# Patient Record
Sex: Female | Born: 1965
Health system: Southern US, Community
[De-identification: ages and names within clinical notes are randomized; demographics above are authoritative.]

## PROBLEM LIST (undated history)

## (undated) DIAGNOSIS — IMO0001 Reserved for inherently not codable concepts without codable children: Secondary | ICD-10-CM

## (undated) DIAGNOSIS — R011 Cardiac murmur, unspecified: Secondary | ICD-10-CM

## (undated) DIAGNOSIS — E785 Hyperlipidemia, unspecified: Secondary | ICD-10-CM

## (undated) DIAGNOSIS — R51 Headache: Secondary | ICD-10-CM

## (undated) DIAGNOSIS — Z972 Presence of dental prosthetic device (complete) (partial): Secondary | ICD-10-CM

## (undated) DIAGNOSIS — F329 Major depressive disorder, single episode, unspecified: Secondary | ICD-10-CM

## (undated) DIAGNOSIS — D649 Anemia, unspecified: Secondary | ICD-10-CM

## (undated) DIAGNOSIS — F419 Anxiety disorder, unspecified: Secondary | ICD-10-CM

## (undated) DIAGNOSIS — M1990 Unspecified osteoarthritis, unspecified site: Secondary | ICD-10-CM

## (undated) DIAGNOSIS — M25561 Pain in right knee: Secondary | ICD-10-CM

## (undated) DIAGNOSIS — T7840XA Allergy, unspecified, initial encounter: Secondary | ICD-10-CM

## (undated) DIAGNOSIS — K279 Peptic ulcer, site unspecified, unspecified as acute or chronic, without hemorrhage or perforation: Secondary | ICD-10-CM

## (undated) DIAGNOSIS — I4891 Unspecified atrial fibrillation: Secondary | ICD-10-CM

## (undated) DIAGNOSIS — R7303 Prediabetes: Secondary | ICD-10-CM

## (undated) DIAGNOSIS — I739 Peripheral vascular disease, unspecified: Secondary | ICD-10-CM

## (undated) DIAGNOSIS — O009 Unspecified ectopic pregnancy without intrauterine pregnancy: Secondary | ICD-10-CM

## (undated) DIAGNOSIS — R519 Headache, unspecified: Secondary | ICD-10-CM

## (undated) DIAGNOSIS — Z973 Presence of spectacles and contact lenses: Secondary | ICD-10-CM

## (undated) DIAGNOSIS — H269 Unspecified cataract: Secondary | ICD-10-CM

## (undated) DIAGNOSIS — F319 Bipolar disorder, unspecified: Secondary | ICD-10-CM

## (undated) DIAGNOSIS — G4700 Insomnia, unspecified: Secondary | ICD-10-CM

## (undated) DIAGNOSIS — F32A Depression, unspecified: Secondary | ICD-10-CM

## (undated) DIAGNOSIS — J189 Pneumonia, unspecified organism: Secondary | ICD-10-CM

## (undated) DIAGNOSIS — K760 Fatty (change of) liver, not elsewhere classified: Secondary | ICD-10-CM

## (undated) HISTORY — DX: Hyperlipidemia, unspecified: E78.5

## (undated) HISTORY — PX: BACK SURGERY: SHX140

## (undated) HISTORY — PX: MULTIPLE TOOTH EXTRACTIONS: SHX2053

## (undated) HISTORY — DX: Anemia, unspecified: D64.9

## (undated) HISTORY — PX: TONSILLECTOMY: SUR1361

## (undated) HISTORY — DX: Peripheral vascular disease, unspecified: I73.9

## (undated) HISTORY — DX: Unspecified atrial fibrillation: I48.91

## (undated) HISTORY — DX: Unspecified ectopic pregnancy without intrauterine pregnancy: O00.90

## (undated) HISTORY — DX: Bipolar disorder, unspecified: F31.9

## (undated) HISTORY — DX: Pain in right knee: M25.561

## (undated) HISTORY — DX: Anxiety disorder, unspecified: F41.9

## (undated) HISTORY — PX: NOSE SURGERY: SHX723

## (undated) HISTORY — DX: Allergy, unspecified, initial encounter: T78.40XA

## (undated) HISTORY — DX: Peptic ulcer, site unspecified, unspecified as acute or chronic, without hemorrhage or perforation: K27.9

## (undated) HISTORY — PX: SHOULDER SURGERY: SHX246

## (undated) HISTORY — PX: TUBAL LIGATION: SHX77

## (undated) HISTORY — DX: Insomnia, unspecified: G47.00

---

## 1998-12-23 ENCOUNTER — Other Ambulatory Visit: Admission: RE | Admit: 1998-12-23 | Discharge: 1998-12-23 | Payer: Self-pay | Admitting: Obstetrics & Gynecology

## 2000-09-06 ENCOUNTER — Ambulatory Visit (HOSPITAL_COMMUNITY): Admission: RE | Admit: 2000-09-06 | Discharge: 2000-09-06 | Payer: Self-pay | Admitting: Gastroenterology

## 2001-04-18 ENCOUNTER — Encounter: Payer: Self-pay | Admitting: Family Medicine

## 2001-04-18 ENCOUNTER — Encounter: Admission: RE | Admit: 2001-04-18 | Discharge: 2001-04-18 | Payer: Self-pay | Admitting: Family Medicine

## 2001-05-30 ENCOUNTER — Other Ambulatory Visit: Admission: RE | Admit: 2001-05-30 | Discharge: 2001-05-30 | Payer: Self-pay | Admitting: Obstetrics & Gynecology

## 2001-07-11 ENCOUNTER — Encounter: Payer: Self-pay | Admitting: Allergy and Immunology

## 2001-07-11 ENCOUNTER — Encounter: Admission: RE | Admit: 2001-07-11 | Discharge: 2001-07-11 | Payer: Self-pay | Admitting: Allergy and Immunology

## 2002-03-15 ENCOUNTER — Ambulatory Visit (HOSPITAL_BASED_OUTPATIENT_CLINIC_OR_DEPARTMENT_OTHER): Admission: RE | Admit: 2002-03-15 | Discharge: 2002-03-15 | Payer: Self-pay | Admitting: Orthopedic Surgery

## 2003-06-29 ENCOUNTER — Encounter: Payer: Self-pay | Admitting: Orthopedic Surgery

## 2003-06-29 ENCOUNTER — Encounter: Admission: RE | Admit: 2003-06-29 | Discharge: 2003-06-29 | Payer: Self-pay | Admitting: Orthopedic Surgery

## 2003-07-27 ENCOUNTER — Encounter: Admission: RE | Admit: 2003-07-27 | Discharge: 2003-07-27 | Payer: Self-pay | Admitting: Orthopedic Surgery

## 2003-07-27 ENCOUNTER — Encounter: Payer: Self-pay | Admitting: Orthopedic Surgery

## 2003-08-01 ENCOUNTER — Ambulatory Visit (HOSPITAL_BASED_OUTPATIENT_CLINIC_OR_DEPARTMENT_OTHER): Admission: RE | Admit: 2003-08-01 | Discharge: 2003-08-01 | Payer: Self-pay | Admitting: Orthopedic Surgery

## 2004-01-14 ENCOUNTER — Ambulatory Visit (HOSPITAL_COMMUNITY): Admission: RE | Admit: 2004-01-14 | Discharge: 2004-01-14 | Payer: Self-pay | Admitting: Gastroenterology

## 2004-01-14 HISTORY — PX: COLONOSCOPY: SHX174

## 2004-01-14 HISTORY — PX: UPPER GASTROINTESTINAL ENDOSCOPY: SHX188

## 2004-01-14 HISTORY — PX: ESOPHAGOGASTRODUODENOSCOPY: SHX1529

## 2004-01-25 ENCOUNTER — Encounter: Admission: RE | Admit: 2004-01-25 | Discharge: 2004-01-25 | Payer: Self-pay | Admitting: Allergy and Immunology

## 2004-09-24 ENCOUNTER — Ambulatory Visit (HOSPITAL_COMMUNITY): Admission: RE | Admit: 2004-09-24 | Discharge: 2004-09-24 | Payer: Self-pay | Admitting: Oral Surgery

## 2005-03-05 ENCOUNTER — Ambulatory Visit (HOSPITAL_BASED_OUTPATIENT_CLINIC_OR_DEPARTMENT_OTHER): Admission: RE | Admit: 2005-03-05 | Discharge: 2005-03-05 | Payer: Self-pay | Admitting: Orthopedic Surgery

## 2005-10-07 ENCOUNTER — Other Ambulatory Visit: Admission: RE | Admit: 2005-10-07 | Discharge: 2005-10-07 | Payer: Self-pay | Admitting: Obstetrics & Gynecology

## 2006-03-11 ENCOUNTER — Encounter: Admission: RE | Admit: 2006-03-11 | Discharge: 2006-03-11 | Payer: Self-pay | Admitting: Orthopedic Surgery

## 2006-04-16 ENCOUNTER — Ambulatory Visit (HOSPITAL_COMMUNITY): Admission: RE | Admit: 2006-04-16 | Discharge: 2006-04-16 | Payer: Self-pay | Admitting: Internal Medicine

## 2006-04-16 ENCOUNTER — Encounter: Payer: Self-pay | Admitting: Vascular Surgery

## 2007-02-08 ENCOUNTER — Emergency Department (HOSPITAL_COMMUNITY): Admission: EM | Admit: 2007-02-08 | Discharge: 2007-02-08 | Payer: Self-pay | Admitting: Family Medicine

## 2007-06-29 ENCOUNTER — Inpatient Hospital Stay (HOSPITAL_COMMUNITY): Admission: RE | Admit: 2007-06-29 | Discharge: 2007-06-30 | Payer: Self-pay | Admitting: Orthopedic Surgery

## 2009-03-05 ENCOUNTER — Encounter
Admission: RE | Admit: 2009-03-05 | Discharge: 2009-06-03 | Payer: Self-pay | Admitting: Physical Medicine & Rehabilitation

## 2009-03-08 ENCOUNTER — Ambulatory Visit: Payer: Self-pay | Admitting: Physical Medicine & Rehabilitation

## 2009-04-09 ENCOUNTER — Ambulatory Visit: Payer: Self-pay | Admitting: Physical Medicine & Rehabilitation

## 2009-08-22 ENCOUNTER — Encounter
Admission: RE | Admit: 2009-08-22 | Discharge: 2009-11-20 | Payer: Self-pay | Admitting: Physical Medicine & Rehabilitation

## 2009-08-26 ENCOUNTER — Ambulatory Visit: Payer: Self-pay | Admitting: Physical Medicine & Rehabilitation

## 2009-09-23 ENCOUNTER — Ambulatory Visit: Payer: Self-pay | Admitting: Physical Medicine & Rehabilitation

## 2009-11-25 ENCOUNTER — Encounter
Admission: RE | Admit: 2009-11-25 | Discharge: 2010-02-23 | Payer: Self-pay | Admitting: Physical Medicine & Rehabilitation

## 2009-11-25 ENCOUNTER — Ambulatory Visit: Payer: Self-pay | Admitting: Physical Medicine & Rehabilitation

## 2010-01-06 ENCOUNTER — Ambulatory Visit: Payer: Self-pay | Admitting: Physical Medicine & Rehabilitation

## 2010-01-20 ENCOUNTER — Ambulatory Visit: Payer: Self-pay | Admitting: Physical Medicine & Rehabilitation

## 2010-02-25 ENCOUNTER — Encounter
Admission: RE | Admit: 2010-02-25 | Discharge: 2010-05-26 | Payer: Self-pay | Admitting: Physical Medicine & Rehabilitation

## 2010-02-27 ENCOUNTER — Ambulatory Visit: Payer: Self-pay | Admitting: Physical Medicine & Rehabilitation

## 2010-03-31 ENCOUNTER — Ambulatory Visit: Payer: Self-pay | Admitting: Physical Medicine & Rehabilitation

## 2010-04-24 ENCOUNTER — Ambulatory Visit: Payer: Self-pay | Admitting: Physical Medicine & Rehabilitation

## 2010-06-09 ENCOUNTER — Encounter
Admission: RE | Admit: 2010-06-09 | Discharge: 2010-06-16 | Payer: Self-pay | Admitting: Physical Medicine & Rehabilitation

## 2010-06-16 ENCOUNTER — Ambulatory Visit: Payer: Self-pay | Admitting: Physical Medicine & Rehabilitation

## 2010-07-04 ENCOUNTER — Encounter
Admission: RE | Admit: 2010-07-04 | Discharge: 2010-10-02 | Payer: Self-pay | Admitting: Physical Medicine & Rehabilitation

## 2010-07-14 ENCOUNTER — Ambulatory Visit: Payer: Self-pay | Admitting: Physical Medicine & Rehabilitation

## 2010-10-09 ENCOUNTER — Encounter
Admission: RE | Admit: 2010-10-09 | Discharge: 2010-10-09 | Payer: Self-pay | Source: Home / Self Care | Attending: Physical Medicine & Rehabilitation | Admitting: Physical Medicine & Rehabilitation

## 2010-10-31 ENCOUNTER — Ambulatory Visit: Payer: Self-pay | Admitting: Physical Medicine & Rehabilitation

## 2011-04-07 NOTE — Op Note (Signed)
NAMEGAILE, ALLMON NO.:  [id number]   MEDICAL RECORD NO.:  [id number]          PATIENT TYPE:  OIB   LOCATION:  1610                         FACILITY:  [facility]   PHYSICIAN:  Marlowe Kays, M.D.  DATE OF BIRTH:  Nov 23, 1966   DATE OF PROCEDURE:  06/28/2007  DATE OF DISCHARGE:                               OPERATIVE REPORT   PREOPERATIVE DIAGNOSIS:  Disk bulges, L3-4 and L4-5, with spinal  stenosis at both levels.   POSTOPERATIVE DIAGNOSIS:  Disk bulges, L3-4 and L4-5, with spinal  stenosis at both levels.   OPERATION:  Central foraminal decompression, L3-4 and L4-5.   SURGEON:  Marlowe Kays, M.D.   ASSISTANT:  Georges Lynch. Darrelyn Hillock, M.D.   ANESTHESIA:  General.   PATHOLOGY AND JUSTIFICATION FOR PROCEDURE:  She had a work-related  injury last fall when she slipped and fell.  She has had an injury to  her right shoulder, also has had continued pain into both buttocks and  right thigh initially, subsequently has had some weakness and  dorsiflexion of her right foot.  An MRI has demonstrated disk bulges at  L3-4 and L4-5 with a suggestive of spinal stenosis. A myelogram  performed on May 02, 2007 has demonstrated spinal stenosis at both L3-4  and at L4-5 with poor filling of the axillary root sleeves at L3-4 and  at L4-5.  It was felt that the 2-level decompression of these 2 levels  was indicated based on the myelographic findings and her clinical  picture.   PROCEDURE:  Prophylactic antibiotics, satisfactory general anesthesia,  knee-chest position on the Thomas frame.  With 3 spinal needles,  following a Betadine prep and lateral x-ray, I tentatively located the  L3-4 and L4-5 interspaces.  We then continued draping the back in a  sterile field, time-out performed, Ioban employed, midline incision down  to the 2 spinous processes in the depth of the wound, which were tagged,  and second lateral x-ray confirming that we were at L3 and at L4.  Consequently, I extended the incision somewhat caudad to expose the L5  spinous process as well.  Soft tissue was dissected off the neural  arches from L3 to L5 and 2 self-retaining McCullough retractors placed.  With a double-action rongeur, I removed the spinous process of L4 and a  portion of L3 and L5; I also removed a good bit of the neural arches in  this interval as well.  I then used a combination of 2- and 3-mm  Kerrison rongeurs to begin removing bone and ligamentum flavum and when  the dissection became more difficult, we brought in the microscope to  complete the decompression.  At the conclusion of the decompression, a  hockey-stick was easily placed, both cephalad beneath L3 and caudad  beneath the L5, and the foramina on both sides were widely patent to the  hockey-stick.  Wound was irrigated.  There was no dural tear or other  known complication.  Wound was irrigated with sterile saline and Gelfoam  soaked in thrombin was placed over the dura.  Self-retaining retractors  were removed.  She  was given of 30 mg of Toradol and the wound closed  with interrupted #1 Vicryl in the fascia and paralumbar muscle and deep  subcutaneous tissue, 2-0 Vicryl in the superficial  subcutaneous tissue and staples in the skin.  A dry sterile dressing was  applied.  She was gently placed on her PACU bed and taken to Recovery in  satisfactory condition with no known complications.  Estimated blood  loss was perhaps 150-200 mL, no blood replacement.           ______________________________  Marlowe Kays, M.D.     JA/MEDQ  D:  06/28/2007  T:  06/29/2007  Job:  [id number]
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
## 2011-04-07 NOTE — Assessment & Plan Note (Signed)
Felicia [name] was originally scheduled for an EMG/NCV.  I had her set up  for the test, and she informed me that she actually had an  electrodiagnostic test done recently at another facility.   Felicia [name] also informs me that she is getting rotator cuff surgery or  some type of shoulder surgery by Dr. Ranell Patrick.   In view of the above, I really do not think I have much to offer her.  I  will see her back on a p.r.n. basis.  Overall, I do not think she is a  good candidate for narcotic analgesics on a chronic basis given the  risk/benefit ratio with a past history of bipolar disorder and overall  poor functional outcome in the Workers Comp situation.  Once again, I do  agree with her previously determined MMI status, permanent  restrictions.      Erick Colace, M.D.  Electronically Signed     AEK/MedQ  D:  04/09/2009 15:37:03  T:  04/10/2009 08:11:33  Job #:  [id number]   cc:   Francee Piccolo  [id number].  [place], [place] [address]   Almedia Balls. Ranell Patrick, M.D.  Fax: [phone]   Roe Coombs  [phone]

## 2011-04-10 NOTE — Op Note (Signed)
Felicia [name], Felicia [name]              ACCOUNT NO.:  [id number]   MEDICAL RECORD NO.:  [id number]          PATIENT TYPE:  AMB   LOCATION:  DSC                          FACILITY:  [facility]   PHYSICIAN:  Katy Fitch. Sypher Montez Hageman., M.D.DATE OF BIRTH:  [birth year]   DATE OF PROCEDURE:  03/05/2005  DATE OF DISCHARGE:                                 OPERATIVE REPORT   PREOP DIAGNOSIS:  Chronic left rotator cuff tear involving supraspinatus and  infraspinatus tendons, MRI proven.   POSTOP DIAGNOSIS:  Chronic left rotator cuff tear involving supraspinatus  and infraspinatus tendons, MRI proven, with identification of extensive  synovitis within left glenohumeral joint, rule out an underlying  inflammatory disorder leading to chronic shoulder pain in addition to a  rotator cuff tear predicament.   OPERATION:  1.  Diagnostic arthroscopy, left shoulder followed by arthroscopic      synovectomy and debridement of labral degenerative fragments and deep      surface rotator cuff debridement.  2.  Open reconstruction of supraspinatus and infraspinatus, chronically      retracted tear with decortication of greater tuberosity and placement of      two Arthrex 5.5 mm BioCorkscrew anchors and use of three McLaughlin      through bone mattress sutures to reinsert the supraspinatus and      infraspinatus to the decorticated and lower profile greater tuberosity.      Also, incidental anterolateral acromioplasty. The [facility] joint was      identified and found to be not part of the pathology in my judgment.   OPERATING SURGEON:  Lovey Newcomer, M.D.   ASSISTANT:  Despina Arias, PA-C.   ANESTHESIA:  General endotracheal supplemented by interscalene block,  supervising anesthesiologist Dr. Jacklynn Bue.   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   She has had an extensive history  of rotator cuff disease on the right  shoulder having undergone three repairs of the right rotator cuff in  [place], followed by reconstruction at [facility] for  a total of four procedures.   This would suggest that she has a significant underlying physiologic problem  in her rotator cuff and indeed after lifting during her rehabilitation she  had pain on the left side that was evaluated by her primary care physician  with an MRI documenting a left rotator cuff tear.   She was referred for reconstruction of her left shoulder with our office.   Preoperatively, medical inventory revealed that she had chronic asthma and  was on routine steroid use as an inhaler and had used prednisone  episodically in the past. Despite her chronic obstructive airways disease,  she continues to be a smoker.   These are three significant risk factors for healing.   In general, Felicia [name] appears to be physiologically old for her stated age  and has demonstrated healing problems with her past right shoulder  experience.   Preoperatively, I had a very detailed and frank informed consent in  which I  advised her that in my judgment she should stop smoking and would probably  be at some risk for a breakdown of this repair if she has some type of  underlying problem with her collagen healing due to her asthma and chronic  steroid use.   After carefully highlighting these issues and requesting that she stop  smoking as well as plan on immobilizing herself immediately following  surgery in an effort to prophylax deep vein thrombosis, she is brought to  the operating at this time.   Preoperatively, questions were invited and answered.   PROCEDURE IN DETAIL:  Felicia [name] was brought to operating room and  placed in supine position on the operating table.   Following anesthesia consultation by Dr. Jacklynn Bue, an interscalene block was  placed without complication.   She was  transferred to room eight, placed in supine position on the  operating table, and underwent general orotracheal anesthesia under the  supervision of Dr. Jacklynn Bue and Dr. Gelene Mink.   She was carefully positioned in the beach-chair position with the aid of a  torso and head holder designed for short arthroscopy, followed by  administration of 1 gram of Ancef as IV prophylactic antibiotic.   The left upper extremity and shoulder were prepped with DuraPrep and draped  with impervious arthroscopy drapes.   The shoulder was distended with 20 cc of sterile saline placed through a 20  gauge spinal needle anteriorly. The scope was placed through a standard  posterior viewing portal. Diagnostic arthroscopy revealed abundant synovitis  consistent with a rheumatoid type arthritis in addition to the rotator cuff  tear. This did not appear to be garden variety adhesive capsulitis. There  was extensive degenerative change of the labrum. There was a small partial  biceps tear adjacent to its origin. An anterior portal was created under  direct vision followed by placement of 4.5-mm suction shaver to perform  synovectomy and debridement of the biceps. The deep surface of the rotator  cuff was inspected and found to have a large retracted tear.   The synovium was electrocauterized with an ArthroCare wand brought in  anteriorly followed by use of a suction shaver to debride the cauterized  synovium. Photographic documentation of the synovitis was accomplished with  a digital camera.   The supraspinatus was inspected and found to have a rather large retracted  tear. There was a large bare area noted with granulation tissues present.   The scope was removed and we proceeded directly to an open repair.   A 3 cm incision was fashioned across the anterior acromion and splitting the  deltoid 1.5 cm laterally. The muscle was split at the midline between the anterior middle thirds of the deltoid down to the  bursa which was incised  with scissors. The coracoacromial ligament was left intact. The deep surface  of the [facility] joint was palpated and inspected visually and found not to be  prominent. The coracoacromial ligament did not appear to be a factor in the  cuff tear process. This appeared to be a primary tear due to the  degenerative disease of the cuff and or synovitis in the glenohumeral joint.   The greater tuberosity was decorticated from the biceps interval posteriorly  over 3.5 cm to the deep surface of the teres minor. The margins of the  infraspinatus and supraspinatus were sharply debrided and three McLaughlin  mattress sutures were placed with grasping technique. The McLaughlin sutures  were placed through bone drill  holes tunneling to allow a strong cortical  repair laterally. To BioCorkscrew anchors were placed, one at the middle  section of the supraspinatus and one at the junction of the infraspinatus  and supraspinatus.   A total of four mattress sutures were placed with the BioCorkscrew anchors  after advancing the cuff laterally insetting into the decorticated greater  tuberosity. The mattress sutures from the corkscrew anchors were used to  create a broad footprint of repair against the tuberosity.   A very satisfactory low profile repair was achieved.   The anterior third of the deltoid was then meticulously repaired to the  acromion with through bone through periosteum sutures of #2 FiberWire  followed by repair of the deltoid split with mattress suture of 0-Vicryl.  Satisfactory range of motion was achieved.   The wound was then repaired with subdermal sutures of 2-0 Vicryl and  intradermal 3-0 Prolene suture.   The repair was completed under moderate tension, however, the McLaughlin  sutures were tied over firm cortical bone and should be able to maintain the  advancement of the tendon.   IMPRESSION:  My impression after completing this procedure was is that Ms.   [name] is physiologically older that her stated chronological age with  tissue quality issues probably related to steroids and her chronic smoking.   We may be challenged to obtain a satisfactory healing although our construct  at this time appears to be quite sound.   For aftercare, she will be admitted to the recovery care center for  observation of vital signs and IV antibiotics in the form of intravenous  Ancef 1 gram q.8h x3 doses and appropriate analgesics in the form of p.o.  and intravenous Dilaudid.      RVS/MEDQ  D:  03/05/2005  T:  03/05/2005  Job:  [id number]

## 2011-04-10 NOTE — Op Note (Signed)
[place]  Patient:    DEVITA, NIES Visit Number: [id number] MRN: [id number]          Service Type: DSU Location: [facility] Attending Physician:  Georgena Spurling Dictated by:   Georgena Spurling, M.D. Proc. Date: 03/15/02 Admit Date:  03/15/2002 Discharge Date: 03/15/2002                             Operative Report  PREOPERATIVE DIAGNOSIS:  Right shoulder rotator cuff tear with shoulder impingement and possible intra-articular pathology.  POSTOPERATIVE DIAGNOSIS:  Right shoulder rotator cuff tear with shoulder impingement and possible intra-articular pathology.  OPERATION PERFORMED:  Right shoulder arthroscopy with glenohumeral debridement of frayed labral tearing and undersurface rotator cuff tearing.  Subacromial decompression and miniopen rotator cuff repair.  SURGEON:  Georgena Spurling, M.D.  ASSISTANT:  Jamelle Rushing, P.A.  ANESTHESIA:  General plus interscalene block.  INDICATIONS FOR PROCEDURE:  The patient is a 45 year old white female who has MRI evidence of rotator cuff tearing and has failed conservative treatment. Informed consent was obtained.  DESCRIPTION OF PROCEDURE:  The patient was given an interscalene block in th4e preanesthesia holding area and taken to the operating room and placed in the supine position and administered general LMA anesthesia.  She was then placed in a beach chair position.  The right shoulder was prepped and draped in the usual sterile fashion.  Anterior and posterior portals were created with a #11 blade, blunt trocar and cannula.  Glenohumeral debridement was performed. There was some undersurface tearing of the rotator cuff that was obviously full thickness but better accessible from inside the joint.  We then debrided some frayed labrum and biceps anchor as well.  I then closed the anterior portal and went from the posterior portal up to the subacromial space and created a direct lateral portal  with a #11 blade, blunt trocar and cannula. Performed a bursectomy with a shaver and then an anterolateral acromioplasty with the 5.0 cylindrical bur.  I then debrided the rotator cuff and converted to a miniopen technique.  We used a 15 blade to extend it up to the anterolateral corner of the acromion, split the deltoid in line with its raphe, held in place with an Arthrex shoulder retractor and completed our far lateral bursectomy.  At this point I grabbed the rotator cuff with stay sutures with 0 Vicryls, then used two 5.0 mm Biocorkscrew anchors from Arthrex and placed them in the bare area of the humerus where I had debrided a trough with the arthroscopic bur.  I then placed two vertical mattress and two simple sutures and with some tension, pulled with the stay sutures and tied them down.  I then placed a third anchor in the far posterior portion of the tear and placed a simple and a vertical mattress in that as well.  I then irrigated, closed the deltoid interval with 0 Vicryl, the subcuticular layer with 2-0 Vicryls and Steri-Strips there and nylons in the portals.  Dressed with Adaptic, 4 x 4s, ABDs, 2-inch silk tape, sling and swath.  The patient tolerated the procedure well.  COMPLICATIONS:  None.  DRAINS:  ESTIMATED BLOOD LOSS:  Minimal. Dictated by:   Georgena Spurling, M.D. Attending Physician:  Georgena Spurling DD:  03/15/02 TD:  03/15/02 Job: [id number]

## 2011-04-10 NOTE — Op Note (Signed)
NAME:  Felicia [name], Felicia [name]                        ACCOUNT NO.:  [id number]   MEDICAL RECORD NO.:  [id number]                   PATIENT TYPE:  AMB   LOCATION:  ENDO                                 FACILITY:  [facility]   PHYSICIAN:  Anselmo Rod, M.D.               DATE OF BIRTH:  [birth year]   DATE OF PROCEDURE:  01/14/2004  DATE OF DISCHARGE:                                 OPERATIVE REPORT   PROCEDURE PERFORMED:  Esophagogastroduodenoscopy.   ENDOSCOPIST:  Charna Elizabeth, M.D.   INSTRUMENT USED:  Olympus video panendoscope.   INDICATIONS FOR PROCEDURE:  [age]-year-old white female with a history  of diffuse abdominal pain and guaiac positive stool.  Rule out peptic ulcer  disease, esophagitis, gastritis, etc.   PROCEDURE PREPARATION:  Informed consent was procured from the patient.  The  patient was fasted for eight hours prior to the procedure.   PREPROCEDURE PHYSICAL EXAMINATION:  VITAL SIGNS:  The patient has stable  vital signs.  NECK:  Supple.  CHEST:  Clear to auscultation.  HEART:  S1 and S2 regular.  ABDOMEN:  Soft with normal bowel sounds.   DESCRIPTION OF PROCEDURE:  The patient was placed in the left lateral  decubitus position and sedated with 100 mg of Demerol and 7 mg of Versed in  slow incremental doses.  Once the patient was adequately sedated and  maintained on low flow oxygen and continuous cardiac monitoring, the Olympus  video panendoscope was advanced through the mouthpiece, over the tongue,  into the esophagus under direct vision.  The entire esophagus appeared  normal with no evidence of ring, stricture, masses, esophagitis or Barrett's  mucosa.  The scope was then advanced to the stomach.  Mild atrophic  gastritis was noted.  No erosions, masses, ulcers or polyps were seen.  The  proximal small bowel appeared normal.   IMPRESSION:  Essentially normal esophagogastroduodenoscopy except for mild  atrophic gastritis.   RECOMMENDATIONS:  Proceed with  colonoscopy at this time.  Further  recommendations will be made thereafter.                                               Anselmo Rod, M.D.   JNM/MEDQ  D:  01/14/2004  T:  01/15/2004  Job:  [id number]   cc:   Darl Pikes _______, F.N.P.   [facility]

## 2011-04-10 NOTE — Op Note (Signed)
NAME:  Felicia [name], Felicia [name]                        ACCOUNT NO.:  [id number]   MEDICAL RECORD NO.:  [id number]                   PATIENT TYPE:  AMB   LOCATION:  ENDO                                 FACILITY:  [facility]   PHYSICIAN:  Anselmo Rod, M.D.               DATE OF BIRTH:  03/17/1966   DATE OF PROCEDURE:  01/14/2004  DATE OF DISCHARGE:                                 OPERATIVE REPORT   PROCEDURE:  Colonoscopy.   ENDOSCOPIST:  Charna Elizabeth, M.D.   INSTRUMENT USED:  Olympus video colonoscope.   INDICATIONS FOR PROCEDURE:  Diffuse abdominal pain and guaiac-positive stool  in a 45 year old white female to rule out colonic polyps, masses, etc.   PROCEDURE PERFORMED:  Informed consent was procured from the patient.  The  patient fasted for eight hours prior to the procedure and prepped with a  bottle of magnesium citrate and a gallon of GOLYTELY the night prior to the  procedure.   PREPROCEDURE PHYSICAL EXAMINATION:  VITAL SIGNS:  The patient had stable  vital signs.  NECK:  Supple.  CHEST:  Clear to auscultation.  HEART:  S1 and S2 regular.  ABDOMEN:  Soft with normal bowel sounds.   DESCRIPTION OF PROCEDURE:  The patient was placed in the left lateral  decubitus position, sedated with and additional 20 mg of Demerol and 2 mg of  Versed intravenously.  Once the patient was adequately sedated and  maintained on low flow oxygen and continuous cardiac monitoring, the Olympus  video colonoscope was advanced from the rectum to the cecum and terminal  ileum.  The appendiceal orifice and ileocecal valve were clearly visualized  and photographed.  No masses, polyps, erosions, ulcerations or diverticula  were seen.  The patient had some abdominal discomfort with insufflation of  air into the colon indicating hypersensitivity.  Small internal hemorrhoids  were seen on retroflexion.  The terminal ileum appeared healthy and without  lesions.   IMPRESSION:  1. Normal colonoscopy to the  terminal ileum except for small internal     hemorrhoids.  2. Hypersensitivity noted with insufflation of air into the colon indicating     a component of irritable bowel syndrome.   RECOMMENDATIONS:  1. Continue a high diet with liberal fluid intake.  2. Use antispasmodics as needed.  3. Repeat guaiac testing on an outpatient basis.  4. Repeat CRC screening at the age of 86 and if the patient develops any     abnormal symptoms in the interim.  5. Outpatient followup in the next two weeks for further recommendations.                                               Anselmo Rod, M.D.    JNM/MEDQ  D:  01/14/2004  T:  01/15/2004  Job:  [id number]   cc:   Tomi Bamberger, F.N.P.   [employer]

## 2011-04-10 NOTE — Op Note (Signed)
Felicia [name], Felicia [name] NO.:  [id number]   MEDICAL RECORD NO.:  [id number]          PATIENT TYPE:  OIB   LOCATION:  2899                         FACILITY:  [facility]   PHYSICIAN:  Hewitt Blade, D.D.S.DATE OF BIRTH:  [birth year]   DATE OF PROCEDURE:  09/24/2004  DATE OF DISCHARGE:  09/24/2004                                 OPERATIVE REPORT   PREOPERATIVE DIAGNOSIS:  Nonrestorable and decayed teeth numbers 3, 12, 14,  20, 31.  History of severe asthma and breathing difficulties.   POSTOPERATIVE DIAGNOSIS:  Nonrestorable and decayed teeth numbers 3, 12, 14,  20, 31.  History of severe asthma and breathing difficulties.   OPERATION PERFORMED:  Removal of the above dentition.   SURGEON:  Hewitt Blade, D.D.S.   ASSISTANT:  Konkel.   ANESTHESIA:  General via oral endotracheal intubation.   ESTIMATED BLOOD LOSS:  Less than 25 mL.   FLUIDS REPLACED:  Approximately 1000 mL crystalloid solutions.   COMPLICATIONS:  None apparent.   INDICATIONS FOR PROCEDURE:  [name] is a 45 year old female who was  referred to my office for removal of the deeply decayed teeth.  The teeth  require surgical extraction and due to the patient's history of severe  breathing problems.  It was recommended that the procedure be performed  under general anesthesia in an operating room setting.   DESCRIPTION OF PROCEDURE:  On September 24, 2004, [name] was taken to  [facility] major operating suite where she was placed on the operating room table  in supine position.  Following successful oral endotracheal intubation and  general anesthesia, the patient's face, neck and oral cavity were prepped  and draped in the usual sterile operating room fashion.  The hypopharynx was  suctioned free of fluids and secretions and a moistened 2 inch vaginal pack  was placed as a throat pack.   Attention was then directed intraorally, where approximately 8 mL of 0.5%  Xylocaine containing 1:200,000  epinephrine were infiltrated in the buccal  and palatal gingival tissues around the maxillary teeth and the right and  left inferior alveolar neurovascular regions.  A full thickness  mucoperiosteal flap was then created around the teeth using a #15 Bard  Parker blade.  A flap was elevated approximately 0.5 cm.  The teeth were  then removed from the oral cavity with subluxation from an 11A elevator and  removed using 150 dental forceps.  The bony margin then smoothed using a  Stryker rotary osteotome and a #8 round bur.  The surgical sites were  thoroughly irrigated with  sterile saline irrigating solution and suctioned.  Mucoperiosteal margins  were approximated and sutured in an interrupted fashion using 4-0 chromic  suture material.  The patient was allowed to awaken following removal of the  throat pack and taken to the recovery room without apparent complication.       DC/MEDQ  D:  10/28/2004  T:  10/29/2004  Job:  [id number]

## 2011-06-16 ENCOUNTER — Other Ambulatory Visit: Payer: Self-pay | Admitting: Family Medicine

## 2011-06-16 DIAGNOSIS — R0989 Other specified symptoms and signs involving the circulatory and respiratory systems: Secondary | ICD-10-CM

## 2011-06-17 ENCOUNTER — Encounter: Payer: Self-pay | Admitting: Family Medicine

## 2011-06-17 DIAGNOSIS — K279 Peptic ulcer, site unspecified, unspecified as acute or chronic, without hemorrhage or perforation: Secondary | ICD-10-CM | POA: Insufficient documentation

## 2011-06-17 DIAGNOSIS — E785 Hyperlipidemia, unspecified: Secondary | ICD-10-CM | POA: Insufficient documentation

## 2011-06-19 ENCOUNTER — Ambulatory Visit
Admission: RE | Admit: 2011-06-19 | Discharge: 2011-06-19 | Disposition: A | Discharge status: Home / Self Care | Payer: Medicare Other | Source: Ambulatory Visit | Attending: Family Medicine | Admitting: Family Medicine

## 2011-06-19 DIAGNOSIS — R0989 Other specified symptoms and signs involving the circulatory and respiratory systems: Secondary | ICD-10-CM

## 2011-09-07 LAB — ABO/RH: ABO/RH(D): A POS

## 2011-09-07 LAB — PROTIME-INR: Prothrombin Time: 12.3

## 2011-09-07 LAB — PREGNANCY, URINE: Preg Test, Ur: NEGATIVE

## 2011-09-07 LAB — TYPE AND SCREEN: Antibody Screen: NEGATIVE

## 2011-11-10 ENCOUNTER — Ambulatory Visit: Payer: Medicare Other | Admitting: Physical Therapy

## 2011-11-25 ENCOUNTER — Ambulatory Visit: Payer: Medicare Other | Admitting: Physical Therapy

## 2012-05-03 ENCOUNTER — Other Ambulatory Visit: Payer: Self-pay | Admitting: Family Medicine

## 2012-05-03 DIAGNOSIS — R51 Headache: Secondary | ICD-10-CM

## 2012-05-05 ENCOUNTER — Ambulatory Visit
Admission: RE | Admit: 2012-05-05 | Discharge: 2012-05-05 | Disposition: A | Discharge status: Home / Self Care | Payer: Medicare Other | Source: Ambulatory Visit | Attending: Family Medicine | Admitting: Family Medicine

## 2012-05-05 DIAGNOSIS — R51 Headache: Secondary | ICD-10-CM

## 2012-05-05 MED ORDER — GADOBENATE DIMEGLUMINE 529 MG/ML IV SOLN
13.0000 mL | Freq: Once | INTRAVENOUS | Status: AC | PRN
  Administered 2012-05-05: 13 mL via INTRAVENOUS

## 2012-06-10 ENCOUNTER — Other Ambulatory Visit: Payer: Self-pay

## 2012-06-10 DIAGNOSIS — I70219 Atherosclerosis of native arteries of extremities with intermittent claudication, unspecified extremity: Secondary | ICD-10-CM

## 2012-07-12 ENCOUNTER — Encounter: Payer: Self-pay | Admitting: Vascular Surgery

## 2012-07-13 ENCOUNTER — Encounter (INDEPENDENT_AMBULATORY_CARE_PROVIDER_SITE_OTHER): Payer: BC Managed Care – PPO | Admitting: *Deleted

## 2012-07-13 ENCOUNTER — Ambulatory Visit (INDEPENDENT_AMBULATORY_CARE_PROVIDER_SITE_OTHER): Payer: BC Managed Care – PPO | Admitting: Vascular Surgery

## 2012-07-13 ENCOUNTER — Encounter: Payer: Self-pay | Admitting: Vascular Surgery

## 2012-07-13 VITALS — BP 145/61 | HR 78 | Resp 20 | Ht <= 58 in | Wt 137.0 lb

## 2012-07-13 DIAGNOSIS — I739 Peripheral vascular disease, unspecified: Secondary | ICD-10-CM

## 2012-07-13 DIAGNOSIS — I70219 Atherosclerosis of native arteries of extremities with intermittent claudication, unspecified extremity: Secondary | ICD-10-CM

## 2012-07-13 DIAGNOSIS — Z01810 Encounter for preprocedural cardiovascular examination: Secondary | ICD-10-CM

## 2012-07-13 NOTE — Addendum Note (Signed)
Addended by: Sharee Pimple on: 07/13/2012 01:10 PM   Modules accepted: Orders

## 2012-07-13 NOTE — Assessment & Plan Note (Signed)
Based on the markedly diminished ABIs bilaterally and her physical examination, she has evidence of multilevel arterial occlusive disease. I am unable to palpate femoral pulses and she clearly has aortoiliac occlusive disease. I have discussed with the patient the nature of atherosclerosis, and emphasized the importance of maximal medical management including control of blood pressure, cholesterol levels, antiplatelet agents, obtaining regular exercise, and cessation of smoking. The patient is aware that without maximal medical management the underlying atherosclerotic disease process will progress and also limit the benefit of any interventions. [facility] discussed the importance of a structured walking program however she can only walk a very short distance at this point. I have recommended proceed with CT arteriography to further evaluate the extent of her disease. If it appears that she has disease amenable to angioplasty and stenting then we could potentially consider this. If she is not a candidate for this and would require surgery I would favor an attempt at any conservative approach given that she would be at increased risk for aortofemoral bypass grafting given her obesity. I'll see her back after her CT angiogram.

## 2012-07-13 NOTE — Progress Notes (Signed)
Vascular and Vein Specialist of   Patient name: Felicia [name] MRN: [id number] DOB: [birth year] Sex: female  REASON FOR CONSULT: peripheral vascular disease. Referred by Dr. Georgena Spurling.  HPI: Felicia [name] is a 46 y.o. female was been having significant bilateral lower extremity pain. She experiences pain in her hips thighs and calves bilaterally which is brought on by ambulation and relieved with rest. This has been occurring for over a year. Her symptoms have gradually gotten worse. She also experiences paresthesias in both lower extremities. She denies any history of rest pain and she has had no history of nonhealing wounds. She states that she can barely walk halfway up her driveway before having to stop. He is unable to go grocery shopping with her husband because of her claudication symptoms. She has been worked up for back pain and does have some mild lumbar disc disease.  I reviewed her lumbar spine x-rays which do show some calcium in her aorta.  Past Medical History  Diagnosis Date  . Asthma   . PUD (peptic ulcer disease)   . Anxiety   . Bipolar 1 disorder   . Insomnia   . Hyperlipidemia     Family History  Problem Relation Age of Onset  . Heart disease Mother   . Diabetes Mother   . Hyperlipidemia Mother   . Heart disease Father   . Diabetes Father   . Diabetes Brother     SOCIAL HISTORY: History  Substance Use Topics  . Smoking status: Current Everyday Smoker -- 0.5 packs/day for 30 years    Types: Cigarettes  . Smokeless tobacco: Never Used  . Alcohol Use: No    Allergies  Allergen Reactions  . Cephalosporins   . Codeine Nausea Only  . Doxycycline Hives  . Tetracyclines & Related   . Reflex Pain Relief (Menthol) Rash    Current Outpatient Prescriptions  Medication Sig Dispense Refill  . clonazePAM (KLONOPIN) 1 MG tablet Take 1 mg by mouth 3 (three) times daily as needed.        . cyclobenzaprine (FLEXERIL) 10 MG tablet Take 10 mg by mouth  3 (three) times daily as needed.      . divalproex (DEPAKOTE) 500 MG 24 hr tablet Take 500 mg by mouth daily.        . pravastatin (PRAVACHOL) 80 MG tablet Take 80 mg by mouth daily.      . traMADol (ULTRAM) 50 MG tablet Take 50 mg by mouth every 6 (six) hours as needed.          REVIEW OF SYSTEMS: Felicia [name] ] denotes positive finding; [  ] denotes negative finding  CARDIOVASCULAR:  [ ]  chest pain   [ ]  chest pressure   [ ]  palpitations   [ ]  orthopnea   [ ]  dyspnea on exertion   Felicia [name] ] claudication   Felicia [name] ] rest pain   [ ]  DVT   [ ]  phlebitis PULMONARY:   [ ]  productive cough   Felicia [name] ] asthma   Felicia [name] ] wheezing NEUROLOGIC:   [ ]  weakness  [ ]  paresthesias  [ ]  aphasia  [ ]  amaurosis  [ ]  dizziness HEMATOLOGIC:   [ ]  bleeding problems   [ ]  clotting disorders MUSCULOSKELETAL:  [ ]  joint pain   [ ]  joint swelling [ ]  leg swelling GASTROINTESTINAL: [ ]   blood in stool  [ ]   hematemesis GENITOURINARY:  [ ]   dysuria  [ ]   hematuria PSYCHIATRIC:  [ ]   history of major depression INTEGUMENTARY:  [ ]  rashes  [ ]  ulcers CONSTITUTIONAL:  [ ]  fever   [ ]  chills  PHYSICAL EXAM: Filed Vitals:   07/13/12 1202  BP: 145/61  Pulse: 78  Resp: 20  Height: 4\' 9"  (1.448 m)  Weight: 137 lb (62.143 kg)   Body mass index is 29.65 kg/(m^2). GENERAL: The patient is a well-nourished female, in no acute distress. The vital signs are documented above. CARDIOVASCULAR: There is a regular rate and rhythm without significant murmur appreciated. I do not detect carotid bruits. I cannot palpate femoral pulses. I cannot palpate popliteal or pedal pulses. She has no significant lower extremity swelling. PULMONARY: There is good air exchange bilaterally without wheezing or rales. ABDOMEN: Soft and non-tender with normal pitched bowel sounds. I do not palpate an abdominal aortic aneurysm although it is difficult to evaluate because of her obesity. MUSCULOSKELETAL: There are no major deformities or cyanosis. NEUROLOGIC: No focal weakness  or paresthesias are detected. SKIN: There are no ulcers or rashes noted. PSYCHIATRIC: The patient has a normal affect.  DATA:  I have independently interpreted her arterial Doppler study which shows an ABI of 33% on the right and 29% on the left. She has a monophasic posterior tibial and dorsalis pedis signal bilaterally. I've also interpreted her duplex scan which shows monophasic waveforms in both common femoral arteries. She has turbulent waveforms in the external iliac arteries bilaterally suggestive of proximal disease. This would fit with the finding of calcific disease in her aorta seen on plain films of her spine.  I have reviewed the records from Dr. Tobin Chad office. She does not feel that she has significant disease of her back which would explain her symptoms.  MEDICAL ISSUES:  Peripheral vascular disease Based on the markedly diminished ABIs bilaterally and her physical examination, she has evidence of multilevel arterial occlusive disease. I am unable to palpate femoral pulses and she clearly has aortoiliac occlusive disease. I have discussed with the patient the nature of atherosclerosis, and emphasized the importance of maximal medical management including control of blood pressure, cholesterol levels, antiplatelet agents, obtaining regular exercise, and cessation of smoking. The patient is aware that without maximal medical management the underlying atherosclerotic disease process will progress and also limit the benefit of any interventions. [facility] discussed the importance of a structured walking program however she can only walk a very short distance at this point. I have recommended proceed with CT arteriography to further evaluate the extent of her disease. If it appears that she has disease amenable to angioplasty and stenting then we could potentially consider this. If she is not a candidate for this and would require surgery I would favor an attempt at any conservative approach  given that she would be at increased risk for aortofemoral bypass grafting given her obesity. I'll see her back after her CT angiogram.     DICKSON,CHRISTOPHER S [facility] [place] [facility]per: [phone]

## 2012-07-26 ENCOUNTER — Encounter: Payer: Self-pay | Admitting: Vascular Surgery

## 2012-07-27 ENCOUNTER — Ambulatory Visit (INDEPENDENT_AMBULATORY_CARE_PROVIDER_SITE_OTHER): Payer: Medicare Other | Admitting: Vascular Surgery

## 2012-07-27 ENCOUNTER — Encounter: Payer: Self-pay | Admitting: Vascular Surgery

## 2012-07-27 ENCOUNTER — Ambulatory Visit
Admission: RE | Admit: 2012-07-27 | Discharge: 2012-07-27 | Disposition: A | Discharge status: Home / Self Care | Payer: Medicare Other | Source: Ambulatory Visit | Attending: Vascular Surgery | Admitting: Vascular Surgery

## 2012-07-27 VITALS — BP 134/58 | HR 89 | Resp 16 | Ht <= 58 in | Wt 137.0 lb

## 2012-07-27 DIAGNOSIS — Z01810 Encounter for preprocedural cardiovascular examination: Secondary | ICD-10-CM

## 2012-07-27 DIAGNOSIS — I739 Peripheral vascular disease, unspecified: Secondary | ICD-10-CM

## 2012-07-27 MED ORDER — IOHEXOL 350 MG/ML SOLN
150.0000 mL | Freq: Once | INTRAVENOUS | Status: AC | PRN
  Administered 2012-07-27: 150 mL via INTRAVENOUS

## 2012-07-27 MED ORDER — CLOPIDOGREL BISULFATE 75 MG PO TABS: 75.0000 mg | ORAL_TABLET | Freq: Every day | ORAL | Status: DC

## 2012-07-27 NOTE — Progress Notes (Signed)
Vascular and Vein Specialist of [facility]  Patient name: Felicia [name] MRN: [id number] DOB: 18-Apr-1966 Sex: female  REASON FOR VISIT: follow up of multilevel arterial occlusive disease.  HPI: Felicia [name] is a 46 y.o. female who I saw in consultation on 07/13/2012. She had absent femoral pulses and evidence of multilevel arterial occlusive disease. She was set up for CT angiogram and comes in to discuss these results. She continued to have significant claudication of both lower charities. She gets claudication a very short distance and cannot even grocery shop because of this. She does state that since her last visit she has put away her cigarettes.   REVIEW OF SYSTEMS: [name] ] denotes positive finding; [  ] denotes negative finding  CARDIOVASCULAR:  [ ]  chest pain   [ ]  dyspnea on exertion    CONSTITUTIONAL:  [ ]  fever   [ ]  chills  PHYSICAL EXAM: Filed Vitals:   07/27/12 1439  BP: 134/58  Pulse: 89  Resp: 16  Height: 4\' 9"  (1.448 m)  Weight: 137 lb (62.143 kg)  SpO2: 100%   Body mass index is 29.65 kg/(m^2). GENERAL: The patient is a well-nourished female, in no acute distress. The vital signs are documented above. CARDIOVASCULAR: There is a regular rate and rhythm  PULMONARY: There is good air exchange bilaterally without wheezing or rales. I cannot palpate femoral pulses.  I have reviewed her CT angiogram which shows significant atherosclerotic disease involving the distal aorta and aortic bifurcation including both common iliac arteries. The left common iliac artery appears to have a healed dissection with severe stenosis.  MEDICAL ISSUES:  Peripheral vascular disease CT angio suggest severe disease of the aortic bifurcation and proximal common iliac arteries, especially on the left. The patient has disabling claudication which is significantly limiting her ability to perform her activities of daily living including grocery shopping. Fortunately she has put down her  cigarettes and plans on quitting smoking. We have discussed the option of getting I structured walking program and following her symptoms in trying to avoid any further intervention if at all possible. We've also discussed the option of proceeding with arteriography and proceeding with bilateral iliac angioplasty if she has a disease amenable to this. She has disease in the distal aorta so she would require a kissing balloon technique. She has decided to pursue intervention. I have reviewed with the patient the indications for arteriography. In addition, I have reviewed the potential complications of arteriography including but not limited to: Bleeding, arterial injury, arterial thrombosis, dye action, renal insufficiency, or other unpredictable medical problems. I have explained to the patient that if we find disease amenable to angioplasty we could potentially address this at the same time. I have discussed the potential complications of angioplasty and stenting, including but not limited to: Bleeding, arterial thrombosis, arterial injury, dissection, or the need for surgical intervention. Her procedure is scheduled for 08/22/2012. We will have her start Plavix one-week preoperatively and I have given her a prescription for this.    Amarrion Pastorino S [facility] [place] Beeper: [phone]

## 2012-07-27 NOTE — Assessment & Plan Note (Signed)
CT angio suggest severe disease of the aortic bifurcation and proximal common iliac arteries, especially on the left. The patient has disabling claudication which is significantly limiting her ability to perform her activities of daily living including grocery shopping. Fortunately she has put down her cigarettes and plans on quitting smoking. We have discussed the option of getting I structured walking program and following her symptoms in trying to avoid any further intervention if at all possible. We've also discussed the option of proceeding with arteriography and proceeding with bilateral iliac angioplasty if she has a disease amenable to this. She has disease in the distal aorta so she would require a kissing balloon technique. She has decided to pursue intervention. I have reviewed with the patient the indications for arteriography. In addition, I have reviewed the potential complications of arteriography including but not limited to: Bleeding, arterial injury, arterial thrombosis, dye action, renal insufficiency, or other unpredictable medical problems. I have explained to the patient that if we find disease amenable to angioplasty we could potentially address this at the same time. I have discussed the potential complications of angioplasty and stenting, including but not limited to: Bleeding, arterial thrombosis, arterial injury, dissection, or the need for surgical intervention. Her procedure is scheduled for 08/22/2012. We will have her start Plavix one-week preoperatively and I have given her a prescription for this.

## 2012-08-04 ENCOUNTER — Other Ambulatory Visit: Payer: Self-pay

## 2012-08-05 ENCOUNTER — Encounter (HOSPITAL_COMMUNITY): Payer: Self-pay | Admitting: Pharmacy Technician

## 2012-08-21 MED ORDER — SODIUM CHLORIDE 0.9 % IV SOLN: INTRAVENOUS | Status: DC

## 2012-08-22 ENCOUNTER — Encounter (HOSPITAL_COMMUNITY)
Admission: RE | Disposition: A | Discharge status: Home / Self Care | Payer: Self-pay | Source: Ambulatory Visit | Attending: Vascular Surgery

## 2012-08-22 ENCOUNTER — Ambulatory Visit (HOSPITAL_COMMUNITY)
Admission: RE | Admit: 2012-08-22 | Discharge: 2012-08-22 | Disposition: A | Discharge status: Home / Self Care | Payer: Medicare Other | Source: Ambulatory Visit | Attending: Vascular Surgery | Admitting: Vascular Surgery

## 2012-08-22 SURGERY — ABDOMINAL AORTAGRAM
Anesthesia: LOCAL

## 2012-08-25 ENCOUNTER — Other Ambulatory Visit: Payer: Self-pay

## 2012-08-25 DIAGNOSIS — I745 Embolism and thrombosis of iliac artery: Secondary | ICD-10-CM

## 2012-08-25 MED ORDER — CLOPIDOGREL BISULFATE 75 MG PO TABS: ORAL_TABLET | ORAL | Status: DC

## 2012-08-25 NOTE — Progress Notes (Signed)
Plavix reordered to be started on 09/05/12, one week prior to Aortogram and possible bilateral CIA PTA/Stents.  Pt. made aware of need to start on 09/05/12

## 2012-09-05 ENCOUNTER — Encounter (HOSPITAL_COMMUNITY): Payer: Self-pay | Admitting: Respiratory Therapy

## 2012-09-09 ENCOUNTER — Other Ambulatory Visit: Payer: Self-pay

## 2012-09-12 ENCOUNTER — Encounter (HOSPITAL_COMMUNITY)
Admission: RE | Disposition: A | Discharge status: Home / Self Care | Payer: Self-pay | Source: Ambulatory Visit | Attending: Vascular Surgery

## 2012-09-12 ENCOUNTER — Telehealth: Payer: Self-pay | Admitting: Vascular Surgery

## 2012-09-12 ENCOUNTER — Ambulatory Visit (HOSPITAL_COMMUNITY)
Admission: RE | Admit: 2012-09-12 | Discharge: 2012-09-12 | Disposition: A | Discharge status: Home / Self Care | Payer: Medicare Other | Source: Ambulatory Visit | Attending: Vascular Surgery | Admitting: Vascular Surgery

## 2012-09-12 DIAGNOSIS — F319 Bipolar disorder, unspecified: Secondary | ICD-10-CM | POA: Insufficient documentation

## 2012-09-12 DIAGNOSIS — G4700 Insomnia, unspecified: Secondary | ICD-10-CM | POA: Insufficient documentation

## 2012-09-12 DIAGNOSIS — E785 Hyperlipidemia, unspecified: Secondary | ICD-10-CM | POA: Insufficient documentation

## 2012-09-12 DIAGNOSIS — J45909 Unspecified asthma, uncomplicated: Secondary | ICD-10-CM | POA: Insufficient documentation

## 2012-09-12 DIAGNOSIS — I70219 Atherosclerosis of native arteries of extremities with intermittent claudication, unspecified extremity: Secondary | ICD-10-CM | POA: Insufficient documentation

## 2012-09-12 DIAGNOSIS — F411 Generalized anxiety disorder: Secondary | ICD-10-CM | POA: Insufficient documentation

## 2012-09-12 HISTORY — PX: ABDOMINAL AORTAGRAM: SHX5454

## 2012-09-12 LAB — POCT I-STAT, CHEM 8
BUN: 14 mg/dL (ref 6–23)
Creatinine, Ser: 0.9 mg/dL (ref 0.50–1.10)
Glucose, Bld: 84 mg/dL (ref 70–99)
Hemoglobin: 11.6 g/dL — ABNORMAL LOW (ref 12.0–15.0)
TCO2: 25 mmol/L (ref 0–100)

## 2012-09-12 SURGERY — ABDOMINAL AORTAGRAM
Anesthesia: LOCAL

## 2012-09-12 MED ORDER — OXYCODONE-ACETAMINOPHEN 5-325 MG PO TABS: 1.0000 | ORAL_TABLET | ORAL | Status: DC | PRN

## 2012-09-12 MED ORDER — LIDOCAINE HCL (PF) 1 % IJ SOLN
INTRAMUSCULAR | Status: AC
  Filled 2012-09-12: qty 30

## 2012-09-12 MED ORDER — FENTANYL CITRATE 0.05 MG/ML IJ SOLN
INTRAMUSCULAR | Status: AC
  Filled 2012-09-12: qty 2

## 2012-09-12 MED ORDER — MIDAZOLAM HCL 2 MG/2ML IJ SOLN
INTRAMUSCULAR | Status: AC
  Filled 2012-09-12: qty 2

## 2012-09-12 MED ORDER — MORPHINE SULFATE 4 MG/ML IJ SOLN: 2.0000 mg | INTRAMUSCULAR | Status: DC | PRN

## 2012-09-12 MED ORDER — SODIUM CHLORIDE 0.9 % IV SOLN: INTRAVENOUS | Status: DC

## 2012-09-12 NOTE — H&P (Signed)
[facility] [place]  Brief History and Physical  History of Present Illness  Felicia [name] is a 46 y.o. female who presents with chief complaint: B claudication.  The patient presents today for: aortogram , bilateral leg runoff, possible iliac intervention.  Past Medical History  Diagnosis Date  . Asthma   . PUD (peptic ulcer disease)   . Anxiety   . Bipolar 1 disorder   . Insomnia   . Hyperlipidemia     Past Surgical History  Procedure Date  . Shoulder surgery   . Nose surgery   . Cesarean section   . Nose surgery     History   Social History  . Marital Status: Married    Spouse Name: N/A    Number of Children: N/A  . Years of Education: N/A   Occupational History  . Not on file.   Social History Main Topics  . Smoking status: Former Smoker -- 0.5 packs/day for 30 years    Types: Cigarettes    Quit date: 07/13/2012  . Smokeless tobacco: Never Used  . Alcohol Use: No  . Drug Use: No  . Sexually Active: Not on file   Other Topics Concern  . Not on file   Social History Narrative  . No narrative on file    Family History  Problem Relation Age of Onset  . Heart disease Mother   . Diabetes Mother   . Hyperlipidemia Mother   . Heart disease Father   . Diabetes Father   . Diabetes Brother     No current facility-administered medications on file prior to encounter.   Current Outpatient Prescriptions on File Prior to Encounter  Medication Sig Dispense Refill  . albuterol (PROVENTIL HFA;VENTOLIN HFA) 108 (90 BASE) MCG/ACT inhaler Inhale 2 puffs into the lungs every 6 (six) hours as needed. For shortness of breath      . atorvastatin (LIPITOR) 40 MG tablet Take 40 mg by mouth daily.      . clonazePAM (KLONOPIN) 1 MG tablet Take 1 mg by mouth 3 (three) times daily as needed. anxiety      . clopidogrel (PLAVIX) 75 MG tablet Start taking 75 mg, 1 tablet, by mouth, 1 week prior to procedure; start on 09/05/12  10 tablet  0  .  cyclobenzaprine (FLEXERIL) 10 MG tablet Take 10 mg by mouth 3 (three) times daily as needed. Muscle spasm      . divalproex (DEPAKOTE) 500 MG 24 hr tablet Take 1,000 mg by mouth at bedtime.         Allergies  Allergen Reactions  . Cephalosporins Hives  . Codeine Nausea Only  . Doxycycline Hives  . Tetracyclines & Related Hives  . Reflex Pain Relief (Menthol) Rash    Review of Systems: As listed above, otherwise negative.  Physical Examination  Filed Vitals:   09/12/12 1113 09/12/12 1115  BP: 122/69   Pulse: 92   Temp: 97.1 F (36.2 C)   TempSrc: Oral   Height:  4\' 9"  (1.448 m)  Weight:  135 lb (61.236 kg)  SpO2: 97%     General: A&O x 3, WDWN  Pulmonary: Sym exp, good air movt, CTAB, no rales, rhonchi, & wheezing  Cardiac: RRR, Nl S1, S2, no Murmurs, rubs or gallops  Gastrointestinal: soft, NTND, -G/R, - HSM, - masses, - CVAT B  Musculoskeletal: M/S 5/5 throughout , Extremities without ischemic changes   Laboratory See [facility]  Medical Decision Making  Felicia [name]  is a 46 y.o. female who presents with: bilateral claudication.   The patient is scheduled for: aortogram , bilateral leg runoff, possible iliac intervention. I discussed with the patient the nature of angiographic procedures, especially the limited patencies of any endovascular intervention.  The patient is aware of that the risks of an angiographic procedure include but are not limited to: bleeding, infection, access site complications, renal failure, embolization, rupture of vessel, dissection, possible need for emergent surgical intervention, possible need for surgical procedures to treat the patient's pathology, and stroke and death.    The patient is aware of the risks and agrees to proceed.  Leonides Sake, MD [facility] [place] Office: [phone] Pager: [phone]  09/12/2012, 11:29 AM

## 2012-09-12 NOTE — Op Note (Signed)
OPERATIVE NOTE   PROCEDURE: 1.  Right common femoral artery cannulation under ultrasound guidance 2.  Aortogram 3.  Bilateral leg runoff  PRE-OPERATIVE DIAGNOSIS: Intermittent claudication  POST-OPERATIVE DIAGNOSIS: same as above   SURGEON: Leonides Sake, MD  ANESTHESIA: conscious sedation  ESTIMATED BLOOD LOSS: 30 cc  CONTRAST: 135 cc  FINDING(S):  Aorta: distal stenosis >90%, calcified (too small for kissing stents)  Superior mesenteric artery: not well visualized Celiac artery: patent  Right Left  RA Patent with nephrogram Patent with nephrogram  CIA Patent but underfilled Patent but 50% stenosis, underfilled  EIA Patent, unfilled Patent, unfilled  IIA Patent, unfilled Patent, unfilled  CFA Patent, unfilled Patent, unfilled  SFA Patent, unfilled Patent, unfilled  PFA Patent, unfilled Patent, unfilled  Pop Patent, unfilled Patent, unfilled  Trif Patent, unfilled Patent, unfilled  AT Patent, unfilled Not visualized due flow > table movement  Pero Patent, unfilled Not visualized due flow > table movement  PT Patent, unfilled Not visualized due flow > table movement   SPECIMEN(S):  none  INDICATIONS:   Felicia [name] is a 46 y.o. female who presents with intermittent claudication.  The patient presents for: aortogram, bilateral runoff and possible intervention.  I discussed with the patient the nature of angiographic procedures, especially the limited patencies of any endovascular intervention.  The patient is aware of that the risks of an angiographic procedure include but are not limited to: bleeding, infection, access site complications, renal failure, embolization, rupture of vessel, dissection, possible need for emergent surgical intervention, possible need for surgical procedures to treat the patient's pathology, and stroke and death.  The patient is aware of the risks and agrees to proceed.  DESCRIPTION: After full informed consent was obtained from the patient, the  patient was brought back to the angiography suite.  The patient was placed supine upon the angiography table and connected to monitoring equipment.  The patient was then given conscious sedation, the amounts of which are documented in the patient's chart.  The patient was prepped and drape in the standard fashion for an angiographic procedure.  At this point, attention was turned to the right groin.  Under ultrasound guidance, the right common femoral artery was cannulated with a micropuncture needle.  The microwire was advanced into the iliac arterial system.  The needle was exchanged for a microsheath, which was loaded into the common femoral artery over the wire.  The microwire was exchanged for a [facility] wire which was advanced into the common iliac artery.  The microsheath was then exchanged for a 5-Fr sheath which was loaded into the common femoral artery.  A KMP catheter was loaded over the wire.  The wire was removed and I did a hand injection via the catheter to image the proximal common iliac artery.  The glidewire was placed through the catheter and used to navigate into the aorta.  I advanced the catheter into the suprarenal segment and then exchanged the wire for the [facility] wire.  The catheter was was exchanged for the pigtail catheter.  The catheter was then loaded over the wire up to the level of L1.  The catheter was connected to the power injector circuit.  After de-airring and de-clotting the circuit, a power injector aortogram was completed.  Based on the images, this aorta is not large enough to accommodate two common iliac artery and she will need an aortobifemoral bypass graft.  I pulled the catheter to proximal to the aortic stenosis.  An automated bilateral leg runoff  was completed.  I replaced the wire into the catheter, straightening out the crook in the catheter.  Both were removed from the sheath together. The sheath was aspirated.  No clots were present and the sheath was reloaded with  heparinized saline.    COMPLICATIONS: none  CONDITION: stable   Leonides Sake, MD [facility] [place] Office: [phone] Pager: [phone]  09/12/2012, 1:28 PM

## 2012-09-12 NOTE — Telephone Encounter (Addendum)
Message copied by Shari Prows on Mon Sep 12, 2012  3:23 PM ------      Message from: Melene Plan      Created: Mon Sep 12, 2012  3:08 PM       Good Th[facility] Lolling!!      ----- Message -----         From: Fransisco Hertz, MD         Sent: 09/12/2012   1:36 PM           To: Reuel Derby, Melene Plan, RN            JANSYN MESECHER      [id number]      24-Apr-1966            PROCEDURE:      1.  Right common femoral artery cannulation under ultrasound guidance      2.  Aortogram      3.  Bilateral leg runoff            Follow-up: Dr. Edilia Bo this week            I scheduled an appt on 09/14/12 at 10:30am per above note. Patient is aware of the appt/awt

## 2012-09-13 ENCOUNTER — Encounter: Payer: Self-pay | Admitting: Vascular Surgery

## 2012-09-14 ENCOUNTER — Ambulatory Visit (INDEPENDENT_AMBULATORY_CARE_PROVIDER_SITE_OTHER): Payer: Medicare Other | Admitting: Vascular Surgery

## 2012-09-14 ENCOUNTER — Encounter: Payer: Self-pay | Admitting: Vascular Surgery

## 2012-09-14 VITALS — BP 121/64 | HR 86 | Ht <= 58 in | Wt 151.0 lb

## 2012-09-14 DIAGNOSIS — I70219 Atherosclerosis of native arteries of extremities with intermittent claudication, unspecified extremity: Secondary | ICD-10-CM

## 2012-09-14 NOTE — Assessment & Plan Note (Signed)
Aortogram shows a tight stenosis of the distal aorta. This patient has claudication of both lower charities but no rest pain or nonhealing ulcers. I had a long discussion with the patient and her husband about their options. First option would be to get on a structured walking program and also to work on careful control of her blood pressure and cholesterol. We have had a discussion about the importance of nutrition. She smoking 2 months ago and seems to be doing well with this. Given that she does not have rest pain or nonhealing ulcer I think this option is a reasonable. I have explained that the only real risk with this option would be if the aorta occluded suddenly she could potentially have significant sudden worsening of her symptoms. We have also discussed the surgical options as she was not a candidate for an endovascular approach based on her arteriogram. I discussed aorto right femoral bypass grafting and the potential risks associated with this. I think she would be at slightly increased risk for graft thrombosis given the small size of her arteries. However I do think she is a reasonable candidate for aortobifemoral bypass grafting. If she were to elect to proceed with surgery she would need preoperative cardiac evaluation. Given that she is non-and healthy I do not think she would be a good candidate for axillobifemoral bypass grafting. Currently, the patient wishes to attempt to insert if treatment with a structured walking program and attention to her nutrition and diet. I think this is perfectly reasonable. I'll see her back in 3 months with follow up ABIs. She knows to call sooner if she has problems. Certainly if her symptoms progressed I think she is a good candidate for aortobifemoral bypass grafting.

## 2012-09-14 NOTE — Progress Notes (Signed)
[facility] [place]  Patient name: Felicia [name] MRN: [id number] DOB: September 17, 1966 Sex: female  REASON FOR VISIT: follow up after aortogram  HPI: Felicia [name] is a 46 y.o. female who I been following with claudication. Ultimately she wished to proceed with arteriography and this was performed yesterday by Dr. Osvaldo Angst showed a tight distal aortic stenosis which was not amenable to angioplasty. She comes in today to discuss her results.  He continues to have claudication in both lower extremities involves her hips thighs and calves bilaterally. She can walk approximately 40 feet before experiencing symptoms. She denies rest pain. She denies nonhealing ulcers.   REVIEW OF SYSTEMS: [name] ] denotes positive finding; [  ] denotes negative finding  CARDIOVASCULAR:  [ ]  chest pain   [ ]  dyspnea on exertion    CONSTITUTIONAL:  [ ]  fever   [ ]  chills  PHYSICAL EXAM: Filed Vitals:   09/14/12 1021  BP: 121/64  Pulse: 86  Height: 4\' 9"  (1.448 m)  Weight: 151 lb (68.493 kg)  SpO2: 99%   Body mass index is 32.68 kg/(m^2). GENERAL: The patient is a well-nourished female, in no acute distress. The vital signs are documented above. CARDIOVASCULAR: There is a regular rate and rhythm  PULMONARY: There is good air exchange bilaterally without wheezing or rales. I cannot palpate femoral or pedal pulses. Both feet appear adequately perfused. Her right groin cannulation site looks fine without evidence of hematoma.  MEDICAL ISSUES:  Atherosclerosis of native arteries of the extremities with intermittent claudication Aortogram shows a tight stenosis of the distal aorta. This patient has claudication of both lower charities but no rest pain or nonhealing ulcers. I had a long discussion with the patient and her husband about their options. First option would be to get on a structured walking program and also to work on careful control of her blood pressure and cholesterol. We have had  a discussion about the importance of nutrition. She smoking 2 months ago and seems to be doing well with this. Given that she does not have rest pain or nonhealing ulcer I think this option is a reasonable. I have explained that the only real risk with this option would be if the aorta occluded suddenly she could potentially have significant sudden worsening of her symptoms. We have also discussed the surgical options as she was not a candidate for an endovascular approach based on her arteriogram. I discussed aorto right femoral bypass grafting and the potential risks associated with this. I think she would be at slightly increased risk for graft thrombosis given the small size of her arteries. However I do think she is a reasonable candidate for aortobifemoral bypass grafting. If she were to elect to proceed with surgery she would need preoperative cardiac evaluation. Given that she is non-and healthy I do not think she would be a good candidate for axillobifemoral bypass grafting. Currently, the patient wishes to attempt to insert if treatment with a structured walking program and attention to her nutrition and diet. I think this is perfectly reasonable. I'll see her back in 3 months with follow up ABIs. She knows to call sooner if she has problems. Certainly if her symptoms progressed I think she is a good candidate for aortobifemoral bypass grafting.   DICKSON,CHRISTOPHER S [facility] of  [place]: [phone]

## 2012-09-15 ENCOUNTER — Other Ambulatory Visit: Payer: Self-pay | Admitting: *Deleted

## 2012-09-15 DIAGNOSIS — I739 Peripheral vascular disease, unspecified: Secondary | ICD-10-CM

## 2012-12-13 ENCOUNTER — Encounter: Payer: Self-pay | Admitting: Vascular Surgery

## 2012-12-14 ENCOUNTER — Encounter (INDEPENDENT_AMBULATORY_CARE_PROVIDER_SITE_OTHER): Payer: Medicare Other | Admitting: *Deleted

## 2012-12-14 ENCOUNTER — Ambulatory Visit (INDEPENDENT_AMBULATORY_CARE_PROVIDER_SITE_OTHER): Payer: Medicare Other | Admitting: Vascular Surgery

## 2012-12-14 ENCOUNTER — Encounter: Payer: Self-pay | Admitting: Vascular Surgery

## 2012-12-14 ENCOUNTER — Other Ambulatory Visit: Payer: Self-pay | Admitting: *Deleted

## 2012-12-14 VITALS — BP 115/48 | HR 105 | Ht <= 58 in | Wt 145.2 lb

## 2012-12-14 DIAGNOSIS — I70219 Atherosclerosis of native arteries of extremities with intermittent claudication, unspecified extremity: Secondary | ICD-10-CM

## 2012-12-14 DIAGNOSIS — I739 Peripheral vascular disease, unspecified: Secondary | ICD-10-CM

## 2012-12-14 NOTE — Progress Notes (Signed)
[facility] of [place]  Patient name: Felicia [name] MRN: [id number] DOB: February 24, 1966 Sex: female  REASON FOR VISIT: follow up of aortoiliac disease  HPI: Felicia [name] is a 47 y.o. female who I last saw on 09/14/2012. I have been following her with claudication. She underwent an arteriogram via Dr. Imogene Burn on 09/13/12 which showed a tight distal aortic stenosis which was not amenable to angioplasty. She has very small arteries. Given that her arteries are quite small and she would not be a great surgical candidate so we have been trying to treat this nonoperatively. When I saw her last she was trying to quit smoking and also we discussed the importance of a structured walking program. She did quit smoking temporarily but because of some stress started smoking again although she quit again in November. She does try to walk every day although the cold weather has worsened her asthma which makes it more difficult to walk outside. She had been walking up to 2 miles a day. She experiences pain in both lower extremities which is brought on by ambulation and relieved with rest. Her symptoms involve her hips thighs and calves bilaterally. She denies any rest pain. She denies any nonhealing ulcers on her feet.  Past Medical History  Diagnosis Date  . Asthma   . PUD (peptic ulcer disease)   . Anxiety   . Bipolar 1 disorder   . Insomnia   . Hyperlipidemia     Family History  Problem Relation Age of Onset  . Heart disease Mother   . Diabetes Mother   . Hyperlipidemia Mother   . Heart disease Father   . Diabetes Father   . Diabetes Brother     SOCIAL HISTORY: History  Substance Use Topics  . Smoking status: Former Smoker -- 0.5 packs/day for 30 years    Types: Cigarettes    Quit date: 07/13/2012  . Smokeless tobacco: Never Used     Comment: pt states that she has smoked some but has not smoked since the middle of Nov  . Alcohol Use: No    Allergies  Allergen Reactions    . Cephalosporins Hives  . Codeine Nausea Only  . Doxycycline Hives  . Tetracyclines & Related Hives  . Reflex Pain Relief (Menthol) Rash    Current Outpatient Prescriptions  Medication Sig Dispense Refill  . albuterol (PROVENTIL HFA;VENTOLIN HFA) 108 (90 BASE) MCG/ACT inhaler Inhale 2 puffs into the lungs every 6 (six) hours as needed. For shortness of breath      . albuterol (PROVENTIL) (2.5 MG/3ML) 0.083% nebulizer solution Take 2.5 mg by nebulization every 6 (six) hours as needed. For shortness of breath      . aspirin 81 MG tablet Take 81 mg by mouth daily.      Marland Kitchen atorvastatin (LIPITOR) 40 MG tablet Take 40 mg by mouth daily.      . clonazePAM (KLONOPIN) 1 MG tablet Take 1 mg by mouth 3 (three) times daily as needed. anxiety      . cyclobenzaprine (FLEXERIL) 10 MG tablet Take 10 mg by mouth 3 (three) times daily as needed. Muscle spasm      . divalproex (DEPAKOTE) 500 MG 24 hr tablet Take 1,000 mg by mouth at bedtime.       Marland Kitchen ibuprofen (ADVIL,MOTRIN) 200 MG tablet Take 200 mg by mouth every 6 (six) hours as needed.      . Multiple Vitamin (MULTIVITAMIN) tablet Take 1 tablet by mouth daily.      Marland Kitchen  clopidogrel (PLAVIX) 75 MG tablet Start taking 75 mg, 1 tablet, by mouth, 1 week prior to procedure; start on 09/05/12  10 tablet  0    REVIEW OF SYSTEMS: [name] ] denotes positive finding; [  ] denotes negative finding  CARDIOVASCULAR:  [ ]  chest pain   [ ]  chest pressure   [ ]  palpitations   [ ]  orthopnea   [ ]  dyspnea on exertion   [ ]  claudication   [ ]  rest pain   [ ]  DVT   [ ]  phlebitis PULMONARY:   [ ]  productive cough   [name] ] asthma   [name] ] wheezing NEUROLOGIC:   [ ]  weakness  [ ]  paresthesias  [ ]  aphasia  [ ]  amaurosis  [ ]  dizziness HEMATOLOGIC:   [ ]  bleeding problems   [ ]  clotting disorders MUSCULOSKELETAL:  [ ]  joint pain   [ ]  joint swelling [ ]  leg swelling GASTROINTESTINAL: [ ]   blood in stool  [ ]   hematemesis GENITOURINARY:  [ ]   dysuria  [ ]   hematuria PSYCHIATRIC:  [ ]   history of major depression INTEGUMENTARY:  [ ]  rashes  [ ]  ulcers CONSTITUTIONAL:  [ ]  fever   [ ]  chills  PHYSICAL EXAM: Filed Vitals:   12/14/12 1104  BP: 115/48  Pulse: 105  Height: 4\' 9"  (1.448 m)  Weight: 145 lb 3.2 oz (65.862 kg)  SpO2: 99%   Body mass index is 31.42 kg/(m^2). GENERAL: The patient is a well-nourished female, in no acute distress. The vital signs are documented above. CARDIOVASCULAR: There is a regular rate and rhythm. I do not detect carotid bruits. I cannot palpate femoral, popliteal, or pedal pulses bilaterally. She has no significant lower extremity swelling. PULMONARY: There is good air exchange bilaterally without wheezing or rales. ABDOMEN: Soft and non-tender with normal pitched bowel sounds.  MUSCULOSKELETAL: There are no major deformities or cyanosis. NEUROLOGIC: No focal weakness or paresthesias are detected. SKIN: There are no ulcers or rashes noted. PSYCHIATRIC: The patient has a normal affect.  DATA:  I have independently interpreted her arterial Doppler study today which shows an ABI of 53% on the right and 43% on the left. She has monophasic Doppler signals in the dorsalis pedis and posterior tibial positions bilaterally.  MEDICAL ISSUES:  Atherosclerosis of native arteries of the extremities with intermittent claudication This patient has stable claudication symptoms of both lower extremities. She has quit smoking in November. I discussed the importance of maintaining off of tobacco. She also has been doing well with her walking program except for the cold weather has aggravated her asthma. This is the limiting factor right now. I began explained because of the small size of her arteries she is not an ideal candidate for aortobifemoral bypass grafting. Likewise she was not a candidate for an endovascular approach. Hopefully we'll be a little to avoid surgical intervention. She seems very motivated. I've ordered follow up ABIs in 6 months and I'll  see her back at that time. She knows to call sooner if she has problems.   Maitlyn Penza S [facility] [place]: [phone]

## 2012-12-14 NOTE — Assessment & Plan Note (Signed)
This patient has stable claudication symptoms of both lower extremities. She has quit smoking in November. I discussed the importance of maintaining off of tobacco. She also has been doing well with her walking program except for the cold weather has aggravated her asthma. This is the limiting factor right now. I began explained because of the small size of her arteries she is not an ideal candidate for aortobifemoral bypass grafting. Likewise she was not a candidate for an endovascular approach. Hopefully we'll be a little to avoid surgical intervention. She seems very motivated. I've ordered follow up ABIs in 6 months and I'll see her back at that time. She knows to call sooner if she has problems.

## 2013-01-13 ENCOUNTER — Other Ambulatory Visit (HOSPITAL_COMMUNITY): Payer: Self-pay | Admitting: Family Medicine

## 2013-01-13 DIAGNOSIS — Z1231 Encounter for screening mammogram for malignant neoplasm of breast: Secondary | ICD-10-CM

## 2013-01-19 ENCOUNTER — Ambulatory Visit (HOSPITAL_COMMUNITY)
Admission: RE | Admit: 2013-01-19 | Discharge: 2013-01-19 | Disposition: A | Discharge status: Home / Self Care | Payer: Medicare PPO | Source: Ambulatory Visit | Attending: Family Medicine | Admitting: Family Medicine

## 2013-01-19 DIAGNOSIS — Z1231 Encounter for screening mammogram for malignant neoplasm of breast: Secondary | ICD-10-CM | POA: Insufficient documentation

## 2013-03-06 ENCOUNTER — Other Ambulatory Visit: Payer: Self-pay | Admitting: Family Medicine

## 2013-03-06 NOTE — Telephone Encounter (Signed)
Rx Refilled  

## 2013-03-06 NOTE — Telephone Encounter (Signed)
Ok to call out both for 1 month.

## 2013-03-06 NOTE — Telephone Encounter (Signed)
?  ok to refill °

## 2013-03-14 MED ORDER — CLONAZEPAM 1 MG PO TABS: ORAL_TABLET | ORAL | Status: DC

## 2013-03-14 MED ORDER — CYCLOBENZAPRINE HCL 10 MG PO TABS: ORAL_TABLET | ORAL | Status: DC

## 2013-03-14 NOTE — Telephone Encounter (Signed)
Script called out per protocol

## 2013-03-20 ENCOUNTER — Encounter: Payer: Self-pay | Admitting: Family Medicine

## 2013-03-20 ENCOUNTER — Ambulatory Visit (INDEPENDENT_AMBULATORY_CARE_PROVIDER_SITE_OTHER): Payer: Medicare PPO | Admitting: Family Medicine

## 2013-03-20 VITALS — BP 128/74 | HR 98 | Temp 97.6°F | Resp 18 | Wt 149.0 lb

## 2013-03-20 DIAGNOSIS — E785 Hyperlipidemia, unspecified: Secondary | ICD-10-CM

## 2013-03-20 DIAGNOSIS — I739 Peripheral vascular disease, unspecified: Secondary | ICD-10-CM

## 2013-03-20 DIAGNOSIS — R300 Dysuria: Secondary | ICD-10-CM

## 2013-03-20 DIAGNOSIS — F319 Bipolar disorder, unspecified: Secondary | ICD-10-CM

## 2013-03-20 LAB — LIPID PANEL
Cholesterol: 162 mg/dL (ref 0–200)
Triglycerides: 259 mg/dL — ABNORMAL HIGH (ref <150)
VLDL: 52 mg/dL — ABNORMAL HIGH (ref 0–40)

## 2013-03-20 LAB — BASIC METABOLIC PANEL
BUN: 9 mg/dL (ref 6–23)
CO2: 29 mEq/L (ref 19–32)
Calcium: 9 mg/dL (ref 8.4–10.5)
Creat: 0.77 mg/dL (ref 0.50–1.10)
Glucose, Bld: 97 mg/dL (ref 70–99)
Sodium: 141 mEq/L (ref 135–145)

## 2013-03-20 LAB — URINALYSIS, ROUTINE W REFLEX MICROSCOPIC
Bilirubin Urine: NEGATIVE
Glucose, UA: NEGATIVE mg/dL
Ketones, ur: NEGATIVE mg/dL
Protein, ur: NEGATIVE mg/dL
Urobilinogen, UA: 0.2 mg/dL (ref 0.0–1.0)

## 2013-03-20 LAB — CBC WITH DIFFERENTIAL/PLATELET
Basophils Absolute: 0 10*3/uL (ref 0.0–0.1)
Basophils Relative: 1 % (ref 0–1)
HCT: 38 % (ref 36.0–46.0)
MCHC: 34.2 g/dL (ref 30.0–36.0)
Monocytes Absolute: 0.5 10*3/uL (ref 0.1–1.0)
Neutro Abs: 2.6 10*3/uL (ref 1.7–7.7)
Platelets: 232 10*3/uL (ref 150–400)
RDW: 13.3 % (ref 11.5–15.5)

## 2013-03-20 LAB — URINALYSIS, MICROSCOPIC ONLY: Casts: NONE SEEN

## 2013-03-20 LAB — HEPATIC FUNCTION PANEL
ALT: 10 U/L (ref 0–35)
Bilirubin, Direct: 0.1 mg/dL (ref 0.0–0.3)
Total Protein: 6.4 g/dL (ref 6.0–8.3)

## 2013-03-20 MED ORDER — CIPROFLOXACIN HCL 500 MG PO TABS: 500.0000 mg | ORAL_TABLET | Freq: Two times a day (BID) | ORAL | Status: DC

## 2013-03-20 NOTE — Progress Notes (Signed)
Subjective:    Patient ID: Felicia [name], female    DOB: 05-15-1966, 47 y.o.   MRN: [id number]  HPI Patient reports approximately one month of urinary hesitancy, dysuria, frequency, and retention.  This began after a very heavy period at the end of February.  She denies fevers chills nausea or vomiting.  She is also here for followup of her peripheral vascular disease. She has a right carotid bruit. She has known ASCVD in her neck as well as in her legs.  She is currently on Lipitor 40 mg by mouth daily and aspirin 81 mg by mouth daily. However the patient has quit smoking. She did gain 8 pounds but she has successfully managed to refrain from smoking for probably 3 months.  Bipolar disorder and migraines-I have been managing the patient with Depakote extended release 500 mg tablets, 2 tablets every night. She no longer has any headaches. Her anxiety and depression and manic symptoms have been well controlled. She still needs an occasional Klonopin however she denies frequent panic attacks. She denies any suicidal ideation.  Past Medical History  Diagnosis Date  . Asthma   . PUD (peptic ulcer disease)   . Anxiety   . Bipolar 1 disorder   . Insomnia   . Hyperlipidemia    Current Outpatient Prescriptions on File Prior to Visit  Medication Sig Dispense Refill  . albuterol (PROVENTIL HFA;VENTOLIN HFA) 108 (90 BASE) MCG/ACT inhaler Inhale 2 puffs into the lungs every 6 (six) hours as needed. For shortness of breath      . albuterol (PROVENTIL) (2.5 MG/3ML) 0.083% nebulizer solution Take 2.5 mg by nebulization every 6 (six) hours as needed. For shortness of breath      . aspirin 81 MG tablet Take 81 mg by mouth daily.      Marland Kitchen atorvastatin (LIPITOR) 40 MG tablet Take 40 mg by mouth daily.      . clonazePAM (KLONOPIN) 1 MG tablet TAKE 1 TABLET BY MOUTH 3 TIMES A DAY  90 tablet  0  . cyclobenzaprine (FLEXERIL) 10 MG tablet TAKE 1 TABLET BY MOUTH EVERY 6 HOURS AS NEEDED  90 tablet  0  . divalproex  (DEPAKOTE) 500 MG 24 hr tablet Take 1,000 mg by mouth at bedtime.       Marland Kitchen ibuprofen (ADVIL,MOTRIN) 200 MG tablet Take 200 mg by mouth every 6 (six) hours as needed.      . Multiple Vitamin (MULTIVITAMIN) tablet Take 1 tablet by mouth daily.       No current facility-administered medications on file prior to visit.   Allergies  Allergen Reactions  . Cephalosporins Hives  . Codeine Nausea Only  . Doxycycline Hives  . Tetracyclines & Related Hives  . Reflex Pain Relief (Menthol) Rash   History   Social History  . Marital Status: Married    Spouse Name: N/A    Number of Children: N/A  . Years of Education: N/A   Occupational History  . Not on file.   Social History Main Topics  . Smoking status: Former Smoker -- 0.50 packs/day for 30 years    Types: Cigarettes    Quit date: 07/13/2012  . Smokeless tobacco: Never Used     Comment: pt states that she has smoked some but has not smoked since the middle of Nov  . Alcohol Use: No  . Drug Use: No  . Sexually Active: Not on file   Other Topics Concern  . Not on file   Social History  Narrative  . No narrative on file   Family History  Problem Relation Age of Onset  . Heart disease Mother   . Diabetes Mother   . Hyperlipidemia Mother   . Heart disease Father   . Diabetes Father   . Diabetes Brother       Review of Systems    review of systems is otherwise negative Objective:   Physical Exam  Constitutional: She appears well-developed and well-nourished.  HENT:  Head: Normocephalic.  Right Ear: External ear normal.  Left Ear: External ear normal.  Nose: Nose normal.  Mouth/Throat: Oropharynx is clear and moist.  Eyes: Conjunctivae and EOM are normal. Pupils are equal, round, and reactive to light.  Neck: Normal range of motion. Neck supple. No JVD present. No thyromegaly present.  Cardiovascular: Normal rate, regular rhythm and normal heart sounds.   No murmur heard. Pulmonary/Chest: Effort normal and breath  sounds normal. No respiratory distress. She has no wheezes. She has no rales. She exhibits no tenderness.  Abdominal: Soft. Bowel sounds are normal. She exhibits no distension. There is no tenderness. There is no rebound and no guarding.  Lymphadenopathy:    She has no cervical adenopathy.   she has a right carotid bruit but carotid ultrasounds were negative for significant stenosis when obtained in July of 2012.        Assessment & Plan:  1. Dysuria Treat with Cipro 500 mg by mouth twice a day for 10 days. Her symptoms are consistent with an infection. I will send a urine culture to ensure that this truly is an infection his urinalysis only shows hematuria. - Urinalysis, Routine w reflex microscopic - Urine culture  2. PVD (peripheral vascular disease) Graduated the patient on smoking cessation. Check fasting lipid panel to ensure her LDL is less than 70. - Basic Metabolic Panel - CBC with Differential - Hepatic Function Panel - Lipid Panel  3. HLD (hyperlipidemia) Goal LDL is less than 70, consider Zetia if greater than 70. - Basic Metabolic Panel - CBC with Differential - Hepatic Function Panel - Lipid Panel  4. Bipolar disorder, unspecified Currently well controlled on Depakote extended release 1000 mg by mouth each bedtime. This is also successfully controlling her migraine headaches. When she stopped in the past her migraines have returned. Therefore we'll continue the medication at the present dose. Advised increasing aerobic exercise to address her recent 8 pound weight gain.

## 2013-03-22 LAB — URINE CULTURE
Colony Count: NO GROWTH
Organism ID, Bacteria: NO GROWTH

## 2013-05-04 ENCOUNTER — Other Ambulatory Visit: Payer: Self-pay | Admitting: Family Medicine

## 2013-05-05 NOTE — Telephone Encounter (Signed)
Ok to refill 

## 2013-05-05 NOTE — Telephone Encounter (Signed)
Med refilled.

## 2013-06-01 ENCOUNTER — Other Ambulatory Visit: Payer: Self-pay | Admitting: Family Medicine

## 2013-06-01 NOTE — Telephone Encounter (Signed)
Ok to refill 

## 2013-06-01 NOTE — Telephone Encounter (Signed)
?   OK to Refill  

## 2013-06-06 ENCOUNTER — Telehealth: Payer: Self-pay | Admitting: Family Medicine

## 2013-06-06 MED ORDER — ATORVASTATIN CALCIUM 40 MG PO TABS: 40.0000 mg | ORAL_TABLET | Freq: Every day | ORAL | Status: DC

## 2013-06-06 MED ORDER — DIVALPROEX SODIUM ER 500 MG PO TB24: 1000.0000 mg | ORAL_TABLET | Freq: Every day | ORAL | Status: DC

## 2013-06-06 NOTE — Telephone Encounter (Signed)
Rx Refilled  

## 2013-06-06 NOTE — Telephone Encounter (Signed)
Med called into [facility]

## 2013-06-13 ENCOUNTER — Encounter: Payer: Self-pay | Admitting: Vascular Surgery

## 2013-06-14 ENCOUNTER — Encounter: Payer: Self-pay | Admitting: Vascular Surgery

## 2013-06-14 ENCOUNTER — Encounter (INDEPENDENT_AMBULATORY_CARE_PROVIDER_SITE_OTHER): Payer: Medicare PPO | Admitting: *Deleted

## 2013-06-14 ENCOUNTER — Ambulatory Visit (INDEPENDENT_AMBULATORY_CARE_PROVIDER_SITE_OTHER): Payer: Medicare PPO | Admitting: Vascular Surgery

## 2013-06-14 VITALS — BP 121/68 | HR 99 | Ht <= 58 in | Wt 136.0 lb

## 2013-06-14 DIAGNOSIS — I739 Peripheral vascular disease, unspecified: Secondary | ICD-10-CM | POA: Insufficient documentation

## 2013-06-14 DIAGNOSIS — I70219 Atherosclerosis of native arteries of extremities with intermittent claudication, unspecified extremity: Secondary | ICD-10-CM

## 2013-06-14 NOTE — Progress Notes (Signed)
[facility]  Patient name: Felicia [name] MRN: [id number] DOB: [birth year] Sex: female  REASON FOR VISIT: follow up of aortoiliac occlusive disease  HPI: Felicia [name] is a 47 y.o. female who I have been following with aortoiliac occlusive disease. She has undergone a previous arteriogram which shows a severe distal aortic stenosis. She has a small arteries and is felt to be at increased risk for endovascular intervention or open aortic surgery. Therefore, we have been trying to treat this conservatively and nonoperatively. She had quit smoking but because of some stress at home is now smoking a few cigarettes a day. She does try to stay as active as possible. She experiences bilateral lower extremity claudication which is equal in both legs. Her symptoms are brought on by ambulation and relieved with rest. The pain involves her back, hips, thighs, and calves bilaterally. He denies any history of rest pain or history of nonhealing ulcers. She feels that her symptoms have been relatively stable.  Past Medical History  Diagnosis Date  . Asthma   . PUD (peptic ulcer disease)   . Anxiety   . Bipolar 1 disorder   . Insomnia   . Hyperlipidemia   . Anemia   . Atrial fibrillation    Family History  Problem Relation Age of Onset  . Heart disease Mother   . Diabetes Mother   . Hyperlipidemia Mother   . Hypertension Mother   . Heart attack Mother   . Heart disease Father   . Diabetes Father   . Heart attack Father   . Diabetes Brother    SOCIAL HISTORY: History  Substance Use Topics  . Smoking status: Light Tobacco Smoker -- 0.50 packs/day for 30 years    Types: Cigarettes    Last Attempt to Quit: 07/13/2012  . Smokeless tobacco: Never Used     Comment: pt states that she is dealing with some stressful events in her life and as a result has began smoking about 3-4 cigs per day  . Alcohol Use: No   Allergies  Allergen Reactions  . Cephalosporins Hives   . Codeine Nausea Only  . Doxycycline Hives  . Tetracyclines & Related Hives  . Reflex Pain Relief (Menthol) Rash   Current Outpatient Prescriptions  Medication Sig Dispense Refill  . albuterol (PROVENTIL HFA;VENTOLIN HFA) 108 (90 BASE) MCG/ACT inhaler Inhale 2 puffs into the lungs every 6 (six) hours as needed. For shortness of breath      . albuterol (PROVENTIL) (2.5 MG/3ML) 0.083% nebulizer solution Take 2.5 mg by nebulization every 6 (six) hours as needed. For shortness of breath      . aspirin 81 MG tablet Take 81 mg by mouth daily.      Marland Kitchen atorvastatin (LIPITOR) 40 MG tablet Take 1 tablet (40 mg total) by mouth daily.  90 tablet  1  . clonazePAM (KLONOPIN) 1 MG tablet TAKE 1 TABLET THREE TIMES A DAY  90 tablet  0  . cyclobenzaprine (FLEXERIL) 10 MG tablet TAKE 1 TABLETBY MOUTH EVERY 6 HOURS AS NEEDED  90 tablet  0  . diphenhydrAMINE (BENADRYL) 50 MG capsule Take 50 mg by mouth every 6 (six) hours as needed for itching.      . divalproex (DEPAKOTE ER) 500 MG 24 hr tablet Take 2 tablets (1,000 mg total) by mouth at bedtime.  180 tablet  1  . ibuprofen (ADVIL,MOTRIN) 200 MG tablet Take 200 mg by mouth every 6 (six) hours as needed.      Marland Kitchen  Multiple Vitamin (MULTIVITAMIN) tablet Take 1 tablet by mouth daily.      . ciprofloxacin (CIPRO) 500 MG tablet Take 1 tablet (500 mg total) by mouth 2 (two) times daily.  20 tablet  0   No current facility-administered medications for this visit.   REVIEW OF SYSTEMS: [name] ] denotes positive finding; [  ] denotes negative finding  CARDIOVASCULAR:  [ ]  chest pain   [ ]  chest pressure   [ ]  palpitations   [ ]  orthopnea   [name] ] dyspnea on exertion   [name] ] claudication   [ ]  rest pain   [ ]  DVT   [ ]  phlebitis PULMONARY:   [ ]  productive cough   [name] ] asthma   [name] ] wheezing NEUROLOGIC:   [ ]  weakness  [ ]  paresthesias  [ ]  aphasia  [ ]  amaurosis  [ ]  dizziness HEMATOLOGIC:   [ ]  bleeding problems   [ ]  clotting disorders MUSCULOSKELETAL:  [ ]  joint pain   [ ]   joint swelling [ ]  leg swelling GASTROINTESTINAL: [ ]   blood in stool  [ ]   hematemesis GENITOURINARY:  [ ]   dysuria  [ ]   hematuria PSYCHIATRIC:  [ ]  history of major depression INTEGUMENTARY:  [ ]  rashes  [ ]  ulcers CONSTITUTIONAL:  [ ]  fever   [ ]  chills  PHYSICAL EXAM: Filed Vitals:   06/14/13 1340  BP: 121/68  Pulse: 99  Height: 4\' 9"  (1.448 m)  Weight: 136 lb (61.689 kg)  SpO2: 99%   Body mass index is 29.42 kg/(m^2). GENERAL: The patient is a well-nourished female, in no acute distress. The vital signs are documented above. CARDIOVASCULAR: There is a regular rate and rhythm. I do not detect carotid bruits. I cannot palpate femoral pulses. I cannot palpate popliteal or pedal pulses. PULMONARY: There is good air exchange bilaterally without wheezing or rales. ABDOMEN: Soft and non-tender with normal pitched bowel sounds.  MUSCULOSKELETAL: There are no major deformities or cyanosis. NEUROLOGIC: No focal weakness or paresthesias are detected. SKIN: There are no ulcers or rashes noted. She has no ischemic ulcers. PSYCHIATRIC: The patient has a normal affect.  DATA:  I have independently interpreted her arterial Doppler study today which shows monophasic Doppler signals in both feet with an ABI of 45% on the right and 46% on the left.  MEDICAL ISSUES: This patient has a known severe distal aortic stenosis. We will continue with nonoperative treatment given that she would be at increased risk for surgery or endovascular intervention given the small size of her arteries. We've discussed the importance of getting off tobacco again. I've encouraged her to get on a structured walking program. I'll see her back in 9 months. I have ordered follow up ABIs for that time. She knows to call sooner if she has problems.  Kaedan Richert S [facility]: [phone]

## 2013-06-16 ENCOUNTER — Other Ambulatory Visit: Payer: Self-pay | Admitting: *Deleted

## 2013-06-16 DIAGNOSIS — I739 Peripheral vascular disease, unspecified: Secondary | ICD-10-CM

## 2013-06-28 ENCOUNTER — Ambulatory Visit: Payer: Medicare Other | Admitting: Family Medicine

## 2013-07-03 ENCOUNTER — Other Ambulatory Visit: Payer: Self-pay | Admitting: Family Medicine

## 2013-07-03 NOTE — Telephone Encounter (Signed)
Last refill 06/01/13  OK refill?

## 2013-07-04 ENCOUNTER — Other Ambulatory Visit: Payer: Self-pay | Admitting: Family Medicine

## 2013-07-04 NOTE — Telephone Encounter (Signed)
rx called in

## 2013-07-04 NOTE — Telephone Encounter (Signed)
Last refill 06/01/13 #90  Last OV 03/20/13  OK refill

## 2013-07-04 NOTE — Telephone Encounter (Signed)
ok 

## 2013-07-06 ENCOUNTER — Ambulatory Visit: Payer: Medicare Other | Admitting: Family Medicine

## 2013-08-03 ENCOUNTER — Other Ambulatory Visit: Payer: Self-pay | Admitting: Family Medicine

## 2013-08-03 NOTE — Telephone Encounter (Signed)
ok 

## 2013-08-03 NOTE — Telephone Encounter (Signed)
Rx's called in . 

## 2013-08-03 NOTE — Telephone Encounter (Signed)
Ok to refill 

## 2013-08-24 ENCOUNTER — Ambulatory Visit: Payer: Medicare PPO | Admitting: Family Medicine

## 2013-08-30 ENCOUNTER — Other Ambulatory Visit: Payer: Self-pay | Admitting: Family Medicine

## 2013-08-30 NOTE — Telephone Encounter (Signed)
?   OK to Refill  

## 2013-08-31 NOTE — Telephone Encounter (Signed)
rx's called in . 

## 2013-08-31 NOTE — Telephone Encounter (Signed)
ok 

## 2013-09-05 ENCOUNTER — Encounter: Payer: Self-pay | Admitting: Family Medicine

## 2013-09-05 ENCOUNTER — Ambulatory Visit (INDEPENDENT_AMBULATORY_CARE_PROVIDER_SITE_OTHER): Payer: Medicare PPO | Admitting: Family Medicine

## 2013-09-05 VITALS — BP 118/80 | HR 78 | Temp 97.3°F | Resp 20 | Wt 144.0 lb

## 2013-09-05 DIAGNOSIS — E669 Obesity, unspecified: Secondary | ICD-10-CM

## 2013-09-05 DIAGNOSIS — Z23 Encounter for immunization: Secondary | ICD-10-CM

## 2013-09-05 DIAGNOSIS — E785 Hyperlipidemia, unspecified: Secondary | ICD-10-CM

## 2013-09-05 LAB — CBC WITH DIFFERENTIAL/PLATELET
Basophils Relative: 1 % (ref 0–1)
Eosinophils Absolute: 0.4 10*3/uL (ref 0.0–0.7)
Eosinophils Relative: 8 % — ABNORMAL HIGH (ref 0–5)
Hemoglobin: 12.8 g/dL (ref 12.0–15.0)
MCH: 31.4 pg (ref 26.0–34.0)
MCHC: 33.7 g/dL (ref 30.0–36.0)
Monocytes Relative: 9 % (ref 3–12)
Neutrophils Relative %: 39 % — ABNORMAL LOW (ref 43–77)
Platelets: 224 10*3/uL (ref 150–400)

## 2013-09-05 LAB — COMPLETE METABOLIC PANEL WITH GFR
ALT: 15 U/L (ref 0–35)
CO2: 31 mEq/L (ref 19–32)
Calcium: 8.9 mg/dL (ref 8.4–10.5)
Chloride: 105 mEq/L (ref 96–112)
GFR, Est African American: >89 mL/min
Sodium: 138 mEq/L (ref 135–145)
Total Protein: 6.5 g/dL (ref 6.0–8.3)

## 2013-09-05 LAB — LIPID PANEL
Cholesterol: 201 mg/dL — ABNORMAL HIGH (ref 0–200)
VLDL: 57 mg/dL — ABNORMAL HIGH (ref 0–40)

## 2013-09-05 MED ORDER — ORLISTAT 120 MG PO CAPS: 120.0000 mg | ORAL_CAPSULE | Freq: Three times a day (TID) | ORAL | Status: DC

## 2013-09-05 MED ORDER — EPINEPHRINE 0.3 MG/0.3ML IJ SOAJ: 0.3000 mg | Freq: Once | INTRAMUSCULAR | Status: DC

## 2013-09-05 NOTE — Progress Notes (Signed)
Subjective:    Patient ID: Felicia [name], female    DOB: [birth year], 47 y.o.   MRN: [id number]  HPI Patient is a 47 year old white female who has a history of hyperlipidemia currently on atorvastatin. She denies any myalgias right upper quadrant pain. She is overdue for fasting lipid panel and CMP. Chest is history of bipolar disorder. She's currently on Depakote 1000 mg by mouth each bedtime and Klonopin 1 mg 3 times a day when necessary. This seems to control her anxiety and her mood swings well. She denies any depression. She is concerned about weight gain. She is exercising on a daily basis. She is trying to restrict her calories to 1500 today. She's been unsuccessful in losing weight. She is interested in medication may help her lose weight. She is also resumed smoking. She has a history of peripheral vascular disease.  He has a history of a right carotid bruit that was negative on carotid Dopplers. She also has a slight murmur that may explain a carotid bruit. Past Medical History  Diagnosis Date  . Asthma   . PUD (peptic ulcer disease)   . Anxiety   . Bipolar 1 disorder   . Insomnia   . Hyperlipidemia   . Anemia   . Atrial fibrillation    Current Outpatient Prescriptions on File Prior to Visit  Medication Sig Dispense Refill  . albuterol (PROVENTIL HFA;VENTOLIN HFA) 108 (90 BASE) MCG/ACT inhaler Inhale 2 puffs into the lungs every 6 (six) hours as needed. For shortness of breath      . albuterol (PROVENTIL) (2.5 MG/3ML) 0.083% nebulizer solution Take 2.5 mg by nebulization every 6 (six) hours as needed. For shortness of breath      . aspirin 81 MG tablet Take 81 mg by mouth daily.      Marland Kitchen atorvastatin (LIPITOR) 40 MG tablet Take 1 tablet (40 mg total) by mouth daily.  90 tablet  1  . clonazePAM (KLONOPIN) 1 MG tablet TAKE 1 TABLET BY MOUTH 3 TIMES A DAY AS NEEDED  90 tablet  0  . cyclobenzaprine (FLEXERIL) 10 MG tablet TAKE 1 TABLET BY MOUTH EVERY 6 HOURS AS NEEDED  90 tablet  0  .  diphenhydrAMINE (BENADRYL) 50 MG capsule Take 50 mg by mouth every 6 (six) hours as needed for itching.      . divalproex (DEPAKOTE ER) 500 MG 24 hr tablet Take 2 tablets (1,000 mg total) by mouth at bedtime.  180 tablet  1  . ibuprofen (ADVIL,MOTRIN) 200 MG tablet Take 200 mg by mouth every 6 (six) hours as needed.      . Multiple Vitamin (MULTIVITAMIN) tablet Take 1 tablet by mouth daily.       No current facility-administered medications on file prior to visit.   Allergies  Allergen Reactions  . Cephalosporins Hives  . Codeine Nausea Only  . Doxycycline Hives  . Tetracyclines & Related Hives  . Reflex Pain Relief [Menthol] Rash   History   Social History  . Marital Status: Married    Spouse Name: N/A    Number of Children: N/A  . Years of Education: N/A   Occupational History  . Not on file.   Social History Main Topics  . Smoking status: Light Tobacco Smoker -- 0.50 packs/day for 30 years    Types: Cigarettes    Last Attempt to Quit: 07/13/2012  . Smokeless tobacco: Never Used     Comment: pt states that she is dealing with some stressful events  in her life and as a result has began smoking about 3-4 cigs per day  . Alcohol Use: No  . Drug Use: No  . Sexual Activity: Not on file   Other Topics Concern  . Not on file   Social History Narrative  . No narrative on file      Review of Systems  All other systems reviewed and are negative.       Objective:   Physical Exam  Vitals reviewed. Constitutional: She appears well-developed and well-nourished.  Neck: Neck supple. No JVD present. No thyromegaly present.  Cardiovascular: Normal rate, regular rhythm and intact distal pulses.   Murmur heard. Pulmonary/Chest: Effort normal and breath sounds normal. No respiratory distress. She has no wheezes. She has no rales. She exhibits no tenderness.  Abdominal: Soft. Bowel sounds are normal. She exhibits no distension. There is no tenderness. There is no rebound and no  guarding.  Musculoskeletal: She exhibits no edema.  Lymphadenopathy:    She has no cervical adenopathy.   1/6 systolic ejection murmur. Right carotid bruit       Assessment & Plan:  1. Obesity, unspecified I recommended was that when her 20 mg by mouth 3 times a day for weight loss - orlistat (XENICAL) 120 MG capsule; Take 1 capsule (120 mg total) by mouth 3 (three) times daily with meals.  Dispense: 120 capsule; Refill: 1  2. HLD (hyperlipidemia) Check a CMP and fasting lipid panel. LDL is less than 100. Counseled smoking cessation. Blood pressure today is well controlled. Given her history of tobacco abuse also updated her flu - COMPLETE METABOLIC PANEL WITH GFR - CBC with Differential - Lipid panel  3. bipolar disorder. Currently well controlled on Depakote extended release 1000 mg by mouth each bedtime and Klonopin 1 mg by mouth 3 times a day to make no changes to medication at this time. I will check a CBC and CMP to evaluate for side effects due to the Depakote.

## 2013-09-05 NOTE — Addendum Note (Signed)
Addended by: Legrand Rams B on: 09/05/2013 12:18 PM   Modules accepted: Orders

## 2013-09-12 MED ORDER — ROSUVASTATIN CALCIUM 20 MG PO TABS: 20.0000 mg | ORAL_TABLET | Freq: Every day | ORAL | Status: DC

## 2013-09-12 NOTE — Addendum Note (Signed)
Addended by: Elvina Mattes T on: 09/12/2013 09:52 AM   Modules accepted: Orders

## 2013-09-27 ENCOUNTER — Other Ambulatory Visit: Payer: Self-pay | Admitting: Family Medicine

## 2013-09-28 NOTE — Telephone Encounter (Signed)
ok 

## 2013-09-28 NOTE — Telephone Encounter (Signed)
?   OK to Refill  

## 2013-09-29 ENCOUNTER — Telehealth: Payer: Self-pay | Admitting: Family Medicine

## 2013-09-29 MED ORDER — ROSUVASTATIN CALCIUM 20 MG PO TABS: 20.0000 mg | ORAL_TABLET | Freq: Every day | ORAL | Status: DC

## 2013-09-29 NOTE — Telephone Encounter (Signed)
Yes she was switched to crestor 20 poqday.

## 2013-09-29 NOTE — Telephone Encounter (Signed)
Flexeril and Clonazepam are at pharmacy from 11/5. Verified per pharmacist.  Are you changing her cholesterol meds??

## 2013-09-29 NOTE — Telephone Encounter (Signed)
Patient needs her Flexaril and her Klonopin refilled and Dr. Tanya Nones was going to switch her Lipitor to Creator but, there has been no prescription called in to [facility] [address].

## 2013-10-25 ENCOUNTER — Other Ambulatory Visit: Payer: Self-pay | Admitting: Family Medicine

## 2013-10-26 ENCOUNTER — Telehealth: Payer: Self-pay | Admitting: Family Medicine

## 2013-10-26 MED ORDER — ATORVASTATIN CALCIUM 40 MG PO TABS: 40.0000 mg | ORAL_TABLET | Freq: Every day | ORAL | Status: DC

## 2013-10-26 NOTE — Telephone Encounter (Signed)
ok 

## 2013-10-26 NOTE — Telephone Encounter (Signed)
Rx Refilled  

## 2013-10-26 NOTE — Telephone Encounter (Signed)
Pt called yesterday afternoon and left a voicemail wondering if she could have another cholesterol medication that is not as expensive She is about to run out Call back number is [phone]

## 2013-10-26 NOTE — Telephone Encounter (Signed)
?   OK to Refill  

## 2013-10-26 NOTE — Telephone Encounter (Signed)
She could try lipitor 40 qd

## 2013-11-24 ENCOUNTER — Other Ambulatory Visit: Payer: Self-pay | Admitting: Family Medicine

## 2013-11-24 NOTE — Telephone Encounter (Signed)
ok 

## 2013-11-24 NOTE — Telephone Encounter (Signed)
?   OK to Refill  

## 2013-11-25 ENCOUNTER — Other Ambulatory Visit: Payer: Self-pay | Admitting: Family Medicine

## 2013-11-27 NOTE — Telephone Encounter (Signed)
Ok to refill 

## 2013-11-27 NOTE — Telephone Encounter (Signed)
ok 

## 2013-12-21 ENCOUNTER — Other Ambulatory Visit: Payer: Self-pay | Admitting: Family Medicine

## 2013-12-21 ENCOUNTER — Telehealth: Payer: Self-pay | Admitting: Family Medicine

## 2013-12-21 MED ORDER — METAXALONE 800 MG PO TABS: 800.0000 mg | ORAL_TABLET | Freq: Three times a day (TID) | ORAL | Status: DC

## 2013-12-21 NOTE — Telephone Encounter (Signed)
Discontinue flexeril and replace with skelaxin 800 mg potid prn muscle spasm.

## 2013-12-21 NOTE — Telephone Encounter (Signed)
Pt states that the flexeril is not working any longer for her muscle spasms and was wondering if we could change her medication. She is having increased spasms and pain in her back and shoulder area. She had taken skexalin in the past that seemed to help a lot.

## 2013-12-21 NOTE — Telephone Encounter (Signed)
Med sent to pharm and Patient aware

## 2013-12-22 ENCOUNTER — Encounter: Payer: Self-pay | Admitting: Family Medicine

## 2013-12-22 NOTE — Telephone Encounter (Signed)
This was called in 12/21/13

## 2013-12-22 NOTE — Telephone Encounter (Signed)
Pt is needing a refill on her Klonopin Call back number is [phone]

## 2013-12-22 NOTE — Telephone Encounter (Signed)
?   OK to Refill  

## 2013-12-22 NOTE — Telephone Encounter (Signed)
ok 

## 2013-12-22 NOTE — Telephone Encounter (Signed)
rx called in

## 2013-12-28 NOTE — Telephone Encounter (Signed)
This encounter was created in error - please disregard.

## 2014-01-09 ENCOUNTER — Other Ambulatory Visit: Payer: Self-pay | Admitting: Family Medicine

## 2014-01-16 ENCOUNTER — Ambulatory Visit: Payer: Medicare PPO | Admitting: Family Medicine

## 2014-01-18 ENCOUNTER — Other Ambulatory Visit: Payer: Self-pay | Admitting: Family Medicine

## 2014-01-19 ENCOUNTER — Telehealth: Payer: Self-pay | Admitting: Family Medicine

## 2014-01-19 NOTE — Telephone Encounter (Signed)
Call back number is [phone] Pharmacy [facility] [address] Pt is needing refill on clonazePAM (KLONOPIN) 1 MG tablet Pt will run out tomorrow

## 2014-01-19 NOTE — Telephone Encounter (Signed)
ok 

## 2014-01-19 NOTE — Telephone Encounter (Signed)
?   OK to Refill  

## 2014-01-19 NOTE — Telephone Encounter (Signed)
This was called in earlier this morning.

## 2014-01-31 ENCOUNTER — Other Ambulatory Visit: Payer: Self-pay | Admitting: Family Medicine

## 2014-02-12 ENCOUNTER — Encounter: Payer: Self-pay | Admitting: Family Medicine

## 2014-02-12 ENCOUNTER — Ambulatory Visit (INDEPENDENT_AMBULATORY_CARE_PROVIDER_SITE_OTHER): Payer: Medicare PPO | Admitting: Family Medicine

## 2014-02-12 VITALS — BP 140/100 | HR 78 | Temp 97.3°F | Resp 16 | Ht <= 58 in | Wt 140.0 lb

## 2014-02-12 DIAGNOSIS — I10 Essential (primary) hypertension: Secondary | ICD-10-CM

## 2014-02-12 DIAGNOSIS — E785 Hyperlipidemia, unspecified: Secondary | ICD-10-CM

## 2014-02-12 DIAGNOSIS — M7500 Adhesive capsulitis of unspecified shoulder: Secondary | ICD-10-CM

## 2014-02-12 DIAGNOSIS — M549 Dorsalgia, unspecified: Secondary | ICD-10-CM

## 2014-02-12 LAB — CBC WITH DIFFERENTIAL/PLATELET
Basophils Absolute: 0 10*3/uL (ref 0.0–0.1)
Basophils Relative: 0 % (ref 0–1)
EOS PCT: 6 % — AB (ref 0–5)
Eosinophils Absolute: 0.4 10*3/uL (ref 0.0–0.7)
HCT: 38.5 % (ref 36.0–46.0)
HEMOGLOBIN: 13.2 g/dL (ref 12.0–15.0)
LYMPHS ABS: 2.7 10*3/uL (ref 0.7–4.0)
LYMPHS PCT: 45 % (ref 12–46)
MCH: 30.6 pg (ref 26.0–34.0)
MCHC: 34.3 g/dL (ref 30.0–36.0)
MCV: 89.3 fL (ref 78.0–100.0)
MONOS PCT: 6 % (ref 3–12)
Monocytes Absolute: 0.4 10*3/uL (ref 0.1–1.0)
NEUTROS PCT: 43 % (ref 43–77)
Neutro Abs: 2.6 10*3/uL (ref 1.7–7.7)
PLATELETS: 309 10*3/uL (ref 150–400)
RBC: 4.31 MIL/uL (ref 3.87–5.11)
RDW: 14.6 % (ref 11.5–15.5)
WBC: 6 10*3/uL (ref 4.0–10.5)

## 2014-02-12 LAB — COMPLETE METABOLIC PANEL WITH GFR
ALK PHOS: 48 U/L (ref 39–117)
ALT: 17 U/L (ref 0–35)
AST: 14 U/L (ref 0–37)
Albumin: 4 g/dL (ref 3.5–5.2)
BILIRUBIN TOTAL: 0.3 mg/dL (ref 0.2–1.2)
BUN: 13 mg/dL (ref 6–23)
CO2: 28 mEq/L (ref 19–32)
Calcium: 9.1 mg/dL (ref 8.4–10.5)
Chloride: 104 mEq/L (ref 96–112)
Creat: 0.76 mg/dL (ref 0.50–1.10)
GFR, Est African American: >89 mL/min
Glucose, Bld: 86 mg/dL (ref 70–99)
Potassium: 4 mEq/L (ref 3.5–5.3)
Sodium: 143 mEq/L (ref 135–145)
Total Protein: 6.6 g/dL (ref 6.0–8.3)

## 2014-02-12 LAB — LIPID PANEL
CHOL/HDL RATIO: 5.8 ratio
CHOLESTEROL: 216 mg/dL — AB (ref 0–200)
HDL: 37 mg/dL — AB (ref >39)
LDL Cholesterol: 136 mg/dL — ABNORMAL HIGH (ref 0–99)
Triglycerides: 217 mg/dL — ABNORMAL HIGH (ref <150)
VLDL: 43 mg/dL — ABNORMAL HIGH (ref 0–40)

## 2014-02-12 MED ORDER — CYCLOBENZAPRINE HCL 10 MG PO TABS: 10.0000 mg | ORAL_TABLET | Freq: Three times a day (TID) | ORAL | Status: DC | PRN

## 2014-02-12 NOTE — Progress Notes (Signed)
Subjective:    Patient ID: Felicia [name], female    DOB: 1966/05/24, 48 y.o.   MRN: [id number]  HPI Patient has a history of right shoulder surgery by Dr. Ranell Patrick. This is a +2 years ago. It is due to a rotator cuff tear. Last few months, the patient developed aggressive pain in her right shoulder. She is unable to abduct the arm greater than 90. It aches and throbs constantly. Internal and external rotation hurts significantly. The patient is basically holding her arms still and refuses to move it. She is also developing secondary muscle spasms around the right shoulder. She's also complaining of numbness and tingling in her right great toe. She is also due for a medication check. Her blood pressure is elevated today at 140/100. She is due for a CMP and fasting lipid panel. She reports muscle cramps in her legs on atorvastatin. Right to switch off of his medication.  She is also complaining of low back pain and worsening muscle spasms. She does not believe skelaxin is helping. Past Medical History  Diagnosis Date  . Asthma   . PUD (peptic ulcer disease)   . Anxiety   . Bipolar 1 disorder   . Insomnia   . Hyperlipidemia   . Anemia   . Atrial fibrillation    Current Outpatient Prescriptions on File Prior to Visit  Medication Sig Dispense Refill  . albuterol (PROVENTIL HFA;VENTOLIN HFA) 108 (90 BASE) MCG/ACT inhaler Inhale 2 puffs into the lungs every 6 (six) hours as needed. For shortness of breath      . albuterol (PROVENTIL) (2.5 MG/3ML) 0.083% nebulizer solution Take 2.5 mg by nebulization every 6 (six) hours as needed. For shortness of breath      . aspirin 81 MG tablet Take 81 mg by mouth daily.      . clonazePAM (KLONOPIN) 1 MG tablet TAKE 1 TABLET BY MOUTH 3 TIMES A DAY AS NEEDED  90 tablet  2  . diphenhydrAMINE (BENADRYL) 50 MG capsule Take 50 mg by mouth every 6 (six) hours as needed for itching.      . divalproex (DEPAKOTE ER) 500 MG 24 hr tablet TAKE 2 TABLETS BY MOUTH DAILY  AT BEDTIME  180 tablet  1  . EPINEPHrine (EPI-PEN) 0.3 mg/0.3 mL SOAJ injection Inject 0.3 mLs (0.3 mg total) into the muscle once.  1 Device  2  . ibuprofen (ADVIL,MOTRIN) 200 MG tablet Take 200 mg by mouth every 6 (six) hours as needed.      . metaxalone (SKELAXIN) 800 MG tablet Take 1 tablet (800 mg total) by mouth 3 (three) times daily.  90 tablet  2  . Multiple Vitamin (MULTIVITAMIN) tablet Take 1 tablet by mouth daily.      Marland Kitchen atorvastatin (LIPITOR) 40 MG tablet Take 1 tablet (40 mg total) by mouth daily.  30 tablet  3   No current facility-administered medications on file prior to visit.   Allergies  Allergen Reactions  . Cephalosporins Hives  . Codeine Nausea Only  . Doxycycline Hives  . Tetracyclines & Related Hives  . Reflex Pain Relief [Menthol] Rash   History   Social History  . Marital Status: Married    Spouse Name: N/A    Number of Children: N/A  . Years of Education: N/A   Occupational History  . Not on file.   Social History Main Topics  . Smoking status: Light Tobacco Smoker -- 0.50 packs/day for 30 years    Types: Cigarettes  Last Attempt to Quit: 07/13/2012  . Smokeless tobacco: Never Used     Comment: pt states that she is dealing with some stressful events in her life and as a result has began smoking about 3-4 cigs per day  . Alcohol Use: No  . Drug Use: No  . Sexual Activity: Not on file   Other Topics Concern  . Not on file   Social History Narrative  . No narrative on file      Review of Systems  All other systems reviewed and are negative.       Objective:   Physical Exam  Vitals reviewed. Constitutional: She is oriented to person, place, and time.  Cardiovascular: Normal rate, regular rhythm and normal heart sounds.  Exam reveals no gallop and no friction rub.   No murmur heard. Pulmonary/Chest: Effort normal and breath sounds normal. No respiratory distress. She has no wheezes. She has no rales. She exhibits no tenderness.    Abdominal: Soft. Bowel sounds are normal. She exhibits no distension. There is no tenderness. There is no rebound and no guarding.  Musculoskeletal: She exhibits tenderness. She exhibits no edema.       Right shoulder: She exhibits decreased range of motion, tenderness, bony tenderness, pain and spasm. She exhibits no swelling and no effusion.  Neurological: She is alert and oriented to person, place, and time. She displays normal reflexes. No cranial nerve deficit. She exhibits normal muscle tone. Coordination normal.          Assessment & Plan:  1. HLD (hyperlipidemia) Check CMP and fasting lipid panel. Patient's goal LDL is less than 70 given her history of peripheral vascular disease. Labs are within normal range, I will discontinue Lipitor and try switching the patient to zetia due to myalgias. - CBC with Differential - Lipid panel - COMPLETE METABOLIC PANEL WITH GFR  2. Frozen shoulder Using sterile technique, I injected the right shoulder with 2 cc of lidocaine, 2 cc of Marcaine, and 2 cc of 40 mg per mL Depo-Medrol.  Patient tolerated procedure well without complications.  3. Back pain Discontinue Skelaxin and switch to Flexeril 10 mg every 8 hours when necessary muscle spasms  4. HTN (hypertension) Pressure is elevated today, I will check a CMP. There is no renal abnormalities or hypokalemia I will start the patient on hydrochlorothiazide 25 mg by mouth daily.

## 2014-02-12 NOTE — Addendum Note (Signed)
Addended by: Legrand RamsWILLIS, SANDY B on: 02/12/2014 09:07 AM   Modules accepted: Orders

## 2014-02-13 ENCOUNTER — Telehealth: Payer: Self-pay | Admitting: Family Medicine

## 2014-02-13 DIAGNOSIS — E785 Hyperlipidemia, unspecified: Secondary | ICD-10-CM

## 2014-02-13 DIAGNOSIS — Z79899 Other long term (current) drug therapy: Secondary | ICD-10-CM

## 2014-02-13 MED ORDER — EZETIMIBE 10 MG PO TABS: 10.0000 mg | ORAL_TABLET | Freq: Every day | ORAL | Status: DC

## 2014-02-13 NOTE — Telephone Encounter (Signed)
Spoke to patient about lab results.  Has stopped Lipitor due to muscle cramps in legs.  Rx for Zetia to pharmacy.  3 mth labs ordered

## 2014-02-13 NOTE — Telephone Encounter (Signed)
Message copied by Donne AnonPLUMMER, KIM M on Tue Feb 13, 2014 10:35 AM ------      Message from: Lynnea FerrierPICKARD, WARREN      Created: Tue Feb 13, 2014  7:31 AM       Cholesterol is too high. If she discontinues Lipitor, I would replace it with zetia 10 mg poqday and recheck labs in 3 months. ------

## 2014-02-21 ENCOUNTER — Telehealth: Payer: Self-pay | Admitting: Family Medicine

## 2014-02-21 MED ORDER — CHLOROQUINE PHOSPHATE 250 MG PO TABS: 250.0000 mg | ORAL_TABLET | Freq: Every day | ORAL | Status: DC

## 2014-02-21 NOTE — Telephone Encounter (Signed)
Pt states that she is taking the Flexeril but she is having horrible leg and feet cramps that last on an average of 30-40 minutes and bring her to tears. She is also having on deep in her right arm next to her armpit. She is wanting some pain medication to help with this.

## 2014-02-21 NOTE — Telephone Encounter (Signed)
Per [facility] try Chloroquine 250 qd x 2 weeks and follow up in ov. Pt aware, med sent to pharm and appt made.

## 2014-02-21 NOTE — Telephone Encounter (Signed)
Message copied by Ricard DillonWILLIS, SANDY B on Wed Feb 21, 2014  1:01 PM ------      Message from: Harriet MassonOBERTS, CHELSEA N      Created: Wed Feb 21, 2014 12:29 PM      Regarding: Wanting pain meds      Contact: [phone]       Pt is wanting pain medications for her muscle spasms ------

## 2014-02-21 NOTE — Telephone Encounter (Signed)
She is on flexeril????  Where are her spasms?

## 2014-03-06 ENCOUNTER — Ambulatory Visit: Payer: Medicare PPO | Admitting: Family Medicine

## 2014-03-20 ENCOUNTER — Encounter: Payer: Self-pay | Admitting: Vascular Surgery

## 2014-03-21 ENCOUNTER — Ambulatory Visit (HOSPITAL_COMMUNITY)
Admission: RE | Admit: 2014-03-21 | Discharge: 2014-03-21 | Disposition: A | Discharge status: Home / Self Care | Payer: Medicare PPO | Source: Ambulatory Visit | Attending: Vascular Surgery | Admitting: Vascular Surgery

## 2014-03-21 ENCOUNTER — Encounter: Payer: Self-pay | Admitting: Vascular Surgery

## 2014-03-21 ENCOUNTER — Telehealth: Payer: Self-pay | Admitting: Family Medicine

## 2014-03-21 ENCOUNTER — Ambulatory Visit (INDEPENDENT_AMBULATORY_CARE_PROVIDER_SITE_OTHER): Payer: Medicare PPO | Admitting: Vascular Surgery

## 2014-03-21 VITALS — BP 132/57 | HR 104 | Ht <= 58 in | Wt 139.8 lb

## 2014-03-21 DIAGNOSIS — M25511 Pain in right shoulder: Secondary | ICD-10-CM

## 2014-03-21 DIAGNOSIS — Z48812 Encounter for surgical aftercare following surgery on the circulatory system: Secondary | ICD-10-CM

## 2014-03-21 DIAGNOSIS — I70219 Atherosclerosis of native arteries of extremities with intermittent claudication, unspecified extremity: Secondary | ICD-10-CM

## 2014-03-21 DIAGNOSIS — M62838 Other muscle spasm: Secondary | ICD-10-CM

## 2014-03-21 DIAGNOSIS — I739 Peripheral vascular disease, unspecified: Secondary | ICD-10-CM | POA: Insufficient documentation

## 2014-03-21 NOTE — Assessment & Plan Note (Signed)
The patient has bilateral lower extremity claudication which has been gradually worsening. Her ABIs have dropped slightly. She denies any rest pain. She has cut back significantly on smoking but does continue to smoke. We have again discussed the option of considering aortofemoral bypass grafting. She would be at increased risk for surgery given her very small blood vessels. She states that she would like to continue to try a walking program especially now that the weather is warm a knot. I've also again discussed with her the importance of getting off nicotine completely. I'll see her back in 6 months. When she returns at that time we will obtain a CT angiogram as her arteriogram was back in 2013. She knows to call sooner if she has problems.

## 2014-03-21 NOTE — Progress Notes (Signed)
[facility] [place]  Patient name: Felicia [name] Mcilvain MRN: [id number] DOB: 11/04/1966 Sex: female  REASON FOR VISIT: Follow up of peripheral vascular disease  HPI: Felicia [name] Aker is a 48 y.o. female who I last saw on 06/14/2013. She has severe aortoiliac occlusive disease. Based on previous arteriogram she has a severe distal aortic stenosis and his small arteries. This makes her a less than ideal candidate for an endovascular approach or open aortic surgery. We have been working on tobacco cessation and a structured walking program in hopes of avoiding revascularization. She comes in for a 9 month follow up visit.  Past Medical History  Diagnosis Date  . Asthma   . PUD (peptic ulcer disease)   . Anxiety   . Bipolar 1 disorder   . Insomnia   . Hyperlipidemia   . Anemia   . Atrial fibrillation    Family History  Problem Relation Age of Onset  . Heart disease Mother   . Diabetes Mother   . Hyperlipidemia Mother   . Hypertension Mother   . Heart attack Mother   . Heart disease Father   . Diabetes Father   . Heart attack Father   . Diabetes Brother    SOCIAL HISTORY: History  Substance Use Topics  . Smoking status: Light Tobacco Smoker -- 0.50 packs/day for 30 years    Types: Cigarettes    Last Attempt to Quit: 07/13/2012  . Smokeless tobacco: Never Used     Comment: pt states that she is dealing with some stressful events in her life and as a result has began smoking about 3-4 cigs per day  . Alcohol Use: No   Allergies  Allergen Reactions  . Cephalosporins Hives  . Codeine Nausea Only  . Doxycycline Hives  . Tetracyclines & Related Hives  . Reflex Pain Relief [Menthol] Rash   Current Outpatient Prescriptions  Medication Sig Dispense Refill  . albuterol (PROVENTIL HFA;VENTOLIN HFA) 108 (90 BASE) MCG/ACT inhaler Inhale 2 puffs into the lungs every 6 (six) hours as needed. For shortness of breath      . albuterol (PROVENTIL) (2.5 MG/3ML) 0.083%  nebulizer solution Take 2.5 mg by nebulization every 6 (six) hours as needed. For shortness of breath      . aspirin 81 MG tablet Take 81 mg by mouth daily.      . chloroquine (ARALEN) 250 MG tablet Take 1 tablet (250 mg total) by mouth daily.  30 tablet  0  . clonazePAM (KLONOPIN) 1 MG tablet TAKE 1 TABLET BY MOUTH 3 TIMES A DAY AS NEEDED  90 tablet  2  . cyclobenzaprine (FLEXERIL) 10 MG tablet Take 1 tablet (10 mg total) by mouth 3 (three) times daily as needed for muscle spasms.  90 tablet  2  . diphenhydrAMINE (BENADRYL) 50 MG capsule Take 50 mg by mouth every 6 (six) hours as needed for itching.      . divalproex (DEPAKOTE ER) 500 MG 24 hr tablet TAKE 2 TABLETS BY MOUTH DAILY AT BEDTIME  180 tablet  1  . EPINEPHrine (EPI-PEN) 0.3 mg/0.3 mL SOAJ injection Inject 0.3 mLs (0.3 mg total) into the muscle once.  1 Device  2  . ezetimibe (ZETIA) 10 MG tablet Take 1 tablet (10 mg total) by mouth daily.  30 tablet  2  . ibuprofen (ADVIL,MOTRIN) 200 MG tablet Take 200 mg by mouth every 6 (six) hours as needed.      . Multiple Vitamin (MULTIVITAMIN) tablet  Take 1 tablet by mouth daily.       No current facility-administered medications for this visit.   REVIEW OF SYSTEMS: [name][X ] denotes positive finding; [  ] denotes negative finding  CARDIOVASCULAR:  [ ]  chest pain   [ ]  chest pressure   [ ]  palpitations   [ ]  orthopnea   [ ]  dyspnea on exertion   [name][X ] claudication   [ ]  rest pain   [ ]  DVT   [ ]  phlebitis PULMONARY:   [ ]  productive cough   [name][X ] asthma   [name][X ] wheezing NEUROLOGIC:   [ ]  weakness  [ ]  paresthesias  [ ]  aphasia  [ ]  amaurosis  [ ]  dizziness HEMATOLOGIC:   [ ]  bleeding problems   [ ]  clotting disorders MUSCULOSKELETAL:  [ ]  joint pain   [ ]  joint swelling [ ]  leg swelling GASTROINTESTINAL: [ ]   blood in stool  [ ]   hematemesis GENITOURINARY:  [name][X ]  dysuria  [ ]   hematuria PSYCHIATRIC:  [ ]  history of major depression INTEGUMENTARY:  [ ]  rashes  [ ]  ulcers CONSTITUTIONAL:  [ ]  fever    [ ]  chills  PHYSICAL EXAM: Filed Vitals:   03/21/14 1356  BP: 132/57  Pulse: 104  Height: 4\' 9"  (1.448 m)  Weight: 139 lb 12.8 oz (63.413 kg)  SpO2: 95%   Body mass index is 30.24 kg/(m^2). GENERAL: The patient is a well-nourished female, in no acute distress. The vital signs are documented above. CARDIOVASCULAR: There is a regular rate and rhythm. I do not detect carotid bruits. I cannot palpate femoral pulses. I cannot palpate pedal pulses. PULMONARY: There is good air exchange bilaterally without wheezing or rales. ABDOMEN: Soft and non-tender with normal pitched bowel sounds.  MUSCULOSKELETAL: There are no major deformities or cyanosis. NEUROLOGIC: No focal weakness or paresthesias are detected. SKIN: There are no ulcers or rashes noted. PSYCHIATRIC: The patient has a normal affect.  DATA:  I have independently interpreted her arterial Doppler study which shows monophasic Doppler signals in the dorsalis pedis and posterior tibial positions bilaterally. ABI on the right is 35%. ABI on the left is 37%.  Previous ABIs in July of 2014 were 45% on the right and 46% on the left.  MEDICAL ISSUES:  Atherosclerosis of native arteries of the extremities with intermittent claudication The patient has bilateral lower extremity claudication which has been gradually worsening. Her ABIs have dropped slightly. She denies any rest pain. She has cut back significantly on smoking but does continue to smoke. We have again discussed the option of considering aortofemoral bypass grafting. She would be at increased risk for surgery given her very small blood vessels. She states that she would like to continue to try a walking program especially now that the weather is warm a knot. I've also again discussed with her the importance of getting off nicotine completely. I'll see her back in 6 months. When she returns at that time we will obtain a CT angiogram as her arteriogram was back in 2013. She knows to  call sooner if she has problems.    Return in about 6 months (around 09/20/2014).  Chuck Hinthristopher S Wajiha Versteeg [facility] [place] Beeper: [phone]

## 2014-03-21 NOTE — Telephone Encounter (Signed)
Message copied by Ricard DillonWILLIS, Ashonti Leandro B on Wed Mar 21, 2014  4:02 PM ------      Message from: Malvin JohnsBULLINS, SUSAN S      Created: Wed Mar 21, 2014  8:06 AM       Patient would like for you to call her but did not leave a reason in the message please call her at [phone] ------

## 2014-03-21 NOTE — Addendum Note (Signed)
Addended by: Sharee PimpleMCCHESNEY, Kedarius Aloisi K on: 03/21/2014 04:03 PM   Modules accepted: Orders

## 2014-03-21 NOTE — Telephone Encounter (Signed)
Pt called stating that after the injection in her shoulder it felt great but now that is has wore off it is hurting and swelling again. Can you refer her to someone or does she need to come in? Also the medication you gave her for her leg spasms helped a lot but now it seems to be wearing off as well, is there anything else she can do for it?

## 2014-03-22 NOTE — Telephone Encounter (Signed)
Pt aware and will also set up PT for her leg spasms

## 2014-03-22 NOTE — Telephone Encounter (Signed)
I would first get MRI of shoulder

## 2014-04-02 ENCOUNTER — Ambulatory Visit
Admission: RE | Admit: 2014-04-02 | Discharge: 2014-04-02 | Disposition: A | Discharge status: Home / Self Care | Payer: Medicare PPO | Source: Ambulatory Visit | Attending: Family Medicine | Admitting: Family Medicine

## 2014-04-02 DIAGNOSIS — M25511 Pain in right shoulder: Secondary | ICD-10-CM

## 2014-04-17 ENCOUNTER — Other Ambulatory Visit: Payer: Self-pay | Admitting: Family Medicine

## 2014-04-17 NOTE — Telephone Encounter (Signed)
ok 

## 2014-04-17 NOTE — Telephone Encounter (Signed)
rx called in

## 2014-04-17 NOTE — Telephone Encounter (Signed)
Ok to refill??  Last office visit 02/12/2014.  Last refill 01/19/2014.

## 2014-05-06 ENCOUNTER — Other Ambulatory Visit: Payer: Self-pay | Admitting: Family Medicine

## 2014-05-09 ENCOUNTER — Telehealth: Payer: Self-pay | Admitting: Family Medicine

## 2014-05-09 MED ORDER — CHLOROQUINE PHOSPHATE 250 MG PO TABS: 250.0000 mg | ORAL_TABLET | Freq: Every day | ORAL | Status: DC

## 2014-05-09 NOTE — Telephone Encounter (Signed)
Pt went to see MD about her shoulder and he told her that he thought her shoulder was in such bad shape that surgery would not help that much and she elected not to do surgery. He did give her a RX for Tramadol 50mg  bid - tid  And she was wondering if you would refill it for her. She did say that the MD told her that we would have to refill it. If ok will send in rx and notify pt. Pt is aware that you are oot.

## 2014-05-14 MED ORDER — TRAMADOL HCL 50 MG PO TABS: 50.0000 mg | ORAL_TABLET | Freq: Two times a day (BID) | ORAL | Status: DC

## 2014-05-14 NOTE — Telephone Encounter (Signed)
Okay with tramadol 60 bid per month

## 2014-05-14 NOTE — Telephone Encounter (Signed)
Med called to pharm and pt aware 

## 2014-06-05 ENCOUNTER — Encounter: Payer: Self-pay | Admitting: Family Medicine

## 2014-06-05 ENCOUNTER — Ambulatory Visit (INDEPENDENT_AMBULATORY_CARE_PROVIDER_SITE_OTHER): Payer: Medicare PPO | Admitting: Family Medicine

## 2014-06-05 VITALS — BP 126/82 | HR 72 | Temp 97.2°F | Resp 18 | Ht <= 58 in | Wt 134.0 lb

## 2014-06-05 DIAGNOSIS — F319 Bipolar disorder, unspecified: Secondary | ICD-10-CM

## 2014-06-05 DIAGNOSIS — E785 Hyperlipidemia, unspecified: Secondary | ICD-10-CM

## 2014-06-05 NOTE — Progress Notes (Signed)
Subjective:    Patient ID: Felicia [name], female    DOB: August 20, 1966, 48 y.o.   MRN: [id number]  HPI Patient was last seen in March. At that time her fasting lipid panel showed an elevated LDL cholesterol greater than 130, decreased HDL less than 40 and elevated triglycerides. Patient began zetia 10 mg by mouth daily at that time.  She is overdue for a fasting lipid panel and CMP. She denies any myalgias or right upper quadrant pain on the medication. She is no lower having muscle cramps. She continues to take chloroquine. She is not sure if she continues to need the medication.  She does have a history of peripheral vascular disease with intermittent claudication of the legs with walking. At the present time she does not report claudication. However given the history of peripheral vascular disease, I like to keep LDL cholesterol less than 70. Chest is history of bipolar disorder which is currently well controlled with Depakote 1000 mg by mouth each bedtime. She continues to use the Klonopin on a daily basis to help control anxiety. With these 2 medications he denies any manic symptoms. She denies any depression. Her anxiety is well controlled. There is no evidence of abuse or diversion of the Klonopin. Past Medical History  Diagnosis Date  . Asthma   . PUD (peptic ulcer disease)   . Anxiety   . Bipolar 1 disorder   . Insomnia   . Hyperlipidemia   . Anemia   . Atrial fibrillation    Current Outpatient Prescriptions on File Prior to Visit  Medication Sig Dispense Refill  . albuterol (PROVENTIL HFA;VENTOLIN HFA) 108 (90 BASE) MCG/ACT inhaler Inhale 2 puffs into the lungs every 6 (six) hours as needed. For shortness of breath      . albuterol (PROVENTIL) (2.5 MG/3ML) 0.083% nebulizer solution Take 2.5 mg by nebulization every 6 (six) hours as needed. For shortness of breath      . aspirin 81 MG tablet Take 81 mg by mouth daily.      . chloroquine (ARALEN) 250 MG tablet Take 1 tablet (250 mg  total) by mouth daily.  30 tablet  1  . clonazePAM (KLONOPIN) 1 MG tablet TAKE 1 TABLET THREE TIMES A DAY AS NEEDED  90 tablet  2  . cyclobenzaprine (FLEXERIL) 10 MG tablet TAKE 1 TABLET BY MOUTH 3 TIMES A DAY AS NEEDED FOR MUSCLE SPASMS  90 tablet  2  . diphenhydrAMINE (BENADRYL) 50 MG capsule Take 50 mg by mouth every 6 (six) hours as needed for itching.      . divalproex (DEPAKOTE ER) 500 MG 24 hr tablet TAKE 2 TABLETS BY MOUTH DAILY AT BEDTIME  180 tablet  1  . EPINEPHrine (EPI-PEN) 0.3 mg/0.3 mL SOAJ injection Inject 0.3 mLs (0.3 mg total) into the muscle once.  1 Device  2  . ezetimibe (ZETIA) 10 MG tablet Take 1 tablet (10 mg total) by mouth daily.  30 tablet  2  . ibuprofen (ADVIL,MOTRIN) 200 MG tablet Take 200 mg by mouth every 6 (six) hours as needed.      . Multiple Vitamin (MULTIVITAMIN) tablet Take 1 tablet by mouth daily.      . traMADol (ULTRAM) 50 MG tablet Take 1 tablet (50 mg total) by mouth 2 (two) times daily.  60 tablet  2   No current facility-administered medications on file prior to visit.   Allergies  Allergen Reactions  . Cephalosporins Hives  . Codeine Nausea Only  .  Doxycycline Hives  . Tetracyclines & Related Hives  . Reflex Pain Relief [Menthol] Rash   History   Social History  . Marital Status: Married    Spouse Name: N/A    Number of Children: N/A  . Years of Education: N/A   Occupational History  . Not on file.   Social History Main Topics  . Smoking status: Light Tobacco Smoker -- 0.50 packs/day for 30 years    Types: Cigarettes    Last Attempt to Quit: 07/13/2012  . Smokeless tobacco: Never Used     Comment: pt states that she is dealing with some stressful events in her life and as a result has began smoking about 3-4 cigs per day  . Alcohol Use: No  . Drug Use: No  . Sexual Activity: Not on file   Other Topics Concern  . Not on file   Social History Narrative  . No narrative on file      Review of Systems  All other systems  reviewed and are negative.      Objective:   Physical Exam  Vitals reviewed. Cardiovascular: Normal rate, regular rhythm and normal heart sounds.   No murmur heard. Pulmonary/Chest: Effort normal and breath sounds normal. No respiratory distress. She has no wheezes. She has no rales.  Abdominal: Soft. Bowel sounds are normal. She exhibits no distension. There is no tenderness. There is no rebound and no guarding.  Musculoskeletal: She exhibits no edema.          Assessment & Plan:  1. HLD (hyperlipidemia) Check fasting lipid panel. LDL cholesterol be less than 70 given her history of peripheral vascular disease. I also recommended smoking cessation.  Present time her peripherovascular disease is well controlled. Have asked the patient to discontinue chloroquine to see if the cramps return. - COMPLETE METABOLIC PANEL WITH GFR - Lipid panel  2. Bipolar disorder, unspecified Continue Depakote 1000 mg by mouth each bedtime. Patient can continue to use Klonopin 1 mg by mouth 3 times a day as needed for anxiety.

## 2014-06-06 LAB — COMPLETE METABOLIC PANEL WITH GFR
ALT: 23 U/L (ref 0–35)
AST: 21 U/L (ref 0–37)
Albumin: 3.9 g/dL (ref 3.5–5.2)
Alkaline Phosphatase: 65 U/L (ref 39–117)
BILIRUBIN TOTAL: 0.4 mg/dL (ref 0.2–1.2)
BUN: 16 mg/dL (ref 6–23)
CO2: 27 mEq/L (ref 19–32)
Calcium: 9.1 mg/dL (ref 8.4–10.5)
Chloride: 107 mEq/L (ref 96–112)
Creat: 0.8 mg/dL (ref 0.50–1.10)
GFR, EST NON AFRICAN AMERICAN: 86 mL/min
GFR, Est African American: >89 mL/min
GLUCOSE: 73 mg/dL (ref 70–99)
Potassium: 4.3 mEq/L (ref 3.5–5.3)
SODIUM: 141 meq/L (ref 135–145)
TOTAL PROTEIN: 6.4 g/dL (ref 6.0–8.3)

## 2014-06-06 LAB — LIPID PANEL
Cholesterol: 134 mg/dL (ref 0–200)
HDL: 34 mg/dL — AB (ref >39)
LDL Cholesterol: 62 mg/dL (ref 0–99)
Total CHOL/HDL Ratio: 3.9 Ratio
Triglycerides: 189 mg/dL — ABNORMAL HIGH (ref <150)
VLDL: 38 mg/dL (ref 0–40)

## 2014-06-20 ENCOUNTER — Telehealth: Payer: Self-pay | Admitting: Family Medicine

## 2014-06-20 NOTE — Telephone Encounter (Signed)
Patient would like to talk with you regarding her cholesterol medication  [phone]

## 2014-06-20 NOTE — Telephone Encounter (Signed)
Pt states that she was taking Lipitor and ran out several months ago and her mother had some Simvastatin and she thinks the Simvastatin brought her cholesterol down further then the Lipitor and wanted to know what you thought and if we could call her in a RX?

## 2014-06-20 NOTE — Telephone Encounter (Signed)
Tried to call pt several times and phone would not connect on her end. Will try again later.

## 2014-06-21 ENCOUNTER — Telehealth: Payer: Self-pay | Admitting: Family Medicine

## 2014-06-21 MED ORDER — SIMVASTATIN 20 MG PO TABS: 20.0000 mg | ORAL_TABLET | Freq: Every day | ORAL | Status: DC

## 2014-06-21 NOTE — Telephone Encounter (Signed)
Ok discontinue zetia and start simvastatin 20 mg poqday and recheck in 3 months.

## 2014-06-21 NOTE — Telephone Encounter (Signed)
Pt aware and med sent to pharm 

## 2014-06-21 NOTE — Telephone Encounter (Signed)
Patient is calling to speak with you regarding her cholesterol medication

## 2014-06-25 NOTE — Telephone Encounter (Signed)
Spoke to pt in person today

## 2014-06-25 NOTE — Telephone Encounter (Signed)
Questions answered.

## 2014-07-20 ENCOUNTER — Other Ambulatory Visit: Payer: Self-pay | Admitting: Family Medicine

## 2014-07-20 NOTE — Telephone Encounter (Signed)
ok 

## 2014-07-20 NOTE — Telephone Encounter (Signed)
Ok to refill??  Last office visit 06/05/2014.  Last refill 04/17/2014, #2 refills.

## 2014-07-20 NOTE — Telephone Encounter (Signed)
Medication called to pharmacy. 

## 2014-08-15 ENCOUNTER — Other Ambulatory Visit: Payer: Self-pay | Admitting: Family Medicine

## 2014-08-16 NOTE — Telephone Encounter (Signed)
Last RF 05/07/14  #90 + 2 rf's  OK refill?

## 2014-08-28 ENCOUNTER — Ambulatory Visit (INDEPENDENT_AMBULATORY_CARE_PROVIDER_SITE_OTHER): Payer: Medicare PPO | Admitting: Family Medicine

## 2014-08-28 DIAGNOSIS — Z23 Encounter for immunization: Secondary | ICD-10-CM

## 2014-09-06 ENCOUNTER — Telehealth: Payer: Self-pay | Admitting: Family Medicine

## 2014-09-06 NOTE — Telephone Encounter (Signed)
[phone] patient would like to speak to you regarding an injury to her back and neck

## 2014-09-07 MED ORDER — TRAMADOL HCL 50 MG PO TABS: 50.0000 mg | ORAL_TABLET | Freq: Two times a day (BID) | ORAL | Status: DC

## 2014-09-07 NOTE — Telephone Encounter (Signed)
Pt called and states that she hurt her back and would like a refill on her Tramadol or maybe a low dose of hydrocodone. Will call in Tramadol for pt as it was ok'd for 60 per month in previous note and it has been >then a month since she had it filled. Pt informed that if no better by Monday NTBS. Pt verbalizes understanding.

## 2014-09-10 NOTE — Telephone Encounter (Signed)
ok 

## 2014-09-17 ENCOUNTER — Other Ambulatory Visit: Payer: Self-pay | Admitting: Family Medicine

## 2014-09-25 ENCOUNTER — Encounter: Payer: Self-pay | Admitting: Vascular Surgery

## 2014-09-26 ENCOUNTER — Encounter: Payer: Self-pay | Admitting: Vascular Surgery

## 2014-09-26 ENCOUNTER — Ambulatory Visit
Admission: RE | Admit: 2014-09-26 | Discharge: 2014-09-26 | Disposition: A | Discharge status: Home / Self Care | Payer: Medicare PPO | Source: Ambulatory Visit | Attending: Vascular Surgery | Admitting: Vascular Surgery

## 2014-09-26 ENCOUNTER — Other Ambulatory Visit: Payer: Self-pay

## 2014-09-26 ENCOUNTER — Ambulatory Visit (INDEPENDENT_AMBULATORY_CARE_PROVIDER_SITE_OTHER): Payer: Medicare PPO | Admitting: Vascular Surgery

## 2014-09-26 VITALS — BP 146/53 | HR 107 | Ht <= 58 in | Wt 134.0 lb

## 2014-09-26 DIAGNOSIS — I70219 Atherosclerosis of native arteries of extremities with intermittent claudication, unspecified extremity: Secondary | ICD-10-CM

## 2014-09-26 DIAGNOSIS — Z48812 Encounter for surgical aftercare following surgery on the circulatory system: Secondary | ICD-10-CM

## 2014-09-26 DIAGNOSIS — I739 Peripheral vascular disease, unspecified: Secondary | ICD-10-CM | POA: Insufficient documentation

## 2014-09-26 DIAGNOSIS — Z01810 Encounter for preprocedural cardiovascular examination: Secondary | ICD-10-CM

## 2014-09-26 MED ORDER — IOHEXOL 350 MG/ML SOLN
150.0000 mL | Freq: Once | INTRAVENOUS | Status: AC | PRN
  Administered 2014-09-26: 150 mL via INTRAVENOUS

## 2014-09-26 NOTE — Addendum Note (Signed)
Addended by: Sharee PimpleMCCHESNEY, MARILYN K on: 09/26/2014 02:14 PM   Modules accepted: Orders

## 2014-09-26 NOTE — Progress Notes (Signed)
Patient ID: Felicia [name], female   DOB: 11/30/1965, 48 y.o.   MRN: [id number]  Reason for Consult: Follow up of peripheral vascular disease   Subjective:     HPI:  Felicia [name] is a 48 y.o. female who I last saw in April of this year. She has known severe aortoiliac occlusive disease based on a previous arteriogram. She also has very small arteries. When I saw her last her lower extremity claudication symptoms were gradually worsening. Her ABIs drop slightly. ABI on the right was 35% an ABI on the left was 37%. We again discussed the importance of tobacco cessation and she was still smoking. We also discussed the importance of a structured walking program. I felt that she would not be an ideal candidate for aortofemoral bypass grafting given that her arteries are very small. She comes in now for a 6 month follow up visit.  She has had progressive worsening of her symptoms with disabling claudication of both lower extremities. She expresses pain in her hips thighs and calves bilaterally which is brought on by ambulation and relieved with rest. She is unable to even vacuum in her house without experiencing significant pain. She had severe pain one night after going out to keep this was about 4-5 months ago. She does deny rest pain or history of nonhealing ulcers.  She had quit smoking in July but after the death of her mother began smoking again although she still trying to quit. She does try to walk every day but can walk only a very short distance before its branching disabling symptoms.  She denies any previous history of myocardial infarction or history of congestive heart failure. She's had no chest pain or chest pressure. Her activity is limited by her peripheral vascular disease.  Past Medical History  Diagnosis Date  . Asthma   . PUD (peptic ulcer disease)   . Anxiety   . Bipolar 1 disorder   . Insomnia   . Hyperlipidemia   . Anemia   . Atrial fibrillation    Family History    Problem Relation Age of Onset  . Heart disease Mother   . Diabetes Mother   . Hyperlipidemia Mother   . Hypertension Mother   . Heart attack Mother   . Heart disease Father   . Diabetes Father   . Heart attack Father   . Diabetes Brother    Past Surgical History  Procedure Laterality Date  . Shoulder surgery    . Nose surgery    . Cesarean section    . Nose surgery    . Back surgery      Short Social History:  History  Substance Use Topics  . Smoking status: Current Every Day Smoker -- 0.50 packs/day for 30 years    Types: Cigarettes    Last Attempt to Quit: 07/13/2012  . Smokeless tobacco: Never Used     Comment: pt states that she is dealing with some stressful events in her life and as a result has began smoking about 3-4 cigs per day  . Alcohol Use: No    Allergies  Allergen Reactions  . Cephalosporins Hives  . Codeine Nausea Only  . Doxycycline Hives  . Tetracyclines & Related Hives  . Reflex Pain Relief [Menthol] Rash    Current Outpatient Prescriptions  Medication Sig Dispense Refill  . albuterol (PROVENTIL HFA;VENTOLIN HFA) 108 (90 BASE) MCG/ACT inhaler Inhale 2 puffs into the lungs every 6 (six) hours as needed. For  shortness of breath    . albuterol (PROVENTIL) (2.5 MG/3ML) 0.083% nebulizer solution Take 2.5 mg by nebulization every 6 (six) hours as needed. For shortness of breath    . aspirin 81 MG tablet Take 81 mg by mouth daily.    . chloroquine (ARALEN) 250 MG tablet Take 1 tablet (250 mg total) by mouth daily. 30 tablet 1  . clonazePAM (KLONOPIN) 1 MG tablet TAKE 1 TABLET BY MOUTH 3 TIMES A DAY AS NEEDED 90 tablet 2  . cyclobenzaprine (FLEXERIL) 10 MG tablet TAKE 1 TABLET BY MOUTH 3 TIMES A DAY AS NEEDED FOR MUSCLE SPASMS 90 tablet 2  . diphenhydrAMINE (BENADRYL) 50 MG capsule Take 50 mg by mouth every 6 (six) hours as needed for itching.    . divalproex (DEPAKOTE ER) 500 MG 24 hr tablet TAKE 2 TABLETS BY MOUTH EVERY DAY AT BEDTIME 180 tablet 1  .  EPINEPHrine (EPI-PEN) 0.3 mg/0.3 mL SOAJ injection Inject 0.3 mLs (0.3 mg total) into the muscle once. 1 Device 2  . ibuprofen (ADVIL,MOTRIN) 200 MG tablet Take 200 mg by mouth every 6 (six) hours as needed.    . Multiple Vitamin (MULTIVITAMIN) tablet Take 1 tablet by mouth daily.    . simvastatin (ZOCOR) 20 MG tablet Take 1 tablet (20 mg total) by mouth at bedtime. 30 tablet 3  . traMADol (ULTRAM) 50 MG tablet Take 1 tablet (50 mg total) by mouth 2 (two) times daily. 60 tablet 0   No current facility-administered medications for this visit.    Review of Systems  Constitutional: Negative for chills and fever.  Eyes: Negative for loss of vision.  Respiratory: Positive for wheezing. Negative for cough.  Cardiovascular: Positive for claudication and leg swelling. Negative for chest pain, chest tightness, dyspnea with exertion, orthopnea and palpitations.  GI: Negative for blood in stool and vomiting.  GU: Negative for dysuria and hematuria.  Musculoskeletal: Negative for leg pain, joint pain and myalgias.  Skin: Negative for rash and wound.  Neurological: Negative for dizziness and speech difficulty.  Hematologic: Negative for bruises/bleeds easily. Psychiatric: Negative for depressed mood.        Objective:  Objective  Filed Vitals:   09/26/14 1134  BP: 146/53  Pulse: 107  Height: 4\' 9"  (1.448 m)  Weight: 134 lb (60.782 kg)  SpO2: 97%   Body mass index is 28.99 kg/(m^2).  Physical Exam  Constitutional: She is oriented to person, place, and time. She appears well-developed and well-nourished.  HENT:  Head: Normocephalic and atraumatic.  Neck: Neck supple. No JVD present. No thyromegaly present.  Cardiovascular: Normal rate, regular rhythm and normal heart sounds.  Exam reveals no friction rub.   No murmur heard. Pulmonary/Chest: Breath sounds normal. She has no wheezes. She has no rales.  Abdominal: Soft. Bowel sounds are normal. There is no tenderness.  I do not palpate an  aneurysm.  Musculoskeletal: Normal range of motion. She exhibits no edema.  Lymphadenopathy:    She has no cervical adenopathy.  Neurological: She is alert and oriented to person, place, and time. She has normal strength. No sensory deficit.  Skin: No lesion and no rash noted.  Psychiatric: She has a normal mood and affect.    Data: CT ANGIO: I have reviewed her CT angiogram with Dr. Tommie Raymond. She has progressive aortoiliac occlusive disease with complete occlusion of the distal aorta just below the IMA origin. Both common iliac arteries are chronically occluded with reconstitution of very small external iliac arteries bilaterally.  Assessment/Plan:     Atherosclerosis of native arteries of extremity with intermittent claudication This patient has progressive ischemia of bilateral lower extremities with disabling claudication. She is unable to even perform her activities of daily living because of her symptoms. CT angiogram today shows that she has gone on to occlude her infrarenal aorta. She reconstitutes her common femoral arteries bilaterally and the bifurcations are patent. My main concern is that her femoral arteries are very small. I measured them at only about 4 mm. I had a long discussion with her today about our options. I would prefer continued conservative treatment with continued attempts at tobacco cessation and a structured walking program. However she feels that her symptoms are simply not bearable. We have discussed the option of aortobifemoral bypass grafting. I have reviewed the potential complications of surgery, including but not limited to, bleeding, wound healing problems, hernia, graft infection, graft thrombosis, or cardiac pulmonary or renal complications. I've explained that since she has a juxtarenal occlusion we would have to clamp above the renal arteries which would put her at slightly increased risk for renal insufficiency. In addition I think she is at  significantly higher risk for graft thrombosis given the small size of her femoral arteries. We'll arrange for preoperative cardiac clearance and once this is complete we can arrange for aortofemoral bypass grafting as she feels that she simply cannot tolerate her symptoms any longer.     Chuck Hinthristopher S Kabe Mckoy MD [facility]

## 2014-09-26 NOTE — Assessment & Plan Note (Signed)
This patient has progressive ischemia of bilateral lower extremities with disabling claudication. She is unable to even perform her activities of daily living because of her symptoms. CT angiogram today shows that she has gone on to occlude her infrarenal aorta. She reconstitutes her common femoral arteries bilaterally and the bifurcations are patent. My main concern is that her femoral arteries are very small. I measured them at only about 4 mm. I had a long discussion with her today about our options. I would prefer continued conservative treatment with continued attempts at tobacco cessation and a structured walking program. However she feels that her symptoms are simply not bearable. We have discussed the option of aortobifemoral bypass grafting. I have reviewed the potential complications of surgery, including but not limited to, bleeding, wound healing problems, hernia, graft infection, graft thrombosis, or cardiac pulmonary or renal complications. I've explained that since she has a juxtarenal occlusion we would have to clamp above the renal arteries which would put her at slightly increased risk for renal insufficiency. In addition I think she is at significantly higher risk for graft thrombosis given the small size of her femoral arteries. We'll arrange for preoperative cardiac clearance and once this is complete we can arrange for aortofemoral bypass grafting as she feels that she simply cannot tolerate her symptoms any longer.

## 2014-10-03 ENCOUNTER — Ambulatory Visit (INDEPENDENT_AMBULATORY_CARE_PROVIDER_SITE_OTHER): Payer: Medicare PPO | Admitting: Cardiovascular Disease

## 2014-10-03 ENCOUNTER — Encounter: Payer: Self-pay | Admitting: Cardiovascular Disease

## 2014-10-03 VITALS — BP 142/80 | HR 92 | Ht <= 58 in | Wt 134.0 lb

## 2014-10-03 DIAGNOSIS — Z01818 Encounter for other preprocedural examination: Secondary | ICD-10-CM

## 2014-10-03 DIAGNOSIS — I739 Peripheral vascular disease, unspecified: Secondary | ICD-10-CM

## 2014-10-03 NOTE — Patient Instructions (Signed)
Lexiscan Myoview- this is a test that looks at the blood flow to your heart muscle.  It takes approximately 2 1/2 hours. Please follow instruction sheet, as given.   Follow up with Dr Allyson SabalBerry as needed.

## 2014-10-03 NOTE — Assessment & Plan Note (Signed)
The patient was referred to me by Dr. Cari Carawayhris Dickson for cardiovascular clearance before elective aortobifemoral bypass graft. This would also has an occluded aorta and will need surgical revascularization.her cardiac risk factors are notable for tobacco abuse and treated hyperlipidemia on simvastatin. She has never had a heart attack or stroke. She denies chest pain but does get short of breath, early because of a smoking history as well as reactive airways disease. I'm going to get a thrombotic Myoview stress test to risk stratify her prior to her upcoming procedure.

## 2014-10-03 NOTE — Assessment & Plan Note (Signed)
On simvastatin, followed by her PCP.

## 2014-10-03 NOTE — Progress Notes (Signed)
10/03/2014 Felicia [name]   07/27/1966  [id number]  Primary Physician Leo GrosserPICKARD,WARREN TOM, MD Primary Cardiologist: Runell GessJonathan J. Justine Dines MD Roseanne RenoFACP,FACC,FAHA, FSCAI   HPI:  Ms. Baker PieriniWoodall is a 48 year old married Caucasian female mother of 2 children,, 4 grandchildren who is currently disabled because of a work-related accident. She was referred by Dr. Cari Carawayhris Dickson for cardiovascular clearance prior to elective aortobifemoral bypass grafting. Her cardiovascular risk factor profile is notable for continued tobacco abuse and treated hyperlipidemia. She has never had a heart attack or stroke. She denies chest pain but does have chronic shortness of breath related to her smoking history of reactive airways disease. She has back and leg pain and underwent CT angiography by Dr. Edilia Boickson revealing an occluded aorta (Leriche's syndrome) which she will require aortobifemoral bypass grafting. She is referred here for cardiovascular clearance for that procedure.   Current Outpatient Prescriptions  Medication Sig Dispense Refill  . albuterol (PROVENTIL HFA;VENTOLIN HFA) 108 (90 BASE) MCG/ACT inhaler Inhale 2 puffs into the lungs every 6 (six) hours as needed. For shortness of breath    . albuterol (PROVENTIL) (2.5 MG/3ML) 0.083% nebulizer solution Take 2.5 mg by nebulization every 6 (six) hours as needed. For shortness of breath    . aspirin 81 MG tablet Take 81 mg by mouth daily.    . clonazePAM (KLONOPIN) 1 MG tablet TAKE 1 TABLET BY MOUTH 3 TIMES A DAY AS NEEDED 90 tablet 2  . cyclobenzaprine (FLEXERIL) 10 MG tablet TAKE 1 TABLET BY MOUTH 3 TIMES A DAY AS NEEDED FOR MUSCLE SPASMS 90 tablet 2  . diphenhydrAMINE (BENADRYL) 50 MG capsule Take 50 mg by mouth every 6 (six) hours as needed for itching.    . divalproex (DEPAKOTE ER) 500 MG 24 hr tablet TAKE 2 TABLETS BY MOUTH EVERY DAY AT BEDTIME 180 tablet 1  . EPINEPHrine (EPI-PEN) 0.3 mg/0.3 mL SOAJ injection Inject 0.3 mLs (0.3 mg total) into the muscle once.  1 Device 2  . ibuprofen (ADVIL,MOTRIN) 200 MG tablet Take 200 mg by mouth every 6 (six) hours as needed.    . Multiple Vitamin (MULTIVITAMIN) tablet Take 1 tablet by mouth daily.    . simvastatin (ZOCOR) 20 MG tablet Take 1 tablet (20 mg total) by mouth at bedtime. 30 tablet 3  . traMADol (ULTRAM) 50 MG tablet Take 1 tablet (50 mg total) by mouth 2 (two) times daily. 60 tablet 0   No current facility-administered medications for this visit.    Allergies  Allergen Reactions  . Cephalosporins Hives  . Codeine Nausea Only  . Doxycycline Hives  . Tetracyclines & Related Hives  . Reflex Pain Relief [Menthol] Rash    History   Social History  . Marital Status: Married    Spouse Name: N/A    Number of Children: N/A  . Years of Education: N/A   Occupational History  . Not on file.   Social History Main Topics  . Smoking status: Current Every Day Smoker -- 0.50 packs/day for 30 years    Types: Cigarettes    Last Attempt to Quit: 07/13/2012  . Smokeless tobacco: Never Used     Comment: pt states that she is dealing with some stressful events in her life and as a result has began smoking about 3-4 cigs per day  . Alcohol Use: No  . Drug Use: No  . Sexual Activity: Not on file   Other Topics Concern  . Not on file   Social History Narrative  Review of Systems: General: negative for chills, fever, night sweats or weight changes.  Cardiovascular: negative for chest pain, dyspnea on exertion, edema, orthopnea, palpitations, paroxysmal nocturnal dyspnea or shortness of breath Dermatological: negative for rash Respiratory: negative for cough or wheezing Urologic: negative for hematuria Abdominal: negative for nausea, vomiting, diarrhea, bright red blood per rectum, melena, or hematemesis Neurologic: negative for visual changes, syncope, or dizziness All other systems reviewed and are otherwise negative except as noted above.    Blood pressure 142/80, pulse 92, height 4\' 9"   (1.448 m), weight 134 lb (60.782 kg), last menstrual period 12/26/2012.  General appearance: alert and no distress Neck: no adenopathy, no carotid bruit, no JVD, supple, symmetrical, trachea midline and thyroid not enlarged, symmetric, no tenderness/mass/nodules Lungs: clear to auscultation bilaterally Heart: regular rate and rhythm, S1, S2 normal, no murmur, click, rub or gallop Extremities: extremities normal, atraumatic, no cyanosis or edema  EKG not performed today  ASSESSMENT AND PLAN:   PVD (peripheral vascular disease) with claudication The patient was referred to me by Dr. Cari Carawayhris Dickson for cardiovascular clearance before elective aortobifemoral bypass graft. This would also has an occluded aorta and will need surgical revascularization.her cardiac risk factors are notable for tobacco abuse and treated hyperlipidemia on simvastatin. She has never had a heart attack or stroke. She denies chest pain but does get short of breath, early because of a smoking history as well as reactive airways disease. I'm going to get a thrombotic Myoview stress test to risk stratify her prior to her upcoming procedure.  Hyperlipidemia On simvastatin, followed by her PCP.      Runell GessJonathan J. Shaterica Mcclatchy MD FACP,FACC,FAHA, [facility] 10/03/2014 3:21 PM

## 2014-10-09 ENCOUNTER — Other Ambulatory Visit (HOSPITAL_COMMUNITY): Payer: Medicare PPO

## 2014-10-09 ENCOUNTER — Telehealth: Payer: Self-pay | Admitting: Family Medicine

## 2014-10-09 NOTE — Telephone Encounter (Signed)
Patient is calling to get rx for her tramadol if possible her shoulder is still hurting  [phone]

## 2014-10-10 MED ORDER — TRAMADOL HCL 50 MG PO TABS
50.0000 mg | ORAL_TABLET | Freq: Two times a day (BID) | ORAL | Status: DC
Start: 2014-10-10 — End: 2014-11-15

## 2014-10-10 NOTE — Telephone Encounter (Signed)
Med called to pharm and tried to call pt no answer and no vm

## 2014-10-10 NOTE — Telephone Encounter (Signed)
Approved. 0 additional refills.

## 2014-10-10 NOTE — Telephone Encounter (Signed)
?   OK to Refill  - Last refill 09/07/2014

## 2014-10-11 ENCOUNTER — Encounter (HOSPITAL_COMMUNITY): Payer: Self-pay

## 2014-10-11 ENCOUNTER — Telehealth (HOSPITAL_COMMUNITY): Payer: Self-pay

## 2014-10-11 ENCOUNTER — Encounter (HOSPITAL_COMMUNITY)
Admission: RE | Admit: 2014-10-11 | Discharge: 2014-10-11 | Disposition: A | Discharge status: Home / Self Care | Payer: Medicare PPO | Source: Ambulatory Visit | Attending: Vascular Surgery | Admitting: Vascular Surgery

## 2014-10-11 DIAGNOSIS — G4700 Insomnia, unspecified: Secondary | ICD-10-CM | POA: Insufficient documentation

## 2014-10-11 DIAGNOSIS — Z01818 Encounter for other preprocedural examination: Secondary | ICD-10-CM | POA: Insufficient documentation

## 2014-10-11 DIAGNOSIS — I739 Peripheral vascular disease, unspecified: Secondary | ICD-10-CM | POA: Insufficient documentation

## 2014-10-11 DIAGNOSIS — Z720 Tobacco use: Secondary | ICD-10-CM | POA: Insufficient documentation

## 2014-10-11 DIAGNOSIS — D649 Anemia, unspecified: Secondary | ICD-10-CM | POA: Insufficient documentation

## 2014-10-11 DIAGNOSIS — R0609 Other forms of dyspnea: Secondary | ICD-10-CM | POA: Insufficient documentation

## 2014-10-11 DIAGNOSIS — J45909 Unspecified asthma, uncomplicated: Secondary | ICD-10-CM | POA: Insufficient documentation

## 2014-10-11 DIAGNOSIS — I4891 Unspecified atrial fibrillation: Secondary | ICD-10-CM | POA: Insufficient documentation

## 2014-10-11 DIAGNOSIS — F419 Anxiety disorder, unspecified: Secondary | ICD-10-CM | POA: Diagnosis not present

## 2014-10-11 DIAGNOSIS — I10 Essential (primary) hypertension: Secondary | ICD-10-CM | POA: Diagnosis not present

## 2014-10-11 DIAGNOSIS — E785 Hyperlipidemia, unspecified: Secondary | ICD-10-CM | POA: Insufficient documentation

## 2014-10-11 DIAGNOSIS — I7409 Other arterial embolism and thrombosis of abdominal aorta: Secondary | ICD-10-CM | POA: Insufficient documentation

## 2014-10-11 DIAGNOSIS — R011 Cardiac murmur, unspecified: Secondary | ICD-10-CM | POA: Insufficient documentation

## 2014-10-11 DIAGNOSIS — K279 Peptic ulcer, site unspecified, unspecified as acute or chronic, without hemorrhage or perforation: Secondary | ICD-10-CM | POA: Diagnosis not present

## 2014-10-11 DIAGNOSIS — F319 Bipolar disorder, unspecified: Secondary | ICD-10-CM | POA: Diagnosis not present

## 2014-10-11 HISTORY — DX: Cardiac murmur, unspecified: R01.1

## 2014-10-11 HISTORY — DX: Major depressive disorder, single episode, unspecified: F32.9

## 2014-10-11 HISTORY — DX: Headache: R51

## 2014-10-11 HISTORY — DX: Depression, unspecified: F32.A

## 2014-10-11 HISTORY — DX: Pneumonia, unspecified organism: J18.9

## 2014-10-11 HISTORY — DX: Headache, unspecified: R51.9

## 2014-10-11 HISTORY — DX: Reserved for inherently not codable concepts without codable children: IMO0001

## 2014-10-11 HISTORY — DX: Unspecified osteoarthritis, unspecified site: M19.90

## 2014-10-11 LAB — COMPREHENSIVE METABOLIC PANEL
ALBUMIN: 3.7 g/dL (ref 3.5–5.2)
ALK PHOS: 70 U/L (ref 39–117)
ALT: 32 U/L (ref 0–35)
AST: 27 U/L (ref 0–37)
Anion gap: 18 — ABNORMAL HIGH (ref 5–15)
BILIRUBIN TOTAL: 0.2 mg/dL — AB (ref 0.3–1.2)
BUN: 9 mg/dL (ref 6–23)
CHLORIDE: 106 meq/L (ref 96–112)
CO2: 20 mEq/L (ref 19–32)
Calcium: 9.6 mg/dL (ref 8.4–10.5)
Creatinine, Ser: 0.69 mg/dL (ref 0.50–1.10)
GFR calc Af Amer: >90 mL/min (ref >90)
GFR calc non Af Amer: >90 mL/min (ref >90)
GLUCOSE: 69 mg/dL — AB (ref 70–99)
POTASSIUM: 4 meq/L (ref 3.7–5.3)
Sodium: 144 mEq/L (ref 137–147)
Total Protein: 7.6 g/dL (ref 6.0–8.3)

## 2014-10-11 LAB — CBC
HEMATOCRIT: 42 % (ref 36.0–46.0)
HEMOGLOBIN: 14 g/dL (ref 12.0–15.0)
MCH: 30.1 pg (ref 26.0–34.0)
MCHC: 33.3 g/dL (ref 30.0–36.0)
MCV: 90.3 fL (ref 78.0–100.0)
Platelets: 290 10*3/uL (ref 150–400)
RBC: 4.65 MIL/uL (ref 3.87–5.11)
RDW: 13.1 % (ref 11.5–15.5)
WBC: 6.1 10*3/uL (ref 4.0–10.5)

## 2014-10-11 LAB — BLOOD GAS, ARTERIAL
Acid-base deficit: 0.4 mmol/L (ref 0.0–2.0)
BICARBONATE: 23.2 meq/L (ref 20.0–24.0)
Drawn by: 206361
FIO2: 0.21 %
O2 Saturation: 98 %
PH ART: 7.447 (ref 7.350–7.450)
PO2 ART: 88.6 mmHg (ref 80.0–100.0)
Patient temperature: 98.6
TCO2: 24.2 mmol/L (ref 0–100)
pCO2 arterial: 34 mmHg — ABNORMAL LOW (ref 35.0–45.0)

## 2014-10-11 LAB — URINALYSIS, ROUTINE W REFLEX MICROSCOPIC
Bilirubin Urine: NEGATIVE
Glucose, UA: NEGATIVE mg/dL
Ketones, ur: NEGATIVE mg/dL
Leukocytes, UA: NEGATIVE
NITRITE: NEGATIVE
Protein, ur: 30 mg/dL — AB
Specific Gravity, Urine: 1.013 (ref 1.005–1.030)
UROBILINOGEN UA: 0.2 mg/dL (ref 0.0–1.0)
pH: 6 (ref 5.0–8.0)

## 2014-10-11 LAB — URINE MICROSCOPIC-ADD ON

## 2014-10-11 LAB — PROTIME-INR
INR: 1.01 (ref 0.00–1.49)
Prothrombin Time: 13.4 seconds (ref 11.6–15.2)

## 2014-10-11 LAB — SURGICAL PCR SCREEN
MRSA, PCR: NEGATIVE
Staphylococcus aureus: NEGATIVE

## 2014-10-11 LAB — APTT: aPTT: 27 seconds (ref 24–37)

## 2014-10-11 LAB — ABO/RH: ABO/RH(D): A POS

## 2014-10-11 NOTE — Progress Notes (Signed)
PCP is Dr Lynnea FerrierWarren Pickard Cardiologist is Dr Allyson SabalBerry  Pt is scheduled for a stress test 10-16-14 Denies having an echo or card cath.

## 2014-10-11 NOTE — Pre-Procedure Instructions (Signed)
Felicia [name]  10/11/2014   Your procedure is scheduled on:  Dec 1st at 730  [facility].  Call this number if you have problems the morning of surgery: [phone]   Remember:   Do not eat food or drink liquids after midnight.   Take these medicines the morning of surgery with A SIP OF WATER: albuterol (Proventil) if needed, clonazepam (Klonopin) if needed, cyclobenzaprine (Flexeril) if needed, Tramadol (Ultram) if needed  Stop taking aspirin, Aleve, Ibuprofen, BC's, Goody's, Herbal medications, Fish Oil   Do not wear jewelry, make-up or nail polish.  Do not wear lotions, powders, or perfumes. You may wear deodorant.  Do not shave 48 hours prior to surgery. Men may shave face and neck.  Do not bring valuables to the hospital.  [facility] is not responsible for any belongings or valuables.               Contacts, dentures or bridgework may not be worn into surgery.  Leave suitcase in the car. After surgery it may be brought to your room.  For patients admitted to the hospital, discharge time is determined by your                treatment team.               Patients discharged the day of surgery will not be allowed to drive  home.    Special Instructions:[place] - Preparing for Surgery  Before surgery, you can play an important role.  Because skin is not sterile, your skin needs to be as free of germs as possible.  You can reduce the number of germs on you skin by washing with CHG (chlorahexidine gluconate) soap before surgery.  CHG is an antiseptic cleaner which kills germs and bonds with the skin to continue killing germs even after washing.  Please DO NOT use if you have an allergy to CHG or antibacterial soaps.  If your skin becomes reddened/irritated stop using the CHG and inform your nurse when you arrive at Short Stay.  Do not shave (including legs and underarms) for at least 48 hours prior to the first CHG shower.  You may  shave your face.  Please follow these instructions carefully:   1.  Shower with CHG Soap the night before surgery and the  morning of Surgery.  2.  If you choose to wash your hair, wash your hair first as usual with your normal shampoo.  3.  After you shampoo, rinse your hair and body thoroughly to remove the Shampoo.  4.  Use CHG as you would any other liquid soap.  You can apply chg directly to the skin and wash gently with scrungie or a clean washcloth.  5.  Apply the CHG Soap to your body ONLY FROM THE NECK DOWN. Do not use on open wounds or open sores.  Avoid contact with your eyes, ears, mouth and genitals (private parts).  Wash genitals (private parts) with your normal soap.  6.  Wash thoroughly, paying special attention to the area where your surgery  will be performed.  7.  Thoroughly rinse your body with warm water from the neck down.  8.  DO NOT shower/wash with your normal soap after using and rinsing off  the CHG Soap.  9.  Pat yourself dry with a clean towel.            10.  Wear clean pajamas.  11.  Place clean sheets on your bed the night of your first shower and do not        sleep with pets.  Day of Surgery  Do not apply any lotions/deoderants the morning of surgery.  Please wear clean clothes to the hospital/surgery center.      Please read over the following fact sheets that you were given: Pain Booklet, Coughing and Deep Breathing, Blood Transfusion Information, MRSA Information and Surgical Site Infection Prevention

## 2014-10-11 NOTE — Telephone Encounter (Signed)
Encounter complete. 

## 2014-10-12 ENCOUNTER — Encounter (HOSPITAL_COMMUNITY): Payer: Self-pay

## 2014-10-12 NOTE — Progress Notes (Addendum)
Anesthesia Chart Review:  Patient is a 48 year old female scheduled for AFBG on 10/23/14 by Dr. Edilia Boickson.  History includes smoking, afib (remote?), murmur (not specified), asthma, exertional dyspnea, PUD, anxiety, Bipolar disorder, HLD, HTN, PAD Leriche's syndrome-occluded aorta), anemia, insomnia, back surgery, nasal surgery, tonsillectomy. PCP is Dr. Lynnea FerrierWarren Pickard.  Cardiologist is Dr. Nanetta BattyJonathan Berry (referred for preoperative evaluation). According to his 10/03/14 note, "no murmur" on exam. His note also does not mention a history of afib.  She has 4 EKGs in [place] dating back to 2005 and none of them show afib.   Meds: Epi pen, MVI, albuterol, ASA 81mg , clonazepam, cyclobenzaprine, diphenhydramine, Depakote ER, ibuprofen, simvastatin, tramadol.  EKG on 10/11/14: NSR.  She is scheduled for a nuclear stress test on 10/16/14 as part of her preoperative clearance.  Carotid duplex 06/19/11: Mild calcific plaque bilaterally without hemodynamically significant stenosis. Mild intimal thickening.  Preoperative labs noted.   Plans to proceed will depend on upcoming stress test results.  Chart will be left for follow-up.  Velna Ochsllison Zelenak, PA-C [facility]/Anesthesiology Phone [phone] 10/12/2014 10:24 AM  Addendum:  Nuclear stress test results from 10/16/14 showed: Normal stress nuclear study.  No evidence of ischemia.  Normal LV function, EF 69%, normal wall motion.  Velna Ochsllison Zelenak, PA-C [facility]/Anesthesiology Phone [phone] 10/17/2014 9:43 AM

## 2014-10-16 ENCOUNTER — Ambulatory Visit (HOSPITAL_COMMUNITY)
Admission: RE | Admit: 2014-10-16 | Discharge: 2014-10-16 | Disposition: A | Discharge status: Home / Self Care | Payer: Medicare PPO | Source: Ambulatory Visit | Attending: Cardiovascular Disease | Admitting: Cardiovascular Disease

## 2014-10-16 DIAGNOSIS — E663 Overweight: Secondary | ICD-10-CM | POA: Diagnosis not present

## 2014-10-16 DIAGNOSIS — Z01818 Encounter for other preprocedural examination: Secondary | ICD-10-CM | POA: Insufficient documentation

## 2014-10-16 DIAGNOSIS — F17210 Nicotine dependence, cigarettes, uncomplicated: Secondary | ICD-10-CM | POA: Insufficient documentation

## 2014-10-16 DIAGNOSIS — Z6828 Body mass index (BMI) 28.0-28.9, adult: Secondary | ICD-10-CM | POA: Diagnosis not present

## 2014-10-16 DIAGNOSIS — Z01810 Encounter for preprocedural cardiovascular examination: Secondary | ICD-10-CM

## 2014-10-16 DIAGNOSIS — I739 Peripheral vascular disease, unspecified: Secondary | ICD-10-CM | POA: Insufficient documentation

## 2014-10-16 DIAGNOSIS — Z8249 Family history of ischemic heart disease and other diseases of the circulatory system: Secondary | ICD-10-CM | POA: Insufficient documentation

## 2014-10-16 MED ORDER — AMINOPHYLLINE 25 MG/ML IV SOLN
75.0000 mg | Freq: Once | INTRAVENOUS | Status: AC
  Administered 2014-10-16: 75 mg via INTRAVENOUS

## 2014-10-16 MED ORDER — TECHNETIUM TC 99M SESTAMIBI GENERIC - CARDIOLITE
30.4000 | Freq: Once | INTRAVENOUS | Status: AC | PRN
  Administered 2014-10-16: 30 via INTRAVENOUS

## 2014-10-16 MED ORDER — REGADENOSON 0.4 MG/5ML IV SOLN
0.4000 mg | Freq: Once | INTRAVENOUS | Status: AC
  Administered 2014-10-16: 0.4 mg via INTRAVENOUS

## 2014-10-16 MED ORDER — TECHNETIUM TC 99M SESTAMIBI GENERIC - CARDIOLITE
10.8000 | Freq: Once | INTRAVENOUS | Status: AC | PRN
  Administered 2014-10-16: 11 via INTRAVENOUS

## 2014-10-16 NOTE — Procedures (Addendum)
[name] [address] [place] [address] [phone]  Cardiology Nuclear Med Study  Felicia DixonCarole D [name] is a 48 y.o. female     MRN : [id number]     DOB: 01/04/1966  Procedure Date: 10/16/2014  Nuclear Med Background Indication for Stress Test:  Surgical Clearance History:  Asthma and No cardiac history reported;No prior NUC MPI for comparison. Cardiac Risk Factors: Family History - CAD, Lipids, Overweight, PVD and Smoker  Symptoms:  DOE, Fatigue and Palpitations   Nuclear Pre-Procedure Caffeine/Decaff Intake:  1:00am NPO After: 11am   IV Site: R Forearm  IV 0.9% NS with Angio Cath:  22g  Chest Size (in):  n/a IV Started by: Felicia OgrenAmanda Wease, RN  Height: 4\' 9"  (1.448 m)  Cup Size: DD  BMI:  Body mass index is 28.99 kg/(m^2). Weight:  134 lb (60.782 kg)   Tech Comments:  n/a    Nuclear Med Study 1 or 2 day study: 1 day  Stress Test Type:  Lexiscan  Order Authorizing Provider:  Nanetta BattyJonathan Berry, MD   Resting Radionuclide: Technetium 5375m Sestamibi  Resting Radionuclide Dose: 10.8 mCi   Stress Radionuclide:  Technetium 4475m Sestamibi  Stress Radionuclide Dose: 30.4 mCi           Stress Protocol Rest HR: 88 Stress HR: 112  Rest BP: 169/71 Stress BP: 169/71  Exercise Time (min): n/a METS: n/a          Dose of Adenosine (mg):  n/a Dose of Lexiscan: 0.4 mg  Dose of Atropine (mg): n/a Dose of Dobutamine: n/a mcg/kg/min (at max HR)  Stress Test Technologist: Felicia MentionGwen [name], CCT Nuclear Technologist: Felicia LexPam [name], CNMT   Rest Procedure:  Myocardial perfusion imaging was performed at rest 45 minutes following the intravenous administration of Technetium 3675m Sestamibi. Stress Procedure:  The patient received IV Lexiscan 0.4 mg over 15-seconds.  Technetium 3975m Sestamibi injected at 30-seconds.  Patient experienced shortness of breath, head pain and was administered 75 mg of Aminophylline IV. There were no significant changes  with Lexiscan.  Quantitative spect images were obtained after a 45 minute delay.  Transient Ischemic Dilatation (Normal <1.22):  1.03  QGS EDV:  61 ml QGS ESV:  19 ml LV Ejection Fraction: 69%      Rest ECG: NSR - Normal EKG  Stress ECG: No significant change from baseline ECG  QPS Raw Data Images:  Mild breast attenuation.  Normal left ventricular size. Stress Images:  Normal homogeneous uptake in all areas of the myocardium. Rest Images:  Normal homogeneous uptake in all areas of the myocardium. Subtraction (SDS):  No evidence of ischemia. LV Wall Motion:  NL LV Function; NL Wall Motion  Impression Exercise Capacity:  Lexiscan with no exercise. BP Response:  Normal blood pressure response. Clinical Symptoms:  No significant symptoms noted. ECG Impression:  No significant ST segment change suggestive of ischemia. Comparison with Prior Nuclear Study: No previous nuclear study performed   Overall Impression:  Normal stress nuclear study.   Felicia FairROITORU,Felicia Siefker, MD  10/16/2014 5:33 PM

## 2014-10-17 ENCOUNTER — Other Ambulatory Visit: Payer: Self-pay | Admitting: Family Medicine

## 2014-10-17 NOTE — Telephone Encounter (Signed)
Last Rf 8/28 #90 +2.  Last OV 06/05/14  OK refill?

## 2014-10-22 ENCOUNTER — Telehealth: Payer: Self-pay | Admitting: Family Medicine

## 2014-10-22 MED ORDER — VANCOMYCIN HCL IN DEXTROSE 1-5 GM/200ML-% IV SOLN
1000.0000 mg | INTRAVENOUS | Status: AC
  Administered 2014-10-23: 1000 mg via INTRAVENOUS
  Filled 2014-10-22: qty 200

## 2014-10-22 MED ORDER — SODIUM CHLORIDE 0.9 % IV SOLN: INTRAVENOUS | Status: DC

## 2014-10-22 NOTE — Telephone Encounter (Signed)
ok 

## 2014-10-23 ENCOUNTER — Encounter (HOSPITAL_COMMUNITY)
Admission: RE | Disposition: A | Discharge status: Home / Self Care | Payer: Medicare PPO | Source: Ambulatory Visit | Attending: Vascular Surgery

## 2014-10-23 ENCOUNTER — Inpatient Hospital Stay (HOSPITAL_COMMUNITY): Payer: Medicare PPO | Admitting: Vascular Surgery

## 2014-10-23 ENCOUNTER — Inpatient Hospital Stay (HOSPITAL_COMMUNITY): Payer: Medicare PPO

## 2014-10-23 ENCOUNTER — Inpatient Hospital Stay (HOSPITAL_COMMUNITY): Payer: Medicare PPO | Admitting: Anesthesiology

## 2014-10-23 ENCOUNTER — Inpatient Hospital Stay (HOSPITAL_COMMUNITY)
Admission: RE | Admit: 2014-10-23 | Discharge: 2014-10-27 | DRG: 271 | Disposition: A | Discharge status: Home / Self Care | Payer: Medicare PPO | Source: Ambulatory Visit | Attending: Vascular Surgery | Admitting: Vascular Surgery

## 2014-10-23 DIAGNOSIS — E785 Hyperlipidemia, unspecified: Secondary | ICD-10-CM | POA: Diagnosis present

## 2014-10-23 DIAGNOSIS — L03311 Cellulitis of abdominal wall: Secondary | ICD-10-CM | POA: Diagnosis not present

## 2014-10-23 DIAGNOSIS — Z881 Allergy status to other antibiotic agents status: Secondary | ICD-10-CM | POA: Diagnosis not present

## 2014-10-23 DIAGNOSIS — Z888 Allergy status to other drugs, medicaments and biological substances status: Secondary | ICD-10-CM

## 2014-10-23 DIAGNOSIS — Z833 Family history of diabetes mellitus: Secondary | ICD-10-CM | POA: Diagnosis not present

## 2014-10-23 DIAGNOSIS — Z791 Long term (current) use of non-steroidal anti-inflammatories (NSAID): Secondary | ICD-10-CM

## 2014-10-23 DIAGNOSIS — Z79899 Other long term (current) drug therapy: Secondary | ICD-10-CM | POA: Diagnosis not present

## 2014-10-23 DIAGNOSIS — R000 Tachycardia, unspecified: Secondary | ICD-10-CM | POA: Diagnosis not present

## 2014-10-23 DIAGNOSIS — Z7982 Long term (current) use of aspirin: Secondary | ICD-10-CM

## 2014-10-23 DIAGNOSIS — F17210 Nicotine dependence, cigarettes, uncomplicated: Secondary | ICD-10-CM | POA: Diagnosis present

## 2014-10-23 DIAGNOSIS — J45909 Unspecified asthma, uncomplicated: Secondary | ICD-10-CM | POA: Diagnosis present

## 2014-10-23 DIAGNOSIS — F319 Bipolar disorder, unspecified: Secondary | ICD-10-CM | POA: Diagnosis present

## 2014-10-23 DIAGNOSIS — I70213 Atherosclerosis of native arteries of extremities with intermittent claudication, bilateral legs: Secondary | ICD-10-CM | POA: Diagnosis present

## 2014-10-23 DIAGNOSIS — I4891 Unspecified atrial fibrillation: Secondary | ICD-10-CM | POA: Diagnosis present

## 2014-10-23 DIAGNOSIS — F419 Anxiety disorder, unspecified: Secondary | ICD-10-CM | POA: Diagnosis present

## 2014-10-23 DIAGNOSIS — Z8249 Family history of ischemic heart disease and other diseases of the circulatory system: Secondary | ICD-10-CM | POA: Diagnosis not present

## 2014-10-23 DIAGNOSIS — Z95828 Presence of other vascular implants and grafts: Secondary | ICD-10-CM

## 2014-10-23 HISTORY — PX: AORTA - BILATERAL FEMORAL ARTERY BYPASS GRAFT: SHX1175

## 2014-10-23 LAB — BASIC METABOLIC PANEL
ANION GAP: 14 (ref 5–15)
BUN: 9 mg/dL (ref 6–23)
CALCIUM: 8 mg/dL — AB (ref 8.4–10.5)
CO2: 22 meq/L (ref 19–32)
CREATININE: 0.71 mg/dL (ref 0.50–1.10)
Chloride: 104 mEq/L (ref 96–112)
GFR calc non Af Amer: >90 mL/min (ref >90)
Glucose, Bld: 162 mg/dL — ABNORMAL HIGH (ref 70–99)
Potassium: 4 mEq/L (ref 3.7–5.3)
SODIUM: 140 meq/L (ref 137–147)

## 2014-10-23 LAB — PROTIME-INR
INR: 1.19 (ref 0.00–1.49)
PROTHROMBIN TIME: 15.2 s (ref 11.6–15.2)

## 2014-10-23 LAB — BLOOD GAS, ARTERIAL
ACID-BASE DEFICIT: 3.1 mmol/L — AB (ref 0.0–2.0)
Bicarbonate: 21.7 mEq/L (ref 20.0–24.0)
O2 CONTENT: 3 L/min
O2 Saturation: 97.7 %
PO2 ART: 98.9 mmHg (ref 80.0–100.0)
Patient temperature: 98.6
TCO2: 22.9 mmol/L (ref 0–100)
pCO2 arterial: 41.2 mmHg (ref 35.0–45.0)
pH, Arterial: 7.341 — ABNORMAL LOW (ref 7.350–7.450)

## 2014-10-23 LAB — CBC
HCT: 29.9 % — ABNORMAL LOW (ref 36.0–46.0)
Hemoglobin: 9.8 g/dL — ABNORMAL LOW (ref 12.0–15.0)
MCH: 30.2 pg (ref 26.0–34.0)
MCHC: 32.8 g/dL (ref 30.0–36.0)
MCV: 92 fL (ref 78.0–100.0)
PLATELETS: 194 10*3/uL (ref 150–400)
RBC: 3.25 MIL/uL — AB (ref 3.87–5.11)
RDW: 13 % (ref 11.5–15.5)
WBC: 8.5 10*3/uL (ref 4.0–10.5)

## 2014-10-23 LAB — PREPARE RBC (CROSSMATCH)

## 2014-10-23 LAB — APTT: aPTT: 26 seconds (ref 24–37)

## 2014-10-23 LAB — MAGNESIUM: Magnesium: 1.6 mg/dL (ref 1.5–2.5)

## 2014-10-23 SURGERY — CREATION, BYPASS, ARTERIAL, AORTA TO FEMORAL, BILATERAL, USING GRAFT
Anesthesia: General

## 2014-10-23 MED ORDER — PHENOL 1.4 % MT LIQD: 1.0000 | OROMUCOSAL | Status: DC | PRN

## 2014-10-23 MED ORDER — EPHEDRINE SULFATE 50 MG/ML IJ SOLN
INTRAMUSCULAR | Status: AC
  Filled 2014-10-23: qty 1

## 2014-10-23 MED ORDER — CLONAZEPAM 1 MG PO TABS
1.0000 mg | ORAL_TABLET | Freq: Three times a day (TID) | ORAL | Status: DC | PRN
  Administered 2014-10-24 – 2014-10-27 (×10): 1 mg via ORAL
  Filled 2014-10-23 (×10): qty 1

## 2014-10-23 MED ORDER — ARTIFICIAL TEARS OP OINT
TOPICAL_OINTMENT | OPHTHALMIC | Status: AC
  Filled 2014-10-23: qty 3.5

## 2014-10-23 MED ORDER — DOCUSATE SODIUM 100 MG PO CAPS
100.0000 mg | ORAL_CAPSULE | Freq: Every day | ORAL | Status: DC
  Administered 2014-10-27: 100 mg via ORAL
  Filled 2014-10-23 (×2): qty 1

## 2014-10-23 MED ORDER — PANTOPRAZOLE SODIUM 40 MG PO TBEC
40.0000 mg | DELAYED_RELEASE_TABLET | Freq: Every day | ORAL | Status: DC
  Administered 2014-10-25 – 2014-10-27 (×3): 40 mg via ORAL
  Filled 2014-10-23 (×2): qty 1

## 2014-10-23 MED ORDER — ONDANSETRON HCL 4 MG/2ML IJ SOLN
INTRAMUSCULAR | Status: DC | PRN
  Administered 2014-10-23: 4 mg via INTRAVENOUS

## 2014-10-23 MED ORDER — LACTATED RINGERS IV SOLN
INTRAVENOUS | Status: DC | PRN
  Administered 2014-10-23: 07:00:00 via INTRAVENOUS

## 2014-10-23 MED ORDER — SODIUM CHLORIDE 0.9 % IV SOLN: 500.0000 mL | Freq: Once | INTRAVENOUS | Status: AC | PRN

## 2014-10-23 MED ORDER — FENTANYL CITRATE 0.05 MG/ML IJ SOLN
INTRAMUSCULAR | Status: AC
  Filled 2014-10-23: qty 5

## 2014-10-23 MED ORDER — ALBUMIN HUMAN 5 % IV SOLN
INTRAVENOUS | Status: DC | PRN
  Administered 2014-10-23 (×3): via INTRAVENOUS

## 2014-10-23 MED ORDER — EPINEPHRINE 0.3 MG/0.3ML IJ SOAJ
0.3000 mg | Freq: Once | INTRAMUSCULAR | Status: DC
Start: 2014-10-23 — End: 2014-10-23

## 2014-10-23 MED ORDER — HYDROMORPHONE HCL 1 MG/ML IJ SOLN
0.2500 mg | INTRAMUSCULAR | Status: DC | PRN
  Administered 2014-10-23 (×4): 0.5 mg via INTRAVENOUS

## 2014-10-23 MED ORDER — CHLORHEXIDINE GLUCONATE CLOTH 2 % EX PADS: 6.0000 | MEDICATED_PAD | Freq: Once | CUTANEOUS | Status: DC

## 2014-10-23 MED ORDER — THROMBIN 20000 UNITS EX SOLR
CUTANEOUS | Status: AC
  Filled 2014-10-23: qty 20000

## 2014-10-23 MED ORDER — MORPHINE SULFATE 2 MG/ML IJ SOLN
2.0000 mg | INTRAMUSCULAR | Status: DC | PRN
  Administered 2014-10-23 (×5): 4 mg via INTRAVENOUS
  Administered 2014-10-23: 2 mg via INTRAVENOUS
  Administered 2014-10-24 (×2): 4 mg via INTRAVENOUS
  Administered 2014-10-24: 2 mg via INTRAVENOUS
  Administered 2014-10-24: 4 mg via INTRAVENOUS
  Administered 2014-10-24: 2 mg via INTRAVENOUS
  Administered 2014-10-24 (×3): 4 mg via INTRAVENOUS
  Filled 2014-10-23 (×10): qty 2
  Filled 2014-10-23: qty 1
  Filled 2014-10-23 (×3): qty 2

## 2014-10-23 MED ORDER — HYDRALAZINE HCL 20 MG/ML IJ SOLN: 5.0000 mg | INTRAMUSCULAR | Status: DC | PRN

## 2014-10-23 MED ORDER — LACTATED RINGERS IV SOLN
INTRAVENOUS | Status: DC | PRN
  Administered 2014-10-23 (×3): via INTRAVENOUS

## 2014-10-23 MED ORDER — PROPOFOL 10 MG/ML IV BOLUS
INTRAVENOUS | Status: DC | PRN
  Administered 2014-10-23: 110 mg via INTRAVENOUS

## 2014-10-23 MED ORDER — PHENYLEPHRINE HCL 10 MG/ML IJ SOLN
10.0000 mg | INTRAMUSCULAR | Status: DC | PRN
  Administered 2014-10-23: 40 ug/min via INTRAVENOUS

## 2014-10-23 MED ORDER — SODIUM CHLORIDE 0.9 % IR SOLN
Status: DC | PRN
  Administered 2014-10-23: 09:00:00

## 2014-10-23 MED ORDER — SODIUM CHLORIDE 0.9 % IJ SOLN
INTRAMUSCULAR | Status: AC
  Filled 2014-10-23: qty 10

## 2014-10-23 MED ORDER — ONDANSETRON HCL 4 MG/2ML IJ SOLN
INTRAMUSCULAR | Status: AC
  Filled 2014-10-23: qty 2

## 2014-10-23 MED ORDER — TRAMADOL HCL 50 MG PO TABS: 50.0000 mg | ORAL_TABLET | Freq: Two times a day (BID) | ORAL | Status: DC

## 2014-10-23 MED ORDER — DIPHENHYDRAMINE HCL 25 MG PO CAPS
50.0000 mg | ORAL_CAPSULE | Freq: Four times a day (QID) | ORAL | Status: DC | PRN
  Filled 2014-10-23: qty 1

## 2014-10-23 MED ORDER — ASPIRIN 81 MG PO CHEW
81.0000 mg | CHEWABLE_TABLET | Freq: Every day | ORAL | Status: DC
  Administered 2014-10-25 – 2014-10-27 (×3): 81 mg via ORAL
  Filled 2014-10-23 (×2): qty 1

## 2014-10-23 MED ORDER — MAGNESIUM SULFATE 2 GM/50ML IV SOLN
2.0000 g | Freq: Every day | INTRAVENOUS | Status: AC | PRN
  Administered 2014-10-24: 2 g via INTRAVENOUS
  Filled 2014-10-23: qty 50

## 2014-10-23 MED ORDER — LIDOCAINE HCL (CARDIAC) 20 MG/ML IV SOLN
INTRAVENOUS | Status: AC
  Filled 2014-10-23: qty 5

## 2014-10-23 MED ORDER — METOPROLOL TARTRATE 1 MG/ML IV SOLN
2.0000 mg | INTRAVENOUS | Status: DC | PRN
Start: 2014-10-23 — End: 2014-10-27

## 2014-10-23 MED ORDER — ROCURONIUM BROMIDE 100 MG/10ML IV SOLN
INTRAVENOUS | Status: DC | PRN
  Administered 2014-10-23: 50 mg via INTRAVENOUS
  Administered 2014-10-23: 10 mg via INTRAVENOUS
  Administered 2014-10-23: 20 mg via INTRAVENOUS

## 2014-10-23 MED ORDER — DEXTROSE-NACL 5-0.45 % IV SOLN
INTRAVENOUS | Status: DC
  Administered 2014-10-24: 04:00:00 via INTRAVENOUS

## 2014-10-23 MED ORDER — DIVALPROEX SODIUM ER 500 MG PO TB24
1000.0000 mg | ORAL_TABLET | Freq: Every day | ORAL | Status: DC
  Administered 2014-10-25 – 2014-10-27 (×3): 1000 mg via ORAL
  Filled 2014-10-23 (×5): qty 2

## 2014-10-23 MED ORDER — OXYCODONE HCL 5 MG/5ML PO SOLN: 5.0000 mg | Freq: Once | ORAL | Status: DC | PRN

## 2014-10-23 MED ORDER — ONDANSETRON HCL 4 MG/2ML IJ SOLN: 4.0000 mg | Freq: Four times a day (QID) | INTRAMUSCULAR | Status: DC | PRN

## 2014-10-23 MED ORDER — PAPAVERINE HCL 30 MG/ML IJ SOLN
INTRAMUSCULAR | Status: AC
  Filled 2014-10-23: qty 4

## 2014-10-23 MED ORDER — DOPAMINE-DEXTROSE 3.2-5 MG/ML-% IV SOLN
3.0000 ug/kg/min | INTRAVENOUS | Status: DC | PRN
  Filled 2014-10-23: qty 250

## 2014-10-23 MED ORDER — PAPAVERINE HCL 30 MG/ML IJ SOLN
INTRAMUSCULAR | Status: DC | PRN
  Administered 2014-10-23: 60 mg via INTRAVENOUS

## 2014-10-23 MED ORDER — MANNITOL 25 % IV SOLN
INTRAVENOUS | Status: DC | PRN
  Administered 2014-10-23: 25 g via INTRAVENOUS

## 2014-10-23 MED ORDER — 0.9 % SODIUM CHLORIDE (POUR BTL) OPTIME
TOPICAL | Status: DC | PRN
  Administered 2014-10-23: 2000 mL

## 2014-10-23 MED ORDER — GLYCOPYRROLATE 0.2 MG/ML IJ SOLN
INTRAMUSCULAR | Status: DC | PRN
  Administered 2014-10-23: 0.4 mg via INTRAVENOUS

## 2014-10-23 MED ORDER — MIDAZOLAM HCL 2 MG/2ML IJ SOLN
INTRAMUSCULAR | Status: AC
  Filled 2014-10-23: qty 2

## 2014-10-23 MED ORDER — LABETALOL HCL 5 MG/ML IV SOLN
10.0000 mg | INTRAVENOUS | Status: DC | PRN
  Filled 2014-10-23: qty 4

## 2014-10-23 MED ORDER — ONDANSETRON HCL 4 MG/2ML IJ SOLN
4.0000 mg | Freq: Four times a day (QID) | INTRAMUSCULAR | Status: DC | PRN
Start: 2014-10-23 — End: 2014-10-27

## 2014-10-23 MED ORDER — ASPIRIN 81 MG PO TABS: 81.0000 mg | ORAL_TABLET | Freq: Every day | ORAL | Status: DC

## 2014-10-23 MED ORDER — SUCCINYLCHOLINE CHLORIDE 20 MG/ML IJ SOLN
INTRAMUSCULAR | Status: AC
  Filled 2014-10-23: qty 1

## 2014-10-23 MED ORDER — BISACODYL 10 MG RE SUPP: 10.0000 mg | Freq: Every day | RECTAL | Status: DC | PRN

## 2014-10-23 MED ORDER — HEMOSTATIC AGENTS (NO CHARGE) OPTIME
TOPICAL | Status: DC | PRN
  Administered 2014-10-23: 1 via TOPICAL

## 2014-10-23 MED ORDER — HYDROMORPHONE HCL 1 MG/ML IJ SOLN
INTRAMUSCULAR | Status: AC
  Filled 2014-10-23: qty 1

## 2014-10-23 MED ORDER — ALBUTEROL SULFATE (2.5 MG/3ML) 0.083% IN NEBU: 3.0000 mL | INHALATION_SOLUTION | Freq: Four times a day (QID) | RESPIRATORY_TRACT | Status: DC | PRN

## 2014-10-23 MED ORDER — ENOXAPARIN SODIUM 40 MG/0.4ML ~~LOC~~ SOLN
40.0000 mg | SUBCUTANEOUS | Status: DC
  Administered 2014-10-24 – 2014-10-26 (×3): 40 mg via SUBCUTANEOUS
  Filled 2014-10-23 (×5): qty 0.4

## 2014-10-23 MED ORDER — PROPOFOL 10 MG/ML IV BOLUS
INTRAVENOUS | Status: AC
  Filled 2014-10-23: qty 20

## 2014-10-23 MED ORDER — HYDROMORPHONE HCL 1 MG/ML IJ SOLN
INTRAMUSCULAR | Status: AC
  Administered 2014-10-23: 0.5 mg via INTRAVENOUS
  Filled 2014-10-23: qty 1

## 2014-10-23 MED ORDER — NEOSTIGMINE METHYLSULFATE 10 MG/10ML IV SOLN
INTRAVENOUS | Status: DC | PRN
  Administered 2014-10-23: 3 mg via INTRAVENOUS

## 2014-10-23 MED ORDER — ALUM & MAG HYDROXIDE-SIMETH 200-200-20 MG/5ML PO SUSP: 15.0000 mL | ORAL | Status: DC | PRN

## 2014-10-23 MED ORDER — THROMBIN 20000 UNITS EX SOLR
CUTANEOUS | Status: AC
  Filled 2014-10-23: qty 40000

## 2014-10-23 MED ORDER — OXYCODONE HCL 5 MG PO TABS: 5.0000 mg | ORAL_TABLET | Freq: Once | ORAL | Status: DC | PRN

## 2014-10-23 MED ORDER — PROTAMINE SULFATE 10 MG/ML IV SOLN
INTRAVENOUS | Status: DC | PRN
  Administered 2014-10-23 (×4): 10 mg via INTRAVENOUS

## 2014-10-23 MED ORDER — VANCOMYCIN HCL IN DEXTROSE 1-5 GM/200ML-% IV SOLN
1000.0000 mg | Freq: Two times a day (BID) | INTRAVENOUS | Status: AC
  Administered 2014-10-23 – 2014-10-24 (×2): 1000 mg via INTRAVENOUS
  Filled 2014-10-23 (×2): qty 200

## 2014-10-23 MED ORDER — GUAIFENESIN-DM 100-10 MG/5ML PO SYRP: 15.0000 mL | ORAL_SOLUTION | ORAL | Status: DC | PRN

## 2014-10-23 MED ORDER — PHENYLEPHRINE 40 MCG/ML (10ML) SYRINGE FOR IV PUSH (FOR BLOOD PRESSURE SUPPORT)
PREFILLED_SYRINGE | INTRAVENOUS | Status: AC
  Filled 2014-10-23: qty 10

## 2014-10-23 MED ORDER — HEPARIN SODIUM (PORCINE) 1000 UNIT/ML IJ SOLN
INTRAMUSCULAR | Status: DC | PRN
  Administered 2014-10-23: 6 mL via INTRAVENOUS

## 2014-10-23 MED ORDER — ROCURONIUM BROMIDE 50 MG/5ML IV SOLN
INTRAVENOUS | Status: AC
  Filled 2014-10-23: qty 1

## 2014-10-23 MED ORDER — ACETAMINOPHEN 325 MG PO TABS: 325.0000 mg | ORAL_TABLET | ORAL | Status: DC | PRN

## 2014-10-23 MED ORDER — FENTANYL CITRATE 0.05 MG/ML IJ SOLN
INTRAMUSCULAR | Status: DC | PRN
  Administered 2014-10-23 (×3): 50 ug via INTRAVENOUS
  Administered 2014-10-23: 100 ug via INTRAVENOUS
  Administered 2014-10-23 (×3): 50 ug via INTRAVENOUS
  Administered 2014-10-23 (×4): 25 ug via INTRAVENOUS
  Administered 2014-10-23: 100 ug via INTRAVENOUS
  Administered 2014-10-23: 25 ug via INTRAVENOUS
  Administered 2014-10-23 (×2): 50 ug via INTRAVENOUS
  Administered 2014-10-23: 25 ug via INTRAVENOUS
  Administered 2014-10-23: 50 ug via INTRAVENOUS
  Administered 2014-10-23: 100 ug via INTRAVENOUS

## 2014-10-23 MED ORDER — ACETAMINOPHEN 325 MG RE SUPP
325.0000 mg | RECTAL | Status: DC | PRN
  Filled 2014-10-23: qty 2

## 2014-10-23 MED ORDER — LABETALOL HCL 5 MG/ML IV SOLN
INTRAVENOUS | Status: DC | PRN
  Administered 2014-10-23: 2.5 mg via INTRAVENOUS
  Administered 2014-10-23: 5 mg via INTRAVENOUS

## 2014-10-23 MED ORDER — SODIUM CHLORIDE 0.9 % IV SOLN
INTRAVENOUS | Status: DC | PRN
  Administered 2014-10-23: 11:00:00 via INTRAVENOUS

## 2014-10-23 MED ORDER — POTASSIUM CHLORIDE CRYS ER 20 MEQ PO TBCR: 20.0000 meq | EXTENDED_RELEASE_TABLET | Freq: Every day | ORAL | Status: DC | PRN

## 2014-10-23 MED ORDER — MIDAZOLAM HCL 5 MG/5ML IJ SOLN
INTRAMUSCULAR | Status: DC | PRN
  Administered 2014-10-23: 2 mg via INTRAVENOUS

## 2014-10-23 SURGICAL SUPPLY — 71 items
CANISTER SUCTION 2500CC (MISCELLANEOUS) ×2 IMPLANT
CANNULA VESSEL 3MM 2 BLNT TIP (CANNULA) ×4 IMPLANT
CLIP TI MEDIUM 24 (CLIP) ×2 IMPLANT
CLIP TI WIDE RED SMALL 24 (CLIP) ×2 IMPLANT
COVER SURGICAL LIGHT HANDLE (MISCELLANEOUS) ×2 IMPLANT
ELECT BLADE 4.0 EZ CLEAN MEGAD (MISCELLANEOUS) ×2
ELECT BLADE 6.5 EXT (BLADE) ×2 IMPLANT
ELECT REM PT RETURN 9FT ADLT (ELECTROSURGICAL) ×2
ELECTRODE BLDE 4.0 EZ CLN MEGD (MISCELLANEOUS) ×1 IMPLANT
ELECTRODE REM PT RTRN 9FT ADLT (ELECTROSURGICAL) ×1 IMPLANT
GAUZE SPONGE 4X4 16PLY XRAY LF (GAUZE/BANDAGES/DRESSINGS) ×2 IMPLANT
GLOVE BIO SURGEON STRL SZ 6.5 (GLOVE) ×4 IMPLANT
GLOVE BIO SURGEON STRL SZ7 (GLOVE) ×2 IMPLANT
GLOVE BIO SURGEON STRL SZ7.5 (GLOVE) ×2 IMPLANT
GLOVE BIOGEL PI IND STRL 6.5 (GLOVE) ×6 IMPLANT
GLOVE BIOGEL PI IND STRL 7.0 (GLOVE) ×2 IMPLANT
GLOVE BIOGEL PI IND STRL 7.5 (GLOVE) ×1 IMPLANT
GLOVE BIOGEL PI IND STRL 8 (GLOVE) ×1 IMPLANT
GLOVE BIOGEL PI INDICATOR 6.5 (GLOVE) ×6
GLOVE BIOGEL PI INDICATOR 7.0 (GLOVE) ×2
GLOVE BIOGEL PI INDICATOR 7.5 (GLOVE) ×1
GLOVE BIOGEL PI INDICATOR 8 (GLOVE) ×1
GLOVE ECLIPSE 6.5 STRL STRAW (GLOVE) ×4 IMPLANT
GLOVE ECLIPSE 7.0 STRL STRAW (GLOVE) ×2 IMPLANT
GOWN STRL REUS W/ TWL LRG LVL3 (GOWN DISPOSABLE) ×7 IMPLANT
GOWN STRL REUS W/ TWL XL LVL3 (GOWN DISPOSABLE) ×1 IMPLANT
GOWN STRL REUS W/TWL LRG LVL3 (GOWN DISPOSABLE) ×7
GOWN STRL REUS W/TWL XL LVL3 (GOWN DISPOSABLE) ×1
GRAFT HEMASHIELD 12X7MM (Vascular Products) ×2 IMPLANT
HEMOSTAT SURGICEL 2X14 (HEMOSTASIS) ×2 IMPLANT
INSERT FOGARTY 61MM (MISCELLANEOUS) ×4 IMPLANT
INSERT FOGARTY SM (MISCELLANEOUS) ×4 IMPLANT
KIT BASIN OR (CUSTOM PROCEDURE TRAY) ×2 IMPLANT
KIT ROOM TURNOVER OR (KITS) ×2 IMPLANT
LIQUID BAND (GAUZE/BANDAGES/DRESSINGS) ×6 IMPLANT
NEEDLE 18GX1X1/2 (RX/OR ONLY) (NEEDLE) ×2 IMPLANT
NS IRRIG 1000ML POUR BTL (IV SOLUTION) ×4 IMPLANT
PACK AORTA (CUSTOM PROCEDURE TRAY) ×2 IMPLANT
PAD ARMBOARD 7.5X6 YLW CONV (MISCELLANEOUS) ×4 IMPLANT
PROBE PENCIL 8 MHZ STRL DISP (MISCELLANEOUS) ×2 IMPLANT
RETAINER VISCERA MED (MISCELLANEOUS) ×2 IMPLANT
SPONGE SURGIFOAM ABS GEL 100 (HEMOSTASIS) IMPLANT
SUT ETHIBOND 5 LR DA (SUTURE) IMPLANT
SUT PDS AB 1 TP1 54 (SUTURE) ×4 IMPLANT
SUT PROLENE 2 0 MH 48 (SUTURE) IMPLANT
SUT PROLENE 3 0 SH 48 (SUTURE) IMPLANT
SUT PROLENE 3 0 SH1 36 (SUTURE) ×6 IMPLANT
SUT PROLENE 5 0 C 1 24 (SUTURE) ×2 IMPLANT
SUT PROLENE 5 0 C 1 36 (SUTURE) ×6 IMPLANT
SUT PROLENE 5 0 C1 (SUTURE) ×2 IMPLANT
SUT PROLENE 6 0 BV (SUTURE) ×4 IMPLANT
SUT SILK 2 0 (SUTURE) ×1
SUT SILK 2 0 SH CR/8 (SUTURE) ×2 IMPLANT
SUT SILK 2 0 TIES 17X18 (SUTURE) ×1
SUT SILK 2-0 18XBRD TIE 12 (SUTURE) ×1 IMPLANT
SUT SILK 2-0 18XBRD TIE BLK (SUTURE) ×1 IMPLANT
SUT SILK 3 0 (SUTURE) ×1
SUT SILK 3 0 TIES 17X18 (SUTURE) ×1
SUT SILK 3-0 18XBRD TIE 12 (SUTURE) ×1 IMPLANT
SUT SILK 3-0 18XBRD TIE BLK (SUTURE) ×1 IMPLANT
SUT SILK 4 0 (SUTURE) ×1
SUT SILK 4-0 18XBRD TIE 12 (SUTURE) ×1 IMPLANT
SUT VIC AB 2-0 CTB1 (SUTURE) ×6 IMPLANT
SUT VIC AB 3-0 SH 27 (SUTURE) ×3
SUT VIC AB 3-0 SH 27X BRD (SUTURE) ×3 IMPLANT
SUT VICRYL 4-0 PS2 18IN ABS (SUTURE) ×8 IMPLANT
SYR 5ML LL (SYRINGE) ×2 IMPLANT
SYRINGE 20CC LL (MISCELLANEOUS) ×2 IMPLANT
TOWEL BLUE STERILE X RAY DET (MISCELLANEOUS) ×4 IMPLANT
TRAY FOLEY CATH 16FRSI W/METER (SET/KITS/TRAYS/PACK) ×2 IMPLANT
WATER STERILE IRR 1000ML POUR (IV SOLUTION) ×4 IMPLANT

## 2014-10-23 NOTE — Op Note (Signed)
NAME: Felicia DixonCarole D Alvis   MRN: [id number] DOB: 10/09/1966    DATE OF OPERATION: 10/23/2014  PREOP DIAGNOSIS: Aortic occlusion with disabling claudication   POSTOP DIAGNOSIS: same  PROCEDURE: aortobifemoral bypass with 12 mm x 7 mm bifurcated Dacron graft  SURGEON: Di Kindlehristopher S. Edilia Boickson, MD, FACS  ASSIST: Dr. Leonides SakeBrian Chen, Karsten RoKim Trinh, [facility]  ANESTHESIA: Gen.   EBL: per anesthesia record  INDICATIONS: Felicia [name] is a 48 y.o. female who I have been following with aortoiliac occlusive disease. Her symptoms progressed and became unbearable. She wished to pursue revascularization. She underwent preoperative cardiac clearance. Her arteries are very small.  FINDINGS: the proximal anastomosis was end-to-end. There was excellent backbleeding from the inferior mesenteric artery so I did not reimplant this.  TECHNIQUE: The patient was taken to the operating room after monitoring lines were placed by anesthesia. The patient received a general anesthetic. The abdomen and groins were prepped and draped in usual fashion.  On the right side an oblique incision was made just above the inguinal crease. The dissection was carried down through the subcutaneous tissue and then the incision was extended longitudinally to expose the common femoral artery. Through this incision and was able to expose the common femoral artery, deep femoral artery, and superficial femoral artery. The retroperitoneal tunnel was started. Likewise, on the left side,  An oblique incision was made just above the inguinal crease. Dissection was carried down longitudinally to the common femoral artery. The common femoral artery, superficial femoral artery, and deep femoral arteries were controlled. The retroperitoneal tunnel was started.  Next the abdomen was entered through a midline incision. The transverse colon and omentum were reflected superiorly. The small bowel was reflected to the right. Exploratory laparotomy showed no other  significant intra-abdominal pathology. The appendix and gallbladder were normal. The liver and uterus were normal. The peritoneal tissue was divided and the infrarenal aorta identified. There was significant scar tissue around the infrarenal aorta making this dissection difficult. The patient had plaque extending up to the level of the renal artery and therefore I had to clamp above the left renal artery. In order to provide adequate exposure I divided the left renal vein between clamps. Each end was oversewn with a 5-0 Prolene suture. This allowed adequate exposure of the juxtarenal aorta. The safest place to apply a clamp was between the right renal artery superiorly and the left renal artery below. The superior mesenteric artery was fairly close to the right renal artery site did not want to clamp above it if at all possible. Next enough of the artery distally was dissected free so that it could be clamped. 2 retroperitoneal tunnels were completed and umbilical tapes were passed for both groins.   The patient was heparinized and received 25 g of mannitol. The juxtarenal aorta was clamped between the level of the right renal artery and the left renal artery. The left renal artery was below the clamp and was controlled with a vessel loop. A clamp was applied distally. The aorta was divided. This was divided transversely. A segment of aorta was excised and then the distal end oversewn with a running 3-0 Prolene suture. There was good hemostasis. A 12 x 7 bifurcated graft was selected and cut the appropriate length. It was sewn into into the infrarenal aorta using a felt cuff with running 3-0 Prolene suture. Proximal anastomosis was tested and was hemostatic. The graft was flushed. Each of the limbs were brought through the previously created tunnels for  anastomosis to the femoral arteries.  On the right side the common femoral artery was clamped proximally and the superficial and deep femoral arteries  controlled. A longitudinal arteriotomy was made in the anterior lateral aspect of the common femoral artery. The right limb of the graft was cut the appropriate length, spatulated, and sewn end-to-side to the common femoral artery using continuous 5-0 Prolene suture. Prior to completing the anastomosis the arteries were backbled and flushed properly and the anastomosis completed. Flow was reestablished to the right leg was the patient tolerated from a hemodynamic standpoint.  On the left side, the common femoral artery was clamped proximally and distally and a longitudinal arteriotomy was made. The left limb of graft was cut the appropriate length, spatulated, and sewn end-to-side to the common femoral artery using continuous 5-0 Prolene suture. Prior to completing this anastomosis the artery was backbled and flushed properly and the anastomosis completed. The patient tolerated this also.  The patient's heparin was partially reversed with protamine. The abdominal contents were returned to normal position after hemostasis was obtained. The retroperitoneal tissue was closed over the graft with running 2-0 Vicryl suture. The fascia was closed with 2 #1 PDS sutures. The subcutaneous layer and the abdomen was closed with 2 3-0 Vicryl's. The skin was closed with a 4-0 Vicryl stitch. Hemostasis was obtained in the groin wounds. Each of these wounds was closed with deep layer of 2-0 Vicryl, subcutaneous layer of 2-0 Vicryl, and the skin closed with a 4-0 Vicryl stitch. Dermabond was applied. The patient tolerated the procedure well and was transferred to the recovery room in stable condition. All needle and sponge counts were correct.    Waverly Ferrarihristopher Massa Pe, MD, FACS [facility]  DATE OF DICTATION:   10/23/2014

## 2014-10-23 NOTE — Anesthesia Procedure Notes (Signed)
Procedure Name: Intubation Date/Time: 10/23/2014 7:41 AM Performed by: Rogelia BogaMUELLER, Marque Bango P Pre-anesthesia Checklist: Patient identified, Emergency Drugs available, Suction available, Patient being monitored and Timeout performed Patient Re-evaluated:Patient Re-evaluated prior to inductionOxygen Delivery Method: Circle system utilized Preoxygenation: Pre-oxygenation with 100% oxygen Intubation Type: IV induction Ventilation: Mask ventilation without difficulty Laryngoscope Size: Mac and 4 Grade View: Grade I Tube type: Oral Tube size: 7.5 mm Number of attempts: 1 Airway Equipment and Method: Stylet Placement Confirmation: ETT inserted through vocal cords under direct vision,  positive ETCO2 and breath sounds checked- equal and bilateral Secured at: 20 cm Tube secured with: Tape Dental Injury: Teeth and Oropharynx as per pre-operative assessment

## 2014-10-23 NOTE — H&P (View-Only) (Signed)
Patient ID: Felicia DixonCarole D Magid, female   DOB: 11/30/1965, 48 y.o.   MRN: [id number]  Reason for Consult: Follow up of peripheral vascular disease   Subjective:     HPI:  Felicia [name] is a 48 y.o. female who I last saw in April of this year. She has known severe aortoiliac occlusive disease based on a previous arteriogram. She also has very small arteries. When I saw her last her lower extremity claudication symptoms were gradually worsening. Her ABIs drop slightly. ABI on the right was 35% an ABI on the left was 37%. We again discussed the importance of tobacco cessation and she was still smoking. We also discussed the importance of a structured walking program. I felt that she would not be an ideal candidate for aortofemoral bypass grafting given that her arteries are very small. She comes in now for a 6 month follow up visit.  She has had progressive worsening of her symptoms with disabling claudication of both lower extremities. She expresses pain in her hips thighs and calves bilaterally which is brought on by ambulation and relieved with rest. She is unable to even vacuum in her house without experiencing significant pain. She had severe pain one night after going out to keep this was about 4-5 months ago. She does deny rest pain or history of nonhealing ulcers.  She had quit smoking in July but after the death of her mother began smoking again although she still trying to quit. She does try to walk every day but can walk only a very short distance before its branching disabling symptoms.  She denies any previous history of myocardial infarction or history of congestive heart failure. She's had no chest pain or chest pressure. Her activity is limited by her peripheral vascular disease.  Past Medical History  Diagnosis Date  . Asthma   . PUD (peptic ulcer disease)   . Anxiety   . Bipolar 1 disorder   . Insomnia   . Hyperlipidemia   . Anemia   . Atrial fibrillation    Family History    Problem Relation Age of Onset  . Heart disease Mother   . Diabetes Mother   . Hyperlipidemia Mother   . Hypertension Mother   . Heart attack Mother   . Heart disease Father   . Diabetes Father   . Heart attack Father   . Diabetes Brother    Past Surgical History  Procedure Laterality Date  . Shoulder surgery    . Nose surgery    . Cesarean section    . Nose surgery    . Back surgery      Short Social History:  History  Substance Use Topics  . Smoking status: Current Every Day Smoker -- 0.50 packs/day for 30 years    Types: Cigarettes    Last Attempt to Quit: 07/13/2012  . Smokeless tobacco: Never Used     Comment: pt states that she is dealing with some stressful events in her life and as a result has began smoking about 3-4 cigs per day  . Alcohol Use: No    Allergies  Allergen Reactions  . Cephalosporins Hives  . Codeine Nausea Only  . Doxycycline Hives  . Tetracyclines & Related Hives  . Reflex Pain Relief [Menthol] Rash    Current Outpatient Prescriptions  Medication Sig Dispense Refill  . albuterol (PROVENTIL HFA;VENTOLIN HFA) 108 (90 BASE) MCG/ACT inhaler Inhale 2 puffs into the lungs every 6 (six) hours as needed. For  shortness of breath    . albuterol (PROVENTIL) (2.5 MG/3ML) 0.083% nebulizer solution Take 2.5 mg by nebulization every 6 (six) hours as needed. For shortness of breath    . aspirin 81 MG tablet Take 81 mg by mouth daily.    . chloroquine (ARALEN) 250 MG tablet Take 1 tablet (250 mg total) by mouth daily. 30 tablet 1  . clonazePAM (KLONOPIN) 1 MG tablet TAKE 1 TABLET BY MOUTH 3 TIMES A DAY AS NEEDED 90 tablet 2  . cyclobenzaprine (FLEXERIL) 10 MG tablet TAKE 1 TABLET BY MOUTH 3 TIMES A DAY AS NEEDED FOR MUSCLE SPASMS 90 tablet 2  . diphenhydrAMINE (BENADRYL) 50 MG capsule Take 50 mg by mouth every 6 (six) hours as needed for itching.    . divalproex (DEPAKOTE ER) 500 MG 24 hr tablet TAKE 2 TABLETS BY MOUTH EVERY DAY AT BEDTIME 180 tablet 1  .  EPINEPHrine (EPI-PEN) 0.3 mg/0.3 mL SOAJ injection Inject 0.3 mLs (0.3 mg total) into the muscle once. 1 Device 2  . ibuprofen (ADVIL,MOTRIN) 200 MG tablet Take 200 mg by mouth every 6 (six) hours as needed.    . Multiple Vitamin (MULTIVITAMIN) tablet Take 1 tablet by mouth daily.    . simvastatin (ZOCOR) 20 MG tablet Take 1 tablet (20 mg total) by mouth at bedtime. 30 tablet 3  . traMADol (ULTRAM) 50 MG tablet Take 1 tablet (50 mg total) by mouth 2 (two) times daily. 60 tablet 0   No current facility-administered medications for this visit.    Review of Systems  Constitutional: Negative for chills and fever.  Eyes: Negative for loss of vision.  Respiratory: Positive for wheezing. Negative for cough.  Cardiovascular: Positive for claudication and leg swelling. Negative for chest pain, chest tightness, dyspnea with exertion, orthopnea and palpitations.  GI: Negative for blood in stool and vomiting.  GU: Negative for dysuria and hematuria.  Musculoskeletal: Negative for leg pain, joint pain and myalgias.  Skin: Negative for rash and wound.  Neurological: Negative for dizziness and speech difficulty.  Hematologic: Negative for bruises/bleeds easily. Psychiatric: Negative for depressed mood.        Objective:  Objective  Filed Vitals:   09/26/14 1134  BP: 146/53  Pulse: 107  Height: 4\' 9"  (1.448 m)  Weight: 134 lb (60.782 kg)  SpO2: 97%   Body mass index is 28.99 kg/(m^2).  Physical Exam  Constitutional: She is oriented to person, place, and time. She appears well-developed and well-nourished.  HENT:  Head: Normocephalic and atraumatic.  Neck: Neck supple. No JVD present. No thyromegaly present.  Cardiovascular: Normal rate, regular rhythm and normal heart sounds.  Exam reveals no friction rub.   No murmur heard. Pulmonary/Chest: Breath sounds normal. She has no wheezes. She has no rales.  Abdominal: Soft. Bowel sounds are normal. There is no tenderness.  I do not palpate an  aneurysm.  Musculoskeletal: Normal range of motion. She exhibits no edema.  Lymphadenopathy:    She has no cervical adenopathy.  Neurological: She is alert and oriented to person, place, and time. She has normal strength. No sensory deficit.  Skin: No lesion and no rash noted.  Psychiatric: She has a normal mood and affect.    Data: CT ANGIO: I have reviewed her CT angiogram with Dr. Tommie Raymond. She has progressive aortoiliac occlusive disease with complete occlusion of the distal aorta just below the IMA origin. Both common iliac arteries are chronically occluded with reconstitution of very small external iliac arteries bilaterally.  Assessment/Plan:     Atherosclerosis of native arteries of extremity with intermittent claudication This patient has progressive ischemia of bilateral lower extremities with disabling claudication. She is unable to even perform her activities of daily living because of her symptoms. CT angiogram today shows that she has gone on to occlude her infrarenal aorta. She reconstitutes her common femoral arteries bilaterally and the bifurcations are patent. My main concern is that her femoral arteries are very small. I measured them at only about 4 mm. I had a long discussion with her today about our options. I would prefer continued conservative treatment with continued attempts at tobacco cessation and a structured walking program. However she feels that her symptoms are simply not bearable. We have discussed the option of aortobifemoral bypass grafting. I have reviewed the potential complications of surgery, including but not limited to, bleeding, wound healing problems, hernia, graft infection, graft thrombosis, or cardiac pulmonary or renal complications. I've explained that since she has a juxtarenal occlusion we would have to clamp above the renal arteries which would put her at slightly increased risk for renal insufficiency. In addition I think she is at  significantly higher risk for graft thrombosis given the small size of her femoral arteries. We'll arrange for preoperative cardiac clearance and once this is complete we can arrange for aortofemoral bypass grafting as she feels that she simply cannot tolerate her symptoms any longer.     Chuck Hinthristopher S Dickson MD [facility]

## 2014-10-23 NOTE — Transfer of Care (Signed)
Immediate Anesthesia Transfer of Care Note  Patient: Felicia [name]  Procedure(s) Performed: Procedure(s): AORTOBIFEMORAL BYPASS GRAFT (N/A)  Patient Location: PACU  Anesthesia Type:General  Level of Consciousness: awake, alert , oriented and patient cooperative  Airway & Oxygen Therapy: Patient Spontanous Breathing and Patient connected to nasal cannula oxygen  Post-op Assessment: Report given to PACU RN, Post -op Vital signs reviewed and stable and Patient moving all extremities X 4  Post vital signs: Reviewed and stable  Complications: No apparent anesthesia complications

## 2014-10-23 NOTE — Anesthesia Preprocedure Evaluation (Signed)
Anesthesia Evaluation  Patient identified by MRN, date of birth, ID band Patient awake    Reviewed: Allergy & Precautions, H&P , NPO status , Patient's Chart, lab work & pertinent test results  Airway Mallampati: II   Neck ROM: full    Dental   Pulmonary shortness of breath, asthma , Current Smoker,          Cardiovascular + Peripheral Vascular Disease + dysrhythmias Atrial Fibrillation     Neuro/Psych  Headaches, Anxiety Depression Bipolar Disorder    GI/Hepatic PUD,   Endo/Other    Renal/GU      Musculoskeletal  (+) Arthritis -,   Abdominal   Peds  Hematology   Anesthesia Other Findings   Reproductive/Obstetrics                             Anesthesia Physical Anesthesia Plan  ASA: II  Anesthesia Plan: General   Post-op Pain Management:    Induction: Intravenous  Airway Management Planned: Oral ETT  Additional Equipment: Arterial line, CVP and Ultrasound Guidance Line Placement  Intra-op Plan:   Post-operative Plan: Extubation in OR  Informed Consent: I have reviewed the patients History and Physical, chart, labs and discussed the procedure including the risks, benefits and alternatives for the proposed anesthesia with the patient or authorized representative who has indicated his/her understanding and acceptance.     Plan Discussed with: CRNA, Anesthesiologist and Surgeon  Anesthesia Plan Comments:         Anesthesia Quick Evaluation

## 2014-10-23 NOTE — Anesthesia Postprocedure Evaluation (Signed)
Anesthesia Post Note  Patient: Felicia DixonCarole D [name]  Procedure(s) Performed: Procedure(s) (LRB): AORTOBIFEMORAL BYPASS GRAFT (N/A)  Anesthesia type: General  Patient location: PACU  Post pain: Pain level controlled and Adequate analgesia  Post assessment: Post-op Vital signs reviewed, Patient's Cardiovascular Status Stable, Respiratory Function Stable, Patent Airway and Pain level controlled  Last Vitals:  Filed Vitals:   10/23/14 1445  BP:   Pulse: 102  Temp:   Resp: 21    Post vital signs: Reviewed and stable  Level of consciousness: awake, alert  and oriented  Complications: No apparent anesthesia complications

## 2014-10-23 NOTE — Progress Notes (Signed)
   VASCULAR SURGERY ASSESSMENT & PLAN:  * Stable post op  * Pain adequately conrolled   SUBJECTIVE: Moderate incisional discomfort  PHYSICAL EXAM: Filed Vitals:   10/23/14 1415 10/23/14 1430 10/23/14 1445 10/23/14 1500  BP: 141/56 148/57  140/55  Pulse: 100 102 102 102  Temp:    98.3 F (36.8 C)  Resp: 21 21 21 16   Weight:      SpO2: 100% 100% 99% 99%   Brisk doppler signals both feet.   LABS: Lab Results  Component Value Date   WBC 8.5 10/23/2014   HGB 9.8* 10/23/2014   HCT 29.9* 10/23/2014   MCV 92.0 10/23/2014   PLT 194 10/23/2014   Lab Results  Component Value Date   CREATININE 0.71 10/23/2014   Lab Results  Component Value Date   INR 1.19 10/23/2014   Active Problems:   Atherosclerosis of native artery of both lower extremities with intermittent claudication   Cari CarawayChris Nikyah Lackman Beeper: [phone] 10/23/2014

## 2014-10-23 NOTE — Interval H&P Note (Signed)
History and Physical Interval Note:  10/23/2014 7:15 AM  Felicia [name]  has presented today for surgery, with the diagnosis of Peripheral vascular disease with claudication I70.213  The various methods of treatment have been discussed with the patient and family. After consideration of risks, benefits and other options for treatment, the patient has consented to  Procedure(s): AORTOBIFEMORAL BYPASS GRAFT (N/A) as a surgical intervention .  The patient's history has been reviewed, patient examined, no change in status, stable for surgery.  I have reviewed the patient's chart and labs.  Questions were answered to the patient's satisfaction.     Karrisa Didio S

## 2014-10-24 ENCOUNTER — Inpatient Hospital Stay (HOSPITAL_COMMUNITY): Payer: Medicare PPO

## 2014-10-24 ENCOUNTER — Encounter (HOSPITAL_COMMUNITY): Payer: Self-pay | Admitting: Vascular Surgery

## 2014-10-24 LAB — COMPREHENSIVE METABOLIC PANEL
ALT: 17 U/L (ref 0–35)
AST: 16 U/L (ref 0–37)
Albumin: 3.3 g/dL — ABNORMAL LOW (ref 3.5–5.2)
Alkaline Phosphatase: 42 U/L (ref 39–117)
Anion gap: 12 (ref 5–15)
BILIRUBIN TOTAL: 0.5 mg/dL (ref 0.3–1.2)
BUN: 8 mg/dL (ref 6–23)
CALCIUM: 7.9 mg/dL — AB (ref 8.4–10.5)
CHLORIDE: 103 meq/L (ref 96–112)
CO2: 24 meq/L (ref 19–32)
CREATININE: 0.7 mg/dL (ref 0.50–1.10)
Glucose, Bld: 163 mg/dL — ABNORMAL HIGH (ref 70–99)
Potassium: 3.4 mEq/L — ABNORMAL LOW (ref 3.7–5.3)
Sodium: 139 mEq/L (ref 137–147)
Total Protein: 5.8 g/dL — ABNORMAL LOW (ref 6.0–8.3)

## 2014-10-24 LAB — BLOOD GAS, ARTERIAL
Acid-base deficit: 0.1 mmol/L (ref 0.0–2.0)
BICARBONATE: 24.5 meq/L — AB (ref 20.0–24.0)
FIO2: 0.21 %
O2 SAT: 91.4 %
PATIENT TEMPERATURE: 98.6
TCO2: 25.8 mmol/L (ref 0–100)
pCO2 arterial: 43.3 mmHg (ref 35.0–45.0)
pH, Arterial: 7.371 (ref 7.350–7.450)
pO2, Arterial: 61.5 mmHg — ABNORMAL LOW (ref 80.0–100.0)

## 2014-10-24 LAB — MAGNESIUM: MAGNESIUM: 1.4 mg/dL — AB (ref 1.5–2.5)

## 2014-10-24 LAB — CBC
HCT: 31.5 % — ABNORMAL LOW (ref 36.0–46.0)
HEMOGLOBIN: 10.3 g/dL — AB (ref 12.0–15.0)
MCH: 30 pg (ref 26.0–34.0)
MCHC: 32.7 g/dL (ref 30.0–36.0)
MCV: 91.8 fL (ref 78.0–100.0)
PLATELETS: 206 10*3/uL (ref 150–400)
RBC: 3.43 MIL/uL — AB (ref 3.87–5.11)
RDW: 13.1 % (ref 11.5–15.5)
WBC: 10.2 10*3/uL (ref 4.0–10.5)

## 2014-10-24 LAB — AMYLASE: Amylase: 19 U/L (ref 0–105)

## 2014-10-24 MED ORDER — HYDROMORPHONE 0.3 MG/ML IV SOLN
INTRAVENOUS | Status: DC
  Administered 2014-10-24: 2.1 mg via INTRAVENOUS
  Administered 2014-10-24: 11:00:00 via INTRAVENOUS
  Administered 2014-10-24 – 2014-10-25 (×2): 1.8 mg via INTRAVENOUS
  Administered 2014-10-25 (×2): 1.5 mg via INTRAVENOUS
  Administered 2014-10-25: 0.3 mg via INTRAVENOUS
  Administered 2014-10-25: 1.2 mg via INTRAVENOUS
  Administered 2014-10-26: 0.3 mg via INTRAVENOUS
  Filled 2014-10-24 (×2): qty 25

## 2014-10-24 MED ORDER — POTASSIUM CHLORIDE IN NACL 20-0.45 MEQ/L-% IV SOLN
INTRAVENOUS | Status: DC
  Administered 2014-10-24: 22:00:00 via INTRAVENOUS
  Administered 2014-10-24: 125 mL/h via INTRAVENOUS
  Administered 2014-10-25 – 2014-10-27 (×4): via INTRAVENOUS
  Filled 2014-10-24 (×12): qty 1000

## 2014-10-24 MED ORDER — SODIUM CHLORIDE 0.9 % IV BOLUS (SEPSIS)
500.0000 mL | Freq: Once | INTRAVENOUS | Status: AC
  Administered 2014-10-24: 500 mL via INTRAVENOUS

## 2014-10-24 MED ORDER — SODIUM CHLORIDE 0.9 % IJ SOLN: 9.0000 mL | INTRAMUSCULAR | Status: DC | PRN

## 2014-10-24 MED ORDER — NALOXONE HCL 0.4 MG/ML IJ SOLN: 0.4000 mg | INTRAMUSCULAR | Status: DC | PRN

## 2014-10-24 MED ORDER — POTASSIUM CHLORIDE 10 MEQ/50ML IV SOLN
10.0000 meq | INTRAVENOUS | Status: AC
  Administered 2014-10-24 (×2): 10 meq via INTRAVENOUS
  Filled 2014-10-24 (×2): qty 50

## 2014-10-24 MED FILL — Heparin Sodium (Porcine) Inj 1000 Unit/ML: INTRAMUSCULAR | Qty: 30 | Status: AC

## 2014-10-24 MED FILL — Sodium Chloride IV Soln 0.9%: INTRAVENOUS | Qty: 2000 | Status: AC

## 2014-10-24 NOTE — Plan of Care (Signed)
Problem: Phase I Progression Outcomes Goal: Hemodynamically stable Outcome: Progressing     

## 2014-10-24 NOTE — Progress Notes (Signed)
   VASCULAR SURGERY ASSESSMENT & PLAN:  * 1 Day Post-Op s/p: Aortofemoral Bypass  *  Tachycardia: Likely secondary to pain and possibly a little dry. Will give some NS and try dilaudid PCA.   * Pulmonary: stable  * Renal: stable. Clamp was above one renal only.   * Need to leave in ICU while still tachycardic. Consider transfer to 2W if HR comes down.   * Mobilize.  SUBJECTIVE: Moderate discomfort. No flatus. No BM.  PHYSICAL EXAM: Filed Vitals:   10/24/14 0401 10/24/14 0500 10/24/14 0530 10/24/14 0600  BP:  120/69 121/58 110/47  Pulse:  122 120 121  Temp: 98.6 F (37 C)     TempSrc: Oral     Resp:  19 17 19   Weight:      SpO2:  97% 100% 99%   NGT = 200 cc total  Lungs clear. Palpable right DP and left PT Feet warm Incisions look fine.   LABS: Lab Results  Component Value Date   WBC 10.2 10/24/2014   HGB 10.3* 10/24/2014   HCT 31.5* 10/24/2014   MCV 91.8 10/24/2014   PLT 206 10/24/2014   Lab Results  Component Value Date   CREATININE 0.70 10/24/2014   Lab Results  Component Value Date   INR 1.19 10/23/2014   Active Problems:   Atherosclerosis of native artery of both lower extremities with intermittent claudication   Cari CarawayChris Brinly Maietta Beeper: [phone] 10/24/2014

## 2014-10-24 NOTE — Plan of Care (Signed)
Problem: Phase I Progression Outcomes Goal: Pain controlled with appropriate interventions Outcome: Progressing     

## 2014-10-24 NOTE — Evaluation (Signed)
Physical Therapy Evaluation Patient Details Name: Felicia DixonCarole D Noren MRN: [id number] DOB: 02/14/1966 Today's Date: 10/24/2014   History of Present Illness  48 y.o. female s/p AORTOBIFEMORAL BYPASS GRAFT with history of asthma and atrial fibrillation.  Clinical Impression  Patient is seen following the above procedure and presents with functional limitations due to the deficits listed below (see PT Problem List). Ambulates up to 150 feet today with min guard assist while using a rolling walker; HR up to 141 bpm -- 128 at rest.) Min assist for transfers. She is determined to return home and has 24 hour supervision from family. Patient will benefit from skilled PT to increase their independence and safety with mobility to allow discharge to the venue listed below.       Follow Up Recommendations Home health PT;Supervision for mobility/OOB    Equipment Recommendations  None recommended by PT    Recommendations for Other Services OT consult     Precautions / Restrictions Precautions Precautions: Fall Restrictions Weight Bearing Restrictions: No      Mobility  Bed Mobility                  Transfers Overall transfer level: Needs assistance Equipment used: Rolling walker (2 wheeled) Transfers: Sit to/from Stand Sit to Stand: Min assist         General transfer comment: Min assist for boost to stand from recliner. VC for hand placement.  Ambulation/Gait Ambulation/Gait assistance: Min guard;+2 safety/equipment Ambulation Distance (Feet): 150 Feet Assistive device: Rolling walker (2 wheeled) Gait Pattern/deviations: Step-through pattern;Decreased stride length Gait velocity: decreased   General Gait Details: Educated on safe DME use with a rolling walker. VC for forward gaze. No loss of balance noted while using rolling walker for support. Focused on stride length and endurance with cues for pursed lip breathing as SpO2 lowered to 92% on room air. Second person managed  lines/leads. HR  up to 141 at highest recording during bout.  Stairs            Wheelchair Mobility    Modified Rankin (Stroke Patients Only)       Balance Overall balance assessment: Needs assistance Sitting-balance support: No upper extremity supported;Feet supported Sitting balance-Leahy Scale: Fair Sitting balance - Comments: Difficulty flexing trunk forward due to incisional pain.   Standing balance support: No upper extremity supported Standing balance-Leahy Scale: Fair                               Pertinent Vitals/Pain Pain Assessment: 0-10 Pain Score: 4  Pain Location: abdomen Pain Descriptors / Indicators: Pressure Pain Intervention(s): Monitored during session;Repositioned  BP rest - 125/56  --- SpO2 96% on 1.5 L  BP standing - 128/84  SpO2 ambulating on room atir 97%  HR 128-141 at rest and ambulating respectively.     Home Living Family/patient expects to be discharged to:: Private residence Living Arrangements: Spouse/significant other;Children;Parent Available Help at Discharge: Family;Available 24 hours/day Type of Home: House Home Access: Stairs to enter Entrance Stairs-Rails: Right Entrance Stairs-Number of Steps: 4 Home Layout: Two level Home Equipment: Walker - 2 wheels;Cane - single point;Bedside commode;Shower seat - built in      Prior Function Level of Independence: Independent               Hand Dominance   Dominant Hand:  (both)    Extremity/Trunk Assessment   Upper Extremity Assessment: Defer to OT evaluation  Lower Extremity Assessment: Generalized weakness         Communication   Communication: No difficulties  Cognition Arousal/Alertness: Awake/alert Behavior During Therapy: WFL for tasks assessed/performed Overall Cognitive Status: Within Functional Limits for tasks assessed                      General Comments General comments (skin integrity, edema, etc.): Resting HR  128    Exercises General Exercises - Lower Extremity Ankle Circles/Pumps: AROM;Both;10 reps;Seated Quad Sets: Strengthening;Both;10 reps;Seated Gluteal Sets: Strengthening;Both;10 reps;Seated      Assessment/Plan    PT Assessment Patient needs continued PT services  PT Diagnosis Abnormality of gait;Generalized weakness;Acute pain   PT Problem List Decreased strength;Decreased range of motion;Decreased activity tolerance;Decreased balance;Decreased mobility;Decreased knowledge of use of DME;Cardiopulmonary status limiting activity;Pain  PT Treatment Interventions DME instruction;Gait training;Stair training;Functional mobility training;Therapeutic activities;Therapeutic exercise;Balance training;Neuromuscular re-education;Patient/family education;Modalities   PT Goals (Current goals can be found in the Care Plan section) Acute Rehab PT Goals Patient Stated Goal: Go home PT Goal Formulation: With patient Time For Goal Achievement: 11/07/14 Potential to Achieve Goals: Good    Frequency Min 3X/week   Barriers to discharge        Co-evaluation               End of Session Equipment Utilized During Treatment: Gait belt Activity Tolerance: Patient tolerated treatment well Patient left: in chair;with call bell/phone within reach Nurse Communication: Mobility status;Other (comment) (vitals)         Time: 9562-13081153-1224 PT Time Calculation (min) (ACUTE ONLY): 31 min   Charges:   PT Evaluation $Initial PT Evaluation Tier I: 1 Procedure PT Treatments $Gait Training: 8-22 mins   PT G CodesBerton Mount:          Lisel Siegrist S 10/24/2014, 2:07 PM  Sunday SpillersLogan Secor MauriceBarbour, [place]PT [phone]

## 2014-10-24 NOTE — Progress Notes (Signed)
UR Completed.  Felicia [name] [phone] 12/14/2013  

## 2014-10-24 NOTE — Evaluation (Signed)
Occupational Therapy Evaluation Patient Details Name: Felicia DixonCarole D Rorie MRN: [id number] DOB: 03/16/1966 Today's Date: 10/24/2014    History of Present Illness 48 y.o. female s/p AORTOBIFEMORAL BYPASS GRAFT with history of asthma and atrial fibrillation.   Clinical Impression   Pt admitted with above. She demonstrates the below listed deficits and will benefit from continued OT to maximize safety and independence with BADLs.  Pt is moving well, anticipate good progress as she is very motivated.  Will follow.       Follow Up Recommendations  No OT follow up;Supervision/Assistance - 24 hour    Equipment Recommendations  None recommended by OT    Recommendations for Other Services       Precautions / Restrictions Precautions Precautions: Fall Precaution Comments: Pt reports freqent falls.  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, educationalclutz"      Mobility Bed Mobility Overal bed mobility: Needs Assistance Bed Mobility: Supine to Sit     Supine to sit: Mod assist     General bed mobility comments: Pt requires instruction and assist to lift trunk   Transfers Overall transfer level: Needs assistance Equipment used: 1 person hand held assist Transfers: Sit to/from BJ'sStand;Stand Pivot Transfers Sit to Stand: Min assist Stand pivot transfers: Min assist       General transfer comment: assist to steady     Balance Overall balance assessment: Needs assistance Sitting-balance support: Feet supported Sitting balance-Leahy Scale: Good     Standing balance support: No upper extremity supported Standing balance-Leahy Scale: Fair                              ADL Overall ADL's : Needs assistance/impaired Eating/Feeding: Independent   Grooming: Wash/dry hands;Wash/dry face;Oral care;Brushing hair;Min guard;Standing   Upper Body Bathing: Set up;Supervision/ safety;Sitting   Lower Body Bathing: Maximal assistance;Sit to/from stand   Upper Body Dressing : Minimal  assistance;Sitting   Lower Body Dressing: Total assistance;Sit to/from stand   Toilet Transfer: Minimal assistance   Toileting- Clothing Manipulation and Hygiene: Minimal assistance;Sit to/from stand       Functional mobility during ADLs: Minimal assistance General ADL Comments: Pt is very motivated.  Unable to access feet at this time due to pain      Vision                     Perception     Praxis      Pertinent Vitals/Pain Pain Assessment: 0-10 Pain Score: 6  Pain Location: abdomen Pain Descriptors / Indicators: Aching;Sharp Pain Intervention(s): Monitored during session;Limited activity within patient's tolerance;Repositioned     Hand Dominance     Extremity/Trunk Assessment Upper Extremity Assessment Upper Extremity Assessment: Overall WFL for tasks assessed   Lower Extremity Assessment Lower Extremity Assessment: Defer to PT evaluation   Cervical / Trunk Assessment Cervical / Trunk Assessment: Other exceptions   Communication Communication Communication: No difficulties   Cognition Arousal/Alertness: Awake/alert Behavior During Therapy: WFL for tasks assessed/performed Overall Cognitive Status: Within Functional Limits for tasks assessed                     General Comments       Exercises       Shoulder Instructions      Home Living Family/patient expects to be discharged to:: Private residence Living Arrangements: Spouse/significant other;Children;Parent Available Help at Discharge: Family;Available 24 hours/day Type of Home: House Home Access: Stairs to enter Entergy CorporationEntrance Stairs-Number  of Steps: 4 Entrance Stairs-Rails: Right Home Layout: Two level Alternate Level Stairs-Number of Steps: 16 Alternate Level Stairs-Rails: Right Bathroom Shower/Tub: Tub/shower unit Shower/tub characteristics: Curtain FirefighterBathroom Toilet: Standard     Home Equipment: Environmental consultantWalker - 2 wheels;Cane - single point;Bedside commode;Shower seat - built in           Prior Functioning/Environment Level of Independence: Independent             OT Diagnosis: Generalized weakness;Acute pain   OT Problem List: Decreased strength;Decreased activity tolerance;Impaired balance (sitting and/or standing);Decreased knowledge of precautions;Cardiopulmonary status limiting activity;Pain   OT Treatment/Interventions: Self-care/ADL training;DME and/or AE instruction;Therapeutic activities;Patient/family education;Balance training    OT Goals(Current goals can be found in the care plan section) Acute Rehab OT Goals Patient Stated Goal: Go home OT Goal Formulation: With patient Time For Goal Achievement: 11/07/14 Potential to Achieve Goals: Good ADL Goals Pt Will Perform Grooming: with supervision;standing Pt Will Perform Upper Body Bathing: with set-up;sitting Pt Will Perform Lower Body Bathing: with supervision;sit to/from stand Pt Will Perform Upper Body Dressing: with set-up;sitting Pt Will Perform Lower Body Dressing: with supervision;sit to/from stand Pt Will Transfer to Toilet: with supervision;ambulating;regular height toilet;grab bars Pt Will Perform Toileting - Clothing Manipulation and hygiene: with supervision;sit to/from stand Pt Will Perform Tub/Shower Transfer: Tub transfer;with min guard assist;ambulating  OT Frequency: Min 2X/week   Barriers to D/C:            Co-evaluation              End of Session Equipment Utilized During Treatment: Oxygen Nurse Communication: Mobility status  Activity Tolerance: Patient limited by pain Patient left: in chair;with call bell/phone within reach;with family/visitor present   Time: [id number] OT Time Calculation (min): 49 min Charges:  OT General Charges $OT Visit: 1 Procedure OT Evaluation $Initial OT Evaluation Tier I: 1 Procedure OT Treatments $Therapeutic Activity: 23-37 mins G-Codes:    Irene Collings M 10/24/2014, 6:30 PM

## 2014-10-24 NOTE — Plan of Care (Signed)
Problem: Phase I Progression Outcomes Goal: O2 [place] to maintain Sats > 90% (post extubation) Outcome: Progressing

## 2014-10-25 ENCOUNTER — Telehealth: Payer: Self-pay | Admitting: Family Medicine

## 2014-10-25 MED ORDER — VANCOMYCIN HCL IN DEXTROSE 1-5 GM/200ML-% IV SOLN
1000.0000 mg | Freq: Two times a day (BID) | INTRAVENOUS | Status: DC
  Filled 2014-10-25: qty 200

## 2014-10-25 MED ORDER — BISACODYL 10 MG RE SUPP: 10.0000 mg | Freq: Every day | RECTAL | Status: DC | PRN

## 2014-10-25 MED ORDER — BISACODYL 10 MG RE SUPP
10.0000 mg | Freq: Every day | RECTAL | Status: DC
  Administered 2014-10-25: 10 mg via RECTAL
  Filled 2014-10-25: qty 1

## 2014-10-25 MED ORDER — CEFAZOLIN SODIUM 1-5 GM-% IV SOLN: 1.0000 g | Freq: Three times a day (TID) | INTRAVENOUS | Status: DC

## 2014-10-25 MED ORDER — VANCOMYCIN HCL IN DEXTROSE 1-5 GM/200ML-% IV SOLN
1000.0000 mg | Freq: Once | INTRAVENOUS | Status: AC
  Administered 2014-10-25: 1000 mg via INTRAVENOUS
  Filled 2014-10-25: qty 200

## 2014-10-25 MED ORDER — VANCOMYCIN HCL IN DEXTROSE 750-5 MG/150ML-% IV SOLN
750.0000 mg | Freq: Two times a day (BID) | INTRAVENOUS | Status: DC
  Administered 2014-10-25 – 2014-10-27 (×4): 750 mg via INTRAVENOUS
  Filled 2014-10-25 (×5): qty 150

## 2014-10-25 MED ORDER — OXYCODONE-ACETAMINOPHEN 5-325 MG PO TABS
1.0000 | ORAL_TABLET | Freq: Four times a day (QID) | ORAL | Status: DC | PRN
  Administered 2014-10-25 – 2014-10-27 (×7): 2 via ORAL
  Filled 2014-10-25 (×7): qty 2

## 2014-10-25 NOTE — Progress Notes (Signed)
   VASCULAR SURGERY ASSESSMENT & PLAN:  * 2 Days Post-Op s/p: Aortofemoral bypass. Palpable right DP, Left PT pulse  * Mild cellulitis so will continue Ancef for now  * GI/Nutrition: No BS yet. No flatus or BM, so keep NPO for now except ice chips. Will try a dulcolax to help get her bowels going.   * Tachycardia better.  * po pain meds with sips of H2O and ween PCA  * D/C central line.     * Transfer to 2W.  * Anticipate D/C Sunday/Monday when tolerating diet and pain well controlled.   SUBJECTIVE: No nausea. No flatus. No BM. Ambulated yesterday.   PHYSICAL EXAM: Filed Vitals:   10/25/14 0600 10/25/14 0700 10/25/14 0708 10/25/14 0721  BP:  105/48    Pulse: 117 117    Temp:   98.9 F (37.2 C)   TempSrc:   Oral   Resp: 23 24  18   Height:      Weight:      SpO2: 98% 98%  100%   Abdomen: no bowel sounds yet Lungs clear Palpable right DP and left PT pulse Incisions fine. Mild cellulites thighs.  LABS: No new labs  Active Problems:   Atherosclerosis of native artery of both lower extremities with intermittent claudication   Cari CarawayChris Dickson Beeper: [phone] 10/25/2014

## 2014-10-25 NOTE — Progress Notes (Signed)
Pt had small bowel movement and bowel sounds now audible.  MD aware.  Advancing to clear liquid diet per verbal order.  Will con't plan of care.

## 2014-10-25 NOTE — Plan of Care (Signed)
Problem: Phase I Progression Outcomes Goal: O2 [place] to maintain Sats > 90% (post extubation) Outcome: Completed/Met Date Met:  10/25/14 Goal: Hemodynamically stable Outcome: Progressing Goal: Pain controlled with appropriate interventions Outcome: Completed/Met Date Met:  10/25/14

## 2014-10-25 NOTE — Plan of Care (Signed)
Problem: Phase II Progression Outcomes Goal: Weaning O2 to maintain Sats > 90% Outcome: Completed/Met Date Met:  10/25/14 Goal: Tolerates clear liquids without nausea/vomiting Outcome: Completed/Met Date Met:  10/25/14

## 2014-10-25 NOTE — Telephone Encounter (Signed)
rx called in

## 2014-10-25 NOTE — Progress Notes (Addendum)
ANTIBIOTIC CONSULT NOTE - INITIAL  Pharmacy Consult for Vancomycin  Indication: cellulitis  Allergies  Allergen Reactions  . Bee Pollen Swelling  . Cephalosporins Hives  . Codeine Nausea Only  . Doxycycline Hives  . Tetracyclines & Related Hives  . Reflex Pain Relief [Menthol] Rash    Muscle Rub    Patient Measurements: Height: 4\' 9"  (144.8 cm) Weight: 141 lb 6.4 oz (64.139 kg) IBW/kg (Calculated) : 38.6 Adjusted Body Weight: 50 kg  Vital Signs: Temp: 98.9 F (37.2 C) (12/03 0708) Temp Source: Oral (12/03 0708) BP: 105/48 mmHg (12/03 0700) Pulse Rate: 117 (12/03 0700) Intake/Output from previous day: 12/02 0701 - 12/03 0700 In: 3656.2 [I.V.:2856.2; IV Piggyback:800] Out: 1610 [Urine:1310; Emesis/NG output:300] Intake/Output from this shift:    Labs:  Recent Labs  10/23/14 1330 10/24/14 0410  WBC 8.5 10.2  HGB 9.8* 10.3*  PLT 194 206  CREATININE 0.71 0.70   Estimated Creatinine Clearance: 64.8 mL/min (by C-G formula based on Cr of 0.7). No results for input(s): VANCOTROUGH, VANCOPEAK, VANCORANDOM, GENTTROUGH, GENTPEAK, GENTRANDOM, TOBRATROUGH, TOBRAPEAK, TOBRARND, AMIKACINPEAK, AMIKACINTROU, AMIKACIN in the last 72 hours.   Microbiology: Recent Results (from the past 720 hour(s))  Surgical pcr screen     Status: None   Collection Time: 10/11/14  1:59 PM  Result Value Ref Range Status   MRSA, PCR NEGATIVE NEGATIVE Final   Staphylococcus aureus NEGATIVE NEGATIVE Final    Comment:        The Xpert SA Assay (FDA approved for NASAL specimens in patients over 50 years of age), is one component of a comprehensive surveillance program.  Test performance has been validated by [facility] for patients greater than or equal to 130 year old. It is not intended to diagnose infection nor to guide or monitor treatment.     Medical History: Past Medical History  Diagnosis Date  . Asthma   . PUD (peptic ulcer disease)   . Anxiety   . Bipolar 1 disorder   .  Insomnia   . Hyperlipidemia   . Anemia   . Atrial fibrillation   . Peripheral arterial disease   . Heart murmur   . Shortness of breath dyspnea   . Pneumonia   . Depression   . Headache   . Arthritis     Medications:  Scheduled:  . aspirin  81 mg Oral Daily  . bisacodyl  10 mg Rectal Daily  . divalproex  1,000 mg Oral Daily  . docusate sodium  100 mg Oral Daily  . enoxaparin (LOVENOX) injection  40 mg Subcutaneous Q24H  . HYDROmorphone PCA 0.3 mg/mL   Intravenous 6 times per day  . pantoprazole  40 mg Oral Daily  . traMADol  50 mg Oral BID   Assessment: 48 yo female s/p aortofemoral bypass, now with abdominal wall cellulitis, for empiric anitbiotics.  Last dose of vancomcyin 0800 12/2  Goal of Therapy:  Vancomycin trough level 10-15 mcg/ml  Plan:  Vancomycin 750 mg iv Q 12 hours Continue to follow  Thank you. Okey RegalLisa Kalaya Infantino, PharmD [phone]  10/25/2014,7:42 AM

## 2014-10-25 NOTE — Telephone Encounter (Signed)
Requesting a refill on her klonopin - ? OK to Refill

## 2014-10-26 LAB — TYPE AND SCREEN
ABO/RH(D): A POS
ANTIBODY SCREEN: NEGATIVE
UNIT DIVISION: 0
Unit division: 0

## 2014-10-26 LAB — BASIC METABOLIC PANEL
Anion gap: 9 (ref 5–15)
BUN: 6 mg/dL (ref 6–23)
CO2: 27 mEq/L (ref 19–32)
CREATININE: 0.68 mg/dL (ref 0.50–1.10)
Calcium: 8.5 mg/dL (ref 8.4–10.5)
Chloride: 105 mEq/L (ref 96–112)
GFR calc Af Amer: >90 mL/min (ref >90)
GFR calc non Af Amer: >90 mL/min (ref >90)
Glucose, Bld: 92 mg/dL (ref 70–99)
Potassium: 3.7 mEq/L (ref 3.7–5.3)
Sodium: 141 mEq/L (ref 137–147)

## 2014-10-26 LAB — CBC
HEMATOCRIT: 25.9 % — AB (ref 36.0–46.0)
Hemoglobin: 8.5 g/dL — ABNORMAL LOW (ref 12.0–15.0)
MCH: 30 pg (ref 26.0–34.0)
MCHC: 32.8 g/dL (ref 30.0–36.0)
MCV: 91.5 fL (ref 78.0–100.0)
Platelets: 177 10*3/uL (ref 150–400)
RBC: 2.83 MIL/uL — ABNORMAL LOW (ref 3.87–5.11)
RDW: 12.8 % (ref 11.5–15.5)
WBC: 7.8 10*3/uL (ref 4.0–10.5)

## 2014-10-26 MED ORDER — CLONAZEPAM 1 MG PO TABS: 1.0000 mg | ORAL_TABLET | Freq: Three times a day (TID) | ORAL | Status: DC | PRN

## 2014-10-26 NOTE — Telephone Encounter (Signed)
ok 

## 2014-10-26 NOTE — Progress Notes (Signed)
Pt passed moderate sized blood clot through nose with sneeze.  No further bleeding observable or felt by Pt at this time.  Pt believes this was due to dry air, states it has happened before.  Will con't to monitor.

## 2014-10-26 NOTE — Progress Notes (Signed)
Wasted 2.4mg  hydromorphone HCL in sharps at pyxis station.  Witnessed by Ander PurpuraKim Glover, RN.  PCA DC'd at this time.  Will be prompt with next PO pain med per order.

## 2014-10-26 NOTE — Plan of Care (Signed)
Problem: Phase II Progression Outcomes Goal: Hemodynamically stable without vasopressors Outcome: Completed/Met Date Met:  10/26/14 Goal: Pain controlled Outcome: Completed/Met Date Met:  10/26/14 Goal: Progress activity as tolerated unless otherwise ordered Outcome: Completed/Met Date Met:  10/26/14 Goal: Date transferred to telemetry Outcome: Completed/Met Date Met:  10/26/14 10/25/14 Goal: Discharge plan established Outcome: Completed/Met Date Met:  10/26/14

## 2014-10-26 NOTE — Progress Notes (Signed)
Medicare Important Message given? YES  (If response is "NO", the following Medicare IM given date fields will be blank)  Date Medicare IM given: 10/26/14 Medicare IM given by:  Anaaya Fuster  

## 2014-10-26 NOTE — Telephone Encounter (Signed)
Med called to pharm 

## 2014-10-26 NOTE — Progress Notes (Addendum)
  [facility] Progress Note  10/26/2014 8:30 AM 3 Days Post-Op  Subjective:  Feels bloated today. Passing flatus. Two small BMs yesterday.   Tmax 99 BP sys 90s-110s HR 100s-120s 02 96% RA  Filed Vitals:   10/26/14 0750  BP:   Pulse:   Temp:   Resp: 20    Physical Exam: General: ambulating well without assistance, in NAD Incisions:  Midline abdominal incision clean dry and intact with mild bruising and erythema.  Groin incisions clean and intact with some mild erythema.  Abdomen: soft non-tender, normoactive bowel sounds Extremities:  Palpable pedal pulses bilaterally   CBC    Component Value Date/Time   WBC 10.2 10/24/2014 0410   RBC 3.43* 10/24/2014 0410   HGB 10.3* 10/24/2014 0410   HCT 31.5* 10/24/2014 0410   PLT 206 10/24/2014 0410   MCV 91.8 10/24/2014 0410   MCH 30.0 10/24/2014 0410   MCHC 32.7 10/24/2014 0410   RDW 13.1 10/24/2014 0410   LYMPHSABS 2.7 02/12/2014 0904   MONOABS 0.4 02/12/2014 0904   EOSABS 0.4 02/12/2014 0904   BASOSABS 0.0 02/12/2014 0904    BMET    Component Value Date/Time   NA 139 10/24/2014 0410   K 3.4* 10/24/2014 0410   CL 103 10/24/2014 0410   CO2 24 10/24/2014 0410   GLUCOSE 163* 10/24/2014 0410   BUN 8 10/24/2014 0410   CREATININE 0.70 10/24/2014 0410   CREATININE 0.80 06/05/2014 1420   CALCIUM 7.9* 10/24/2014 0410   GFRNONAA >90 10/24/2014 0410   GFRNONAA 86 06/05/2014 1420   GFRAA >90 10/24/2014 0410   GFRAA >89 06/05/2014 1420    INR    Component Value Date/Time   INR 1.19 10/23/2014 1330     Intake/Output Summary (Last 24 hours) at 10/26/14 0830 Last data filed at 10/26/14 0410  Gross per 24 hour  Intake    480 ml  Output   2900 ml  Net  -2420 ml     Assessment:  48 y.o. female is s/p: aortobifemoral bypass graft  3 Days Post-Op  Plan: -Will continue clear liquids today. Plan to advance tomorrow.  -Still tachycardic, currently 106. Will continue IVF.  -Mild cellulitis: will  continue vancomycin -Off PCA. Po pain meds -Keep groin incisions dry with gauze.  -Continue bowel regimen.  -Ambulate. -Dispo: anticipate discharge in next few days when pain better controlled and tolerating diet.    Maris BergerKimberly Trinh, PA-C [facility] Office: [phone] Pager: [phone] 10/26/2014 8:30 AM     Patient continues to pass flatus.  She has tolerated sips.  She had 2 small stools yesterday.  Abdomen remains soft.  It is tender.  The groin incisions remained intact.  She has palpable pedal pulses.  If she tolerates clears today, we'll advance her diet tomorrow with possible plans for discharge on Sunday.  The patient is currently on vancomycin for possible cellulitis.  We will continue to evaluate this and make the determination for home antibiotics based on her clinical picture.  Durene CalWells Mahsa Hanser

## 2014-10-27 DIAGNOSIS — I70213 Atherosclerosis of native arteries of extremities with intermittent claudication, bilateral legs: Secondary | ICD-10-CM | POA: Diagnosis not present

## 2014-10-27 MED ORDER — PNEUMOCOCCAL VAC POLYVALENT 25 MCG/0.5ML IJ INJ
0.5000 mL | INJECTION | INTRAMUSCULAR | Status: AC
  Administered 2014-10-27: 0.5 mL via INTRAMUSCULAR
  Filled 2014-10-27: qty 0.5

## 2014-10-27 MED ORDER — OXYCODONE-ACETAMINOPHEN 5-325 MG PO TABS: 1.0000 | ORAL_TABLET | Freq: Four times a day (QID) | ORAL | Status: DC | PRN

## 2014-10-27 NOTE — Plan of Care (Signed)
Problem: Phase III Progression Outcomes Goal: O2 Sats > or equal to 90% on room air Outcome: Completed/Met Date Met:  10/27/14

## 2014-10-27 NOTE — Plan of Care (Signed)
Problem: Phase III Progression Outcomes Goal: Pain controlled on oral analgesia Outcome: Completed/Met Date Met:  10/27/14

## 2014-10-27 NOTE — Progress Notes (Signed)
    Subjective  - POD #4, status post aortobifemoral bypass graft  Feels very good today.  Once to go home. Ambulating by herself without difficulty. Positive flatus    Physical Exam:  Abdominal incision and femoral incisions are healing nicely. Decreased abdominal distention Palpable pedal pulses      Assessment/Plan:  POD #4  We'll advance to regular diet.  If she tolerates her breakfast she can go home today.  She tells me she has excellent support at home with her husband and mother-in-law.  I do not see the need for antibiotics at home as the concern for infection I believe has resolved.     Griffen Frayne IV, V. WELLS 10/27/2014 5:45 AM --  Filed Vitals:   10/27/14 0436  BP: 139/58  Pulse: 106  Temp: 98.4 F (36.9 C)  Resp: 18    Intake/Output Summary (Last 24 hours) at 10/27/14 0545 Last data filed at 10/27/14 0437  Gross per 24 hour  Intake   1320 ml  Output   2100 ml  Net   -780 ml     Laboratory CBC    Component Value Date/Time   WBC 7.8 10/26/2014 0910   HGB 8.5* 10/26/2014 0910   HCT 25.9* 10/26/2014 0910   PLT 177 10/26/2014 0910    BMET    Component Value Date/Time   NA 141 10/26/2014 0910   K 3.7 10/26/2014 0910   CL 105 10/26/2014 0910   CO2 27 10/26/2014 0910   GLUCOSE 92 10/26/2014 0910   BUN 6 10/26/2014 0910   CREATININE 0.68 10/26/2014 0910   CREATININE 0.80 06/05/2014 1420   CALCIUM 8.5 10/26/2014 0910   GFRNONAA >90 10/26/2014 0910   GFRNONAA 86 06/05/2014 1420   GFRAA >90 10/26/2014 0910   GFRAA >89 06/05/2014 1420    COAG Lab Results  Component Value Date   INR 1.19 10/23/2014   INR 1.01 10/11/2014   INR 0.9 06/27/2007   No results found for: PTT  Antibiotics Anti-infectives    Start     Dose/Rate Route Frequency Ordered Stop   10/25/14 1800  vancomycin (VANCOCIN) IVPB 1000 mg/200 mL premix  Status:  Discontinued     1,000 mg200 mL/hr over 60 Minutes Intravenous Every 12 hours 10/25/14 0745 10/25/14 1102   10/25/14 1800  vancomycin (VANCOCIN) IVPB 750 mg/150 ml premix     750 mg150 mL/hr over 60 Minutes Intravenous Every 12 hours 10/25/14 1102     10/25/14 0830  vancomycin (VANCOCIN) IVPB 1000 mg/200 mL premix     1,000 mg200 mL/hr over 60 Minutes Intravenous  Once 10/25/14 0745 10/25/14 0920   10/25/14 0730  ceFAZolin (ANCEF) IVPB 1 g/50 mL premix  Status:  Discontinued     1 g100 mL/hr over 30 Minutes Intravenous 3 times per day 10/25/14 0728 10/25/14 0736   10/23/14 1930  vancomycin (VANCOCIN) IVPB 1000 mg/200 mL premix     1,000 mg200 mL/hr over 60 Minutes Intravenous Every 12 hours 10/23/14 1528 10/24/14 0936   10/22/14 1435  vancomycin (VANCOCIN) IVPB 1000 mg/200 mL premix     1,000 mg200 mL/hr over 60 Minutes Intravenous 60 min pre-op 10/22/14 1435 10/23/14 0822       V. Charlena CrossWells Kilynn Fitzsimmons IV, M.D. [facility] [place] Office: [phone] Pager:  [phone]

## 2014-10-27 NOTE — Progress Notes (Signed)
DC IV and tele per MD orders; DC instructions reviewed with patient and family at bedside; pt advised to only take only one of her pain medications (Tramadol VS Percocet); no further questions from patient or family; paper prescription given to patient.  Hermina BartersBOWMAN, Glorie Dowlen M, RN

## 2014-10-30 ENCOUNTER — Telehealth: Payer: Self-pay | Admitting: Vascular Surgery

## 2014-10-30 NOTE — Telephone Encounter (Signed)
LM for pt re appt, dpm °

## 2014-10-30 NOTE — Telephone Encounter (Signed)
-----   Message from Sharee PimpleMarilyn K McChesney, RN sent at 10/29/2014  9:22 AM EST ----- Regarding: Schedule   ----- Message -----    From: Lars MageEmma M Collins, PA-C    Sent: 10/27/2014   6:56 AM      To: Vvs Charge Pool  S/P aorto-bifem by pass F/U with Dr. Edilia Boickson 3-4 weeks.

## 2014-10-31 ENCOUNTER — Telehealth: Payer: Self-pay

## 2014-10-31 DIAGNOSIS — G8918 Other acute postprocedural pain: Secondary | ICD-10-CM

## 2014-10-31 MED ORDER — OXYCODONE-ACETAMINOPHEN 5-325 MG PO TABS: 1.0000 | ORAL_TABLET | Freq: Four times a day (QID) | ORAL | Status: DC | PRN

## 2014-10-31 NOTE — Discharge Summary (Signed)
[facility] Discharge Summary   Patient ID:  Felicia [name] MRN: [id number] DOB/AGE: 50/07/1966 50 y.o.  Admit date: 10/23/2014 Discharge date: 10/31/2014 Date of Surgery: 10/23/2014 Surgeon: Surgeon(s): Chuck Hinthristopher S Dickson, MD Fransisco HertzBrian L Chen, MD  Admission Diagnosis: Peripheral vascular disease with claudication I70.213  Discharge Diagnoses:  Peripheral vascular disease with claudication I70.213  Secondary Diagnoses: Past Medical History  Diagnosis Date  . Asthma   . PUD (peptic ulcer disease)   . Anxiety   . Bipolar 1 disorder   . Insomnia   . Hyperlipidemia   . Anemia   . Atrial fibrillation   . Peripheral arterial disease   . Heart murmur   . Shortness of breath dyspnea   . Pneumonia   . Depression   . Headache   . Arthritis     Procedure(s): AORTOBIFEMORAL BYPASS GRAFT  Discharged Condition: good  HPI: Felicia [name] is a 48 y.o. female who has had progressive worsening of her symptoms with disabling claudication of both lower extremities. She expresses pain in her hips thighs and calves bilaterally which is brought on by ambulation and relieved with rest. She is unable to even vacuum in her house without experiencing significant pain. She had severe pain one night after going out to keep this was about 4-5 months ago. She does deny rest pain or history of nonhealing ulcers.  CT ANGIO: I have reviewed her CT angiogram with Dr. Tommie RaymondGlen Yamagata. She has progressive aortoiliac occlusive disease with complete occlusion of the distal aorta just below the IMA origin. Both common iliac arteries are chronically occluded with reconstitution of very small external iliac arteries bilaterally.  Ambulating voiding and tolerated full breakfast discharged home 10/27/2104.   Hospital Course:  Felicia [name] is a 48 y.o. female is S/P  Procedure(s): AORTOBIFEMORAL BYPASS GRAFT with 12 mm x 7 mm bifurcated Dacron graft.   Tachycardia pod# 1 likely due to pain  control given PCA dilaudid.  Stable POD#2 transferred to 2 W.  Mobilized with PT/OT.  Started clears and progressed as tolerated.      Significant Diagnostic Studies: CBC Lab Results  Component Value Date   WBC 7.8 10/26/2014   HGB 8.5* 10/26/2014   HCT 25.9* 10/26/2014   MCV 91.5 10/26/2014   PLT 177 10/26/2014    BMET    Component Value Date/Time   NA 141 10/26/2014 0910   K 3.7 10/26/2014 0910   CL 105 10/26/2014 0910   CO2 27 10/26/2014 0910   GLUCOSE 92 10/26/2014 0910   BUN 6 10/26/2014 0910   CREATININE 0.68 10/26/2014 0910   CREATININE 0.80 06/05/2014 1420   CALCIUM 8.5 10/26/2014 0910   GFRNONAA >90 10/26/2014 0910   GFRNONAA 86 06/05/2014 1420   GFRAA >90 10/26/2014 0910   GFRAA >89 06/05/2014 1420   COAG Lab Results  Component Value Date   INR 1.19 10/23/2014   INR 1.01 10/11/2014   INR 0.9 06/27/2007     Disposition:  Discharge to :Home Discharge Instructions    Call MD for:  redness, tenderness, or signs of infection (pain, swelling, bleeding, redness, odor or green/yellow discharge around incision site)    Complete by:  As directed      Call MD for:  severe or increased pain, loss or decreased feeling  in affected limb(s)    Complete by:  As directed      Call MD for:  temperature >100.5    Complete by:  As directed  Discharge instructions    Complete by:  As directed   You may shower daily     Driving Restrictions    Complete by:  As directed   No driving for 2 weeks     Lifting restrictions    Complete by:  As directed   No lifting for 12 weeks     Resume previous diet    Complete by:  As directed             Medication List    TAKE these medications        albuterol (2.5 MG/3ML) 0.083% nebulizer solution  Commonly known as:  PROVENTIL  Take 2.5 mg by nebulization every 6 (six) hours as needed. For shortness of breath     albuterol 108 (90 BASE) MCG/ACT inhaler  Commonly known as:  PROVENTIL HFA;VENTOLIN HFA  Inhale 2 puffs  into the lungs every 6 (six) hours as needed. For shortness of breath     aspirin 81 MG tablet  Take 81 mg by mouth daily.     clonazePAM 1 MG tablet  Commonly known as:  KLONOPIN  Take 1 tablet (1 mg total) by mouth 3 (three) times daily as needed.     cyclobenzaprine 10 MG tablet  Commonly known as:  FLEXERIL  TAKE 1 TABLET BY MOUTH 3 TIMES A DAY AS NEEDED FOR MUSCLE SPASMS     diphenhydrAMINE 50 MG capsule  Commonly known as:  BENADRYL  Take 50 mg by mouth every 6 (six) hours as needed for itching.     divalproex 500 MG 24 hr tablet  Commonly known as:  DEPAKOTE ER  TAKE 2 TABLETS BY MOUTH EVERY DAY AT BEDTIME     EPINEPHrine 0.3 mg/0.3 mL Soaj injection  Commonly known as:  EPI-PEN  Inject 0.3 mLs (0.3 mg total) into the muscle once.     ibuprofen 200 MG tablet  Commonly known as:  ADVIL,MOTRIN  Take 200 mg by mouth every 6 (six) hours as needed (for pain).     multivitamin tablet  Take 1 tablet by mouth daily.     oxyCODONE-acetaminophen 5-325 MG per tablet  Commonly known as:  PERCOCET/ROXICET  Take 1-2 tablets by mouth every 6 (six) hours as needed for moderate pain.     simvastatin 20 MG tablet  Commonly known as:  ZOCOR  Take 1 tablet (20 mg total) by mouth at bedtime.     traMADol 50 MG tablet  Commonly known as:  ULTRAM  Take 1 tablet (50 mg total) by mouth 2 (two) times daily.       Verbal and written Discharge instructions given to the patient. Wound care per Discharge AVS     Follow-up Information    Follow up with DICKSON,CHRISTOPHER S, MD In 4 weeks.   Specialty:  Vascular Surgery   Why:  sent message to office   Contact information:   [address] [place] [address] [phone]       Signed: Clinton GallantCOLLINS, Taniaya Rudder [facility] 10/31/2014, 10:13 AM  - For VQI Registry use --- Instructions: Press F2 to tab through selections.  Delete question if not applicable.   Post-op:  Time to Extubation: [x ] In OR, [ ]  < 12 hrs, [ ]  12-24 hrs, [ ]  >=24  hrs Vasopressors Req. Post-op: Yes ICU Stay: 2 days Transfusion: No  If yes, 0 units given MI: [x ] No, [ ]  Troponin only, [ ]  EKG or Clinical New Arrhythmia: No  Complications: CHF:  No Resp failure: [x]  none, [ ]  Pneumonia, [ ]  Ventilator Chg in renal function: [ ]  none, [ ]  Inc. Cr > 0.5, [ ]  Temp. Dialysis, [ ]  Permanent dialysis Leg ischemia: [x ] No, [ ]  Yes, no Surgery needed, [ ]  Yes, Surgery needed, [ ]  Amputation Bowel ischemia: [ x] No, [ ]  Medical Rx, [ ]  Surgical Rx Wound complication: [x ] No, [ ]  Superficial separation/infection, [ ]  Return to OR Return to OR: No  Return to OR for bleeding: No Stroke: [x ] None, [ ]  Minor, [ ]  Major  Discharge medications: Statin use:  Yes ASA use:  Yes Plavix use:  No  for medical reason   Beta blocker use: No  for medical reason

## 2014-10-31 NOTE — Telephone Encounter (Signed)
Phone call from pt.  Stated she needs a refill on her pain medication.  Reported she thinks the abdominal incision looks good; reported the scab is loosening from the left groin.  Denies redness or drainage.  Denies fever/ chills.  Reported her pain level reaches 9/10 when the pain medication wears off, and after the pain medication takes effect, her pain level goes below a "5".  Advised will discuss with Dr. Edilia Boickson.  Rec'd approval for Oxycodone Acetaminophen 5/325 1 tablet q 6 hrs/ prn/pain; #30; no refills.  Notified pt. that Rx will be available to pick up at the office.

## 2014-11-01 ENCOUNTER — Encounter (HOSPITAL_COMMUNITY): Payer: Self-pay | Admitting: Vascular Surgery

## 2014-11-04 ENCOUNTER — Other Ambulatory Visit: Payer: Self-pay | Admitting: Family Medicine

## 2014-11-05 ENCOUNTER — Telehealth: Payer: Self-pay

## 2014-11-05 DIAGNOSIS — G8918 Other acute postprocedural pain: Secondary | ICD-10-CM

## 2014-11-05 MED ORDER — OXYCODONE-ACETAMINOPHEN 5-325 MG PO TABS: 1.0000 | ORAL_TABLET | Freq: Four times a day (QID) | ORAL | Status: DC | PRN

## 2014-11-05 NOTE — Telephone Encounter (Signed)
phone call from pt.  Requesting to get a refill on her Percocet.  Reported she had a "rough night".  Reported she is taking the Percocet 1 tab about q 3-4 hrs prn, and will repeat 1 tab. After an hour, if her pain hasn't eased-up.  Stated her pain level prior to medication is 6-7/10.  Denies any redness, warmth, drainage from her abdominal incision.  Stated her husband has looked at the incisions from the abdomen to the groin, and reported they look good.  Stated her right groin seems to be the area of the most discomfort.  Discussed pt's pain with Dr Myra GianottiBrabham.  Advised may give refill on pain medication, but to bring pt. In to office to see Dr. Edilia Boickson on Wednesday.

## 2014-11-05 NOTE — Telephone Encounter (Signed)
Refill appropriate and filled per protocol. 

## 2014-11-05 NOTE — Telephone Encounter (Signed)
Pt. notified of appt. scheduled to f/u with Dr. Edilia Boickson on Wed., 11/07/14 @ 3:45 PM.  Also advised that a prescription for pain medication has been prepared, and can be picked up at the office.  Verb. Understanding.  Agrees with plan.

## 2014-11-06 ENCOUNTER — Encounter: Payer: Self-pay | Admitting: Vascular Surgery

## 2014-11-07 ENCOUNTER — Ambulatory Visit: Payer: Medicare PPO | Admitting: Vascular Surgery

## 2014-11-07 ENCOUNTER — Other Ambulatory Visit: Payer: Self-pay | Admitting: Family Medicine

## 2014-11-07 NOTE — Telephone Encounter (Signed)
LRF 9/24 #90 + 2  LOV 06/05/14  OK refill?

## 2014-11-08 ENCOUNTER — Encounter: Payer: Self-pay | Admitting: Vascular Surgery

## 2014-11-08 NOTE — Telephone Encounter (Signed)
ok 

## 2014-11-08 NOTE — Telephone Encounter (Signed)
rx sent

## 2014-11-09 ENCOUNTER — Ambulatory Visit (INDEPENDENT_AMBULATORY_CARE_PROVIDER_SITE_OTHER): Payer: Self-pay | Admitting: Vascular Surgery

## 2014-11-09 ENCOUNTER — Encounter: Payer: Self-pay | Admitting: Vascular Surgery

## 2014-11-09 VITALS — BP 135/59 | HR 107 | Temp 98.1°F | Resp 16 | Ht <= 58 in | Wt 130.7 lb

## 2014-11-09 DIAGNOSIS — Z48812 Encounter for surgical aftercare following surgery on the circulatory system: Secondary | ICD-10-CM

## 2014-11-09 MED ORDER — TRAMADOL HCL 50 MG PO TABS: 50.0000 mg | ORAL_TABLET | Freq: Four times a day (QID) | ORAL | Status: DC | PRN

## 2014-11-09 NOTE — Progress Notes (Signed)
   Patient name: Felicia DixonCarole D [name] MRN: [id number] DOB: 12/08/1965 Sex: female  REASON FOR VISIT: Follow up after aortobifemoral bypass  HPI: Felicia [name] is a 48 y.o. female who underwent an aortobifemoral bypass with a 12 mm x 7 mm Dacron graft on 10/23/2014. She was having some right-sided abdominal pain and comes in for a follow up visit. She denies significant nausea or vomiting. She has been resuming her normal activities and her symptoms in her leg have essentially resolved. She denies fever or chills.  REVIEW OF SYSTEMS: [name][X ] denotes positive finding; [  ] denotes negative finding  CARDIOVASCULAR:  [ ]  chest pain   [ ]  dyspnea on exertion    CONSTITUTIONAL:  [ ]  fever   [ ]  chills  PHYSICAL EXAM: Filed Vitals:   11/09/14 1556  BP: 135/59  Pulse: 107  Temp: 98.1 F (36.7 C)  TempSrc: Oral  Resp: 16  Height: 4\' 9"  (1.448 m)  Weight: 130 lb 11.2 oz (59.285 kg)  SpO2: 99%   Body mass index is 28.28 kg/(m^2). GENERAL: The patient is a well-nourished female, in no acute distress. The vital signs are documented above. CARDIOVASCULAR: There is a regular rate and rhythm. PULMONARY: There is good air exchange bilaterally without wheezing or rales. Her incisions are all healing nicely. He has palpable pedal pulses.  MEDICAL ISSUES: The patient is doing well status post aortobifemoral bypass graft. She'll return for routine follow up visit in mid January. I have written her a prescription for 30 g for pain as needed. She'll call sooner if she has problems.  DICKSON,CHRISTOPHER S [facility]per: [phone]

## 2014-11-15 ENCOUNTER — Other Ambulatory Visit: Payer: Self-pay | Admitting: Vascular Surgery

## 2014-11-17 ENCOUNTER — Other Ambulatory Visit: Payer: Self-pay | Admitting: Family Medicine

## 2014-11-21 ENCOUNTER — Encounter: Payer: Medicare PPO | Admitting: Vascular Surgery

## 2014-11-26 ENCOUNTER — Telehealth: Payer: Self-pay

## 2014-11-26 ENCOUNTER — Other Ambulatory Visit: Payer: Self-pay | Admitting: Vascular Surgery

## 2014-11-26 NOTE — Telephone Encounter (Signed)
Pt. called and reported she "has pain below the umbilicus, in the bladder area, and in the mid back."  Reported when she needs to go to the BR, she feels discomfort in the bladder.  Stated she has noticed more frequency, and is urinating in smaller quantities. Denies any fever or chills.  Requested Tramadol refill.  Advised with current symptoms, to contact her PCP for further evaluation for poss. UTI.  Verb. understanding.  Advised will not order further pain medication at this time.

## 2014-11-28 ENCOUNTER — Ambulatory Visit (INDEPENDENT_AMBULATORY_CARE_PROVIDER_SITE_OTHER): Payer: Medicare PPO | Admitting: Family Medicine

## 2014-11-28 ENCOUNTER — Encounter: Payer: Self-pay | Admitting: Family Medicine

## 2014-11-28 VITALS — BP 132/76 | HR 78 | Temp 98.0°F | Resp 16 | Ht <= 58 in | Wt 130.0 lb

## 2014-11-28 DIAGNOSIS — N3001 Acute cystitis with hematuria: Secondary | ICD-10-CM

## 2014-11-28 DIAGNOSIS — R300 Dysuria: Secondary | ICD-10-CM

## 2014-11-28 LAB — URINALYSIS, ROUTINE W REFLEX MICROSCOPIC
Bilirubin Urine: NEGATIVE
Glucose, UA: NEGATIVE mg/dL
Ketones, ur: NEGATIVE mg/dL
Leukocytes, UA: NEGATIVE
NITRITE: NEGATIVE
PROTEIN: NEGATIVE mg/dL
Urobilinogen, UA: 0.2 mg/dL (ref 0.0–1.0)
pH: 5.5 (ref 5.0–8.0)

## 2014-11-28 LAB — URINALYSIS, MICROSCOPIC ONLY
Casts: NONE SEEN
Crystals: NONE SEEN

## 2014-11-28 MED ORDER — CIPROFLOXACIN HCL 500 MG PO TABS: 500.0000 mg | ORAL_TABLET | Freq: Two times a day (BID) | ORAL | Status: DC

## 2014-11-28 MED ORDER — PHENAZOPYRIDINE HCL 100 MG PO TABS: 100.0000 mg | ORAL_TABLET | Freq: Three times a day (TID) | ORAL | Status: DC | PRN

## 2014-11-28 MED ORDER — TRAMADOL HCL 50 MG PO TABS: ORAL_TABLET | ORAL | Status: DC

## 2014-11-28 NOTE — Progress Notes (Signed)
Patient ID: Felicia [name], female   DOB: 04/01/1966, 49 y.o.   MRN: [id number]   Subjective:    Patient ID: Felicia [name], female    DOB: 10/19/1966, 49 y.o.   MRN: [id number]  Patient presents for UTI patient here with dysuria as well as follow-up odor or urinary frequency and dribbling for the past 2 weeks. She is status post surgery she had aortic bifemoral bypass surgery back in December. She had a catheter for a few days. She typically does not get urinary tract symptoms. She's not had any fever also has pressure when she urinates. She is not noticing any blood in the urine. Her bowels are moving well.    Review Of Systems:  GEN- denies fatigue, fever, weight loss,weakness, recent illness HEENT- denies eye drainage, change in vision, nasal discharge, CVS- denies chest pain, palpitations RESP- denies SOB, cough, wheeze ABD- denies N/V, change in stools, abd pain GU- +dysuria, denies  hematuria, dribbling, incontinence MSK- denies joint pain, muscle aches, injury Neuro- denies headache, dizziness, syncope, seizure activity       Objective:    BP 132/76 mmHg  Pulse 78  Temp(Src) 98 F (36.7 C) (Oral)  Resp 16  Ht 4\' 9"  (1.448 m)  Wt 130 lb (58.968 kg)  BMI 28.12 kg/m2  LMP 12/26/2012 GEN- NAD, alert and oriented x3 CVS- RRR, no murmur RESP-CTAB ABD-NABS,soft, mild suprapubic tenderness, no rebound, no guarding, no CVA tenderness, midline scar  D/c/i EXT- No edema Pulses- Radial 2+        Assessment & Plan:      Problem List Items Addressed This Visit    None    Visit Diagnoses    Dysuria    -  Primary    Relevant Orders       Urinalysis, Routine w reflex microscopic (Completed)       Urine culture    Acute cystitis with hematuria        UA shows hematura, few white cells, will send for culture, based on symptoms, recent catheter will start Cipro and pyridium       Note: This dictation was prepared with Dragon dictation along with smaller phrase  technology. Any transcriptional errors that result from this process are unintentional.

## 2014-11-28 NOTE — Patient Instructions (Signed)
Start antibiotics, pyridium Drink plenty of water and cranberry juice F/U as needed

## 2014-11-30 LAB — URINE CULTURE: Colony Count: >=100000

## 2014-12-06 ENCOUNTER — Encounter (HOSPITAL_COMMUNITY): Payer: Self-pay | Admitting: Vascular Surgery

## 2014-12-11 ENCOUNTER — Encounter: Payer: Self-pay | Admitting: Vascular Surgery

## 2014-12-11 LAB — PULMONARY FUNCTION TEST

## 2014-12-12 ENCOUNTER — Ambulatory Visit (INDEPENDENT_AMBULATORY_CARE_PROVIDER_SITE_OTHER): Payer: Self-pay | Admitting: Vascular Surgery

## 2014-12-12 ENCOUNTER — Encounter: Payer: Self-pay | Admitting: Vascular Surgery

## 2014-12-12 VITALS — BP 142/54 | HR 107 | Ht <= 58 in | Wt 130.0 lb

## 2014-12-12 DIAGNOSIS — Z48812 Encounter for surgical aftercare following surgery on the circulatory system: Secondary | ICD-10-CM

## 2014-12-12 NOTE — Progress Notes (Signed)
POST OPERATIVE OFFICE NOTE    CC:  F/u for surgery  HPI:  This is a 49 y.o. female who is s/p aortiobifemoral bypass.  She is able to walk up to 5 miles per day without complaints of leg pain.  Her incisions have healed well.  She reports no N/V.    Allergies  Allergen Reactions  . Bee Pollen Swelling  . Cephalosporins Hives  . Codeine Nausea Only  . Doxycycline Hives  . Tetracyclines & Related Hives  . Reflex Pain Relief [Menthol] Rash    Muscle Rub    Current Outpatient Prescriptions  Medication Sig Dispense Refill  . albuterol (PROVENTIL HFA;VENTOLIN HFA) 108 (90 BASE) MCG/ACT inhaler Inhale 2 puffs into the lungs every 6 (six) hours as needed. For shortness of breath    . albuterol (PROVENTIL) (2.5 MG/3ML) 0.083% nebulizer solution Take 2.5 mg by nebulization every 6 (six) hours as needed. For shortness of breath    . aspirin 81 MG tablet Take 81 mg by mouth daily.    . clonazePAM (KLONOPIN) 1 MG tablet Take 1 tablet (1 mg total) by mouth 3 (three) times daily as needed. 90 tablet 2  . cyclobenzaprine (FLEXERIL) 10 MG tablet TAKE 1 TABLET BY MOUTH 3 TIMES A DAY AS NEEDED FOR MUSCLE SPASMS 90 tablet 2  . diphenhydrAMINE (BENADRYL) 50 MG capsule Take 50 mg by mouth every 6 (six) hours as needed for itching.    . divalproex (DEPAKOTE ER) 500 MG 24 hr tablet TAKE 2 TABLETS BY MOUTH EVERY DAY AT BEDTIME 180 tablet 1  . EPINEPHrine (EPI-PEN) 0.3 mg/0.3 mL SOAJ injection Inject 0.3 mLs (0.3 mg total) into the muscle once. 1 Device 2  . ibuprofen (ADVIL,MOTRIN) 200 MG tablet Take 200 mg by mouth every 6 (six) hours as needed (for pain).     . Multiple Vitamin (MULTIVITAMIN) tablet Take 1 tablet by mouth daily.    . phenazopyridine (PYRIDIUM) 100 MG tablet Take 1 tablet (100 mg total) by mouth 3 (three) times daily as needed for pain. 10 tablet 0  . simvastatin (ZOCOR) 20 MG tablet TAKE 1 TABLET (20 MG TOTAL) BY MOUTH AT BEDTIME. 30 tablet 3  . traMADol (ULTRAM) 50 MG tablet TAKE 1  TABLET BY MOUTH EVERY6 HOURS AS NEEED 45 tablet 1  . ciprofloxacin (CIPRO) 500 MG tablet Take 1 tablet (500 mg total) by mouth 2 (two) times daily. (Patient not taking: Reported on 12/12/2014) 10 tablet 0  . oxyCODONE-acetaminophen (PERCOCET/ROXICET) 5-325 MG per tablet Take 1 tablet by mouth every 6 (six) hours as needed for moderate pain. (Patient not taking: Reported on 12/12/2014) 30 tablet 0   No current facility-administered medications for this visit.     ROS:  See HPI  Physical Exam:  Filed Vitals:   12/12/14 1212  BP: 142/54  Pulse: 107    Incision:  Well healed Extremities:  Motor and sensation intact with palpable DP pulses 2+ bilaterally Heart RRR Lungs CTA bilaterally Abdomin soft with positive BS  Assessment/Plan:  This is a 49 y.o. female who is s/p: Aortobifemoral bypass.   She will continue activities as tolerates. If she has a dental or colonoscopy type procedure she needs to inform the physician that she has a graft and will need to be pre medicated with antibiotics.  Agree with above. The patient is very pleased with her surgery and is able to walk now without any difficulty. She is no longer smoking. I'll see her back in one year with  follow up in ABIs. She knows to call sooner if she has problems.  Waverly Ferrari, MD, FACS Beeper [phone] Office: [phone]  Clinton Gallant [facility] PA-C [facility] [phone]  Clinic MD:  Pt seen and examined with Dr. Edilia Bo

## 2014-12-12 NOTE — Addendum Note (Signed)
Addended by: Sharee PimpleMCCHESNEY, Ambera Fedele K on: 12/12/2014 03:42 PM   Modules accepted: Orders

## 2014-12-24 LAB — HM MAMMOGRAPHY

## 2014-12-25 ENCOUNTER — Telehealth: Payer: Self-pay | Admitting: Family Medicine

## 2014-12-25 ENCOUNTER — Encounter: Payer: Self-pay | Admitting: Family Medicine

## 2014-12-25 ENCOUNTER — Ambulatory Visit (INDEPENDENT_AMBULATORY_CARE_PROVIDER_SITE_OTHER): Payer: Medicare PPO | Admitting: Family Medicine

## 2014-12-25 VITALS — BP 130/76 | HR 78 | Temp 99.4°F | Resp 14 | Ht <= 58 in | Wt 130.0 lb

## 2014-12-25 DIAGNOSIS — J209 Acute bronchitis, unspecified: Secondary | ICD-10-CM

## 2014-12-25 DIAGNOSIS — R300 Dysuria: Secondary | ICD-10-CM

## 2014-12-25 DIAGNOSIS — N3001 Acute cystitis with hematuria: Secondary | ICD-10-CM

## 2014-12-25 LAB — URINALYSIS, MICROSCOPIC ONLY
CASTS: NONE SEEN
Crystals: NONE SEEN
Squamous Epithelial / LPF: NONE SEEN

## 2014-12-25 LAB — URINALYSIS, ROUTINE W REFLEX MICROSCOPIC
BILIRUBIN URINE: NEGATIVE
Glucose, UA: NEGATIVE mg/dL
Ketones, ur: NEGATIVE mg/dL
LEUKOCYTES UA: NEGATIVE
NITRITE: NEGATIVE
PROTEIN: NEGATIVE mg/dL
Specific Gravity, Urine: <1.005 — ABNORMAL LOW (ref 1.005–1.030)
Urobilinogen, UA: 0.2 mg/dL (ref 0.0–1.0)
pH: 6 (ref 5.0–8.0)

## 2014-12-25 MED ORDER — TRAMADOL HCL 50 MG PO TABS: ORAL_TABLET | ORAL | Status: DC

## 2014-12-25 MED ORDER — PREDNISONE 20 MG PO TABS: 40.0000 mg | ORAL_TABLET | Freq: Every day | ORAL | Status: DC

## 2014-12-25 MED ORDER — LEVOFLOXACIN 500 MG PO TABS: 500.0000 mg | ORAL_TABLET | Freq: Every day | ORAL | Status: DC

## 2014-12-25 NOTE — Telephone Encounter (Signed)
Patient seen in office today with another patient.   Added to MD schedule.

## 2014-12-25 NOTE — Telephone Encounter (Signed)
[phone] Patient is calling to speak with you regarding her uti

## 2014-12-25 NOTE — Telephone Encounter (Signed)
Call placed to patient. LMTRC.  

## 2014-12-25 NOTE — Patient Instructions (Signed)
Take the prednisone Ultram refilled Take antibiotics F/U as needed

## 2014-12-25 NOTE — Progress Notes (Signed)
Patient ID: Felicia DixonCarole D Kitt, female   DOB: 02/11/1966, 49 y.o.   MRN: [id number]   Subjective:    Patient ID: Felicia [name], female    DOB: 05/16/1966, 49 y.o.   MRN: [id number]  Patient presents for UTI and Medication Refill  patient here for acute visit. She's had some burning with urination as well as some lower flank pain and pelvic pressure for the past 4-5 days. When she had the symptoms last month she had Escherichia coli UTI which I treated with Cipro. She has not noticed any hematuria but she has had urinary frequency.  She's also had cough and congestion with low-grade fever she has history of asthma she has had some wheezing and tightness of the chest as well. She is bringing up a good amount of phlegm this is been going on for the past week or 2.   Request refill on pain meds  Review Of Systems:  GEN- denies fatigue, fever, weight loss,weakness, recent illness HEENT- denies eye drainage, change in vision, nasal discharge, CVS- denies chest pain, palpitations RESP- denies SOB, +cough, +wheeze ABD- denies N/V, change in stools, abd pain GU- denies dysuria, hematuria, dribbling, incontinence MSK- denies joint pain, muscle aches, injury Neuro- denies headache, dizziness, syncope, seizure activity       Objective:    BP 130/76 mmHg  Pulse 78  Temp(Src) 99.4 F (37.4 C) (Oral)  Resp 14  Ht 4\' 9"  (1.448 m)  Wt 130 lb (58.968 kg)  BMI 28.12 kg/m2  LMP 12/26/2012 GEN- NAD, alert and oriented x3 HEENT- PERRL, EOMI, non injected sclera, pink conjunctiva, MMM, oropharynx mild injection, TM clear bilat no effusion,  No  maxillary sinus tenderness,+  Nasal drainage  Neck- Supple, no LAD CVS- RRR, 3/6 SEM RESP-scattered bilat wheeze, +rhonchi, normal WOB, no rales ABD-NABS,soft, mild suprapubic tenderness, no CVA tenderness EXT- No edema Pulses- Radial 2+          Assessment & Plan:      Problem List Items Addressed This Visit    None    Visit Diagnoses    Dysuria    -  Primary    Relevant Orders    Urinalysis, Routine w reflex microscopic (Completed)    Urine culture    Acute bronchitis, unspecified organism        underlying asthma, now with bronchitis,prednisone burst, Levaquin to cover chest and UTI, mucinex DM    Acute cystitis with hematuria           Note: This dictation was prepared with Dragon dictation along with smaller phrase technology. Any transcriptional errors that result from this process are unintentional.

## 2014-12-27 LAB — URINE CULTURE

## 2015-01-16 ENCOUNTER — Telehealth: Payer: Self-pay | Admitting: Family Medicine

## 2015-01-16 NOTE — Telephone Encounter (Signed)
Patient calling to say her uti has come back and would like advice to what to do  [phone]

## 2015-01-16 NOTE — Telephone Encounter (Signed)
Patient needs to schedule OV with a provider.

## 2015-01-18 ENCOUNTER — Telehealth: Payer: Self-pay | Admitting: Family Medicine

## 2015-01-18 MED ORDER — SULFAMETHOXAZOLE-TRIMETHOPRIM 800-160 MG PO TABS: 1.0000 | ORAL_TABLET | Freq: Two times a day (BID) | ORAL | Status: DC

## 2015-01-18 NOTE — Telephone Encounter (Signed)
Noted miscommunication with patient from front desk. Unable to get appt in due to this. Will cover with Bactrim BID x 5 days Pt to keep appt for next week She may need urology appt

## 2015-01-18 NOTE — Telephone Encounter (Signed)
[phone]  PT is needing a refill on her traMADol (ULTRAM) 50 MG tablet  She is also wanting to speak to you about her UTI (I seen previous message saying she needs to be seen) she has an apt on Tuesday 3/1 to see dr pickard for a hosp f/u from a surgery. But she insist on speaking with you about her UTI

## 2015-01-18 NOTE — Telephone Encounter (Signed)
Call placed to patient and patient made aware.  

## 2015-01-18 NOTE — Telephone Encounter (Signed)
Patient called earlier in week. Receptionist was advised that patient would need appointment. Appointment scheduled on 01/22/2015.  Patient states that she has increased urgency, foul odor and slight burning. Requesting MD to advise.   Also requesting refill on tramadol. Advised that prescription was called in on 12/25/2014 with #1 additional refill. Refill will be able to be filled on 01/22/2015.

## 2015-01-22 ENCOUNTER — Ambulatory Visit (INDEPENDENT_AMBULATORY_CARE_PROVIDER_SITE_OTHER): Payer: Medicare PPO | Admitting: Family Medicine

## 2015-01-22 ENCOUNTER — Encounter: Payer: Self-pay | Admitting: Family Medicine

## 2015-01-22 VITALS — BP 160/82 | HR 100 | Temp 98.6°F | Resp 18 | Ht <= 58 in | Wt 129.0 lb

## 2015-01-22 DIAGNOSIS — M7501 Adhesive capsulitis of right shoulder: Secondary | ICD-10-CM | POA: Diagnosis not present

## 2015-01-22 DIAGNOSIS — F329 Major depressive disorder, single episode, unspecified: Secondary | ICD-10-CM

## 2015-01-22 DIAGNOSIS — J441 Chronic obstructive pulmonary disease with (acute) exacerbation: Secondary | ICD-10-CM | POA: Diagnosis not present

## 2015-01-22 DIAGNOSIS — I739 Peripheral vascular disease, unspecified: Secondary | ICD-10-CM

## 2015-01-22 DIAGNOSIS — E785 Hyperlipidemia, unspecified: Secondary | ICD-10-CM

## 2015-01-22 DIAGNOSIS — F32A Depression, unspecified: Secondary | ICD-10-CM

## 2015-01-22 MED ORDER — CYCLOBENZAPRINE HCL 10 MG PO TABS: ORAL_TABLET | ORAL | Status: DC

## 2015-01-22 MED ORDER — ALBUTEROL SULFATE HFA 108 (90 BASE) MCG/ACT IN AERS: 2.0000 | INHALATION_SPRAY | Freq: Four times a day (QID) | RESPIRATORY_TRACT | Status: DC | PRN

## 2015-01-22 MED ORDER — CLONAZEPAM 1 MG PO TABS: 1.0000 mg | ORAL_TABLET | Freq: Three times a day (TID) | ORAL | Status: DC | PRN

## 2015-01-22 MED ORDER — TRAMADOL HCL 50 MG PO TABS: ORAL_TABLET | ORAL | Status: DC

## 2015-01-22 MED ORDER — SIMVASTATIN 20 MG PO TABS: ORAL_TABLET | ORAL | Status: DC

## 2015-01-22 NOTE — Progress Notes (Signed)
Subjective:    Patient ID: Felicia [name], female    DOB: 03/06/1966, 49 y.o.   MRN: [id number]  HPI  patient is here today for follow-up of her multiple medical problems. She has a history of atherosclerosis and peripheral vascular disease. She recently was admitted to the hospital and underwent  Aortobifemoral bypass. Since that time her claudication is dramatically improved. Her exercise stamina has improved. She denies any chest pain.   She denies any right upper quadrant pain or myalgias on simvastatin. She is overdue for fasting lab work. Her blood pressure is extremely elevated today. She states that it is due to stress. Certainly given her history of ASCVD however I would treat this blood pressure if confirmed. Patient has no way of checking her blood pressure at home. She also reports increased wheezing. She is using albuterol almost on a daily basis to help control her wheezing. She's been told that she had asthma ever since she was a child. She also has a 40-pack-year history of smoking. Pulmonary function tests were obtained today on exam revealing an FEV1 to FVC ratio of 66% with an FEV1 of 1.72 L which is 84% of predicted consistent with mild obstruction or stage I COPD.   She also has a questionable history of bipolar disorder versus atypical depression and anxiety. Patient is currently taking Klonopin 1 mg 3 times a day for to control her anxiety. Patient independently discontinue Depakote several months ago. Since stopping the medication she reports less sedation, improved mental clarity. She denies any mania or insomnia. She denies any racing thoughts. She denies any agitation or mood swings. She does not want to resume the medication at the present time. Unfortunately she continues to complain of pain in her right shoulder. She has a history of a frozen shoulder. She is currently using tramadol as needed for the shoulder pain to help her rest at night. She also takes it for occasional low  back pain. Past Medical History  Diagnosis Date  . Asthma   . PUD (peptic ulcer disease)   . Anxiety   . Bipolar 1 disorder   . Insomnia   . Hyperlipidemia   . Anemia   . Atrial fibrillation   . Peripheral arterial disease   . Heart murmur   . Shortness of breath dyspnea   . Pneumonia   . Depression   . Headache   . Arthritis    Past Surgical History  Procedure Laterality Date  . Shoulder surgery    . Nose surgery    . Cesarean section    . Nose surgery    . Back surgery    . Tonsillectomy    . Aorta - bilateral femoral artery bypass graft N/A 10/23/2014    Procedure: AORTOBIFEMORAL BYPASS GRAFT;  Surgeon: Chuck Hinthristopher S Dickson, MD;  [facility];  Service: Vascular;  Laterality: N/A;  . Abdominal aortagram N/A 09/12/2012    Procedure: ABDOMINAL Ronny FlurryAORTAGRAM;  Surgeon: Fransisco HertzBrian L Chen, MD;  [facility];  Service: Cardiovascular;  Laterality: N/A;   Current Outpatient Prescriptions on File Prior to Visit  Medication Sig Dispense Refill  . albuterol (PROVENTIL) (2.5 MG/3ML) 0.083% nebulizer solution Take 2.5 mg by nebulization every 6 (six) hours as needed. For shortness of breath    . aspirin 81 MG tablet Take 81 mg by mouth daily.    . diphenhydrAMINE (BENADRYL) 50 MG capsule Take 50 mg by mouth every 6 (six) hours as needed for itching.    .Marland Kitchen  EPINEPHrine (EPI-PEN) 0.3 mg/0.3 mL SOAJ injection Inject 0.3 mLs (0.3 mg total) into the muscle once. 1 Device 2  . ibuprofen (ADVIL,MOTRIN) 200 MG tablet Take 200 mg by mouth every 6 (six) hours as needed (for pain).     . Multiple Vitamin (MULTIVITAMIN) tablet Take 1 tablet by mouth daily.    . phenazopyridine (PYRIDIUM) 100 MG tablet Take 1 tablet (100 mg total) by mouth 3 (three) times daily as needed for pain. 10 tablet 0  . sulfamethoxazole-trimethoprim (BACTRIM DS,SEPTRA DS) 800-160 MG per tablet Take 1 tablet by mouth 2 (two) times daily. 10 tablet 0   No current facility-administered medications on file prior to visit.     Allergies  Allergen Reactions  . Bee Pollen Swelling  . Cephalosporins Hives  . Codeine Nausea Only  . Doxycycline Hives  . Tetracyclines & Related Hives  . Reflex Pain Relief [Menthol] Rash    Muscle Rub   History   Social History  . Marital Status: Married    Spouse Name: N/A  . Number of Children: N/A  . Years of Education: N/A   Occupational History  . Not on file.   Social History Main Topics  . Smoking status: Former Smoker -- 0.25 packs/day for 30 years    Types: Cigarettes    Quit date: 10/19/2014  . Smokeless tobacco: Never Used     Comment: pt states that she is dealing with some stressful events in her life and as a result has began smoking about 3-4 cigs per day  . Alcohol Use: 0.6 oz/week    1 Shots of liquor, 0 Standard drinks or equivalent per week     Comment: occ   . Drug Use: No  . Sexual Activity: Not on file   Other Topics Concern  . Not on file   Social History Narrative      Review of Systems  All other systems reviewed and are negative.      Objective:   Physical Exam  Constitutional: She appears well-developed and well-nourished.  Neck: Neck supple. No JVD present. No tracheal deviation present. No thyromegaly present.  Cardiovascular: Normal rate, regular rhythm and normal heart sounds.   No murmur heard. Pulmonary/Chest: Effort normal. No respiratory distress. She has wheezes.  Abdominal: Soft. Bowel sounds are normal.  Musculoskeletal: She exhibits no edema.       Right shoulder: She exhibits decreased range of motion, tenderness and pain.  Vitals reviewed.         Assessment & Plan:  Frozen shoulder, right  Bronchitis, chronic obstructive, with exacerbation  Depression  HLD (hyperlipidemia) - Plan: COMPLETE METABOLIC PANEL WITH GFR, Lipid panel, CBC with Differential/Platelet  PVD (peripheral vascular disease)   I will refill her tramadol 50 mg to be used every 6 hours as needed for severe pain in her shoulder.  I gave her 45 tablets. I would like this to last 2-3 months. I believe the patient has COPD complicating her asthma. I have recommended Symbicort 160/4.5 , 2 puffs inhaled twice a day as maintenance therapy to see if we can improve her wheezing. I believe the patient's depression and anxiety are currently well controlled. I do not know that the patient requires a mood stabilizer. Therefore,  I am okay with monitoring her off the Depakote. If she becomes more irritable or has increased mood swings or racing thoughts are reported to put her back on a mood stabilizer. At the present time we will use Klonopin as needed  to help manage her anxiety. I've asked the patient to return fasting for a CBC, CMP, fasting lipid panel. Goal LDL cholesterol is less than 70. She will return tomorrow to check this. Her blood pressure concerns me greatly. I have asked the patient to come in tomorrow and have her blood pressure checked when she has her labs drawn. I would also like to check it at a random time over the next week. If her blood pressures consistently greater than 140/90 , I will start the patient on Cozaar 50 mg by mouth daily.

## 2015-01-22 NOTE — Telephone Encounter (Signed)
See phone note on 01/18/2015 patient was contacted

## 2015-01-25 ENCOUNTER — Ambulatory Visit: Payer: Medicare PPO | Admitting: *Deleted

## 2015-01-25 ENCOUNTER — Telehealth: Payer: Self-pay | Admitting: *Deleted

## 2015-01-25 DIAGNOSIS — I10 Essential (primary) hypertension: Secondary | ICD-10-CM

## 2015-01-25 NOTE — Progress Notes (Signed)
Patient ID: Felicia [name], female   DOB: 03/11/1966, 49 y.o.   MRN: [id number] Pt came in today to have her blood pressure checked, states that she was here on Tuesday march 1 and it was elevated a bit and was told during office visit to come in this week to have checked again.. Today her Blood pressure is 130/80

## 2015-01-25 NOTE — Telephone Encounter (Signed)
Pt came in today to have her blood pressure checked, states that she was here on Tuesday march 1 and it was elevated a bit and was told during office visit to come in this week to have checked again.. Today her Blood pressure is 130/80

## 2015-01-28 NOTE — Telephone Encounter (Signed)
Pt aware via mychart 

## 2015-01-28 NOTE — Telephone Encounter (Signed)
Much better, not meds at this time.  Continue to check regularly.

## 2015-02-08 ENCOUNTER — Ambulatory Visit: Payer: Medicare PPO | Admitting: *Deleted

## 2015-02-08 ENCOUNTER — Telehealth: Payer: Self-pay | Admitting: *Deleted

## 2015-02-08 VITALS — BP 150/88

## 2015-02-08 DIAGNOSIS — I10 Essential (primary) hypertension: Secondary | ICD-10-CM

## 2015-02-08 NOTE — Progress Notes (Signed)
Patient ID: Felicia [name], female   DOB: 12/03/1965, 49 y.o.   MRN: [id number] PT came in today as nurse visit to have her Blood pressure checked and it was 150-88

## 2015-02-08 NOTE — Telephone Encounter (Signed)
PT came in today as nurse visit to have her Blood pressure checked and it was 150-88

## 2015-02-13 ENCOUNTER — Other Ambulatory Visit: Payer: Medicare PPO

## 2015-02-13 DIAGNOSIS — E785 Hyperlipidemia, unspecified: Secondary | ICD-10-CM | POA: Diagnosis not present

## 2015-02-13 LAB — LIPID PANEL
Cholesterol: 185 mg/dL (ref 0–200)
HDL: 38 mg/dL — ABNORMAL LOW (ref >=46)
LDL Cholesterol: 107 mg/dL — ABNORMAL HIGH (ref 0–99)
Total CHOL/HDL Ratio: 4.9 Ratio
Triglycerides: 199 mg/dL — ABNORMAL HIGH (ref <150)
VLDL: 40 mg/dL (ref 0–40)

## 2015-02-13 LAB — COMPLETE METABOLIC PANEL WITH GFR
ALK PHOS: 60 U/L (ref 39–117)
ALT: 14 U/L (ref 0–35)
AST: 14 U/L (ref 0–37)
Albumin: 4 g/dL (ref 3.5–5.2)
BUN: 18 mg/dL (ref 6–23)
CHLORIDE: 106 meq/L (ref 96–112)
CO2: 26 mEq/L (ref 19–32)
Calcium: 9.2 mg/dL (ref 8.4–10.5)
Creat: 0.75 mg/dL (ref 0.50–1.10)
GFR, Est African American: >89 mL/min
GFR, Est Non African American: >89 mL/min
Glucose, Bld: 96 mg/dL (ref 70–99)
POTASSIUM: 4.2 meq/L (ref 3.5–5.3)
SODIUM: 142 meq/L (ref 135–145)
Total Bilirubin: 0.4 mg/dL (ref 0.2–1.2)
Total Protein: 6.7 g/dL (ref 6.0–8.3)

## 2015-02-13 LAB — CBC WITH DIFFERENTIAL/PLATELET
BASOS PCT: 1 % (ref 0–1)
Basophils Absolute: 0.1 10*3/uL (ref 0.0–0.1)
Eosinophils Absolute: 0.3 10*3/uL (ref 0.0–0.7)
Eosinophils Relative: 4 % (ref 0–5)
HCT: 40.7 % (ref 36.0–46.0)
Hemoglobin: 13.3 g/dL (ref 12.0–15.0)
LYMPHS PCT: 40 % (ref 12–46)
Lymphs Abs: 3.2 10*3/uL (ref 0.7–4.0)
MCH: 28.1 pg (ref 26.0–34.0)
MCHC: 32.7 g/dL (ref 30.0–36.0)
MCV: 86 fL (ref 78.0–100.0)
MONO ABS: 0.6 10*3/uL (ref 0.1–1.0)
MPV: 9.5 fL (ref 8.6–12.4)
Monocytes Relative: 7 % (ref 3–12)
NEUTROS ABS: 3.8 10*3/uL (ref 1.7–7.7)
Neutrophils Relative %: 48 % (ref 43–77)
Platelets: 275 10*3/uL (ref 150–400)
RBC: 4.73 MIL/uL (ref 3.87–5.11)
RDW: 15.9 % — ABNORMAL HIGH (ref 11.5–15.5)
WBC: 7.9 10*3/uL (ref 4.0–10.5)

## 2015-02-13 NOTE — Addendum Note (Signed)
Addended by: Reginia FortsATKINS, Tequlia Gonsalves on: 02/13/2015 09:08 AM   Modules accepted: Orders

## 2015-02-14 ENCOUNTER — Telehealth: Payer: Self-pay | Admitting: Family Medicine

## 2015-02-14 NOTE — Telephone Encounter (Signed)
[phone] PT is calling about her lab results

## 2015-02-15 MED ORDER — ROSUVASTATIN CALCIUM 20 MG PO TABS: 20.0000 mg | ORAL_TABLET | Freq: Every day | ORAL | Status: DC

## 2015-02-15 NOTE — Telephone Encounter (Signed)
-----   Message from Donita BrooksWarren T Pickard, MD sent at 02/14/2015  7:04 AM EDT ----- Given PVD, LDL is too high.  Recommend dc zocor and switch to crestor 20 mg poqday and recheck in 3 months.

## 2015-02-15 NOTE — Telephone Encounter (Signed)
Pt aware of lab results and provider recommendations.  Rx to pharmacy and med list updated

## 2015-02-18 ENCOUNTER — Encounter: Payer: Self-pay | Admitting: Family Medicine

## 2015-02-21 ENCOUNTER — Telehealth: Payer: Self-pay | Admitting: Family Medicine

## 2015-02-21 NOTE — Telephone Encounter (Signed)
Patient is calling to ask you questions about the new cholesterol med that she is supposed to be taking  [phone]

## 2015-02-22 NOTE — Telephone Encounter (Signed)
Pt sates that Crestor is too expensive and would like some thing different if possible? Also would like a refill on her Tramadol? ? OK to Refill

## 2015-02-25 MED ORDER — TRAMADOL HCL 50 MG PO TABS: ORAL_TABLET | ORAL | Status: DC

## 2015-02-25 MED ORDER — ATORVASTATIN CALCIUM 80 MG PO TABS: 80.0000 mg | ORAL_TABLET | Freq: Every day | ORAL | Status: DC

## 2015-02-25 NOTE — Telephone Encounter (Signed)
Patient calling back regarding this message  [phone]

## 2015-02-25 NOTE — Telephone Encounter (Signed)
Pt aware and meds called to pharm

## 2015-02-25 NOTE — Telephone Encounter (Signed)
I understand. Discontinue Zocor and Crestor. Replace with Lipitor 80 mg by mouth daily. I am okay with refilling tramadol

## 2015-03-29 DIAGNOSIS — H524 Presbyopia: Secondary | ICD-10-CM | POA: Diagnosis not present

## 2015-03-29 DIAGNOSIS — H5203 Hypermetropia, bilateral: Secondary | ICD-10-CM | POA: Diagnosis not present

## 2015-04-01 ENCOUNTER — Encounter: Payer: Self-pay | Admitting: Family Medicine

## 2015-04-03 ENCOUNTER — Encounter: Payer: Self-pay | Admitting: Family Medicine

## 2015-04-03 ENCOUNTER — Ambulatory Visit (INDEPENDENT_AMBULATORY_CARE_PROVIDER_SITE_OTHER): Payer: Medicare PPO | Admitting: Family Medicine

## 2015-04-03 VITALS — BP 136/70 | HR 72 | Temp 98.4°F | Resp 16 | Ht <= 58 in | Wt 139.0 lb

## 2015-04-03 DIAGNOSIS — N951 Menopausal and female climacteric states: Secondary | ICD-10-CM

## 2015-04-03 DIAGNOSIS — M7501 Adhesive capsulitis of right shoulder: Secondary | ICD-10-CM | POA: Diagnosis not present

## 2015-04-03 MED ORDER — TRAMADOL HCL 50 MG PO TABS: ORAL_TABLET | ORAL | Status: DC

## 2015-04-03 MED ORDER — METHYLPREDNISOLONE 4 MG PO TBPK: ORAL_TABLET | ORAL | Status: DC

## 2015-04-03 NOTE — Progress Notes (Signed)
Patient ID: Felicia [name], female   DOB: 11/12/1966, 49 y.o.   MRN: [id number]   Subjective:    Patient ID: Felicia [name], female    DOB: 12/19/1965, 49 y.o.   MRN: [id number]  Patient presents for R Shoulder Pain and Hot Flashes  Hot flashes worsening over past 2 years, stopped menses 2 years ago, overdue for PAP and Mammogram. Wants to try something for hot flashes   History of chronic right shoulder pain, Frozen shoulder, multiple operations on her shoulder has seen ortho, nothing more can be offered. Out of Ultram.    Review Of Systems:  GEN- denies fatigue, fever, weight loss,weakness, recent illness HEENT- denies eye drainage, change in vision, nasal discharge, CVS- denies chest pain, palpitations RESP- denies SOB, cough, wheeze ABD- denies N/V, change in stools, abd pain GU- denies dysuria, hematuria, dribbling, incontinence MSK- +joint pain, muscle aches, injury Neuro- denies headache, dizziness, syncope, seizure activity       Objective:    BP 136/70 mmHg  Pulse 72  Temp(Src) 98.4 F (36.9 C) (Oral)  Resp 16  Ht 4\' 9"  (1.448 m)  Wt 139 lb (63.05 kg)  BMI 30.07 kg/m2  LMP 12/26/2012 GEN- NAD, alert and oriented x3 MSK- Decreased ROM Right shoulder/ arm, surgical scar well healed, swelling over top of shoulder and over biceps, decreased strength RUE compared to left  EXT- No edema Pulses- Radial - 2+        Assessment & Plan:      Problem List Items Addressed This Visit    None    Visit Diagnoses    Hot flashes, menopausal    -  Primary    Needs GYN exam before any prescribed hormones, she did quit smoking, no history of clots, okay to try black cohosh for now    Frozen shoulder syndrome, right        Chronic problems, acute flares of swelling pain, states she responds to steroids, Given Medrol dosepak and Ultram , has flexeril as well, no other intervention per ortho notes    Relevant Medications    methylPREDNISolone (MEDROL DOSEPAK) 4 MG TBPK  tablet    traMADol (ULTRAM) 50 MG tablet       Note: This dictation was prepared with Dragon dictation along with smaller phrase technology. Any transcriptional errors that result from this process are unintentional.

## 2015-04-03 NOTE — Patient Instructions (Addendum)
Use Black Cohosh for the hot flashes  Ultram for the shoulder pain F/U  For Medicare physical and PAP Smear

## 2015-04-16 ENCOUNTER — Other Ambulatory Visit: Payer: Self-pay | Admitting: Family Medicine

## 2015-04-17 ENCOUNTER — Encounter: Payer: Medicare PPO | Admitting: Family Medicine

## 2015-04-17 NOTE — Telephone Encounter (Signed)
?   OK to Refill  

## 2015-04-18 ENCOUNTER — Other Ambulatory Visit: Payer: Self-pay | Admitting: Family Medicine

## 2015-04-18 NOTE — Telephone Encounter (Signed)
Prescription sent to pharmacy.

## 2015-04-18 NOTE — Telephone Encounter (Signed)
ok 

## 2015-04-18 NOTE — Telephone Encounter (Signed)
Ok to refill 

## 2015-04-18 NOTE — Telephone Encounter (Signed)
rx called in

## 2015-04-19 ENCOUNTER — Other Ambulatory Visit: Payer: Self-pay | Admitting: Family Medicine

## 2015-04-19 NOTE — Telephone Encounter (Signed)
ok 

## 2015-04-19 NOTE — Telephone Encounter (Signed)
rx called in

## 2015-04-19 NOTE — Telephone Encounter (Signed)
Ok to refill??  Last office visit 5/11/216.  Last refill 01/22/2015, #2 refills.

## 2015-05-08 ENCOUNTER — Other Ambulatory Visit: Payer: Self-pay | Admitting: Family Medicine

## 2015-05-08 ENCOUNTER — Encounter: Payer: Self-pay | Admitting: Family Medicine

## 2015-05-08 ENCOUNTER — Ambulatory Visit (INDEPENDENT_AMBULATORY_CARE_PROVIDER_SITE_OTHER): Payer: Medicare PPO | Admitting: Family Medicine

## 2015-05-08 VITALS — BP 140/86 | HR 78 | Temp 98.2°F | Resp 16 | Ht <= 58 in | Wt 135.0 lb

## 2015-05-08 DIAGNOSIS — Z1231 Encounter for screening mammogram for malignant neoplasm of breast: Secondary | ICD-10-CM

## 2015-05-08 DIAGNOSIS — Z01419 Encounter for gynecological examination (general) (routine) without abnormal findings: Secondary | ICD-10-CM | POA: Diagnosis not present

## 2015-05-08 DIAGNOSIS — J4530 Mild persistent asthma, uncomplicated: Secondary | ICD-10-CM | POA: Diagnosis not present

## 2015-05-08 DIAGNOSIS — Z124 Encounter for screening for malignant neoplasm of cervix: Secondary | ICD-10-CM

## 2015-05-08 DIAGNOSIS — E785 Hyperlipidemia, unspecified: Secondary | ICD-10-CM

## 2015-05-08 DIAGNOSIS — S39012A Strain of muscle, fascia and tendon of lower back, initial encounter: Secondary | ICD-10-CM

## 2015-05-08 DIAGNOSIS — J45909 Unspecified asthma, uncomplicated: Secondary | ICD-10-CM | POA: Insufficient documentation

## 2015-05-08 MED ORDER — METHYLPREDNISOLONE 4 MG PO TBPK: ORAL_TABLET | ORAL | Status: DC

## 2015-05-08 NOTE — Progress Notes (Signed)
Patient ID: Felicia [name], female   DOB: 09/04/1966, 49 y.o.   MRN: [id number]   Subjective:    Patient ID: Felicia [name], female    DOB: 28-Jan-1966, 49 y.o.   MRN: [id number]  Patient presents for PAP and Shoulder/ Back Pain  Lance Bosch here for Pap and breast exam. Her last Pap smear was greater than 10 years ago her last mammogram was in 2014. She has not had a colonoscopy. She has had a pneumonia vaccine. She did quit smoking when she had her vascular surgery> 1 year ago.  She denies any vaginal bleeding or vaginal discharge. She does have leaking of urine especially if she coughs or sneezes.  She also complains of her chronic shoulder pain she has tramadol for this she also states that her back pain flared up this weekend she was helping her husband with a boat and she was dragged a few feet trying to hold onto it and she has had spasms in her lower back since then. She denies any change in bowel or bladder denies any new tingling or numbness in her lower extremities.  Asthma- history of chronic asthma. She was also a smoker. She uses albuterol the past week she's had increased use secondary to a cold but she also states that the past couple months she's been using out beer all a few times a week. She was on Advair in the past but does not recall when this was discontinued.  Removed tick from left leg, has red spot, +itching  Review Of Systems:  GEN- denies fatigue, fever, weight loss,weakness, recent illness HEENT- denies eye drainage, change in vision, nasal discharge, CVS- denies chest pain, palpitations RESP- denies SOB, cough, wheeze ABD- denies N/V, change in stools, abd pain GU- denies dysuria, hematuria, dribbling+ urinary, incontinence MSK- + joint pain, +muscle aches, injury Neuro- denies headache, dizziness, syncope, seizure activity       Objective:    BP 140/86 mmHg  Pulse 78  Temp(Src) 98.2 F (36.8 C) (Oral)  Resp 16  Ht  (1.448 m)  Wt 135 lb (61.236 kg)  BMI  29.21 kg/m2  LMP 12/26/2012 GEN- NAD, alert and oriented x3 HEENT- PERRL, EOMI, non injected sclera, pink conjunctiva, MMM, oropharynx clear Neck- Supple, no thyromegaly CVS- RRR, 3/6 SEM RESP-CTAB Breast- normal symmetry, no nipple inversion,no nipple drainage, no nodules or lumps felt Nodes- no axillary nodes ABD-NABS,soft,NT,ND GU- normal external genitalia, vaginal mucosa pink and moist, cervix visualized no growth, no blood form os, no discharge, no CMT, no ovarian masses, uterus normal size, mild bladder prolapse, urethra normal, rectum- normal tone, FOBT neg MSK- TTP right paraspinals, +spasm, decreased ROM spine/Hip, neg SLR, NT over lumbar spine Skin- multiple bug bites, left lower post thigh- bite with scab at center, mil erythema around, no induration, no fluctance EXT- No edema Pulses- Radial - 2+        Assessment & Plan:      Topical cortisone to bug bites, no signs of lyme Problem List Items Addressed This Visit    Low back strain    Chronic low back pain, now with acute flare from incident described above. Given medrol dosepak Has muscle relaxer and ultram which I did not change, no red flags on exam      Hyperlipidemia    Return for fasting labs in 2 weeks  Recent change to medication by PCP       Other Visit Diagnoses    Cervical cancer screening    -  Primary    Relevant Orders    PAP, ThinPrep ASCUS Rflx HPV Rflx Type    Visit for screening mammogram        Relevant Orders    MM DIGITAL SCREENING BILATERAL    Encounter for routine gynecological examination        PAP Smear done, pending results repeat in 3 years, Mammogram due- last in 2014, Colon cancer screening due- pt declined colonoscopy at this time, given stool ca       Note: This dictation was prepared with Dragon dictation along with smaller phrase technology. Any transcriptional errors that result from this process are unintentional.

## 2015-05-08 NOTE — Assessment & Plan Note (Signed)
Chronic low back pain, now with acute flare from incident described above. Given medrol dosepak Has muscle relaxer and ultram which I did not change, no red flags on exam

## 2015-05-08 NOTE — Patient Instructions (Addendum)
Use cortisone on the leg Take prednisone as prescribed for your back We will send letter with PAP Smear results Call and schedule your mammogram F/U with Dr. Tanya Nones in3 months for cholesterol labs F/U  2 weeks for cholesterol labs

## 2015-05-08 NOTE — Assessment & Plan Note (Signed)
Asthma uncontrolled based on use of rescue inhaler Give qvar sample to try for next 4 weeks

## 2015-05-08 NOTE — Assessment & Plan Note (Signed)
Return for fasting labs in 2 weeks  Recent change to medication by PCP

## 2015-05-09 ENCOUNTER — Encounter: Payer: Self-pay | Admitting: *Deleted

## 2015-05-09 ENCOUNTER — Encounter: Payer: Self-pay | Admitting: Family Medicine

## 2015-05-09 LAB — PAP THINPREP ASCUS RFLX HPV RFLX TYPE

## 2015-05-09 NOTE — Telephone Encounter (Signed)
Medication called to pharmacy. 

## 2015-05-09 NOTE — Telephone Encounter (Signed)
Patient saw dr Jeanice Lim yesterday, and called this morning to talk to christina about torodol, would like a call back from sandy regarding this, and the injury to her back and shoulder  [phone]

## 2015-05-09 NOTE — Telephone Encounter (Signed)
Ok to refill??  Last office visit 05/08/2015.  Last refill 04/03/2015, #1 refill.

## 2015-05-09 NOTE — Telephone Encounter (Signed)
ok 

## 2015-05-09 NOTE — Telephone Encounter (Signed)
Of note, patient did fill prescription on 5/11 and 5/27. Patient reports that current prescription is not enough to last 30 days.   Please advise.

## 2015-05-10 NOTE — Telephone Encounter (Signed)
PT has rtn your call [phone]

## 2015-05-10 NOTE — Telephone Encounter (Signed)
LMTRC

## 2015-05-13 ENCOUNTER — Encounter: Payer: Self-pay | Admitting: *Deleted

## 2015-05-20 NOTE — Telephone Encounter (Signed)
This encounter was created in error - please disregard.

## 2015-05-21 ENCOUNTER — Encounter: Payer: Self-pay | Admitting: *Deleted

## 2015-05-28 ENCOUNTER — Ambulatory Visit (HOSPITAL_COMMUNITY): Admission: RE | Admit: 2015-05-28 | Payer: Medicare PPO | Source: Ambulatory Visit

## 2015-05-30 ENCOUNTER — Other Ambulatory Visit: Payer: Self-pay | Admitting: Family Medicine

## 2015-05-30 ENCOUNTER — Other Ambulatory Visit: Payer: Medicare PPO

## 2015-05-30 DIAGNOSIS — Z1211 Encounter for screening for malignant neoplasm of colon: Secondary | ICD-10-CM

## 2015-05-31 ENCOUNTER — Encounter: Payer: Self-pay | Admitting: *Deleted

## 2015-05-31 ENCOUNTER — Other Ambulatory Visit: Payer: Self-pay | Admitting: Family Medicine

## 2015-05-31 DIAGNOSIS — Z1231 Encounter for screening mammogram for malignant neoplasm of breast: Secondary | ICD-10-CM

## 2015-05-31 LAB — FECAL OCCULT BLOOD, IMMUNOCHEMICAL
FECAL OCCULT BLOOD: NEGATIVE
Fecal Occult Blood: NEGATIVE
Fecal Occult Blood: NEGATIVE

## 2015-06-05 ENCOUNTER — Ambulatory Visit (HOSPITAL_COMMUNITY)
Admission: RE | Admit: 2015-06-05 | Discharge: 2015-06-05 | Disposition: A | Discharge status: Home / Self Care | Payer: Medicare PPO | Source: Ambulatory Visit | Attending: Family Medicine | Admitting: Family Medicine

## 2015-06-05 DIAGNOSIS — Z1231 Encounter for screening mammogram for malignant neoplasm of breast: Secondary | ICD-10-CM | POA: Diagnosis not present

## 2015-06-06 ENCOUNTER — Other Ambulatory Visit: Payer: Medicare PPO

## 2015-06-06 DIAGNOSIS — E785 Hyperlipidemia, unspecified: Secondary | ICD-10-CM | POA: Diagnosis not present

## 2015-06-06 LAB — COMPREHENSIVE METABOLIC PANEL
ALK PHOS: 72 U/L (ref 39–117)
ALT: 22 U/L (ref 0–35)
AST: 19 U/L (ref 0–37)
Albumin: 3.8 g/dL (ref 3.5–5.2)
BILIRUBIN TOTAL: 0.6 mg/dL (ref 0.2–1.2)
BUN: 14 mg/dL (ref 6–23)
CHLORIDE: 102 meq/L (ref 96–112)
CO2: 28 mEq/L (ref 19–32)
Calcium: 9 mg/dL (ref 8.4–10.5)
Creat: 0.75 mg/dL (ref 0.50–1.10)
Glucose, Bld: 94 mg/dL (ref 70–99)
POTASSIUM: 3.9 meq/L (ref 3.5–5.3)
SODIUM: 142 meq/L (ref 135–145)
TOTAL PROTEIN: 6.5 g/dL (ref 6.0–8.3)

## 2015-06-06 LAB — CBC WITH DIFFERENTIAL/PLATELET
BASOS PCT: 0 % (ref 0–1)
Basophils Absolute: 0 10*3/uL (ref 0.0–0.1)
Eosinophils Absolute: 0.3 10*3/uL (ref 0.0–0.7)
Eosinophils Relative: 3 % (ref 0–5)
HEMATOCRIT: 41 % (ref 36.0–46.0)
Hemoglobin: 13.7 g/dL (ref 12.0–15.0)
Lymphocytes Relative: 30 % (ref 12–46)
Lymphs Abs: 2.8 10*3/uL (ref 0.7–4.0)
MCH: 30.1 pg (ref 26.0–34.0)
MCHC: 33.4 g/dL (ref 30.0–36.0)
MCV: 90.1 fL (ref 78.0–100.0)
MPV: 9.2 fL (ref 8.6–12.4)
Monocytes Absolute: 0.6 10*3/uL (ref 0.1–1.0)
Monocytes Relative: 6 % (ref 3–12)
NEUTROS ABS: 5.6 10*3/uL (ref 1.7–7.7)
NEUTROS PCT: 61 % (ref 43–77)
Platelets: 286 10*3/uL (ref 150–400)
RBC: 4.55 MIL/uL (ref 3.87–5.11)
RDW: 13.7 % (ref 11.5–15.5)
WBC: 9.2 10*3/uL (ref 4.0–10.5)

## 2015-06-06 LAB — LIPID PANEL
CHOL/HDL RATIO: 4 ratio
CHOLESTEROL: 152 mg/dL (ref 0–200)
HDL: 38 mg/dL — AB (ref >=46)
LDL CALC: 78 mg/dL (ref 0–99)
TRIGLYCERIDES: 178 mg/dL — AB (ref <150)
VLDL: 36 mg/dL (ref 0–40)

## 2015-06-07 ENCOUNTER — Encounter: Payer: Self-pay | Admitting: *Deleted

## 2015-06-27 ENCOUNTER — Encounter: Payer: Self-pay | Admitting: Family Medicine

## 2015-06-27 ENCOUNTER — Ambulatory Visit (INDEPENDENT_AMBULATORY_CARE_PROVIDER_SITE_OTHER): Payer: Medicare PPO | Admitting: Family Medicine

## 2015-06-27 VITALS — BP 150/82 | HR 80 | Temp 98.6°F | Resp 16 | Ht <= 58 in | Wt 133.0 lb

## 2015-06-27 DIAGNOSIS — E785 Hyperlipidemia, unspecified: Secondary | ICD-10-CM

## 2015-06-27 DIAGNOSIS — M25511 Pain in right shoulder: Secondary | ICD-10-CM

## 2015-06-27 DIAGNOSIS — R319 Hematuria, unspecified: Secondary | ICD-10-CM | POA: Diagnosis not present

## 2015-06-27 DIAGNOSIS — I739 Peripheral vascular disease, unspecified: Secondary | ICD-10-CM

## 2015-06-27 DIAGNOSIS — S39012A Strain of muscle, fascia and tendon of lower back, initial encounter: Secondary | ICD-10-CM | POA: Diagnosis not present

## 2015-06-27 LAB — URINALYSIS, ROUTINE W REFLEX MICROSCOPIC
Bilirubin Urine: NEGATIVE
Glucose, UA: NEGATIVE
KETONES UR: NEGATIVE
Leukocytes, UA: NEGATIVE
NITRITE: NEGATIVE
Specific Gravity, Urine: <1.005 (ref 1.001–1.035)
pH: 5.5 (ref 5.0–8.0)

## 2015-06-27 LAB — URINALYSIS, MICROSCOPIC ONLY
CRYSTALS: NONE SEEN [HPF]
Casts: NONE SEEN [LPF]
Yeast: NONE SEEN [HPF]

## 2015-06-27 MED ORDER — TRAMADOL HCL 50 MG PO TABS: ORAL_TABLET | ORAL | Status: DC

## 2015-06-27 MED ORDER — DIVALPROEX SODIUM ER 500 MG PO TB24: 1000.0000 mg | ORAL_TABLET | Freq: Every day | ORAL | Status: DC

## 2015-06-27 MED ORDER — SULFAMETHOXAZOLE-TRIMETHOPRIM 800-160 MG PO TABS: 1.0000 | ORAL_TABLET | Freq: Two times a day (BID) | ORAL | Status: DC

## 2015-06-27 NOTE — Progress Notes (Signed)
Subjective:    Patient ID: Felicia [name], female    DOB: 26-Nov-1965, 49 y.o.   MRN: [id number]  HPI 6 weeks ago, the patient was injured while trying to hold onto a boat at the [facility]. She felt her right posterior shoulder pulled suddenly when the boat violently jerked. She also felt a muscle in her lower back pulled as well as her hamstring pull. She continues to have pain in her hamstring from her gluteus to her upper knee. She also reports pain in her right shoulder with internal and external rotation as well as abduction greater than 90. She also has low back pain. He seemed to respond to Flexeril area and she is requesting a refill on her tramadol so that she can use this for pain. She also reports several days of hematuria. She reports foul-smelling urine. She reports bladder pain and bladder spasms. Urinalysis today reveals blood but no evidence of infection. She also has hyperlipidemia with a history of atherosclerosis. Her most recent lab work is listed below: Office Visit on 06/27/2015  Component Date Value Ref Range Status  . Color, Urine 06/27/2015 YELLOW  YELLOW Final   Comment: ** Please note change in unit of measure and reference range(s). **     . APPearance 06/27/2015 CLOUDY* CLEAR Final  . Specific Gravity, Urine 06/27/2015 <1.005  1.001 - 1.035 Final  . pH 06/27/2015 5.5  5.0 - 8.0 Final  . Glucose, UA 06/27/2015 NEGATIVE  NEGATIVE Final  . Bilirubin Urine 06/27/2015 NEGATIVE  NEGATIVE Final  . Ketones, ur 06/27/2015 NEGATIVE  NEGATIVE Final  . Hgb urine dipstick 06/27/2015 3+* NEGATIVE Final  . Protein, ur 06/27/2015 2+* NEGATIVE Final  . Nitrite 06/27/2015 NEGATIVE  NEGATIVE Final  . Leukocytes, UA 06/27/2015 NEGATIVE  NEGATIVE Final  . WBC, UA 06/27/2015 0-5  <=5 WBC/HPF Final  . RBC / HPF 06/27/2015 3-10* <=2 RBC/HPF Final  . Squamous Epithelial / LPF 06/27/2015 0-5  <=5 HPF Final  . Bacteria, UA 06/27/2015 FEW* NONE SEEN HPF Final  . Crystals 06/27/2015 NONE SEEN   NONE SEEN HPF Final  . Casts 06/27/2015 NONE SEEN  NONE SEEN LPF Final  . Yeast 06/27/2015 NONE SEEN  NONE SEEN HPF Final  Appointment on 06/06/2015  Component Date Value Ref Range Status  . WBC 06/06/2015 9.2  4.0 - 10.5 K/uL Final  . RBC 06/06/2015 4.55  3.87 - 5.11 MIL/uL Final  . Hemoglobin 06/06/2015 13.7  12.0 - 15.0 g/dL Final  . HCT 06/06/2015 41.0  36.0 - 46.0 % Final  . MCV 06/06/2015 90.1  78.0 - 100.0 fL Final  . MCH 06/06/2015 30.1  26.0 - 34.0 pg Final  . MCHC 06/06/2015 33.4  30.0 - 36.0 g/dL Final  . RDW 06/06/2015 13.7  11.5 - 15.5 % Final  . Platelets 06/06/2015 286  150 - 400 K/uL Final  . MPV 06/06/2015 9.2  8.6 - 12.4 fL Final  . Neutrophils Relative % 06/06/2015 61  43 - 77 % Final  . Neutro Abs 06/06/2015 5.6  1.7 - 7.7 K/uL Final  . Lymphocytes Relative 06/06/2015 30  12 - 46 % Final  . Lymphs Abs 06/06/2015 2.8  0.7 - 4.0 K/uL Final  . Monocytes Relative 06/06/2015 6  3 - 12 % Final  . Monocytes Absolute 06/06/2015 0.6  0.1 - 1.0 K/uL Final  . Eosinophils Relative 06/06/2015 3  0 - 5 % Final  . Eosinophils Absolute 06/06/2015 0.3  0.0 - 0.7 K/uL  Final  . Basophils Relative 06/06/2015 0  0 - 1 % Final  . Basophils Absolute 06/06/2015 0.0  0.0 - 0.1 K/uL Final  . Smear Review 06/06/2015 Criteria for review not met   Final  . Sodium 06/06/2015 142  135 - 145 mEq/L Final  . Potassium 06/06/2015 3.9  3.5 - 5.3 mEq/L Final  . Chloride 06/06/2015 102  96 - 112 mEq/L Final  . CO2 06/06/2015 28  19 - 32 mEq/L Final  . Glucose, Bld 06/06/2015 94  70 - 99 mg/dL Final  . BUN 06/06/2015 14  6 - 23 mg/dL Final  . Creat 06/06/2015 0.75  0.50 - 1.10 mg/dL Final  . Total Bilirubin 06/06/2015 0.6  0.2 - 1.2 mg/dL Final  . Alkaline Phosphatase 06/06/2015 72  39 - 117 U/L Final  . AST 06/06/2015 19  0 - 37 U/L Final  . ALT 06/06/2015 22  0 - 35 U/L Final  . Total Protein 06/06/2015 6.5  6.0 - 8.3 g/dL Final  . Albumin 06/06/2015 3.8  3.5 - 5.2 g/dL Final  . Calcium  06/06/2015 9.0  8.4 - 10.5 mg/dL Final  . Cholesterol 06/06/2015 152  0 - 200 mg/dL Final   Comment: ATP III Classification:       < 200        mg/dL        Desirable      200 - 239     mg/dL        Borderline High      >= 240        mg/dL        High     . Triglycerides 06/06/2015 178* <150 mg/dL Final  . HDL 06/06/2015 38* >=46 mg/dL Final  . Total CHOL/HDL Ratio 06/06/2015 4.0   Final  . VLDL 06/06/2015 36  0 - 40 mg/dL Final  . LDL Cholesterol 06/06/2015 78  0 - 99 mg/dL Final   Comment:   Total Cholesterol/HDL Ratio:CHD Risk                        Coronary Heart Disease Risk Table                                        Men       Women          1/2 Average Risk              3.4        3.3              Average Risk              5.0        4.4           2X Average Risk              9.6        7.1           3X Average Risk             23.4       11.0 Use the calculated Patient Ratio above and the CHD Risk table  to determine the patient's CHD Risk. ATP III Classification (LDL):       < 100        mg/dL  Optimal      100 - 129     mg/dL         Near or Above Optimal      130 - 159     mg/dL         Borderline High      160 - 189     mg/dL         High       > 190        mg/dL         Very High     Appointment on 05/30/2015  Component Date Value Ref Range Status  . Fecal Occult Blood 05/30/2015 NEG  Negative Final  . Fecal Occult Blood 05/30/2015 NEG  Negative Final  . Fecal Occult Blood 05/30/2015 NEG  Negative Final   Past Medical History  Diagnosis Date  . Asthma   . PUD (peptic ulcer disease)   . Anxiety   . Bipolar 1 disorder   . Insomnia   . Hyperlipidemia   . Anemia   . Atrial fibrillation   . Peripheral arterial disease   . Heart murmur   . Shortness of breath dyspnea   . Pneumonia   . Depression   . Headache   . Arthritis    Past Surgical History  Procedure Laterality Date  . Shoulder surgery    . Nose surgery    . Cesarean section    . Nose  surgery    . Back surgery    . Tonsillectomy    . Aorta - bilateral femoral artery bypass graft N/A 10/23/2014    Procedure: AORTOBIFEMORAL BYPASS GRAFT;  Surgeon: Angelia Mould, MD;  Location: [place];  Service: Vascular;  Laterality: N/A;  . Abdominal aortagram N/A 09/12/2012    Procedure: ABDOMINAL Maxcine Ham;  Surgeon: Conrad Kenbridge, MD;  Location: [facility] CATH LAB;  Service: Cardiovascular;  Laterality: N/A;   Current Outpatient Prescriptions on File Prior to Visit  Medication Sig Dispense Refill  . albuterol (PROVENTIL HFA;VENTOLIN HFA) 108 (90 BASE) MCG/ACT inhaler Inhale 2 puffs into the lungs every 6 (six) hours as needed. For shortness of breath 18 g 3  . albuterol (PROVENTIL) (2.5 MG/3ML) 0.083% nebulizer solution Take 2.5 mg by nebulization every 6 (six) hours as needed. For shortness of breath    . aspirin 81 MG tablet Take 81 mg by mouth daily.    Marland Kitchen atorvastatin (LIPITOR) 80 MG tablet Take 1 tablet (80 mg total) by mouth daily. 30 tablet 6  . beclomethasone (QVAR) 80 MCG/ACT inhaler Inhale 2 puffs into the lungs 2 (two) times daily.    . clonazePAM (KLONOPIN) 1 MG tablet TAKE 1 TABLET THREE TIMES A DAY AS NEEDED 90 tablet 2  . cyclobenzaprine (FLEXERIL) 10 MG tablet TAKE 1 TABLET BY MOUTH 3 TIMES A DAY AS NEEDED FOR MUSCLE SPASMS (REFILLS MUST LAST 30 DAYS) 90 tablet 2  . diphenhydrAMINE (BENADRYL) 50 MG capsule Take 50 mg by mouth every 6 (six) hours as needed for itching.    Marland Kitchen EPINEPHrine (EPI-PEN) 0.3 mg/0.3 mL SOAJ injection Inject 0.3 mLs (0.3 mg total) into the muscle once. 1 Device 2  . ibuprofen (ADVIL,MOTRIN) 200 MG tablet Take 200 mg by mouth every 6 (six) hours as needed (for pain).     . Multiple Vitamin (MULTIVITAMIN) tablet Take 1 tablet by mouth daily.     No current facility-administered medications on file prior to visit.   Allergies  Allergen Reactions  .  Bee Pollen Swelling  . Cephalosporins Hives  . Codeine Nausea Only  . Doxycycline Hives  .  Tetracyclines & Related Hives  . Reflex Pain Relief [Menthol] Rash    Muscle Rub   History   Social History  . Marital Status: Married    Spouse Name: N/A  . Number of Children: N/A  . Years of Education: N/A   Occupational History  . Not on file.   Social History Main Topics  . Smoking status: Former Smoker -- 0.25 packs/day for 30 years    Types: Cigarettes    Quit date: 10/19/2014  . Smokeless tobacco: Never Used     Comment: pt states that she is dealing with some stressful events in her life and as a result has began smoking about 3-4 cigs per day  . Alcohol Use: 0.6 oz/week    1 Shots of liquor, 0 Standard drinks or equivalent per week     Comment: occ   . Drug Use: No  . Sexual Activity: Not on file   Other Topics Concern  . Not on file   Social History Narrative      Review of Systems  All other systems reviewed and are negative.      Objective:   Physical Exam  Cardiovascular: Normal rate, regular rhythm and normal heart sounds.   Pulmonary/Chest: Effort normal and breath sounds normal. No respiratory distress. She has no wheezes. She has no rales.  Abdominal: Soft. Bowel sounds are normal. She exhibits no distension. There is no tenderness. There is no rebound and no guarding.  Musculoskeletal: She exhibits no edema.       Right shoulder: She exhibits decreased range of motion, tenderness and pain.       Lumbar back: She exhibits decreased range of motion, tenderness, pain and spasm. She exhibits no bony tenderness.       Right upper leg: She exhibits tenderness. She exhibits no bony tenderness.  Vitals reviewed.         Assessment & Plan:  Hematuria - Plan: Urinalysis, Routine w reflex microscopic (not at [facility])  Hyperlipidemia  PVD (peripheral vascular disease)  Low back strain, initial encounter  Right shoulder pain  Patients urine is significant for hematuria. Her symptoms are classic for urinary tract infection. I will treat the patient  with Bactrim 1 by mouth twice a day for 5 days. Recheck urinalysis after that. If hematuria persists, proceed with cystoscopy. Cholesterol is excellent. I'll make no changes in her cholesterol medication. I believe she strained a muscle in her lower back as well as her upper hamstring. She can treat this with Flexeril as well as tramadol as needed. I believe her right shoulder pain is due to her chronic rotator cuff injury. If pain worsens we can inject her shoulder with cortisone. Patient would like to resume her Depakote for bipolar disorder. She reports more recent issues with mood swings, anger control issues, depression and anxiety. Therefore I'll start the patient back on Depakote extended release 500 mg by mouth daily at bedtime and increase to 1000 mg by mouth daily at bedtime in one month.

## 2015-07-15 ENCOUNTER — Other Ambulatory Visit: Payer: Self-pay | Admitting: Family Medicine

## 2015-07-16 NOTE — Telephone Encounter (Signed)
ok 

## 2015-07-16 NOTE — Telephone Encounter (Signed)
Ok to refill??  Last office visit 06/27/2015.  Last refill 04/19/2015, #2 refills.

## 2015-07-16 NOTE — Telephone Encounter (Signed)
Medication called to pharmacy. 

## 2015-07-25 ENCOUNTER — Other Ambulatory Visit: Payer: Self-pay | Admitting: Family Medicine

## 2015-07-25 NOTE — Telephone Encounter (Signed)
Ok to refill??  Last office visit/ refill 06/27/2015, #1 refill (per pharmacy was filled on 8/4 and 8/20).

## 2015-07-26 NOTE — Telephone Encounter (Signed)
Medication called to pharmacy. 

## 2015-07-26 NOTE — Telephone Encounter (Signed)
ok 

## 2015-07-26 NOTE — Telephone Encounter (Signed)
?   OK to Refill  

## 2015-08-02 ENCOUNTER — Telehealth: Payer: Self-pay | Admitting: *Deleted

## 2015-08-02 DIAGNOSIS — R319 Hematuria, unspecified: Secondary | ICD-10-CM

## 2015-08-02 NOTE — Telephone Encounter (Signed)
Pt called stating that was here on 08/04 with symptoms of Bladder infection and blood in urine, states still has these symptoms and are worse and wants to know if you would call something else in for her that is a little stronger or does she needs to come in and leave another urine sample. Please advise!  Call back number [phone]   [id number] [place], [place] - [address]

## 2015-08-02 NOTE — Telephone Encounter (Signed)
LMOVM to come in and give urine sample for culture

## 2015-08-02 NOTE — Telephone Encounter (Signed)
Can she give [place] a urine sample for cx if symptoms did not improve.  May not be infection.

## 2015-08-09 ENCOUNTER — Other Ambulatory Visit: Payer: Medicare PPO

## 2015-08-09 ENCOUNTER — Other Ambulatory Visit: Payer: Self-pay | Admitting: Family Medicine

## 2015-08-09 DIAGNOSIS — R300 Dysuria: Secondary | ICD-10-CM

## 2015-08-12 ENCOUNTER — Other Ambulatory Visit: Payer: Self-pay | Admitting: *Deleted

## 2015-08-12 DIAGNOSIS — R319 Hematuria, unspecified: Secondary | ICD-10-CM

## 2015-08-12 LAB — URINE CULTURE

## 2015-08-12 MED ORDER — SULFAMETHOXAZOLE-TRIMETHOPRIM 800-160 MG PO TABS: 1.0000 | ORAL_TABLET | Freq: Two times a day (BID) | ORAL | Status: DC

## 2015-08-16 ENCOUNTER — Telehealth: Payer: Self-pay | Admitting: Family Medicine

## 2015-08-16 NOTE — Telephone Encounter (Signed)
Pt aware.

## 2015-08-16 NOTE — Telephone Encounter (Signed)
Five days of abx should be sufficient for uti.  Stop bactrim

## 2015-08-16 NOTE — Telephone Encounter (Signed)
Pt is on day 5 of Bactrim and has now started breaking out in a rash but only on her right leg. Her left ankle and knee is swelling and she is having some slurred speech at night. MD Please advise.

## 2015-08-22 ENCOUNTER — Ambulatory Visit (INDEPENDENT_AMBULATORY_CARE_PROVIDER_SITE_OTHER): Payer: Medicare PPO | Admitting: Family Medicine

## 2015-08-22 ENCOUNTER — Encounter: Payer: Self-pay | Admitting: Family Medicine

## 2015-08-22 VITALS — BP 136/74 | HR 108 | Temp 99.1°F | Resp 20 | Ht <= 58 in | Wt 136.0 lb

## 2015-08-22 DIAGNOSIS — N950 Postmenopausal bleeding: Secondary | ICD-10-CM | POA: Diagnosis not present

## 2015-08-22 DIAGNOSIS — M5431 Sciatica, right side: Secondary | ICD-10-CM | POA: Diagnosis not present

## 2015-08-22 NOTE — Progress Notes (Signed)
Subjective:    Patient ID: Felicia [name], female    DOB: 30-Dec-1965, 49 y.o.   MRN: [id number]  HPI Patient is a very pleasant 49 year old white female. Patient underwent menopause 2 years and 9 months ago. She has not had vaginal bleeding and almost 3 years. Earlier this month she experienced 10 days of vaginal bleeding. She also had pelvic cramping. She also reports breast tenderness. Symptoms were consistent with a period.  In fact she states it felt just like one of her old periods. She also complains of persistent low back pain radiating into her right gluteus, down her right posterior thigh below her right knee. She mentioned this to me  In an office visit back in August. At that time we recommended watchful waiting. However the symptoms have persisted. They're in fact they're starting to get worse. Now she describes a deep ache radiating down her right leg. I know for longer feel that this is a muscle strain. Her pain is now starting to sound more neuropathic in nature. She denies any weakness in her legs or numbness in her legs. She denies any bowel or bladder incontinence. Past Medical History  Diagnosis Date  . Asthma   . PUD (peptic ulcer disease)   . Anxiety   . Bipolar 1 disorder   . Insomnia   . Hyperlipidemia   . Anemia   . Atrial fibrillation   . Peripheral arterial disease   . Heart murmur   . Shortness of breath dyspnea   . Pneumonia   . Depression   . Headache   . Arthritis    Past Surgical History  Procedure Laterality Date  . Shoulder surgery    . Nose surgery    . Cesarean section    . Nose surgery    . Back surgery    . Tonsillectomy    . Aorta - bilateral femoral artery bypass graft N/A 10/23/2014    Procedure: AORTOBIFEMORAL BYPASS GRAFT;  Surgeon: Chuck Hint, MD;  Location: [facility] OR;  Service: Vascular;  Laterality: N/A;  . Abdominal aortagram N/A 09/12/2012    Procedure: ABDOMINAL Ronny Flurry;  Surgeon: Fransisco Hertz, MD;  Location: [facility] CATH LAB;   Service: Cardiovascular;  Laterality: N/A;   Current Outpatient Prescriptions on File Prior to Visit  Medication Sig Dispense Refill  . albuterol (PROVENTIL HFA;VENTOLIN HFA) 108 (90 BASE) MCG/ACT inhaler Inhale 2 puffs into the lungs every 6 (six) hours as needed. For shortness of breath 18 g 3  . albuterol (PROVENTIL) (2.5 MG/3ML) 0.083% nebulizer solution Take 2.5 mg by nebulization every 6 (six) hours as needed. For shortness of breath    . aspirin 81 MG tablet Take 81 mg by mouth daily.    Marland Kitchen atorvastatin (LIPITOR) 80 MG tablet Take 1 tablet (80 mg total) by mouth daily. 30 tablet 6  . beclomethasone (QVAR) 80 MCG/ACT inhaler Inhale 2 puffs into the lungs 2 (two) times daily.    . clonazePAM (KLONOPIN) 1 MG tablet TAKE 1 TABLET THREE TIMES A DAY 90 tablet 2  . cyclobenzaprine (FLEXERIL) 10 MG tablet TAKE 1 TABLET BY MOUTH 3 TIMES A DAY AS NEEDED FOR MUSCLE SPASMS (REFILLS MUST LAST 30 DAYS) 90 tablet 2  . diphenhydrAMINE (BENADRYL) 50 MG capsule Take 50 mg by mouth every 6 (six) hours as needed for itching.    . divalproex (DEPAKOTE ER) 500 MG 24 hr tablet Take 2 tablets (1,000 mg total) by mouth at bedtime. 60 tablet 4  .  EPINEPHrine (EPI-PEN) 0.3 mg/0.3 mL SOAJ injection Inject 0.3 mLs (0.3 mg total) into the muscle once. 1 Device 2  . ibuprofen (ADVIL,MOTRIN) 200 MG tablet Take 200 mg by mouth every 6 (six) hours as needed (for pain).     . Multiple Vitamin (MULTIVITAMIN) tablet Take 1 tablet by mouth daily.    . traMADol (ULTRAM) 50 MG tablet TAKE 1 TO 2 TABLETS BY MOUTH 3 TIMES A DAY AS NEEDED FOR PAIN 60 tablet 1   No current facility-administered medications on file prior to visit.   Allergies  Allergen Reactions  . Bee Pollen Swelling  . Cephalosporins Hives  . Codeine Nausea Only  . Doxycycline Hives  . Tetracyclines & Related Hives  . Reflex Pain Relief [Menthol] Rash    Muscle Rub   Social History   Social History  . Marital Status: Married    Spouse Name: N/A  .  Number of Children: N/A  . Years of Education: N/A   Occupational History  . Not on file.   Social History Main Topics  . Smoking status: Former Smoker -- 0.25 packs/day for 30 years    Types: Cigarettes    Quit date: 10/19/2014  . Smokeless tobacco: Never Used     Comment: pt states that she is dealing with some stressful events in her life and as a result has began smoking about 3-4 cigs per day  . Alcohol Use: 0.6 oz/week    1 Shots of liquor, 0 Standard drinks or equivalent per week     Comment: occ   . Drug Use: No  . Sexual Activity: Not on file   Other Topics Concern  . Not on file   Social History Narrative      Review of Systems  All other systems reviewed and are negative.      Objective:   Physical Exam  Cardiovascular: Normal rate, regular rhythm and normal heart sounds.   Pulmonary/Chest: Effort normal and breath sounds normal. No respiratory distress. She has no wheezes. She has no rales.  Abdominal: Soft. Bowel sounds are normal. She exhibits no distension. There is no tenderness. There is no rebound and no guarding.  Musculoskeletal:       Lumbar back: She exhibits decreased range of motion, tenderness and pain. She exhibits no bony tenderness and no spasm.       Right upper leg: She exhibits no tenderness, no bony tenderness, no swelling and no deformity.  Vitals reviewed.         Assessment & Plan:  Post-menopausal bleeding - Plan: Ambulatory referral to Gynecology  Right sided sciatica - Plan: MR Lumbar Spine Wo Contrast  Patient symptoms sound like a typical menstrual cycle. However given the fact she's had no menstrual cycle in the last 2 years and 9 months, I believe she needs a workup to rule out endometrial cancer. Therefore I will refer the patient to a gynecologist for an endometrial biopsy and likely a ultrasound of the pelvis to rule out endometrial polyps.  Her symptoms are also consistent with right-sided sciatica from likely a  herniated disc. She has failed conservative treatment and watchful waiting now. The pain has been present for more than 6 weeks. Therefore I'll refer the patient for an MRI of the lumbar spine to evaluate for herniated disc.

## 2015-08-27 ENCOUNTER — Other Ambulatory Visit: Payer: Self-pay | Admitting: Family Medicine

## 2015-08-27 NOTE — Telephone Encounter (Signed)
ok 

## 2015-08-27 NOTE — Telephone Encounter (Signed)
LRF 07/26/15 #60 + 1.  LOV 08/22/15  OK refill?

## 2015-08-29 NOTE — Telephone Encounter (Signed)
Medication called to pharmacy. 

## 2015-09-07 ENCOUNTER — Ambulatory Visit
Admission: RE | Admit: 2015-09-07 | Discharge: 2015-09-07 | Disposition: A | Discharge status: Home / Self Care | Payer: Medicare PPO | Source: Ambulatory Visit | Attending: Family Medicine | Admitting: Family Medicine

## 2015-09-07 DIAGNOSIS — M5431 Sciatica, right side: Secondary | ICD-10-CM

## 2015-09-07 DIAGNOSIS — M5126 Other intervertebral disc displacement, lumbar region: Secondary | ICD-10-CM | POA: Diagnosis not present

## 2015-09-20 ENCOUNTER — Encounter: Payer: Self-pay | Admitting: Gynecology

## 2015-09-20 ENCOUNTER — Ambulatory Visit (INDEPENDENT_AMBULATORY_CARE_PROVIDER_SITE_OTHER): Payer: Medicare PPO | Admitting: Gynecology

## 2015-09-20 VITALS — BP 134/86 | Ht <= 58 in | Wt 142.0 lb

## 2015-09-20 DIAGNOSIS — Z23 Encounter for immunization: Secondary | ICD-10-CM

## 2015-09-20 DIAGNOSIS — R102 Pelvic and perineal pain: Secondary | ICD-10-CM | POA: Insufficient documentation

## 2015-09-20 DIAGNOSIS — N840 Polyp of corpus uteri: Secondary | ICD-10-CM | POA: Diagnosis not present

## 2015-09-20 DIAGNOSIS — N950 Postmenopausal bleeding: Secondary | ICD-10-CM | POA: Insufficient documentation

## 2015-09-20 NOTE — Patient Instructions (Signed)

## 2015-09-20 NOTE — Progress Notes (Signed)
   Patient is a 49 year old gravida 3 para 2 Ab1 (1 ectopic/laparoscopic, 1 vaginal, 1 C-section) who was referred to our practice as a courtesy of her primary care physician Dr. Lynnea FerrierWarren Pickard as a result of patient's postmenopausal bleeding. Patient stated that her menarche was at the age of 49. She states she reached the menopause approximately 2 years ago. She first noticed a vaginal bleeding after she went to the bathroom to wipe. She then used a pad during the day and continued to notice bleeding. One night after she will go she notice her pad for blood as well. She's having some minimal spotting today. She also had complained of some cramping and low abdominal pressure. She has no GU or GI complaints. She had a normal Pap smear this year at her PCP office. Patient reports no family history of any gynecological malignancy. Patient had a colonoscopy in 2015 and had her mammogram this year. Patient states that she has never been on any hormone replacement therapy in the past.  Exam: Blood pressure 134/86   Weight 142 pounds   BMI 29.68 kg/m   height 4 feet 10 inches tall Gen. 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 Abdomen: Soft slightly pendulous nontender no rebound or guarding Back: No CVA tenderness Pelvic: Bartholin urethra Skene glands within normal limits with atrophic changes Vagina: Atrophic changes were noted but no gross lesion noted Cervix: Atrophic changes but no gross lesion noted Uterus: Anteverted upper limits of normal nontender and mobile Adnexa: No discernible mass or tenderness Rectal exam not performed  Patient was counseled for endometrial biopsy as a result of her postmenopausal bleeding. The cervix was cleansed with Betadine solution. A sterile Pipelle was introduced into the uterine cavity after several passes and tissue was obtained but not in large quantities. There may be an endometrial polyp or myoma because of the rigidity of the  Pipelle as we are going in and out to obtain some sampling I encountered some resistance. Her uterus sounded to 6 Cm. Tissue submitted for histological evaluation.  Assessment/plan: 49 year old patient with postmenopausal bleeding on no hormone replacement therapy. Endometrial biopsy today with little tissue obtained and submitted for histological evaluation. Patient will return back to the office next week for sonohysterogram for better assessment of the intrauterine cavity to rule out submucous myoma or endometrial polyp. Patient received the flu vaccine today.

## 2015-09-20 NOTE — Addendum Note (Signed)
Addended by: Ok EdwardsFERNANDEZ, JUAN H on: 09/20/2015 09:54 AM   Modules accepted: Level of Service

## 2015-09-25 ENCOUNTER — Encounter: Payer: Self-pay | Admitting: Family Medicine

## 2015-09-25 ENCOUNTER — Other Ambulatory Visit: Payer: Self-pay | Admitting: Family Medicine

## 2015-09-25 ENCOUNTER — Telehealth: Payer: Self-pay | Admitting: Gynecology

## 2015-09-25 NOTE — Telephone Encounter (Signed)
Ok to refill 

## 2015-09-25 NOTE — Telephone Encounter (Signed)
09/25/15-I spoke w/pt today to let her know that her [employer] [facility] covers the sonohysterogram with her $40.00 copay. I spoke with [place], the Ref #[id number]

## 2015-09-26 NOTE — Telephone Encounter (Signed)
Medication refilled per protocol. 

## 2015-09-26 NOTE — Telephone Encounter (Signed)
ok 

## 2015-09-27 ENCOUNTER — Other Ambulatory Visit: Payer: Self-pay | Admitting: Gynecology

## 2015-09-27 DIAGNOSIS — N950 Postmenopausal bleeding: Secondary | ICD-10-CM

## 2015-09-30 ENCOUNTER — Telehealth: Payer: Self-pay

## 2015-09-30 ENCOUNTER — Ambulatory Visit (INDEPENDENT_AMBULATORY_CARE_PROVIDER_SITE_OTHER): Payer: Medicare PPO

## 2015-09-30 ENCOUNTER — Ambulatory Visit (INDEPENDENT_AMBULATORY_CARE_PROVIDER_SITE_OTHER): Payer: Medicare PPO | Admitting: Gynecology

## 2015-09-30 ENCOUNTER — Other Ambulatory Visit: Payer: Self-pay | Admitting: Gynecology

## 2015-09-30 ENCOUNTER — Encounter: Payer: Self-pay | Admitting: Gynecology

## 2015-09-30 ENCOUNTER — Other Ambulatory Visit: Payer: Self-pay | Admitting: Family Medicine

## 2015-09-30 DIAGNOSIS — R102 Pelvic and perineal pain: Secondary | ICD-10-CM

## 2015-09-30 DIAGNOSIS — N9489 Other specified conditions associated with female genital organs and menstrual cycle: Secondary | ICD-10-CM | POA: Diagnosis not present

## 2015-09-30 DIAGNOSIS — N840 Polyp of corpus uteri: Secondary | ICD-10-CM | POA: Insufficient documentation

## 2015-09-30 DIAGNOSIS — N950 Postmenopausal bleeding: Secondary | ICD-10-CM

## 2015-09-30 NOTE — Telephone Encounter (Signed)
Left message for patient to call me about scheduling. 

## 2015-09-30 NOTE — Telephone Encounter (Signed)
Ok to refill 

## 2015-09-30 NOTE — Progress Notes (Signed)
   Patient presented to the office today for her sonohysterogram/ultrasound as part of her evaluation for postmenopausal bleeding. Patient was seen the office in August 20 of this year and the following was my note:  "Patient is a 49 year old gravida 3 para 2 Ab1 (1 ectopic/laparoscopic, 1 vaginal, 1 C-section) who was referred to our practice as a courtesy of her primary care physician Dr. Lynnea FerrierWarren Pickard as a result of patient's postmenopausal bleeding. Patient stated that her menarche was at the age of 49. She states she reached the menopause approximately 2 years ago. She first noticed a vaginal bleeding after she went to the bathroom to wipe. She then used a pad during the day and continued to notice bleeding. One night after she will go she notice her pad for blood as well. She's having some minimal spotting today. She also had complained of some cramping and low abdominal pressure. She has no GU or GI complaints. She had a normal Pap smear this year at her PCP office. Patient reports no family history of any gynecological malignancy. Patient had a colonoscopy in 2015 and had her mammogram this year. Patient states that she has never been on any hormone replacement therapy in the past."  On that office visit patient underwent an endometrial biopsy in the following was the report: Diagnosis Endometrium, biopsy, uterus - FRAGMENTS OF ENDOMETRIAL-TYPE POLYP. - BACKGROUND HIGHLY FRAGMENTED BENIGN ENDOMETRIAL TISSUE. - NO ATYPIA OR MALIGNANCY IDENTIFIED  Today's ultrasound/sono hysterogram as follows: Uterus measures 6.8 x 5.1 x 3.7 cm with endometrial stripe of 1.9 mm. Right and left ovary were normal. After the cervix was cleansed with Betadine solution a sterile catheter was introduced into the uterine cavity whereby in normal saline was instilled. It was noted at the right posterior fundal region there was a 14 x 12 x 5 mm defect which what appears to be an endometrial polyp.  Assessment/plan:  Patient with postmenopausal bleeding workup consisting of a benign endometrial biopsy demonstrating fragments of endometrial polyp. Confirmation as to the reason for her bleeding determine be a sonohysterogram which demonstrated an endometrial polyp in the fundus of the uterus. Patient was provided with literature information pictures shown to the patient and recommendation for outpatient resectoscopic polypectomy which will be scheduled the next few weeks. We will need to see her prior to her surgery for preop consultation.

## 2015-10-01 NOTE — Telephone Encounter (Signed)
ok 

## 2015-10-01 NOTE — Telephone Encounter (Signed)
Prescription sent to pharmacy.

## 2015-10-03 ENCOUNTER — Telehealth: Payer: Self-pay

## 2015-10-03 ENCOUNTER — Encounter: Payer: Self-pay | Admitting: Family Medicine

## 2015-10-03 ENCOUNTER — Ambulatory Visit (INDEPENDENT_AMBULATORY_CARE_PROVIDER_SITE_OTHER): Payer: Medicare PPO | Admitting: Family Medicine

## 2015-10-03 VITALS — BP 142/72 | HR 110 | Temp 98.9°F | Resp 20 | Ht <= 58 in | Wt 140.0 lb

## 2015-10-03 DIAGNOSIS — L0100 Impetigo, unspecified: Secondary | ICD-10-CM

## 2015-10-03 MED ORDER — CLONAZEPAM 1 MG PO TABS: 1.0000 mg | ORAL_TABLET | Freq: Three times a day (TID) | ORAL | Status: DC

## 2015-10-03 MED ORDER — MUPIROCIN CALCIUM 2 % EX CREA: 1.0000 "application " | TOPICAL_CREAM | Freq: Two times a day (BID) | CUTANEOUS | Status: DC

## 2015-10-03 NOTE — Telephone Encounter (Signed)
Scheduled for Hyst, D&C on 10/15/15. You had requested pre op consult and she is scheduled next week. She did get medical clearance today from her PCP.  It is in her chart as her PCP is a [facility].  Also, see below.   Donita BrooksWarren T Pickard, MD at 10/03/2015 11:23 AM     Status: Signed       Expand All Collapse All     Subjective:    Patient ID: Felicia [name], female DOB: 07/06/1966, 49 y.o. MRN: [id number]  HPI Patient has a lesion on her right cheek approximately the size of a dime. It is an erythematous shallow abrasion with honey colored scale. Granddaughter recently had impetigo. She is also requesting surgical clearance for an upcoming hysteroscopy. When I last saw the patient in September, she was having vaginal bleeding. Workup a GYN revealed endometrial polyp. She denies any angina or shortness of breath or chest pain. She has no symptoms of unstable angina. Therefore her for low risk surgical clearance she requires no further testing.

## 2015-10-03 NOTE — Progress Notes (Signed)
Subjective:    Patient ID: Felicia [name], female    DOB: Apr 22, 1966, 49 y.o.   MRN: [id number]  HPI Patient has a lesion on her right cheek approximately the size of a dime. It is an erythematous shallow abrasion with honey colored scale.  Granddaughter recently had impetigo. She is also requesting surgical clearance for an upcoming hysteroscopy. When I last saw the patient in September, she was having vaginal bleeding. Workup a GYN revealed endometrial polyp. She denies any angina or shortness of breath or chest pain. She has no symptoms of unstable angina. Therefore her for low risk surgical clearance she requires no further testing. Past Medical History  Diagnosis Date  . Asthma   . PUD (peptic ulcer disease)   . Anxiety   . Bipolar 1 disorder (HCC)   . Insomnia   . Hyperlipidemia   . Anemia   . Atrial fibrillation (HCC)   . Peripheral arterial disease (HCC)   . Heart murmur   . Shortness of breath dyspnea   . Pneumonia   . Depression   . Headache   . Arthritis   . Ectopic pregnancy    Past Surgical History  Procedure Laterality Date  . Shoulder surgery    . Nose surgery    . Cesarean section    . Nose surgery    . Back surgery    . Tonsillectomy    . Aorta - bilateral femoral artery bypass graft N/A 10/23/2014    Procedure: AORTOBIFEMORAL BYPASS GRAFT;  Surgeon: Chuck Hint, MD;  [facility];  Service: Vascular;  Laterality: N/A;  . Abdominal aortagram N/A 09/12/2012    Procedure: ABDOMINAL Ronny Flurry;  Surgeon: Fransisco Hertz, MD;  Location: [facility] CATH LAB;  Service: Cardiovascular;  Laterality: N/A;   Current Outpatient Prescriptions on File Prior to Visit  Medication Sig Dispense Refill  . albuterol (PROVENTIL HFA;VENTOLIN HFA) 108 (90 BASE) MCG/ACT inhaler Inhale 2 puffs into the lungs every 6 (six) hours as needed. For shortness of breath 18 g 3  . aspirin 81 MG tablet Take 81 mg by mouth daily.    Marland Kitchen atorvastatin (LIPITOR) 80 MG tablet Take 1 tablet (80  mg total) by mouth daily. 30 tablet 6  . beclomethasone (QVAR) 80 MCG/ACT inhaler Inhale 2 puffs into the lungs 2 (two) times daily.    . cyclobenzaprine (FLEXERIL) 10 MG tablet TAKE 1 TABLET BY MOUTH 3 TIMES A DAY AS NEEDED FOR MUSCLE SPASMS 90 tablet 2  . diphenhydrAMINE (BENADRYL) 50 MG capsule Take 50 mg by mouth every 6 (six) hours as needed for itching.    . divalproex (DEPAKOTE ER) 500 MG 24 hr tablet Take 2 tablets (1,000 mg total) by mouth at bedtime. (Patient taking differently: Take 500 mg by mouth at bedtime. ) 60 tablet 4  . EPINEPHrine (EPI-PEN) 0.3 mg/0.3 mL SOAJ injection Inject 0.3 mLs (0.3 mg total) into the muscle once. 1 Device 2  . ibuprofen (ADVIL,MOTRIN) 200 MG tablet Take 400-600 mg by mouth every 6 (six) hours as needed for headache or moderate pain (for pain).     . Multiple Vitamin (MULTIVITAMIN) tablet Take 1 tablet by mouth daily.    Marland Kitchen neomycin-bacitracin-polymyxin (NEOSPORIN) ointment Apply 1 application topically as needed for wound care (spider (?) bite). apply to eye    . traMADol (ULTRAM) 50 MG tablet TAKE 1 TO 2 TABLETS BY MOUTH 3 TIMES A DAY AS NEEDED FOR PAIN 60 tablet 1   No current facility-administered medications  on file prior to visit.   Allergies  Allergen Reactions  . Bactrim [Sulfamethoxazole-Trimethoprim] Hives  . Bee Pollen Swelling  . Cephalosporins Hives  . Codeine Nausea Only  . Doxycycline Hives  . Tetracyclines & Related Hives  . Reflex Pain Relief [Menthol] Rash    Muscle Rub   Social History   Social History  . Marital Status: Married    Spouse Name: N/A  . Number of Children: N/A  . Years of Education: N/A   Occupational History  . Not on file.   Social History Main Topics  . Smoking status: Former Smoker -- 0.25 packs/day for 30 years    Types: Cigarettes    Quit date: 10/19/2014  . Smokeless tobacco: Never Used     Comment: pt states that she is dealing with some stressful events in her life and as a result has began  smoking about 3-4 cigs per day  . Alcohol Use: 0.6 oz/week    1 Shots of liquor, 0 Standard drinks or equivalent per week     Comment: occ   . Drug Use: No  . Sexual Activity: Yes   Other Topics Concern  . Not on file   Social History Narrative      Review of Systems  All other systems reviewed and are negative.      Objective:   Physical Exam  Cardiovascular: Normal rate, regular rhythm and normal heart sounds.   Pulmonary/Chest: Effort normal and breath sounds normal. No respiratory distress. She has no wheezes. She has no rales.  Abdominal: Soft. Bowel sounds are normal.  Skin: There is erythema.  Vitals reviewed.  lesion as described in history of present illness        Assessment & Plan:  Impetigo - Plan: mupirocin cream (BACTROBAN) 2 %  Begin Bactroban ointment applied twice a day for 1 week. Recheck if no better in 1 week. Otherwise she is cleared from a medical standpoint to proceed with her hysteroscopy.

## 2015-10-03 NOTE — Telephone Encounter (Signed)
Check with her primary to see if this lesion on her face was evaluated because of it is infectious we may need to postpone her surgery for a few weeks. Let's me know

## 2015-10-04 NOTE — Telephone Encounter (Signed)
The rest of the rest of the office visit note is in her chart. However here is the assessment and plan.  Assessment & Plan:  Impetigo - Plan: mupirocin cream (BACTROBAN) 2 %  Begin Bactroban ointment applied twice a day for 1 week. Recheck if no better in 1 week. Otherwise she is cleared from a medical standpoint to proceed with her hysteroscopy

## 2015-10-04 NOTE — Telephone Encounter (Signed)
Okay reassure she is scheduled

## 2015-10-08 ENCOUNTER — Other Ambulatory Visit: Payer: Self-pay | Admitting: Family Medicine

## 2015-10-08 ENCOUNTER — Encounter (HOSPITAL_COMMUNITY): Payer: Self-pay

## 2015-10-08 ENCOUNTER — Encounter (HOSPITAL_COMMUNITY)
Admission: RE | Admit: 2015-10-08 | Discharge: 2015-10-08 | Disposition: A | Discharge status: Home / Self Care | Payer: Medicare PPO | Source: Ambulatory Visit | Attending: Gynecology | Admitting: Gynecology

## 2015-10-08 DIAGNOSIS — N840 Polyp of corpus uteri: Secondary | ICD-10-CM | POA: Insufficient documentation

## 2015-10-08 DIAGNOSIS — N950 Postmenopausal bleeding: Secondary | ICD-10-CM | POA: Diagnosis not present

## 2015-10-08 DIAGNOSIS — Z01818 Encounter for other preprocedural examination: Secondary | ICD-10-CM | POA: Diagnosis not present

## 2015-10-08 LAB — CBC
HCT: 39.5 % (ref 36.0–46.0)
Hemoglobin: 13 g/dL (ref 12.0–15.0)
MCH: 30 pg (ref 26.0–34.0)
MCHC: 32.9 g/dL (ref 30.0–36.0)
MCV: 91 fL (ref 78.0–100.0)
PLATELETS: 242 10*3/uL (ref 150–400)
RBC: 4.34 MIL/uL (ref 3.87–5.11)
RDW: 13.2 % (ref 11.5–15.5)
WBC: 7.3 10*3/uL (ref 4.0–10.5)

## 2015-10-08 NOTE — Patient Instructions (Signed)
Your procedure is scheduled on:  October 15, 2015    Enter through the Main Entrance of [facility] at:  9:30 am   Pick up the phone at the desk and dial [phone].  Call this number if you have problems the morning of surgery: [phone].  Remember: Do NOT eat food:  After midnight on Monday  Do NOT drink clear liquids after: midnight on Monday   Take these medicines the morning of surgery with a SIP OF WATER:  klonipin and flexeril    Do NOT wear jewelry (body piercing), metal hair clips/bobby pins, make-up, or nail polish. Do NOT wear lotions, powders, or perfumes.  You may wear deoderant. Do NOT shave for 48 hours prior to surgery. Do NOT bring valuables to the hospital. Contacts, dentures, or bridgework may not be worn into surgery. Have a responsible adult drive you home and stay with you for 24 hours after your procedure

## 2015-10-08 NOTE — Telephone Encounter (Signed)
Refill appropriate and filled per protocol. 

## 2015-10-10 NOTE — Anesthesia Preprocedure Evaluation (Addendum)
Anesthesia Evaluation  Patient identified by MRN, date of birth, ID band Patient awake    Reviewed: Allergy & Precautions, H&P , NPO status , Patient's Chart, lab work & pertinent test results  Airway Mallampati: II   Neck ROM: full    Dental  (+) Dental Advisory Given, Teeth Intact   Pulmonary shortness of breath, asthma , Current Smoker, former smoker,  PFT mild obstructive disease, 2016   breath sounds clear to auscultation       Cardiovascular + Peripheral Vascular Disease  + dysrhythmias Atrial Fibrillation  Rhythm:Regular  STRESS 09/2014 normal with EF 69%   Neuro/Psych  Headaches, Anxiety Depression Bipolar Disorder    GI/Hepatic Neg liver ROS, PUD,   Endo/Other  negative endocrine ROS  Renal/GU negative Renal ROS     Musculoskeletal  (+) Arthritis ,   Abdominal (+)  Abdomen: soft.    Peds  Hematology 13/39   Anesthesia Other Findings   Reproductive/Obstetrics                           Anesthesia Physical  Anesthesia Plan  ASA: II  Anesthesia Plan: General   Post-op Pain Management:    Induction: Intravenous  Airway Management Planned: LMA  Additional Equipment:   Intra-op Plan:   Post-operative Plan: Extubation in OR  Informed Consent: I have reviewed the patients History and Physical, chart, labs and discussed the procedure including the risks, benefits and alternatives for the proposed anesthesia with the patient or authorized representative who has indicated his/her understanding and acceptance.     Plan Discussed with: CRNA, Anesthesiologist and Surgeon  Anesthesia Plan Comments:        Anesthesia Quick Evaluation

## 2015-10-11 ENCOUNTER — Telehealth: Payer: Self-pay

## 2015-10-11 ENCOUNTER — Ambulatory Visit (INDEPENDENT_AMBULATORY_CARE_PROVIDER_SITE_OTHER): Payer: Medicare PPO | Admitting: Gynecology

## 2015-10-11 ENCOUNTER — Encounter: Payer: Self-pay | Admitting: Gynecology

## 2015-10-11 VITALS — BP 136/88 | Temp 97.7°F

## 2015-10-11 DIAGNOSIS — N840 Polyp of corpus uteri: Secondary | ICD-10-CM

## 2015-10-11 DIAGNOSIS — N950 Postmenopausal bleeding: Secondary | ICD-10-CM | POA: Diagnosis not present

## 2015-10-11 DIAGNOSIS — J018 Other acute sinusitis: Secondary | ICD-10-CM | POA: Diagnosis not present

## 2015-10-11 DIAGNOSIS — Z01818 Encounter for other preprocedural examination: Secondary | ICD-10-CM | POA: Diagnosis not present

## 2015-10-11 MED ORDER — AMOXICILLIN-POT CLAVULANATE 875-125 MG PO TABS: 1.0000 | ORAL_TABLET | Freq: Two times a day (BID) | ORAL | Status: DC

## 2015-10-11 NOTE — Progress Notes (Signed)
Felicia [name] is an 49 y.o. female is here today for preoperative examination and consultation. Patient scheduled for resectoscopic polypectomy next week as a result of endometrial polyp contributing to her postmenopausal bleeding. Her history is as follows: Patient was seen the office in August 20 of this year and the following was my note:  "Patient is a 49 year old gravida 3 para 2 Ab1 (1 ectopic/laparoscopic, 1 vaginal, 1 C-section) who was referred to our practice as a courtesy of her primary care physician Dr. Lynnea FerrierWarren Pickard as a result of patient's postmenopausal bleeding. Patient stated that her menarche was at the age of 49. She states she reached the menopause approximately 2 years ago. She first noticed a vaginal bleeding after she went to the bathroom to wipe. She then used a pad during the day and continued to notice bleeding. One night after she will go she notice her pad for blood as well. She's having some minimal spotting today. She also had complained of some cramping and low abdominal pressure. She has no GU or GI complaints. She had a normal Pap smear this year at her PCP office. Patient reports no family history of any gynecological malignancy. Patient had a colonoscopy in 2015 and had her mammogram this year. Patient states that she has never been on any hormone replacement therapy in the past."  On that office visit patient underwent an endometrial biopsy in the following was the report: Diagnosis Endometrium, biopsy, uterus - FRAGMENTS OF ENDOMETRIAL-TYPE POLYP. - BACKGROUND HIGHLY FRAGMENTED BENIGN ENDOMETRIAL TISSUE. - NO ATYPIA OR MALIGNANCY IDENTIFIED  Ultrasound/sono hysterogram 09/30/2015: Uterus measures 6.8 x 5.1 x 3.7 cm with endometrial stripe of 1.9 mm. Right and left ovary were normal. After the cervix was cleansed with Betadine solution a sterile catheter was introduced into the uterine cavity whereby in normal saline was instilled. It was noted at the right  posterior fundal region there was a 14 x 12 x 5 mm defect which what appears to be an endometrial polyp.  Patient was complaining today of postnasal drip frontal sinus tenderness and dry cough   Pertinent Gynecological History: Menses: post-menopausal Bleeding: post menopausal bleeding Contraception: post menopausal status DES exposure: denies Blood transfusions: none Sexually transmitted diseases: no past history Previous GYN Procedures: One vaginal delivery, one cesarean section, 1 laparoscopic procedure to remove right ectopic pregnancy and ovarian cyst  Last mammogram: normal Date: 2016 Last pap: normal Date: 2016 OB History: G 3, P2 A1 (right ectopic pregnancy)   Menstrual History: Menarche age: 89 Patient's last menstrual period was 12/26/2012.    Past Medical History  Diagnosis Date  . Asthma   . PUD (peptic ulcer disease)   . Anxiety   . Bipolar 1 disorder (HCC)   . Insomnia   . Hyperlipidemia   . Anemia   . Atrial fibrillation (HCC)   . Peripheral arterial disease (HCC)   . Heart murmur   . Shortness of breath dyspnea   . Pneumonia   . Depression   . Headache   . Arthritis   . Ectopic pregnancy     Past Surgical History  Procedure Laterality Date  . Shoulder surgery    . Nose surgery    . Cesarean section    . Nose surgery    . Back surgery    . Tonsillectomy    . Aorta - bilateral femoral artery bypass graft N/A 10/23/2014    Procedure: AORTOBIFEMORAL BYPASS GRAFT;  Surgeon: Chuck Hinthristopher S Dickson, MD;  Location: [facility] OR;  Service: Vascular;  Laterality: N/A;  . Abdominal aortagram N/A 09/12/2012    Procedure: ABDOMINAL AORTAGRAM;  Surgeon: Fransisco HertzBrian L Chen, MD;  [facility] C[facility] LAB;  Service: Cardiovascular;  Laterality: N/A;  . Tubal ligation      Family History  Problem Relation Age of Onset  . Heart disease Mother   . Diabetes Mother   . Hyperlipidemia Mother   . Hypertension Mother   . Heart attack Mother   . Heart disease Father   . Diabetes  Father   . Heart attack Father   . Hypertension Father   . Diabetes Brother     Social History:  reports that she quit smoking about a year ago. Her smoking use included Cigarettes. She has a 7.5 pack-year smoking history. She has never used smokeless tobacco. She reports that she drinks about 0.6 oz of alcohol per week. She reports that she does not use illicit drugs.  Allergies:  Allergies  Allergen Reactions  . Bactrim [Sulfamethoxazole-Trimethoprim] Hives  . Bee Pollen Swelling  . Cephalosporins Hives  . Codeine Nausea Only  . Doxycycline Hives  . Tetracyclines & Related Hives  . Reflex Pain Relief [Menthol] Rash    Muscle Rub     (Not in a hospital admission)  REVIEW OF SYSTEMS: A ROS was performed and pertinent positives and negatives are included in the history.  GENERAL: No fevers or chills. HEENT: No change in vision, no earache, sore throat or sinus congestion. NECK: No pain or stiffness. CARDIOVASCULAR: No chest pain or pressure. No palpitations. PULMONARY: No shortness of breath, cough or wheeze. GASTROINTESTINAL: No abdominal pain, nausea, vomiting or diarrhea, melena or bright red blood per rectum. GENITOURINARY: No urinary frequency, urgency, hesitancy or dysuria. MUSCULOSKELETAL: No joint or muscle pain, no back pain, no recent trauma. DERMATOLOGIC: No rash, no itching, no lesions. ENDOCRINE: No polyuria, polydipsia, no heat or cold intolerance. No recent change in weight. HEMATOLOGICAL: No anemia or easy bruising or bleeding. NEUROLOGIC: No headache, seizures, numbness, tingling or weakness. PSYCHIATRIC: No depression, no loss of interest in normal activity or change in sleep pattern.     Blood pressure 136/88, temperature 97.7 F (36.5 C), last menstrual period 12/26/2012.  Physical Exam:  HEENT: Tender frontal sinuses  Neck:Supple, midline, no thyroid megaly, no carotid bruits Lungs:  Clear to auscultation no rhonchi's or wheezes Heart:Regular rate and rhythm,  no murmurs or gallops Breast Exam: Symmetrical in appearance no palpable masses or tenderness no supraclavicular axillary lymphadenopathy  Abdomen: Soft nontender no rebound or guarding Pelvic:BUS within normal limits with atrophic changes Vagina: No lesions or discharge atrophic changes Cervix: Same as above Uterus: Retroverted normal size shape and consistency Adnexa: No palpable mass or tenderness Extremities: No cords, no edema Rectal: Not examined   Assessment/Plan: Postmenopausal patient with vaginal bleeding attributed to endometrial polyp. Preoperative examination consisting of an endometrial biopsy which confirmed endometrial-type polyp and benign endometrial tissue. Sonohysterogram confirmed also an endometrial polyp. Patient is scheduled for outpatient resectoscopic polypectomy next week the following risk benefits and pros and cons from her surgery were discussed with the patient questions answered:                        Patient was counseled as to the risk of surgery to include the following:  1. Infection (prohylactic antibiotics will be administered)  2. DVT/Pulmonary Embolism (prophylactic pneumo compression stockings will be used)  3.Trauma to internal organs requiring additional surgical procedure to repair any  injury to     Internal organs requiring perhaps additional hospitalization days.  4.Hemmorhage requiring transfusion and blood products which carry risks such as             anaphylactic reaction, hepatitis and AIDS  Patient had received literature information on the procedure scheduled and all her questions were answered and fully accepts all risk.  For patient's acute sinusitis she's going to be placed on Augmentin 875 mg 1 by mouth twice a day for 7 days.   [facility] HMD3:36 PMTD@Note :

## 2015-10-11 NOTE — Telephone Encounter (Signed)
I received this message from Dr. Glenetta HewJF "  Olegario MessierKathy, this patient is having surgery on Tuesday I need to cancel her antibiotic since I am putting her on antibiotic today because of an acute sinusitis.   I called pre admitting RN and left a message inquiring is this something they can help me with or does he have to go into his orders set to delete the order.

## 2015-10-11 NOTE — Patient Instructions (Addendum)
Amoxicillin; Clavulanic Acid tablets  What is this medicine?  AMOXICILLIN; CLAVULANIC ACID (a mox i SIL in; KLAV yoo lan ic AS id) is a penicillin antibiotic. It is used to treat certain kinds of bacterial infections. It will not work for colds, flu, or other viral infections.  This medicine may be used for other purposes; ask your health care provider or pharmacist if you have questions.  What should I tell my health care provider before I take this medicine?  They need to know if you have any of these conditions:  -bowel disease, like colitis  -kidney disease  -liver disease  -mononucleosis  -an unusual or allergic reaction to amoxicillin, penicillin, cephalosporin, other antibiotics, clavulanic acid, other medicines, foods, dyes, or preservatives  -pregnant or trying to get pregnant  -breast-feeding  How should I use this medicine?  Take this medicine by mouth with a full glass of water. Follow the directions on the prescription label. Take at the start of a meal. Do not crush or chew. If the tablet has a score line, you may cut it in half at the score line for easier swallowing. Take your medicine at regular intervals. Do not take your medicine more often than directed. Take all of your medicine as directed even if you think you are better. Do not skip doses or stop your medicine early.  Talk to your pediatrician regarding the use of this medicine in children. Special care may be needed.  Overdosage: If you think you have taken too much of this medicine contact a poison control center or emergency room at once.  NOTE: This medicine is only for you. Do not share this medicine with others.  What if I miss a dose?  If you miss a dose, take it as soon as you can. If it is almost time for your next dose, take only that dose. Do not take double or extra doses.  What may interact with this medicine?  -allopurinol  -anticoagulants  -birth control pills  -methotrexate  -probenecid  This list may not describe all possible  interactions. Give your health care provider a list of all the medicines, herbs, non-prescription drugs, or dietary supplements you use. Also tell them if you smoke, drink alcohol, or use illegal drugs. Some items may interact with your medicine.  What should I watch for while using this medicine?  Tell your doctor or health care professional if your symptoms do not improve.  Do not treat diarrhea with over the counter products. Contact your doctor if you have diarrhea that lasts more than 2 days or if it is severe and watery.  If you have diabetes, you may get a false-positive result for sugar in your urine. Check with your doctor or health care professional.  Birth control pills may not work properly while you are taking this medicine. Talk to your doctor about using an extra method of birth control.  What side effects may I notice from receiving this medicine?  Side effects that you should report to your doctor or health care professional as soon as possible:  -allergic reactions like skin rash, itching or hives, swelling of the face, lips, or tongue  -breathing problems  -dark urine  -fever or chills, sore throat  -redness, blistering, peeling or loosening of the skin, including inside the mouth  -seizures  -trouble passing urine or change in the amount of urine  -unusual bleeding, bruising  -unusually weak or tired  -white patches or sores in the mouth   side effects. Call your doctor for medical advice about side effects. You may report side effects to FDA at 1-800-FDA-1088. Where should I keep my medicine? Keep out of the reach of children. Store at room temperature below 25 degrees C (77 degrees F). Keep container  tightly closed. Throw away any unused medicine after the expiration date. NOTE: This sheet is a summary. It may not cover all possible information. If you have questions about this medicine, talk to your doctor, pharmacist, or health care provider.    2016, Elsevier/Gold Standard. (2008-02-02 12:04:30) Sinusitis, Adult Sinusitis is redness, soreness, and inflammation of the paranasal sinuses. Paranasal sinuses are air pockets within the bones of your face. They are located beneath your eyes, in the middle of your forehead, and above your eyes. In healthy paranasal sinuses, mucus is able to drain out, and air is able to circulate through them by way of your nose. However, when your paranasal sinuses are inflamed, mucus and air can become trapped. This can allow bacteria and other germs to grow and cause infection. Sinusitis can develop quickly and last only a short time (acute) or continue over a long period (chronic). Sinusitis that lasts for more than 12 weeks is considered chronic. CAUSES Causes of sinusitis include:  Allergies.  Structural abnormalities, such as displacement of the cartilage that separates your nostrils (deviated septum), which can decrease the air flow through your nose and sinuses and affect sinus drainage.  Functional abnormalities, such as when the small hairs (cilia) that line your sinuses and help remove mucus do not work properly or are not present. SIGNS AND SYMPTOMS Symptoms of acute and chronic sinusitis are the same. The primary symptoms are pain and pressure around the affected sinuses. Other symptoms include:  Upper toothache.  Earache.  Headache.  Bad breath.  Decreased sense of smell and taste.  A cough, which worsens when you are lying flat.  Fatigue.  Fever.  Thick drainage from your nose, which often is green and may contain pus (purulent).  Swelling and warmth over the affected sinuses. DIAGNOSIS Your health care provider will perform a  physical exam. During your exam, your health care provider may perform any of the following to help determine if you have acute sinusitis or chronic sinusitis:  Look in your nose for signs of abnormal growths in your nostrils (nasal polyps).  Tap over the affected sinus to check for signs of infection.  View the inside of your sinuses using an imaging device that has a light attached (endoscope). If your health care provider suspects that you have chronic sinusitis, one or more of the following tests may be recommended:  Allergy tests.  Nasal culture. A sample of mucus is taken from your nose, sent to a lab, and screened for bacteria.  Nasal cytology. A sample of mucus is taken from your nose and examined by your health care provider to determine if your sinusitis is related to an allergy. TREATMENT Most cases of acute sinusitis are related to a viral infection and will resolve on their own within 10 days. Sometimes, medicines are prescribed to help relieve symptoms of both acute and chronic sinusitis. These may include pain medicines, decongestants, nasal steroid sprays, or saline sprays. However, for sinusitis related to a bacterial infection, your health care provider will prescribe antibiotic medicines. These are medicines that will help kill the bacteria causing the infection. Rarely, sinusitis is caused by a fungal infection. In these cases, your health care provider will prescribe antifungal medicine.  For some cases of chronic sinusitis, surgery is needed. Generally, these are cases in which sinusitis recurs more than 3 times per year, despite other treatments. HOME CARE INSTRUCTIONS  Drink plenty of water. Water helps thin the mucus so your sinuses can drain more easily.  Use a humidifier.  Inhale steam 3-4 times a day (for example, sit in the bathroom with the shower running).  Apply a warm, moist washcloth to your face 3-4 times a day, or as directed by your health care  provider.  Use saline nasal sprays to help moisten and clean your sinuses.  Take medicines only as directed by your health care provider.  If you were prescribed either an antibiotic or antifungal medicine, finish it all even if you start to feel better. SEEK IMMEDIATE MEDICAL CARE IF:  You have increasing pain or severe headaches.  You have nausea, vomiting, or drowsiness.  You have swelling around your face.  You have vision problems.  You have a stiff neck.  You have difficulty breathing.   This information is not intended to replace advice given to you by your health care provider. Make sure you discuss any questions you have with your health care provider.   Document Released: 11/09/2005 Document Revised: 11/30/2014 Document Reviewed: 11/24/2011 Elsevier Interactive Patient Education [employer] Hysteroscopy Hysteroscopy is a procedure used for looking inside the womb (uterus). It may be done for various reasons, including:  To evaluate abnormal bleeding, fibroid (benign, noncancerous) tumors, polyps, scar tissue (adhesions), and possibly cancer of the uterus.  To look for lumps (tumors) and other uterine growths.  To look for causes of why a woman cannot get pregnant (infertility), causes of recurrent loss of pregnancy (miscarriages), or a lost intrauterine device (IUD).  To perform a sterilization by blocking the fallopian tubes from inside the uterus. In this procedure, a thin, flexible tube with a tiny light and camera on the end of it (hysteroscope) is used to look inside the uterus. A hysteroscopy should be done right after a menstrual period to be sure you are not pregnant. LET [facility] CARE PROVIDER KNOW ABOUT:   Any allergies you have.  All medicines you are taking, including vitamins, herbs, eye drops, creams, and over-the-counter medicines.  Previous problems you or members of your family have had with the use of anesthetics.  Any blood disorders  you have.  Previous surgeries you have had.  Medical conditions you have. RISKS AND COMPLICATIONS  Generally, this is a safe procedure. However, as with any procedure, complications can occur. Possible complications include:  Putting a hole in the uterus.  Excessive bleeding.  Infection.  Damage to the cervix.  Injury to other organs.  Allergic reaction to medicines.  Too much fluid used in the uterus for the procedure. BEFORE THE PROCEDURE   Ask your health care provider about changing or stopping any regular medicines.  Do not take aspirin or blood thinners for 1 week before the procedure, or as directed by your health care provider. These can cause bleeding.  If you smoke, do not smoke for 2 weeks before the procedure.  In some cases, a medicine is placed in the cervix the day before the procedure. This medicine makes the cervix have a larger opening (dilate). This makes it easier for the instrument to be inserted into the uterus during the procedure.  Do not eat or drink anything for at least 8 hours before the surgery.  Arrange for someone to take you home after  the procedure. PROCEDURE   You may be given a medicine to relax you (sedative). You may also be given one of the following:  A medicine that numbs the area around the cervix (local anesthetic).  A medicine that makes you sleep through the procedure (general anesthetic).  The hysteroscope is inserted through the vagina into the uterus. The camera on the hysteroscope sends a picture to a TV screen. This gives the surgeon a good view inside the uterus.  During the procedure, air or a liquid is put into the uterus, which allows the surgeon to see better.  Sometimes, tissue is gently scraped from inside the uterus. These tissue samples are sent to a lab for testing. AFTER THE PROCEDURE   If you had a general anesthetic, you may be groggy for a couple hours after the procedure.  If you had a local anesthetic,  you will be able to go home as soon as you are stable and feel ready.  You may have some cramping. This normally lasts for a couple days.  You may have bleeding, which varies from light spotting for a few days to menstrual-like bleeding for 3-7 days. This is normal.  If your test results are not back during the visit, make an appointment with your health care provider to find out the results.   This information is not intended to replace advice given to you by your health care provider. Make sure you discuss any questions you have with your health care provider.   Document Released: 02/15/2001 Document Revised: 08/30/2013 Document Reviewed: 06/08/2013 Elsevier Interactive Patient Education [employer]

## 2015-10-11 NOTE — Telephone Encounter (Signed)
Patient called to ask what her appointment was for today. She is sick and may not want to come if not necessary. Her surgery is Tuesday and it is her pre op consult appt. I called her back and she said she has a cold. Coughing until she gets sick on her stomach. I encouraged her to keep her appointment and let Dr. Glenetta HewJF listen to her chest and prescribe something for cough or whatever he thinks she may need. She said she did feel like coming and would keep the appointment today.

## 2015-10-14 MED ORDER — GENTAMICIN SULFATE 40 MG/ML IJ SOLN: INTRAVENOUS | Status: DC

## 2015-10-14 NOTE — Telephone Encounter (Signed)
[name] to let me know she received my message and she took care of cancelling Dr. Glenetta HewJF pre op antibiotic order as he requested.

## 2015-10-15 ENCOUNTER — Encounter (HOSPITAL_COMMUNITY)
Admission: RE | Disposition: A | Discharge status: Home / Self Care | Payer: Self-pay | Source: Ambulatory Visit | Attending: Gynecology

## 2015-10-15 ENCOUNTER — Encounter (HOSPITAL_COMMUNITY): Payer: Self-pay | Admitting: Certified Registered Nurse Anesthetist

## 2015-10-15 ENCOUNTER — Ambulatory Visit (HOSPITAL_COMMUNITY): Payer: Medicare PPO | Admitting: Anesthesiology

## 2015-10-15 ENCOUNTER — Ambulatory Visit (HOSPITAL_COMMUNITY)
Admission: RE | Admit: 2015-10-15 | Discharge: 2015-10-15 | Disposition: A | Discharge status: Home / Self Care | Payer: Medicare PPO | Source: Ambulatory Visit | Attending: Gynecology | Admitting: Gynecology

## 2015-10-15 DIAGNOSIS — Z882 Allergy status to sulfonamides status: Secondary | ICD-10-CM | POA: Insufficient documentation

## 2015-10-15 DIAGNOSIS — Z87891 Personal history of nicotine dependence: Secondary | ICD-10-CM | POA: Diagnosis not present

## 2015-10-15 DIAGNOSIS — N840 Polyp of corpus uteri: Secondary | ICD-10-CM | POA: Insufficient documentation

## 2015-10-15 DIAGNOSIS — I4891 Unspecified atrial fibrillation: Secondary | ICD-10-CM | POA: Diagnosis not present

## 2015-10-15 DIAGNOSIS — I739 Peripheral vascular disease, unspecified: Secondary | ICD-10-CM | POA: Diagnosis not present

## 2015-10-15 DIAGNOSIS — N950 Postmenopausal bleeding: Secondary | ICD-10-CM | POA: Diagnosis not present

## 2015-10-15 DIAGNOSIS — Z881 Allergy status to other antibiotic agents status: Secondary | ICD-10-CM | POA: Diagnosis not present

## 2015-10-15 HISTORY — PX: DILATATION & CURETTAGE/HYSTEROSCOPY WITH MYOSURE: SHX6511

## 2015-10-15 SURGERY — DILATATION & CURETTAGE/HYSTEROSCOPY WITH MYOSURE
Anesthesia: General | Site: Vagina

## 2015-10-15 MED ORDER — PROPOFOL 10 MG/ML IV BOLUS
INTRAVENOUS | Status: DC | PRN
  Administered 2015-10-15: 150 mg via INTRAVENOUS

## 2015-10-15 MED ORDER — DEXAMETHASONE SODIUM PHOSPHATE 10 MG/ML IJ SOLN
INTRAMUSCULAR | Status: DC | PRN
  Administered 2015-10-15: 4 mg via INTRAVENOUS

## 2015-10-15 MED ORDER — LACTATED RINGERS IV SOLN
INTRAVENOUS | Status: DC
  Administered 2015-10-15: 10:00:00 via INTRAVENOUS

## 2015-10-15 MED ORDER — ONDANSETRON HCL 4 MG/2ML IJ SOLN
INTRAMUSCULAR | Status: DC | PRN
Start: 2015-10-15 — End: 2015-10-15
  Administered 2015-10-15: 4 mg via INTRAVENOUS

## 2015-10-15 MED ORDER — ONDANSETRON HCL 4 MG/2ML IJ SOLN
INTRAMUSCULAR | Status: AC
  Filled 2015-10-15: qty 2

## 2015-10-15 MED ORDER — SODIUM CHLORIDE 0.9 % IJ SOLN
INTRAMUSCULAR | Status: AC
  Filled 2015-10-15: qty 50

## 2015-10-15 MED ORDER — MIDAZOLAM HCL 2 MG/2ML IJ SOLN
INTRAMUSCULAR | Status: DC | PRN
  Administered 2015-10-15 (×2): 1 mg via INTRAVENOUS

## 2015-10-15 MED ORDER — LIDOCAINE HCL (CARDIAC) 20 MG/ML IV SOLN
INTRAVENOUS | Status: AC
  Filled 2015-10-15: qty 5

## 2015-10-15 MED ORDER — KETOROLAC TROMETHAMINE 30 MG/ML IJ SOLN
INTRAMUSCULAR | Status: AC
  Filled 2015-10-15: qty 1

## 2015-10-15 MED ORDER — SCOPOLAMINE 1 MG/3DAYS TD PT72: 1.0000 | MEDICATED_PATCH | Freq: Once | TRANSDERMAL | Status: DC

## 2015-10-15 MED ORDER — FENTANYL CITRATE (PF) 100 MCG/2ML IJ SOLN
INTRAMUSCULAR | Status: AC
  Filled 2015-10-15: qty 2

## 2015-10-15 MED ORDER — MIDAZOLAM HCL 2 MG/2ML IJ SOLN
INTRAMUSCULAR | Status: AC
  Filled 2015-10-15: qty 2

## 2015-10-15 MED ORDER — SODIUM CHLORIDE 0.9 % IR SOLN
Status: DC | PRN
  Administered 2015-10-15: 3000 mL

## 2015-10-15 MED ORDER — LIDOCAINE HCL (CARDIAC) 20 MG/ML IV SOLN
INTRAVENOUS | Status: DC | PRN
  Administered 2015-10-15: 60 mg via INTRAVENOUS

## 2015-10-15 MED ORDER — VASOPRESSIN 20 UNIT/ML IV SOLN
INTRAVENOUS | Status: AC
  Filled 2015-10-15: qty 1

## 2015-10-15 MED ORDER — MEPERIDINE HCL 25 MG/ML IJ SOLN: 6.2500 mg | INTRAMUSCULAR | Status: DC | PRN

## 2015-10-15 MED ORDER — FENTANYL CITRATE (PF) 100 MCG/2ML IJ SOLN
INTRAMUSCULAR | Status: DC | PRN
  Administered 2015-10-15: 50 ug via INTRAVENOUS
  Administered 2015-10-15 (×2): 25 ug via INTRAVENOUS

## 2015-10-15 MED ORDER — KETOROLAC TROMETHAMINE 30 MG/ML IJ SOLN
INTRAMUSCULAR | Status: DC | PRN
  Administered 2015-10-15: 30 mg via INTRAVENOUS

## 2015-10-15 MED ORDER — PROMETHAZINE HCL 25 MG/ML IJ SOLN: 6.2500 mg | INTRAMUSCULAR | Status: DC | PRN

## 2015-10-15 MED ORDER — FENTANYL CITRATE (PF) 100 MCG/2ML IJ SOLN: 25.0000 ug | INTRAMUSCULAR | Status: DC | PRN

## 2015-10-15 MED ORDER — VASOPRESSIN 20 UNIT/ML IV SOLN
INTRAVENOUS | Status: DC | PRN
  Administered 2015-10-15: 10 [IU]

## 2015-10-15 MED ORDER — PROPOFOL 10 MG/ML IV BOLUS
INTRAVENOUS | Status: AC
  Filled 2015-10-15: qty 20

## 2015-10-15 MED ORDER — DEXAMETHASONE SODIUM PHOSPHATE 4 MG/ML IJ SOLN
INTRAMUSCULAR | Status: AC
  Filled 2015-10-15: qty 1

## 2015-10-15 SURGICAL SUPPLY — 24 items
CANISTERS HI-FLOW 3000CC (CANNISTER) ×2 IMPLANT
CATH FOLEY 2WAY SLVR 30CC 16FR (CATHETERS) IMPLANT
CATH ROBINSON RED A/P 16FR (CATHETERS) ×2 IMPLANT
CLOTH BEACON ORANGE TIMEOUT ST (SAFETY) ×2 IMPLANT
CONTAINER PREFILL 10% NBF 60ML (FORM) ×2 IMPLANT
DEVICE MYOSURE LITE (MISCELLANEOUS) IMPLANT
DEVICE MYOSURE REACH (MISCELLANEOUS) ×2 IMPLANT
FILTER ARTHROSCOPY CONVERTOR (FILTER) ×2 IMPLANT
GLOVE BIOGEL PI IND STRL 7.0 (GLOVE) ×1 IMPLANT
GLOVE BIOGEL PI IND STRL 8 (GLOVE) ×1 IMPLANT
GLOVE BIOGEL PI INDICATOR 7.0 (GLOVE) ×1
GLOVE BIOGEL PI INDICATOR 8 (GLOVE) ×1
GLOVE ECLIPSE 7.5 STRL STRAW (GLOVE) ×4 IMPLANT
GOWN STRL REUS W/TWL LRG LVL3 (GOWN DISPOSABLE) ×4 IMPLANT
PACK VAGINAL MINOR WOMEN LF (CUSTOM PROCEDURE TRAY) ×2 IMPLANT
PAD OB MATERNITY 4.3X12.25 (PERSONAL CARE ITEMS) ×2 IMPLANT
PAD PREP 24X48 CUFFED NSTRL (MISCELLANEOUS) ×2 IMPLANT
PLUG CATH AND CAP STER (CATHETERS) IMPLANT
SEAL ROD LENS SCOPE MYOSURE (ABLATOR) ×2 IMPLANT
SYR 30ML LL (SYRINGE) IMPLANT
TOWEL OR 17X24 6PK STRL BLUE (TOWEL DISPOSABLE) ×4 IMPLANT
TUBING AQUILEX INFLOW (TUBING) ×2 IMPLANT
TUBING AQUILEX OUTFLOW (TUBING) ×2 IMPLANT
WATER STERILE IRR 1000ML POUR (IV SOLUTION) ×2 IMPLANT

## 2015-10-15 NOTE — H&P (View-Only) (Signed)
Felicia [name] is an 49 y.o. female is here today for preoperative examination and consultation. Patient scheduled for resectoscopic polypectomy next week as a result of endometrial polyp contributing to her postmenopausal bleeding. Her history is as follows: Patient was seen the office in August 20 of this year and the following was my note:  "Patient is a 49 year old gravida 3 para 2 Ab1 (1 ectopic/laparoscopic, 1 vaginal, 1 C-section) who was referred to our practice as a courtesy of her primary care physician Dr. Lynnea FerrierWarren Pickard as a result of patient's postmenopausal bleeding. Patient stated that her menarche was at the age of 49. She states she reached the menopause approximately 2 years ago. She first noticed a vaginal bleeding after she went to the bathroom to wipe. She then used a pad during the day and continued to notice bleeding. One night after she will go she notice her pad for blood as well. She's having some minimal spotting today. She also had complained of some cramping and low abdominal pressure. She has no GU or GI complaints. She had a normal Pap smear this year at her PCP office. Patient reports no family history of any gynecological malignancy. Patient had a colonoscopy in 2015 and had her mammogram this year. Patient states that she has never been on any hormone replacement therapy in the past."  On that office visit patient underwent an endometrial biopsy in the following was the report: Diagnosis Endometrium, biopsy, uterus - FRAGMENTS OF ENDOMETRIAL-TYPE POLYP. - BACKGROUND HIGHLY FRAGMENTED BENIGN ENDOMETRIAL TISSUE. - NO ATYPIA OR MALIGNANCY IDENTIFIED  Ultrasound/sono hysterogram 09/30/2015: Uterus measures 6.8 x 5.1 x 3.7 cm with endometrial stripe of 1.9 mm. Right and left ovary were normal. After the cervix was cleansed with Betadine solution a sterile catheter was introduced into the uterine cavity whereby in normal saline was instilled. It was noted at the right  posterior fundal region there was a 14 x 12 x 5 mm defect which what appears to be an endometrial polyp.  Patient was complaining today of postnasal drip frontal sinus tenderness and dry cough   Pertinent Gynecological History: Menses: post-menopausal Bleeding: post menopausal bleeding Contraception: post menopausal status DES exposure: denies Blood transfusions: none Sexually transmitted diseases: no past history Previous GYN Procedures: One vaginal delivery, one cesarean section, 1 laparoscopic procedure to remove right ectopic pregnancy and ovarian cyst  Last mammogram: normal Date: 2016 Last pap: normal Date: 2016 OB History: G 3, P2 A1 (right ectopic pregnancy)   Menstrual History: Menarche age: 89 Patient's last menstrual period was 12/26/2012.    Past Medical History  Diagnosis Date  . Asthma   . PUD (peptic ulcer disease)   . Anxiety   . Bipolar 1 disorder (HCC)   . Insomnia   . Hyperlipidemia   . Anemia   . Atrial fibrillation (HCC)   . Peripheral arterial disease (HCC)   . Heart murmur   . Shortness of breath dyspnea   . Pneumonia   . Depression   . Headache   . Arthritis   . Ectopic pregnancy     Past Surgical History  Procedure Laterality Date  . Shoulder surgery    . Nose surgery    . Cesarean section    . Nose surgery    . Back surgery    . Tonsillectomy    . Aorta - bilateral femoral artery bypass graft N/A 10/23/2014    Procedure: AORTOBIFEMORAL BYPASS GRAFT;  Surgeon: Chuck Hinthristopher S Dickson, MD;  Location: [facility] OR;  Service: Vascular;  Laterality: N/A;  . Abdominal aortagram N/A 09/12/2012    Procedure: ABDOMINAL AORTAGRAM;  Surgeon: Fransisco HertzBrian L Chen, MD;  [facility];  Service: Cardiovascular;  Laterality: N/A;  . Tubal ligation      Family History  Problem Relation Age of Onset  . Heart disease Mother   . Diabetes Mother   . Hyperlipidemia Mother   . Hypertension Mother   . Heart attack Mother   . Heart disease Father   . Diabetes  Father   . Heart attack Father   . Hypertension Father   . Diabetes Brother     Social History:  reports that she quit smoking about a year ago. Her smoking use included Cigarettes. She has a 7.5 pack-year smoking history. She has never used smokeless tobacco. She reports that she drinks about 0.6 oz of alcohol per week. She reports that she does not use illicit drugs.  Allergies:  Allergies  Allergen Reactions  . Bactrim [Sulfamethoxazole-Trimethoprim] Hives  . Bee Pollen Swelling  . Cephalosporins Hives  . Codeine Nausea Only  . Doxycycline Hives  . Tetracyclines & Related Hives  . Reflex Pain Relief [Menthol] Rash    Muscle Rub     (Not in a hospital admission)  REVIEW OF SYSTEMS: A ROS was performed and pertinent positives and negatives are included in the history.  GENERAL: No fevers or chills. HEENT: No change in vision, no earache, sore throat or sinus congestion. NECK: No pain or stiffness. CARDIOVASCULAR: No chest pain or pressure. No palpitations. PULMONARY: No shortness of breath, cough or wheeze. GASTROINTESTINAL: No abdominal pain, nausea, vomiting or diarrhea, melena or bright red blood per rectum. GENITOURINARY: No urinary frequency, urgency, hesitancy or dysuria. MUSCULOSKELETAL: No joint or muscle pain, no back pain, no recent trauma. DERMATOLOGIC: No rash, no itching, no lesions. ENDOCRINE: No polyuria, polydipsia, no heat or cold intolerance. No recent change in weight. HEMATOLOGICAL: No anemia or easy bruising or bleeding. NEUROLOGIC: No headache, seizures, numbness, tingling or weakness. PSYCHIATRIC: No depression, no loss of interest in normal activity or change in sleep pattern.     Blood pressure 136/88, temperature 97.7 F (36.5 C), last menstrual period 12/26/2012.  Physical Exam:  HEENT: Tender frontal sinuses  Neck:Supple, midline, no thyroid megaly, no carotid bruits Lungs:  Clear to auscultation no rhonchi's or wheezes Heart:Regular rate and rhythm,  no murmurs or gallops Breast Exam: Symmetrical in appearance no palpable masses or tenderness no supraclavicular axillary lymphadenopathy  Abdomen: Soft nontender no rebound or guarding Pelvic:BUS within normal limits with atrophic changes Vagina: No lesions or discharge atrophic changes Cervix: Same as above Uterus: Retroverted normal size shape and consistency Adnexa: No palpable mass or tenderness Extremities: No cords, no edema Rectal: Not examined   Assessment/Plan: Postmenopausal patient with vaginal bleeding attributed to endometrial polyp. Preoperative examination consisting of an endometrial biopsy which confirmed endometrial-type polyp and benign endometrial tissue. Sonohysterogram confirmed also an endometrial polyp. Patient is scheduled for outpatient resectoscopic polypectomy next week the following risk benefits and pros and cons from her surgery were discussed with the patient questions answered:                        Patient was counseled as to the risk of surgery to include the following:  1. Infection (prohylactic antibiotics will be administered)  2. DVT/Pulmonary Embolism (prophylactic pneumo compression stockings will be used)  3.Trauma to internal organs requiring additional surgical procedure to repair any  injury to     Internal organs requiring perhaps additional hospitalization days.  4.Hemmorhage requiring transfusion and blood products which carry risks such as             anaphylactic reaction, hepatitis and AIDS  Patient had received literature information on the procedure scheduled and all her questions were answered and fully accepts all risk.  For patient's acute sinusitis she's going to be placed on Augmentin 875 mg 1 by mouth twice a day for 7 days.   FERNANDEZ,JUAN HMD3:36 PMTD@Note:  

## 2015-10-15 NOTE — Anesthesia Procedure Notes (Signed)
Procedure Name: LMA Insertion Date/Time: 10/15/2015 10:23 AM Performed by: Yolonda KidaARVER, Rmani Kapusta L Pre-anesthesia Checklist: Patient identified, Emergency Drugs available, Suction available and Patient being monitored Patient Re-evaluated:Patient Re-evaluated prior to inductionOxygen Delivery Method: Circle system utilized Preoxygenation: Pre-oxygenation with 100% oxygen Intubation Type: IV induction Ventilation: Mask ventilation without difficulty LMA: LMA inserted LMA Size: 4.0 Number of attempts: 1 Placement Confirmation: positive ETCO2,  CO2 detector and breath sounds checked- equal and bilateral Tube secured with: Tape Dental Injury: Teeth and Oropharynx as per pre-operative assessment

## 2015-10-15 NOTE — Anesthesia Postprocedure Evaluation (Signed)
Anesthesia Post Note  Patient: Felicia [name]  Procedure(s) Performed: Procedure(s) (LRB): DILATATION & CURETTAGE/HYSTEROSCOPY WITH MYOSURE (N/A)  Patient location during evaluation: PACU Anesthesia Type: General Level of consciousness: awake and alert Pain management: pain level controlled Vital Signs Assessment: post-procedure vital signs reviewed and stable Respiratory status: spontaneous breathing, nonlabored ventilation, respiratory function stable and patient connected to nasal cannula oxygen Cardiovascular status: blood pressure returned to baseline and stable Postop Assessment: No signs of nausea or vomiting Anesthetic complications: no    Last Vitals:  Filed Vitals:   10/15/15 0930 10/15/15 0933  BP: 121/45 117/75  Pulse: 81   Temp: 36.6 C     Last Pain: There were no vitals filed for this visit.               Jahron Hunsinger

## 2015-10-15 NOTE — Discharge Instructions (Signed)
DISCHARGE INSTRUCTIONS: HYSTEROSCOPY / ENDOMETRIAL ABLATION The following instructions have been prepared to help you care for yourself upon your return home.   May take Ibuprofen after 4:45 PM as needed for cramping   Personal hygiene:  Use sanitary pads for vaginal drainage, not tampons.  Shower the day after your procedure.  NO tub baths, pools or Jacuzzis for 2-3 weeks.  Wipe front to back after using the bathroom.  Activity and limitations:  Do NOT drive or operate any equipment for 24 hours. The effects of anesthesia are still present and drowsiness may result.  Do NOT rest in bed all day.  Walking is encouraged.  Walk up and down stairs slowly.  You may resume your normal activity in one to two days or as indicated by your physician.  Sexual activity: NO intercourse for at least 2 weeks after the procedure, or as indicated by your Doctor.  Diet: Eat a light meal as desired this evening. You may resume your usual diet tomorrow.  Return to Work: You may resume your work activities in one to two days or as indicated by Therapist, sportsyour Doctor.  What to expect after your surgery: Expect to have vaginal bleeding/discharge for 2-3 days and spotting for up to 10 days. It is not unusual to have soreness for up to 1-2 weeks. You may have a slight burning sensation when you urinate for the first day. Mild cramps may continue for a couple of days. You may have a regular period in 2-6 weeks.  Call your doctor for any of the following:  Excessive vaginal bleeding or clotting, saturating and changing one pad every hour.  Inability to urinate 6 hours after discharge from hospital.  Pain not relieved by pain medication.  Fever of 100.4 F or greater.  Unusual vaginal discharge or odor.

## 2015-10-15 NOTE — Interval H&P Note (Signed)
History and Physical Interval Note:  10/15/2015 10:13 AM  Felicia [name]  has presented today for surgery, with the diagnosis of post menopausal bleeding, endometrial polyp  The various methods of treatment have been discussed with the patient and family. After consideration of risks, benefits and other options for treatment, the patient has consented to  Procedure(s): DILATATION & CURETTAGE/HYSTEROSCOPY WITH MYOSURE (N/A) as a surgical intervention .  The patient's history has been reviewed, patient examined, no change in status, stable for surgery.  I have reviewed the patient's chart and labs.  Questions were answered to the patient's satisfaction.     Ok EdwardsFERNANDEZ,JUAN H

## 2015-10-15 NOTE — Op Note (Signed)
   Operative Note  10/15/2015  11:08 AM  PATIENT:  Felicia [name]  49 y.o. female  PRE-OPERATIVE DIAGNOSIS:  post menopausal bleeding, endometrial polyp  POST-OPERATIVE DIAGNOSIS:  post menopausal bleeding, endometrial polyp  PROCEDURE:  Procedure(s): DILATATION & CURETTAGE/HYSTEROSCOPY WITH MYOSURE  SURGEON:  Surgeon(s): Ok EdwardsJuan H Javari Bufkin, MD  ANESTHESIA:   general  FINDINGS: Endometrial polyp extending from the anterior left fundal region to the level of the endocervical canal no other abnormality was noted. Smooth endocervical canal both tubal ostia identified  DESCRIPTION OF OPERATION:FINDINGS: The patient was taken to the operating room where she underwent a successful general endotracheal anesthesia. Patient had PAS stockings for DVT prophylaxis. Patient had been on Augmentin since last week for a sinusitis so no additional antibiotic was administered today. Time out was undertaken to properly identify the patient and the proper operation schedule. The vagina and perineum were prepped and draped in usual sterile fashion. A red rubber Roxan HockeyRobinson was inserted to empty the bladder is content for approximately 50 cc. Bimanual examination demonstrated an anteverted uterus. Patient's legs were in the high lithotomy position. A weighted speculum was placed in the posterior vaginal vault. A single-tooth tenaculum was placed on the anterior cervical lip. The uterus sounded to 7-1/2 cm. Pratt dilator were used to dilate the cervical canal to a 15 mm size. Pitressin was infiltrated at the cervical stroma at the 2, 4, 8, and 10:00 position for total 10 cc. The Hologic Myosure resectoscopic morcellator with a scope of 6.25 mm and an operating blade of 3.0 mm was introduced into the intrauterine cavity. 0.9% normal saline was the distending media. Inspection of the endometrial cavity demonstrated the single endometrial polyp attached to the anterior left uterine fundal wall extending to the cervical  canal. With the resectoscopic morcellator they were removed and submitted for histological evaluation. Fluid deficit was approximately 90 cc. The single-tooth tenaculum was removed patient was extubated transferred to recovery room stable vital signs blood loss was minimal. She received 30 mg of Toradol in route to the recovery room.    ESTIMATED BLOOD LOSS: Minimal   Intake/Output Summary (Last 24 hours) at 10/15/15 1108 Last data filed at 10/15/15 1047  Gross per 24 hour  Intake    800 ml  Output     32 ml  Net    768 ml     BLOOD ADMINISTERED:none   LOCAL MEDICATIONS USED:  OTHER Pitressin 10 units in 30 cc of normal saline dilution. 10 cc were infiltrated into the cervical stroma at the 2, 4, 8 and 10:00 position  SPECIMEN:  Source of Specimen:  Endometrial polyp  DISPOSITION OF SPECIMEN:  PATHOLOGY  COUNTS:  YES  PLAN OF CARE: Transfer to PACU  [facility]A[place]re [place] HMD11:08 AMTD@

## 2015-10-15 NOTE — Transfer of Care (Signed)
Immediate Anesthesia Transfer of Care Note  Patient: Felicia [name]  Procedure(s) Performed: Procedure(s): DILATATION & CURETTAGE/HYSTEROSCOPY WITH MYOSURE (N/A)  Patient Location: PACU  Anesthesia Type:General  Level of Consciousness: awake, alert , oriented and patient cooperative  Airway & Oxygen Therapy: Patient Spontanous Breathing and Patient connected to nasal cannula oxygen  Post-op Assessment: Report given to RN and Post -op Vital signs reviewed and stable  Post vital signs: Reviewed and stable  Last Vitals:  Filed Vitals:   10/15/15 0930 10/15/15 0933  BP: 121/45 117/75  Pulse: 81   Temp: 36.6 C     Complications: No apparent anesthesia complications

## 2015-10-16 ENCOUNTER — Encounter (HOSPITAL_COMMUNITY): Payer: Self-pay | Admitting: Gynecology

## 2015-10-21 ENCOUNTER — Other Ambulatory Visit: Payer: Self-pay | Admitting: Family Medicine

## 2015-10-21 NOTE — Telephone Encounter (Signed)
?   OK to Refill  

## 2015-10-21 NOTE — Telephone Encounter (Signed)
ok 

## 2015-10-22 NOTE — Telephone Encounter (Signed)
rx called in

## 2015-10-31 ENCOUNTER — Ambulatory Visit (INDEPENDENT_AMBULATORY_CARE_PROVIDER_SITE_OTHER): Payer: Medicare PPO | Admitting: Gynecology

## 2015-10-31 ENCOUNTER — Encounter: Payer: Self-pay | Admitting: Gynecology

## 2015-10-31 VITALS — BP 126/82

## 2015-10-31 DIAGNOSIS — Z09 Encounter for follow-up examination after completed treatment for conditions other than malignant neoplasm: Secondary | ICD-10-CM

## 2015-10-31 DIAGNOSIS — J208 Acute bronchitis due to other specified organisms: Secondary | ICD-10-CM

## 2015-10-31 NOTE — Progress Notes (Signed)
   Patient is a 49 year old that presented to the office today for her two-week postop visit. Patient is status post resectoscopic uterine polypectomy as a result of postmenopausal bleeding secondary to large endometrial polyp on 10/15/2015. Patient reports no further bleeding and is otherwise done well from her surgery. Findings from surgery as well as pathology report and pictures were shared with the patient.  FINDINGS: Endometrial polyp extending from the anterior left fundal region to the level of the endocervical canal no other abnormality was noted. Smooth endocervical canal both tubal ostia identified  Pathology report: Diagnosis Endometrial polyp - ENDOMETRIOID-TYPE POLYP(S). - THERE IS NO EVIDENCE OF HYPERPLASIA OR MALIGNANCY  Exam: Abdomen: Soft nontender no rebound or guarding Pelvic: Bartholin urethra Skene was within normal limits Vagina: No lesions or discharge. Cervix: No lesions or discharge Uterus: Anteverted normal size shape and consistency Adnexa: No palpable masses or tenderness Rectal exam: Not done  Assessment/plan: 49 year old status post resectoscopic uterine polypectomy for endometrial polyp contributing to postmenopausal bleeding noted well from her surgery. Pathology report benign. Patient will return back to her PCP for her annual exam or to see us on a when necessary basis.

## 2015-11-19 ENCOUNTER — Encounter: Payer: Self-pay | Admitting: Family Medicine

## 2015-12-05 DIAGNOSIS — M25561 Pain in right knee: Secondary | ICD-10-CM

## 2015-12-05 HISTORY — DX: Pain in right knee: M25.561

## 2015-12-06 ENCOUNTER — Other Ambulatory Visit: Payer: Self-pay | Admitting: Family Medicine

## 2015-12-06 NOTE — Telephone Encounter (Signed)
LRF 10/22/15 # 60 + 1  LOV 10/03/15  Ok refill?

## 2015-12-09 ENCOUNTER — Telehealth: Payer: Self-pay | Admitting: *Deleted

## 2015-12-09 MED ORDER — TRAMADOL HCL 50 MG PO TABS: ORAL_TABLET | ORAL | Status: DC

## 2015-12-09 NOTE — Telephone Encounter (Signed)
Medication called to pharmacy. 

## 2015-12-09 NOTE — Telephone Encounter (Signed)
Received call from patient.   Requested refill on tramadol.   Ok to refill??  Last office visit 10/03/2015.  Last refill 10/22/2015,. #1 refill.

## 2015-12-09 NOTE — Telephone Encounter (Signed)
ok 

## 2015-12-16 ENCOUNTER — Encounter: Payer: Self-pay | Admitting: Vascular Surgery

## 2015-12-18 ENCOUNTER — Ambulatory Visit (HOSPITAL_COMMUNITY)
Admission: RE | Admit: 2015-12-18 | Discharge: 2015-12-18 | Disposition: A | Discharge status: Home / Self Care | Payer: Medicare Other | Source: Ambulatory Visit | Attending: Vascular Surgery | Admitting: Vascular Surgery

## 2015-12-18 DIAGNOSIS — Z48812 Encounter for surgical aftercare following surgery on the circulatory system: Secondary | ICD-10-CM | POA: Diagnosis present

## 2015-12-18 DIAGNOSIS — E785 Hyperlipidemia, unspecified: Secondary | ICD-10-CM | POA: Diagnosis not present

## 2015-12-18 DIAGNOSIS — Z9889 Other specified postprocedural states: Secondary | ICD-10-CM | POA: Diagnosis not present

## 2015-12-25 ENCOUNTER — Ambulatory Visit (INDEPENDENT_AMBULATORY_CARE_PROVIDER_SITE_OTHER): Payer: Medicare Other | Admitting: Vascular Surgery

## 2015-12-25 ENCOUNTER — Encounter: Payer: Self-pay | Admitting: Vascular Surgery

## 2015-12-25 VITALS — BP 147/70 | HR 103 | Temp 98.2°F | Resp 18 | Ht <= 58 in | Wt 138.3 lb

## 2015-12-25 DIAGNOSIS — Z48812 Encounter for surgical aftercare following surgery on the circulatory system: Secondary | ICD-10-CM | POA: Diagnosis not present

## 2015-12-25 NOTE — Addendum Note (Signed)
Addended by: Adria Dill L on: 12/25/2015 02:57 PM   Modules accepted: Orders

## 2015-12-25 NOTE — Progress Notes (Signed)
[facility] [place]  Patient name: Felicia [name] MRN: [id number] DOB: 1966/04/25 Sex: female  REASON FOR VISIT: Peripheral vascular disease.  HPI: Felicia [name] is a 50 y.o. female who was last seen in our office on 12/12/2014. She is status post aorto bifemoral bypass graft. At that time she could walk up to 5 miles per day without symptoms. She had quit smoking. She comes in for a one-year follow up visit.   She denies any claudication or rest pain. She has been under a lot of stress that she lost several family members and had started smoking again although she's been off cigarettes for 5 days now. She has no specific complaints.   Past Medical History  Diagnosis Date  . Asthma   . PUD (peptic ulcer disease)   . Anxiety   . Bipolar 1 disorder (HCC)   . Insomnia   . Hyperlipidemia   . Anemia   . Atrial fibrillation (HCC)   . Peripheral arterial disease (HCC)   . Heart murmur   . Shortness of breath dyspnea   . Pneumonia   . Depression   . Headache   . Arthritis   . Ectopic pregnancy   . Right knee pain 12-05-2015    per pt.  she fell out of her husband's truck and landed on her right knee    Family History  Problem Relation Age of Onset  . Heart disease Mother   . Diabetes Mother   . Hyperlipidemia Mother   . Hypertension Mother   . Heart attack Mother   . Heart disease Father   . Diabetes Father   . Heart attack Father   . Hypertension Father   . Diabetes Brother     SOCIAL HISTORY: Social History  Substance Use Topics  . Smoking status: Former Smoker -- 0.25 packs/day for 30 years    Types: Cigarettes    Quit date: 10/19/2014  . Smokeless tobacco: Never Used     Comment: pt states that she is dealing with some stressful events in her life and as a result has began smoking about 3-4 cigs per day  . Alcohol Use: 0.6 oz/week    1 Shots of liquor, 0 Standard drinks or equivalent per week     Comment: very occassionally on social  occassions    Allergies  Allergen Reactions  . Augmentin [Amoxicillin-Pot Clavulanate] Diarrhea  . Bactrim [Sulfamethoxazole-Trimethoprim] Hives  . Bee Pollen Swelling  . Cephalosporins Hives  . Codeine Nausea Only  . Doxycycline Hives  . Tetracyclines & Related Hives  . Reflex Pain Relief [Menthol] Rash    Muscle Rub    Current Outpatient Prescriptions  Medication Sig Dispense Refill  . albuterol (PROVENTIL HFA;VENTOLIN HFA) 108 (90 BASE) MCG/ACT inhaler Inhale 2 puffs into the lungs every 6 (six) hours as needed. For shortness of breath 18 g 3  . aspirin 81 MG tablet Take 81 mg by mouth daily.    Marland Kitchen atorvastatin (LIPITOR) 80 MG tablet Take 1 tablet (80 mg total) by mouth daily. 30 tablet 6  . beclomethasone (QVAR) 80 MCG/ACT inhaler Inhale 2 puffs into the lungs 2 (two) times daily.    . clonazePAM (KLONOPIN) 1 MG tablet Take 1 tablet (1 mg total) by mouth 3 (three) times daily. 90 tablet 2  . cyclobenzaprine (FLEXERIL) 10 MG tablet TAKE 1 TABLET BY MOUTH 3 TIMES A DAY AS NEEDED FOR MUSCLE SPASMS 90 tablet 2  . diphenhydrAMINE (BENADRYL) 50 MG  capsule Take 50 mg by mouth every 6 (six) hours as needed for itching.    Marland Kitchen EPINEPHrine (EPI-PEN) 0.3 mg/0.3 mL SOAJ injection Inject 0.3 mLs (0.3 mg total) into the muscle once. 1 Device 2  . ibuprofen (ADVIL,MOTRIN) 200 MG tablet Take 400-600 mg by mouth every 6 (six) hours as needed for headache or moderate pain (for pain).     . Multiple Vitamin (MULTIVITAMIN) tablet Take 1 tablet by mouth daily.    . mupirocin cream (BACTROBAN) 2 % Apply 1 application topically 2 (two) times daily. 15 g 0  . neomycin-bacitracin-polymyxin (NEOSPORIN) ointment Apply 1 application topically as needed for wound care (spider (?) bite). apply to eye    . traMADol (ULTRAM) 50 MG tablet TAKE 1 TO 2 TABLETS BY MOUTH 3 TIMES A DAY AS NEEDED 60 tablet 1  . amoxicillin-clavulanate (AUGMENTIN) 875-125 MG tablet Take 1 tablet by mouth 2 (two) times daily. (Patient not  taking: Reported on 12/25/2015) 14 tablet 0  . divalproex (DEPAKOTE ER) 500 MG 24 hr tablet TAKE 2 TABLETS BY MOUTH EVERY DAY AT BEDTIME (Patient not taking: Reported on 12/25/2015) 180 tablet 0   No current facility-administered medications for this visit.    REVIEW OF SYSTEMS:  [X]  denotes positive finding, [ ]  denotes negative finding Cardiac  Comments:  Chest pain or chest pressure:    Shortness of breath upon exertion:    Short of breath when lying flat:    Irregular heart rhythm:        Vascular    Pain in calf, thigh, or hip brought on by ambulation:    Pain in feet at night that wakes you up from your sleep:     Blood clot in your veins:    Leg swelling:         Pulmonary    Oxygen at home:    Productive cough:     Wheezing:         Neurologic    Sudden weakness in arms or legs:     Sudden numbness in arms or legs:     Sudden onset of difficulty speaking or slurred speech:    Temporary loss of vision in one eye:     Problems with dizziness:         Gastrointestinal    Blood in stool:     Vomited blood:         Genitourinary    Burning when urinating:     Blood in urine:        Psychiatric    Major depression:         Hematologic    Bleeding problems:    Problems with blood clotting too easily:        Skin    Rashes or ulcers:        Constitutional    Fever or chills:      PHYSICAL EXAM: Filed Vitals:   12/25/15 1147 12/25/15 1150  BP: 147/80 147/70  Pulse: 103   Temp: 98.2 F (36.8 C)   TempSrc: Oral   Resp: 18   Height: 4\' 9"  (1.448 m)   Weight: 138 lb 4.8 oz (62.732 kg)   SpO2: 96%     GENERAL: The patient is a well-nourished female, in no acute distress. The vital signs are documented above. CARDIAC: There is a regular rate and rhythm.  VASCULAR: I do not detect carotid bruits. She has palpable femoral pulses. She has a palpable right dorsalis pedis pulse  in the left posterior tibial pulse. PULMONARY: There is good air exchange  bilaterally without wheezing or rales. ABDOMEN: Soft and non-tender with normal pitched bowel sounds.  MUSCULOSKELETAL: There are no major deformities or cyanosis. NEUROLOGIC: No focal weakness or paresthesias are detected. SKIN: There are no ulcers or rashes noted. PSYCHIATRIC: The patient has a normal affect.  DATA:   LOWER EXTREMITY ARTERIAL DOPPLER: Elberta Fortis interpreted her lower extremity arterial Doppler study that was done on 12/18/2015. On the right side she has a triphasic posterior tibial signal and a biphasic dorsalis pedis signal. ABI is 86%. On the left side she has a triphasic posterior tibial signal and a biphasic dorsalis pedis signal ABI is 84%.  MEDICAL ISSUES:  The patient is doing well status post aortobifemoral bypass graft. She is walking up to 5 miles a day. She has started smoking again but just quit again. I encouraged her to stay as active as possible and try to stay off cigarettes completely. I'll order a follow up ABIs in 18 months and I will see her back at that time. He is to call sooner if she has problems.   Waverly Ferrari [facility] [place]: [phone]

## 2015-12-25 NOTE — Progress Notes (Signed)
Filed Vitals:   12/25/15 1147 12/25/15 1150  BP: 147/80 147/70  Pulse: 103   Temp: 98.2 F (36.8 C)   TempSrc: Oral   Resp: 18   Height:  (1.448 m)   Weight: 138 lb 4.8 oz (62.732 kg)   SpO2: 96%

## 2015-12-27 ENCOUNTER — Other Ambulatory Visit: Payer: Self-pay | Admitting: Family Medicine

## 2015-12-27 NOTE — Telephone Encounter (Signed)
ok 

## 2015-12-27 NOTE — Telephone Encounter (Signed)
LRF 10/01/15 #90 + 2  LOV 11/101/16  OK refill?

## 2015-12-27 NOTE — Telephone Encounter (Signed)
Medication refilled per protocol. 

## 2015-12-31 ENCOUNTER — Ambulatory Visit (INDEPENDENT_AMBULATORY_CARE_PROVIDER_SITE_OTHER): Payer: Medicare Other | Admitting: Family Medicine

## 2015-12-31 ENCOUNTER — Encounter: Payer: Self-pay | Admitting: Family Medicine

## 2015-12-31 VITALS — BP 136/82 | HR 96 | Temp 98.2°F | Resp 18 | Ht <= 58 in | Wt 133.0 lb

## 2015-12-31 DIAGNOSIS — Z0000 Encounter for general adult medical examination without abnormal findings: Secondary | ICD-10-CM | POA: Diagnosis not present

## 2015-12-31 DIAGNOSIS — Z1211 Encounter for screening for malignant neoplasm of colon: Secondary | ICD-10-CM

## 2015-12-31 DIAGNOSIS — I2510 Atherosclerotic heart disease of native coronary artery without angina pectoris: Secondary | ICD-10-CM

## 2015-12-31 DIAGNOSIS — J441 Chronic obstructive pulmonary disease with (acute) exacerbation: Secondary | ICD-10-CM

## 2015-12-31 LAB — LIPID PANEL
CHOL/HDL RATIO: 4.6 ratio (ref <=5.0)
CHOLESTEROL: 170 mg/dL (ref 125–200)
HDL: 37 mg/dL — ABNORMAL LOW (ref >=46)
LDL Cholesterol: 97 mg/dL (ref <130)
Triglycerides: 178 mg/dL — ABNORMAL HIGH (ref <150)
VLDL: 36 mg/dL — ABNORMAL HIGH (ref <30)

## 2015-12-31 LAB — CBC WITH DIFFERENTIAL/PLATELET
Basophils Absolute: 0.1 10*3/uL (ref 0.0–0.1)
Basophils Relative: 1 % (ref 0–1)
Eosinophils Absolute: 0.1 10*3/uL (ref 0.0–0.7)
Eosinophils Relative: 1 % (ref 0–5)
HEMATOCRIT: 43.7 % (ref 36.0–46.0)
HEMOGLOBIN: 14.7 g/dL (ref 12.0–15.0)
LYMPHS PCT: 30 % (ref 12–46)
Lymphs Abs: 1.8 10*3/uL (ref 0.7–4.0)
MCH: 30.6 pg (ref 26.0–34.0)
MCHC: 33.6 g/dL (ref 30.0–36.0)
MCV: 91 fL (ref 78.0–100.0)
MONO ABS: 0.4 10*3/uL (ref 0.1–1.0)
MONOS PCT: 6 % (ref 3–12)
MPV: 9.2 fL (ref 8.6–12.4)
NEUTROS ABS: 3.7 10*3/uL (ref 1.7–7.7)
Neutrophils Relative %: 62 % (ref 43–77)
Platelets: 317 10*3/uL (ref 150–400)
RBC: 4.8 MIL/uL (ref 3.87–5.11)
RDW: 13.5 % (ref 11.5–15.5)
WBC: 5.9 10*3/uL (ref 4.0–10.5)

## 2015-12-31 LAB — COMPLETE METABOLIC PANEL WITH GFR
ALBUMIN: 4.4 g/dL (ref 3.6–5.1)
ALK PHOS: 66 U/L (ref 33–130)
ALT: 22 U/L (ref 6–29)
AST: 22 U/L (ref 10–35)
BILIRUBIN TOTAL: 0.5 mg/dL (ref 0.2–1.2)
BUN: 16 mg/dL (ref 7–25)
CALCIUM: 9.2 mg/dL (ref 8.6–10.4)
CO2: 27 mmol/L (ref 20–31)
CREATININE: 0.76 mg/dL (ref 0.50–1.05)
Chloride: 106 mmol/L (ref 98–110)
GFR, Est African American: >89 mL/min (ref >=60)
GFR, Est Non African American: >89 mL/min (ref >=60)
Glucose, Bld: 105 mg/dL — ABNORMAL HIGH (ref 70–99)
POTASSIUM: 4 mmol/L (ref 3.5–5.3)
Sodium: 143 mmol/L (ref 135–146)
Total Protein: 7 g/dL (ref 6.1–8.1)

## 2015-12-31 MED ORDER — LEVOFLOXACIN 500 MG PO TABS: 500.0000 mg | ORAL_TABLET | Freq: Every day | ORAL | Status: DC

## 2015-12-31 MED ORDER — PREDNISONE 20 MG PO TABS: ORAL_TABLET | ORAL | Status: DC

## 2015-12-31 MED ORDER — TRAMADOL HCL 50 MG PO TABS: 100.0000 mg | ORAL_TABLET | Freq: Two times a day (BID) | ORAL | Status: DC

## 2015-12-31 MED ORDER — CYCLOBENZAPRINE HCL 10 MG PO TABS: ORAL_TABLET | ORAL | Status: DC

## 2015-12-31 NOTE — Progress Notes (Signed)
Subjective:    Patient ID: Felicia [name], female    DOB: 03-15-1966, 50 y.o.   MRN: [id number]  HPI Patient is here today for complete physical exam. Past medical history is significant for ASCVD with severe peripheral vascular disease. She also has chronic asthma which I believe also has a component of COPD. She reports a chronic cough that his last analysis 2 months. She reports wheezing. The cough is productive of green sputum. She reports increasing shortness of breath. On examination today she does have diffuse expiratory wheezing with rhonchorous breath sounds. She has never been evaluated for COPD but she does have a significant past medical history of smoking. Her mammogram was performed last summer along with her Pap smear. Both of these are up-to-date. She is however due for a colonoscopy. She has had Pneumovax 23 along with her flu shot. She is due for Prevnar 13 however I'm going to give her steroids today for her COPD exacerbation and therefore I have recommended this at another time. She also complains of severe pain in her shoulder and her back in both knees. She is having to use tramadol 50 mg, 2 tablets in the morning and 2 tablets in the evening. At this dose she is able to complete all of her housework and other home activities. She is also using Flexeril as needed for muscle spasms. Past Medical History  Diagnosis Date  . Asthma   . PUD (peptic ulcer disease)   . Anxiety   . Bipolar 1 disorder (HCC)   . Insomnia   . Hyperlipidemia   . Anemia   . Atrial fibrillation (HCC)   . Peripheral arterial disease (HCC)   . Heart murmur   . Shortness of breath dyspnea   . Pneumonia   . Depression   . Headache   . Arthritis   . Ectopic pregnancy   . Right knee pain 12-05-2015    per pt.  she fell out of her husband's truck and landed on her right knee   Past Surgical History  Procedure Laterality Date  . Shoulder surgery    . Nose surgery    . Cesarean section    . Nose  surgery    . Back surgery    . Tonsillectomy    . Aorta - bilateral femoral artery bypass graft N/A 10/23/2014    Procedure: AORTOBIFEMORAL BYPASS GRAFT;  Surgeon: Chuck Hint, MD;  [facility];  Service: Vascular;  Laterality: N/A;  . Abdominal aortagram N/A 09/12/2012    Procedure: ABDOMINAL Ronny Flurry;  Surgeon: Fransisco Hertz, MD;  Location[facility] CATH LAB;  Service: Cardiovascular;  Laterality: N/A;  . Tubal ligation    . Dilatation & curettage/hysteroscopy with myosure N/A 10/15/2015    Procedure: DILATATION & CURETTAGE/HYSTEROSCOPY WITH MYOSURE;  Surgeon: Ok Edwards, MD;  Location: [facility] ORS;  Service: Gynecology;  Laterality: N/A;   Current Outpatient Prescriptions on File Prior to Visit  Medication Sig Dispense Refill  . albuterol (PROVENTIL HFA;VENTOLIN HFA) 108 (90 BASE) MCG/ACT inhaler Inhale 2 puffs into the lungs every 6 (six) hours as needed. For shortness of breath 18 g 3  . aspirin 81 MG tablet Take 81 mg by mouth daily.    Marland Kitchen atorvastatin (LIPITOR) 80 MG tablet Take 1 tablet (80 mg total) by mouth daily. 30 tablet 6  . beclomethasone (QVAR) 80 MCG/ACT inhaler Inhale 2 puffs into the lungs 2 (two) times daily.    . clonazePAM (KLONOPIN) 1 MG tablet Take  1 tablet (1 mg total) by mouth 3 (three) times daily. 90 tablet 2  . diphenhydrAMINE (BENADRYL) 50 MG capsule Take 50 mg by mouth every 6 (six) hours as needed for itching.    Marland Kitchen EPINEPHrine (EPI-PEN) 0.3 mg/0.3 mL SOAJ injection Inject 0.3 mLs (0.3 mg total) into the muscle once. 1 Device 2  . ibuprofen (ADVIL,MOTRIN) 200 MG tablet Take 400-600 mg by mouth every 6 (six) hours as needed for headache or moderate pain (for pain).     . Multiple Vitamin (MULTIVITAMIN) tablet Take 1 tablet by mouth daily.    . mupirocin cream (BACTROBAN) 2 % Apply 1 application topically 2 (two) times daily. 15 g 0  . divalproex (DEPAKOTE ER) 500 MG 24 hr tablet TAKE 2 TABLETS BY MOUTH EVERY DAY AT BEDTIME (Patient not taking: Reported on  12/25/2015) 180 tablet 0   No current facility-administered medications on file prior to visit.   Allergies  Allergen Reactions  . Augmentin [Amoxicillin-Pot Clavulanate] Diarrhea  . Bactrim [Sulfamethoxazole-Trimethoprim] Hives  . Bee Pollen Swelling  . Cephalosporins Hives  . Codeine Nausea Only  . Doxycycline Hives  . Tetracyclines & Related Hives  . Reflex Pain Relief [Menthol] Rash    Muscle Rub   Social History   Social History  . Marital Status: Married    Spouse Name: N/A  . Number of Children: N/A  . Years of Education: N/A   Occupational History  . Not on file.   Social History Main Topics  . Smoking status: Former Smoker -- 0.25 packs/day for 30 years    Types: Cigarettes    Quit date: 10/19/2014  . Smokeless tobacco: Never Used     Comment: pt states that she is dealing with some stressful events in her life and as a result has began smoking about 3-4 cigs per day  . Alcohol Use: 0.6 oz/week    1 Shots of liquor, 0 Standard drinks or equivalent per week     Comment: very occassionally on social occassions  . Drug Use: No  . Sexual Activity: Yes   Other Topics Concern  . Not on file   Social History Narrative   Family History  Problem Relation Age of Onset  . Heart disease Mother   . Diabetes Mother   . Hyperlipidemia Mother   . Hypertension Mother   . Heart attack Mother   . Heart disease Father   . Diabetes Father   . Heart attack Father   . Hypertension Father   . Diabetes Brother       Review of Systems  All other systems reviewed and are negative.      Objective:   Physical Exam  Constitutional: She is oriented to person, place, and time. She appears well-developed and well-nourished. No distress.  HENT:  Head: Normocephalic and atraumatic.  Right Ear: External ear normal.  Left Ear: External ear normal.  Nose: Nose normal.  Mouth/Throat: Oropharynx is clear and moist. No oropharyngeal exudate.  Eyes: Conjunctivae and EOM are  normal. Pupils are equal, round, and reactive to light. Right eye exhibits no discharge. Left eye exhibits no discharge. No scleral icterus.  Neck: Normal range of motion. Neck supple. No JVD present. No tracheal deviation present. No thyromegaly present.  Cardiovascular: Normal rate, regular rhythm, normal heart sounds and intact distal pulses.  Exam reveals no gallop and no friction rub.   No murmur heard. Pulmonary/Chest: Effort normal. No stridor. No respiratory distress. She has wheezes. She has rales. She  exhibits no tenderness.  Abdominal: Soft. Bowel sounds are normal. She exhibits no distension and no mass. There is no tenderness. There is no rebound and no guarding.  Musculoskeletal: Normal range of motion. She exhibits no edema or tenderness.  Lymphadenopathy:    She has no cervical adenopathy.  Neurological: She is alert and oriented to person, place, and time. She has normal reflexes. No cranial nerve deficit. She exhibits normal muscle tone. Coordination normal.  Skin: Skin is warm. No rash noted. She is not diaphoretic. No erythema. No pallor.  Psychiatric: She has a normal mood and affect. Her behavior is normal. Judgment and thought content normal.  Vitals reviewed.         Assessment & Plan:  ASCVD (arteriosclerotic cardiovascular disease) - Plan: CBC with Differential/Platelet, COMPLETE METABOLIC PANEL WITH GFR, Lipid panel  COPD exacerbation (HCC) - Plan: predniSONE (DELTASONE) 20 MG tablet, levofloxacin (LEVAQUIN) 500 MG tablet  Colon cancer screening - Plan: Ambulatory referral to Gastroenterology  Routine general medical examination at a health care facility  Mammogram and Pap smear up-to-date. I will schedule the patient for colonoscopy as she is overdue for colon cancer screening. Immunizations are up-to-date. I did recommend the patient received Prevnar 13 but I recommended we wait until after she completes steroids. I will treat her as a COPD exacerbation using  Levaquin along with a prednisone taper pack. She may also have an underlying element of sinusitis. She can use albuterol 2 puffs inhaled every 6 hours as needed for wheezing. I will also check a CBC, CMP, and fasting lipid panel. Her goal LDL cholesterol is less than 70 given her history of ASCVD. The remainder of her preventative care is up-to-date. If breathing does not improve, I recommend pulmonary function test to evaluate for underlying COPD and consider discontinuing Qvar and replacing with a long-acting bronchodilator such as Advair, Symbicort, or breo.

## 2016-01-02 ENCOUNTER — Encounter: Payer: Self-pay | Admitting: Family Medicine

## 2016-01-07 ENCOUNTER — Encounter: Payer: Self-pay | Admitting: Gastroenterology

## 2016-01-07 ENCOUNTER — Other Ambulatory Visit: Payer: Self-pay | Admitting: Family Medicine

## 2016-01-07 NOTE — Telephone Encounter (Signed)
Refill appropriate and filled per protocol. 

## 2016-01-10 ENCOUNTER — Other Ambulatory Visit: Payer: Self-pay | Admitting: Family Medicine

## 2016-01-10 NOTE — Telephone Encounter (Signed)
ok 

## 2016-01-10 NOTE — Telephone Encounter (Signed)
LRF 10/03/15 #90 + 2  LOV 12/31/15  OK refill?

## 2016-01-10 NOTE — Telephone Encounter (Signed)
Refill to pharmacy and pt aware.

## 2016-02-26 ENCOUNTER — Encounter: Payer: Self-pay | Admitting: Gastroenterology

## 2016-02-26 ENCOUNTER — Other Ambulatory Visit: Payer: Self-pay | Admitting: Family Medicine

## 2016-02-27 ENCOUNTER — Telehealth: Payer: Self-pay | Admitting: Family Medicine

## 2016-02-27 NOTE — Telephone Encounter (Signed)
Called and spoke to pt and she states that she is still coughing up some mucous but did use her neb and feels much better. Informed pt that if she gets worse or develops a fever she NTBS.   Pt would also like a refill of her Tramadol and she is due for it - ? OK to Refill

## 2016-02-27 NOTE — Telephone Encounter (Signed)
ok 

## 2016-02-27 NOTE — Telephone Encounter (Signed)
Ok to refill??  Last office visit/  refill 12/31/2015, #1 refill.

## 2016-02-27 NOTE — Telephone Encounter (Signed)
Patient called in states she has a really bad cough has vomited due to the cough being so bad. She states that she has jerked something in her back and now has back and shoulder pain. She would like you to call her at [phone]

## 2016-02-27 NOTE — Telephone Encounter (Signed)
Medication called to pharmacy. 

## 2016-02-28 NOTE — Telephone Encounter (Signed)
Medication was filled through rx requests - pt aware

## 2016-03-04 ENCOUNTER — Encounter: Payer: Medicare Other | Admitting: Gastroenterology

## 2016-03-24 IMAGING — CT CT ANGIO AOBIFEM WO/W CM
1 of 8 series · 11 of 33 positions shown · IV contrast ([ID] OMNI 350)
Comparison: 07/27/2012

CLINICAL DATA: Bilateral lower extremity claudication. Significant
aortoiliac occlusive disease affecting the distal aorta and common
iliac arteries by prior CTA.

EXAM:
CT ANGIOGRAPHY OF ABDOMINAL AORTA WITH ILIOFEMORAL RUNOFF
TECHNIQUE: Multidetector CT imaging of the abdomen, pelvis and lower
extremities was performed using the standard protocol during bolus
administration of intravenous contrast. Multiplanar CT image
reconstructions and MIPs were obtained to evaluate the vascular
anatomy.
CONTRAST:  150mL OMNIPAQUE IOHEXOL 350 MG/ML SOLN

[Series 5: runoff · axial · 0.74mm/px · z∈[-1045,-58]mm · 11 of 466 slices shown]
[im 39/466  soft-tissue]
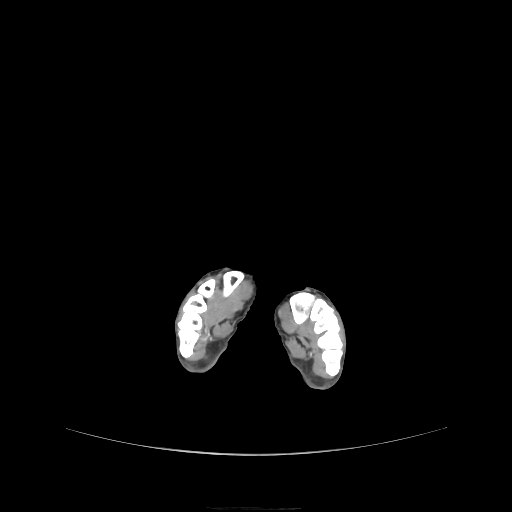
[im 78/466  bone]
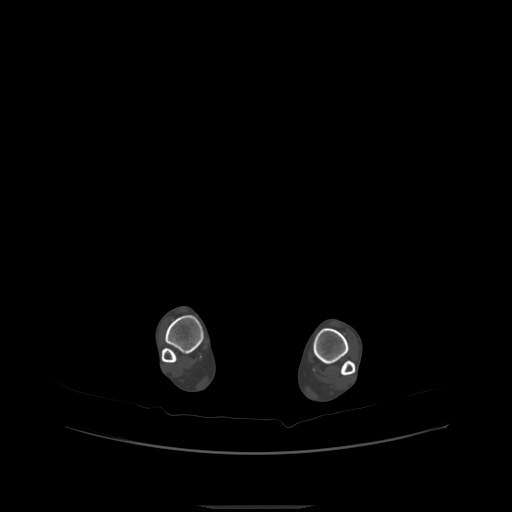
[im 117/466  soft-tissue]
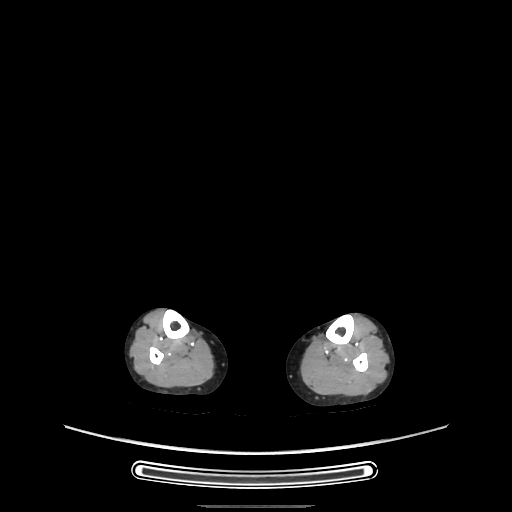
[im 156/466  bone]
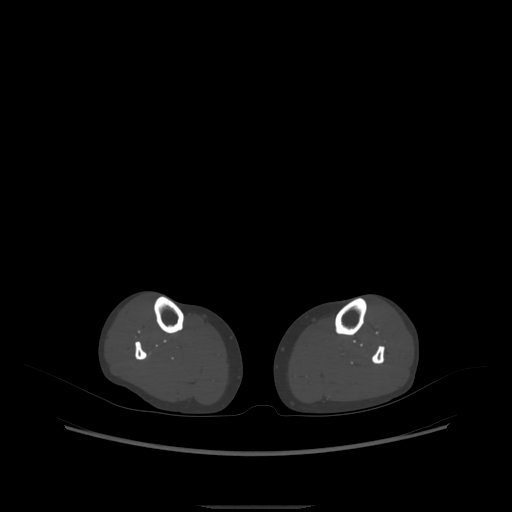
[im 194/466  soft-tissue]
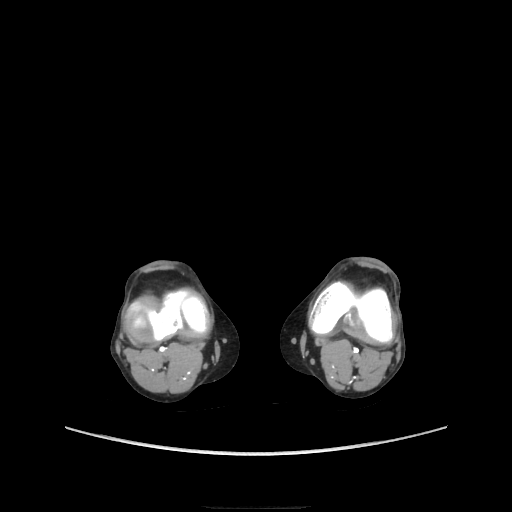
[im 233/466  bone]
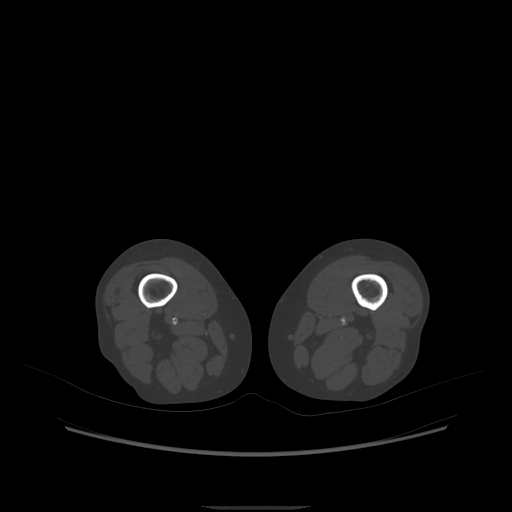
[im 272/466  soft-tissue]
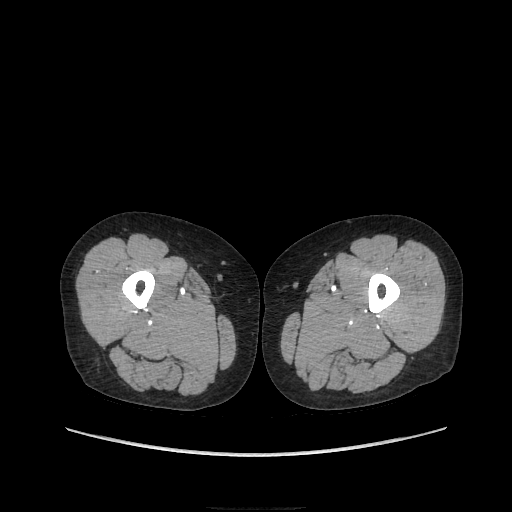
[im 311/466  bone]
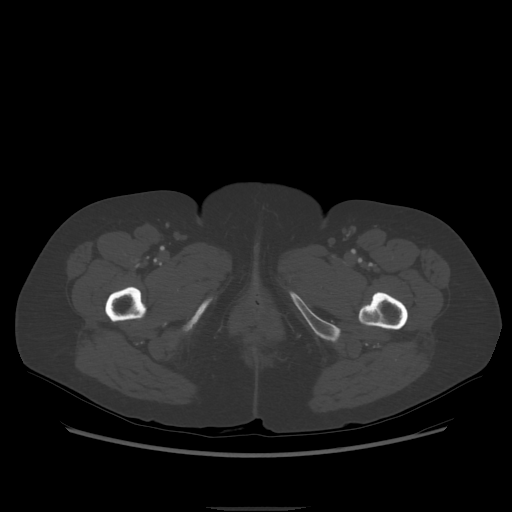
[im 349/466  soft-tissue]
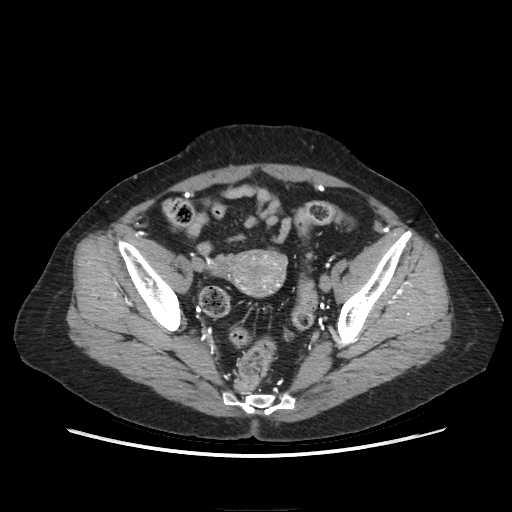
[im 388/466  bone]
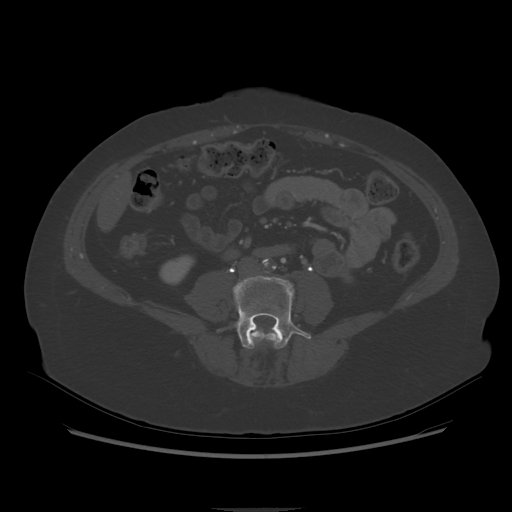
[im 427/466  soft-tissue]
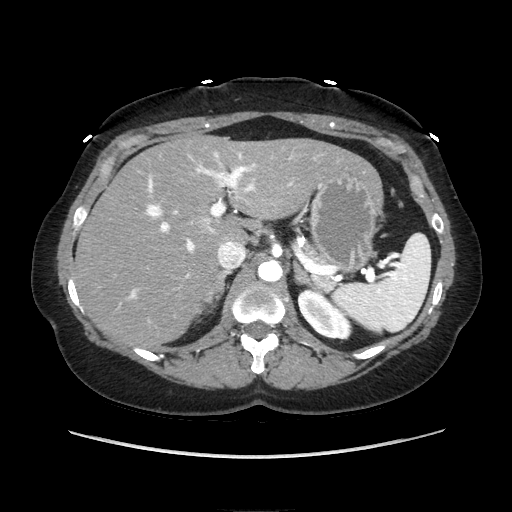

[11 of 33 positions shown; findings below may reference images not displayed]

FINDINGS: Aorta: The suprarenal abdominal aorta shows normal and stable
patency without evidence of aneurysmal disease. There is progressive
stenosis of the infrarenal aorta beginning immediately below the
origins of the renal arteries with eccentric predominately
noncalcified plaque causing approximately 70% narrowing of the
infrarenal aortic lumen. Extensive plaque continues to the level of
the distal aorta where there is now complete occlusion of the distal
aorta just be on the inferior mesenteric artery origin which remains
patent. Both common iliac arteries are now chronically occluded.

Right Lower Extremity: Occluded common iliac artery. There is
reconstitution of a tiny caliber external iliac artery.
Reconstituted flow was also noted in internal iliac branches. The
common femoral artery shows mild disease just below the inguinal
ligament without significant stenosis. The femoral bifurcation is
patent.

Left Lower Extremity: Occluded left common iliac artery. There is
reconstitution of a tiny external iliac artery and reconstitution of
internal iliac branches. The common femoral artery shows normal
patency. The femoral bifurcation is patent.

Review of the MIP images confirms the above findings.

Nonvascular stable steatosis of the liver without focal lesion or
overt cirrhotic changes. The gallbladder, pancreas, spleen, adrenal
glands and kidneys are unremarkable. Bowel shows normal caliber. No
incidental masses or enlarged lymph nodes identified. No hernias are
seen. The bladder is unremarkable. Uterus and adnexal regions are
unremarkable by CT. Bony structures show no abnormalities.
IMPRESSION: Progressive aortoiliac occlusive disease with progressive stenosis
of the infrarenal aortic lumen and complete occlusion of the distal
aorta just below the IMA origin. Both common iliac arteries are now
also chronically occluded with reconstitution of tiny caliber
external iliac arteries bilaterally.

## 2016-03-25 ENCOUNTER — Other Ambulatory Visit: Payer: Self-pay | Admitting: Family Medicine

## 2016-03-26 NOTE — Telephone Encounter (Signed)
ok 

## 2016-03-26 NOTE — Telephone Encounter (Signed)
Ok to refill 

## 2016-03-26 NOTE — Telephone Encounter (Signed)
Prescription sent to pharmacy.

## 2016-04-07 ENCOUNTER — Other Ambulatory Visit: Payer: Self-pay | Admitting: Family Medicine

## 2016-04-07 NOTE — Telephone Encounter (Signed)
Ok to refill??  Last office visit 12/31/2015.  Last refill 01/10/2016, #2 refills.

## 2016-04-07 NOTE — Telephone Encounter (Signed)
ok 

## 2016-04-07 NOTE — Telephone Encounter (Signed)
Medication called to pharmacy. 

## 2016-04-21 ENCOUNTER — Encounter: Payer: Self-pay | Admitting: Family Medicine

## 2016-04-21 ENCOUNTER — Telehealth: Payer: Self-pay | Admitting: *Deleted

## 2016-04-21 NOTE — Telephone Encounter (Signed)
Baxter HireKristen,  This pt is cleared for anesthetic care at [facility].  Warm regards,  Cathlyn ParsonsJohn Angeline Trick

## 2016-04-21 NOTE — Telephone Encounter (Signed)
John,  Would you please review this pt's chart?  I just wanted to make sure she is ok for [facility].  Thanks, WPS ResourcesKristen

## 2016-04-22 NOTE — Telephone Encounter (Signed)
Will proceed as  Scheduled  Felicia [name]  PV

## 2016-04-23 ENCOUNTER — Other Ambulatory Visit: Payer: Self-pay | Admitting: Family Medicine

## 2016-04-23 NOTE — Telephone Encounter (Signed)
Ok to refill 

## 2016-04-23 NOTE — Telephone Encounter (Signed)
Ok to refill??  Last office visit 12/31/2015.  Last refill 02/27/2016, #1 refill.

## 2016-04-23 NOTE — Telephone Encounter (Signed)
ok 

## 2016-04-23 NOTE — Telephone Encounter (Signed)
Prescription sent to pharmacy.

## 2016-04-24 NOTE — Telephone Encounter (Signed)
Medication called to pharmacy. 

## 2016-04-29 ENCOUNTER — Ambulatory Visit (AMBULATORY_SURGERY_CENTER): Payer: Self-pay | Admitting: *Deleted

## 2016-04-29 ENCOUNTER — Other Ambulatory Visit: Payer: Self-pay | Admitting: Family Medicine

## 2016-04-29 VITALS — Ht <= 58 in | Wt 138.0 lb

## 2016-04-29 DIAGNOSIS — Z1211 Encounter for screening for malignant neoplasm of colon: Secondary | ICD-10-CM

## 2016-04-29 NOTE — Telephone Encounter (Signed)
Refill appropriate and filled per protocol. 

## 2016-04-29 NOTE — Progress Notes (Signed)
No egg or soy allergy known to patient  No issues with past sedation with any surgeries  or procedures, no intubation problems - with rotator cuff surgery had an issue with the mask over her face where she felt like she couldn't breathe- MD told her post op she was fine but scared pt. No diet pills per patient No home 02 use per patient  No blood thinners per patient  Pt denies issues with constipation chronically- states occ issue with constipation  Old colon and egd report from 01-14-2004 with DMann already scanned in Epic- colon normal with int.hems, egd showed gastritis

## 2016-04-30 ENCOUNTER — Encounter: Payer: Self-pay | Admitting: Gastroenterology

## 2016-05-13 ENCOUNTER — Ambulatory Visit (AMBULATORY_SURGERY_CENTER): Payer: Medicare Other | Admitting: Gastroenterology

## 2016-05-13 ENCOUNTER — Encounter: Payer: Self-pay | Admitting: Gastroenterology

## 2016-05-13 VITALS — BP 99/45 | HR 90 | Temp 96.8°F | Resp 13 | Ht <= 58 in | Wt 138.0 lb

## 2016-05-13 DIAGNOSIS — Z1211 Encounter for screening for malignant neoplasm of colon: Secondary | ICD-10-CM | POA: Diagnosis present

## 2016-05-13 MED ORDER — SODIUM CHLORIDE 0.9 % IV SOLN: 500.0000 mL | INTRAVENOUS | Status: DC

## 2016-05-13 NOTE — Op Note (Addendum)
[facility] Patient Name: Felicia [name] Procedure Date: 05/13/2016 8:45 AM MRN: [id number] Endoscopist: Sherilyn Cooter L. Myrtie Neither , MD Age: 50 Referring MD:  Date of Birth: [birth year] Gender: Female Account #: [id number] Procedure:                Colonoscopy Indications:              Screening for colorectal malignant neoplasm Medicines:                Monitored Anesthesia Care Procedure:                Pre-Anesthesia Assessment:                           - Prior to the procedure, a History and Physical                            was performed, and patient medications and                            allergies were reviewed. The patient's tolerance of                            previous anesthesia was also reviewed. The risks                            and benefits of the procedure and the sedation                            options and risks were discussed with the patient.                            All questions were answered, and informed consent                            was obtained. Prior Anticoagulants: The patient has                            taken no previous anticoagulant or antiplatelet                            agents. ASA Grade Assessment: II - A patient with                            mild systemic disease. After reviewing the risks                            and benefits, the patient was deemed in                            satisfactory condition to undergo the procedure.                           After obtaining informed consent, the colonoscope  was passed under direct vision. Throughout the                            procedure, the patient's blood pressure, pulse, and                            oxygen saturations were monitored continuously. The                            Model CF-HQ190L [id number]) scope was introduced                            through the anus and advanced to the the cecum,                            identified by  appendiceal orifice and ileocecal                            valve. The ileocecal valve, appendiceal orifice,                            and rectum were photographed. The quality of the                            bowel preparation was good. The colonoscopy was                            performed without difficulty. The patient tolerated                            the procedure well. The bowel preparation used was                            Miralax. Scope In: 8:56:44 AM Scope Out: 9:09:29 AM Scope Withdrawal Time: 0 hours 8 minutes 29 seconds  Total Procedure Duration: 0 hours 12 minutes 45 seconds  Findings:                 The exam was otherwise without abnormality on                            direct and retroflexion views.                           Multiple small-mouthed diverticula were found in                            the left colon. Complications:            No immediate complications. Estimated blood loss:                            None. Estimated Blood Loss:     Estimated blood loss: none. Recommendation:           - Repeat colonoscopy in 10 years for screening  purposes.                           - Patient has a contact number available for                            emergencies. The signs and symptoms of potential                            delayed complications were discussed with the                            patient. Return to normal activities tomorrow.                            Written discharge instructions were provided to the                            patient.                           - Resume previous diet.                           - Continue present medications. Gerold Sar L. Myrtie Neither, MD 05/13/2016 9:14:04 AM This report has been signed electronically.

## 2016-05-13 NOTE — Patient Instructions (Signed)
Impression/recommendations:  Diverticulosis (handout given) High Fiber diet (handout given)  YOU HAD AN ENDOSCOPIC PROCEDURE TODAY AT THE [facility]:   Refer to the procedure report that was given to you for any specific questions about what was found during the examination.  If the procedure report does not answer your questions, please call your gastroenterologist to clarify.  If you requested that your care partner not be given the details of your procedure findings, then the procedure report has been included in a sealed envelope for you to review at your convenience later.  YOU SHOULD EXPECT: Some feelings of bloating in the abdomen. Passage of more gas than usual.  Walking can help get rid of the air that was put into your GI tract during the procedure and reduce the bloating. If you had a lower endoscopy (such as a colonoscopy or flexible sigmoidoscopy) you may notice spotting of blood in your stool or on the toilet paper. If you underwent a bowel prep for your procedure, you may not have a normal bowel movement for a few days.  Please Note:  You might notice some irritation and congestion in your nose or some drainage.  This is from the oxygen used during your procedure.  There is no need for concern and it should clear up in a day or so.  SYMPTOMS TO REPORT IMMEDIATELY:   Following lower endoscopy (colonoscopy or flexible sigmoidoscopy):  Excessive amounts of blood in the stool  Significant tenderness or worsening of abdominal pains  Swelling of the abdomen that is new, acute  Fever of 100F or higher   For urgent or emergent issues, a gastroenterologist can be reached at any hour by calling [phone].   DIET: Your first meal following the procedure should be a small meal and then it is ok to progress to your normal diet. Heavy or fried foods are harder to digest and may make you feel nauseous or bloated.  Likewise, meals heavy in dairy and vegetables can increase  bloating.  Drink plenty of fluids but you should avoid alcoholic beverages for 24 hours.  ACTIVITY:  You should plan to take it easy for the rest of today and you should NOT DRIVE or use heavy machinery until tomorrow (because of the sedation medicines used during the test).    FOLLOW UP: Our staff will call the number listed on your records the next business day following your procedure to check on you and address any questions or concerns that you may have regarding the information given to you following your procedure. If we do not reach you, we will leave a message.  However, if you are feeling well and you are not experiencing any problems, there is no need to return our call.  We will assume that you have returned to your regular daily activities without incident.  If any biopsies were taken you will be contacted by phone or by letter within the next 1-3 weeks.  Please call us at [phone] if you have not heard about the biopsies in 3 weeks.    SIGNATURES/CONFIDENTIALITY: You and/or your care partner have signed paperwork which will be entered into your electronic medical record.  These signatures attest to the fact that that the information above on your After Visit Summary has been reviewed and is understood.  Full responsibility of the confidentiality of this discharge information lies with you and/or your care-partner.

## 2016-05-13 NOTE — Progress Notes (Signed)
To recovery, report to Ennis, RN, VSS 

## 2016-05-14 ENCOUNTER — Telehealth: Payer: Self-pay | Admitting: *Deleted

## 2016-05-14 ENCOUNTER — Telehealth: Payer: Self-pay | Admitting: Family Medicine

## 2016-05-14 NOTE — Telephone Encounter (Signed)
  Follow up Call-  Call back number 05/13/2016  Post procedure Call Back phone  # [phone]  Permission to leave phone message Yes     Patient questions:  Message left to call us if necessary.

## 2016-05-14 NOTE — Telephone Encounter (Signed)
Patient has questions about if she should make an appt now, or wait until her upcoming appt for the issues she is having  [phone]

## 2016-05-14 NOTE — Telephone Encounter (Signed)
LMTRC

## 2016-05-19 NOTE — Telephone Encounter (Signed)
LMTRC

## 2016-05-22 ENCOUNTER — Encounter: Payer: Self-pay | Admitting: Cardiology

## 2016-05-22 ENCOUNTER — Other Ambulatory Visit: Payer: Self-pay | Admitting: Family Medicine

## 2016-05-22 NOTE — Telephone Encounter (Signed)
Ok to refill 

## 2016-05-25 NOTE — Telephone Encounter (Signed)
ok 

## 2016-05-28 ENCOUNTER — Telehealth: Payer: Self-pay | Admitting: Family Medicine

## 2016-05-28 ENCOUNTER — Encounter: Payer: Self-pay | Admitting: Family Medicine

## 2016-05-28 ENCOUNTER — Ambulatory Visit (INDEPENDENT_AMBULATORY_CARE_PROVIDER_SITE_OTHER): Payer: Medicare Other | Admitting: Family Medicine

## 2016-05-28 VITALS — BP 170/70 | HR 68 | Temp 98.1°F | Resp 16 | Ht <= 58 in | Wt 138.0 lb

## 2016-05-28 DIAGNOSIS — G5601 Carpal tunnel syndrome, right upper limb: Secondary | ICD-10-CM | POA: Diagnosis not present

## 2016-05-28 DIAGNOSIS — M25511 Pain in right shoulder: Secondary | ICD-10-CM

## 2016-05-28 DIAGNOSIS — M7071 Other bursitis of hip, right hip: Secondary | ICD-10-CM | POA: Diagnosis not present

## 2016-05-28 MED ORDER — PREDNISONE 20 MG PO TABS: ORAL_TABLET | ORAL | Status: DC

## 2016-05-28 MED ORDER — LOSARTAN POTASSIUM-HCTZ 50-12.5 MG PO TABS: 1.0000 | ORAL_TABLET | Freq: Every day | ORAL | Status: DC

## 2016-05-28 NOTE — Telephone Encounter (Signed)
Medication called/sent to requested pharmacy  

## 2016-05-28 NOTE — Progress Notes (Signed)
Subjective:    Patient ID: Felicia [name], female    DOB: 02/18/1966, 50 y.o.   MRN: [id number]  HPI  Patient has a history of chronic pain in her right shoulder. MRI in 2015 confirmed complete full-thickness tear of the suprasternal not as, emphasis cannot is, and subscapularis. There is also retraction of the muscle tendons. Orthopedics recommended against any surgery as this was a chronic tear and likely not amenable to surgical correction. She also has a history of a partial tear in the biceps tendon. Because of this she has near daily chronic pain in her right shoulder. She also has limited abduction to only 70. She also reports muscle spasms and around the deltoid. She is also complaining of numbness and pain distal to the wrist on her right hand. She'll report a burning sensation on the palm of her hand that radiates into her fingertips. She denies any weakness in grip strength. It does seem to be worse after using her hand to type on her keyboard. She also recently fell walking up a flight of steps and developed pain in her right lateral hip near the greater trochanter. It hurts to sleep on the hip at night. There is no pain with walking but the discomfort over the greater trochanter keeps her awake. Past Medical History  Diagnosis Date  . Asthma   . PUD (peptic ulcer disease)   . Anxiety   . Bipolar 1 disorder (HCC)   . Insomnia   . Hyperlipidemia   . Anemia   . Atrial fibrillation (HCC)   . Peripheral arterial disease (HCC)   . Heart murmur   . Shortness of breath dyspnea   . Pneumonia   . Depression   . Headache   . Arthritis   . Ectopic pregnancy   . Right knee pain 12-05-2015    per pt.  she fell out of her husband's truck and landed on her right knee  . Allergy     year around   Past Surgical History  Procedure Laterality Date  . Shoulder surgery      rotator cuff repair   . Nose surgery    . Cesarean section    . Nose surgery    . Back surgery    .  Tonsillectomy    . Aorta - bilateral femoral artery bypass graft N/A 10/23/2014    Procedure: AORTOBIFEMORAL BYPASS GRAFT;  Surgeon: Chuck Hinthristopher S Dickson, MD;  [facility];  Service: Vascular;  Laterality: N/A;  . Abdominal aortagram N/A 09/12/2012    Procedure: ABDOMINAL Ronny FlurryAORTAGRAM;  Surgeon: Fransisco HertzBrian L Chen, MD;  [facility];  Service: Cardiovascular;  Laterality: N/A;  . Tubal ligation    . Dilatation & curettage/hysteroscopy with myosure N/A 10/15/2015    Procedure: DILATATION & CURETTAGE/HYSTEROSCOPY WITH MYOSURE;  Surgeon: Ok EdwardsJuan H Fernandez, MD;  Location: [facility] ORS;  Service: Gynecology;  Laterality: N/A;  . Colonoscopy  01-14-2004    > 11 yr ago Dr Loreta AveMann - normal - report in epic   . Esophagogastroduodenoscopy  01-14-2004    gastritis- dr Loreta Avemann- in epic already   . Upper gastrointestinal endoscopy  01-14-2004    in epic- dr Loreta Avemann- gastritis    Current Outpatient Prescriptions on File Prior to Visit  Medication Sig Dispense Refill  . albuterol (PROVENTIL HFA;VENTOLIN HFA) 108 (90 BASE) MCG/ACT inhaler Inhale 2 puffs into the lungs every 6 (six) hours as needed. For shortness of breath 18 g 3  . aspirin 325 MG  tablet Take 325 mg by mouth daily.    Marland Kitchen. atorvastatin (LIPITOR) 80 MG tablet TAKE 1 TABLET (80 MG TOTAL) BY MOUTH DAILY. 30 tablet 6  . beclomethasone (QVAR) 80 MCG/ACT inhaler Inhale 2 puffs into the lungs 2 (two) times daily.    . Biotin 1000 MCG tablet Take 5,000 mcg by mouth daily.    . clonazePAM (KLONOPIN) 1 MG tablet TAKE 1 TABLET BY MOUTH 3 TIMES A DAY 90 tablet 2  . cyclobenzaprine (FLEXERIL) 10 MG tablet TAKE ONE TABLET BY MOUTH THREE TIMES DAILY AS NEEDED FOR  MUSCLE  SPASMS 90 tablet 1  . diphenhydrAMINE (BENADRYL) 50 MG capsule Take 50 mg by mouth every 6 (six) hours as needed for itching.    Marland Kitchen. EPIPEN 2-PAK 0.3 MG/0.3ML SOAJ injection USE AS DIRECTED 2 Device 0  . ibuprofen (ADVIL,MOTRIN) 200 MG tablet Take 400-600 mg by mouth every 6 (six) hours as needed for  headache or moderate pain (for pain).     . Multiple Vitamin (MULTIVITAMIN) tablet Take 1 tablet by mouth daily.    . mupirocin cream (BACTROBAN) 2 % Apply 1 application topically 2 (two) times daily. (Patient not taking: Reported on 05/13/2016) 15 g 0  . traMADol (ULTRAM) 50 MG tablet TAKE 2 TABLETS BY MOUTH TWICE A DAY 120 tablet 1   No current facility-administered medications on file prior to visit.   Allergies  Allergen Reactions  . Augmentin [Amoxicillin-Pot Clavulanate] Diarrhea  . Bactrim [Sulfamethoxazole-Trimethoprim] Hives  . Bee Pollen Swelling    Also ticks   . Cephalosporins Hives  . Codeine Nausea Only  . Doxycycline Hives  . Tetracyclines & Related Hives  . Reflex Pain Relief [Menthol] Rash    Muscle Rub   Social History   Social History  . Marital Status: Married    Spouse Name: N/A  . Number of Children: N/A  . Years of Education: N/A   Occupational History  . Not on file.   Social History Main Topics  . Smoking status: Former Smoker -- 0.25 packs/day for 30 years    Types: Cigarettes    Quit date: 10/19/2014  . Smokeless tobacco: Never Used     Comment: pt states that she is dealing with some stressful events in her life and as a result has began smoking about 3-4 cigs per day  . Alcohol Use: 0.6 oz/week    1 Shots of liquor, 0 Standard drinks or equivalent per week     Comment: very occassionally on social occassions  . Drug Use: No  . Sexual Activity: Yes   Other Topics Concern  . Not on file   Social History Narrative     Review of Systems  All other systems reviewed and are negative.      Objective:   Physical Exam  Cardiovascular: Normal rate, regular rhythm and normal heart sounds.   Pulmonary/Chest: Effort normal and breath sounds normal.  Musculoskeletal:       Right shoulder: She exhibits decreased range of motion, tenderness, pain, spasm and decreased strength. She exhibits no bony tenderness.       Right wrist: She exhibits  normal range of motion, no tenderness, no bony tenderness, no swelling, no effusion, no crepitus and no deformity.       Right hip: She exhibits tenderness. She exhibits normal range of motion, normal strength, no bony tenderness and no crepitus.  Vitals reviewed.         Assessment & Plan:  Hip bursitis, right -  Plan: predniSONE (DELTASONE) 20 MG tablet  Carpal tunnel syndrome, right  Right shoulder pain  Shunt a cortisone injection today in her right shoulder but she declined. I believe she has carpal tunnel syndrome in her right wrist. I recommended a cock up wrist splint as well as a trial of oral prednisone/prednisone taper pack. I believe she has bursitis in her right greater trochanteric bursa. We will try an oral prednisone taper pack. Return for cortisone injection if persistent. Her blood pressure significantly elevated today. I will start the patient on losartan HCTZ 50/12.5 one by mouth daily and recheck blood pressure in one month

## 2016-05-28 NOTE — Telephone Encounter (Signed)
Patient says that she would like her prednisone called [facility] the [facility] on [address] if possible

## 2016-06-05 ENCOUNTER — Ambulatory Visit: Payer: Medicare Other | Admitting: Family Medicine

## 2016-06-05 ENCOUNTER — Telehealth: Payer: Self-pay | Admitting: Family Medicine

## 2016-06-05 VITALS — BP 164/90

## 2016-06-05 DIAGNOSIS — I10 Essential (primary) hypertension: Secondary | ICD-10-CM

## 2016-06-05 MED ORDER — LOSARTAN POTASSIUM-HCTZ 50-12.5 MG PO TABS: 1.0000 | ORAL_TABLET | Freq: Every day | ORAL | Status: DC

## 2016-06-05 NOTE — Telephone Encounter (Signed)
BP was 164/90 in left arm.  Pt said had not started BP med ordered at last office visit.  Told her she needs to do so.  [facility] of [facility].  [facility] to [facility].  Told return next week for another BP check.

## 2016-06-16 ENCOUNTER — Ambulatory Visit: Payer: Medicare Other | Admitting: *Deleted

## 2016-06-16 ENCOUNTER — Encounter: Payer: Self-pay | Admitting: *Deleted

## 2016-06-16 VITALS — BP 136/70

## 2016-06-16 DIAGNOSIS — I10 Essential (primary) hypertension: Secondary | ICD-10-CM

## 2016-06-16 NOTE — Progress Notes (Signed)
Patient seen in office to have BP monitored.   Noted WNL.

## 2016-06-23 ENCOUNTER — Other Ambulatory Visit: Payer: Self-pay | Admitting: Family Medicine

## 2016-06-23 NOTE — Telephone Encounter (Signed)
Ok to refill 

## 2016-06-25 NOTE — Telephone Encounter (Signed)
Medication called to pharmacy. 

## 2016-06-25 NOTE — Telephone Encounter (Signed)
ok 

## 2016-07-06 ENCOUNTER — Other Ambulatory Visit: Payer: Self-pay | Admitting: Family Medicine

## 2016-07-06 NOTE — Telephone Encounter (Signed)
ok 

## 2016-07-06 NOTE — Telephone Encounter (Signed)
Ok to refill??  Last office visit 05/28/2016.  Last refill 04/07/2016, #2 refills.

## 2016-07-07 NOTE — Telephone Encounter (Signed)
Medication called to pharmacy. 

## 2016-07-22 ENCOUNTER — Other Ambulatory Visit: Payer: Self-pay | Admitting: Family Medicine

## 2016-07-22 NOTE — Telephone Encounter (Signed)
Ok to refill 

## 2016-07-23 NOTE — Telephone Encounter (Signed)
ok 

## 2016-07-23 NOTE — Telephone Encounter (Signed)
Prescription sent to pharmacy.

## 2016-07-24 ENCOUNTER — Ambulatory Visit (HOSPITAL_COMMUNITY)
Admission: EM | Admit: 2016-07-24 | Discharge: 2016-07-24 | Disposition: A | Discharge status: Home / Self Care | Payer: Medicare Other | Attending: Family Medicine | Admitting: Family Medicine

## 2016-07-24 ENCOUNTER — Encounter (HOSPITAL_COMMUNITY): Payer: Self-pay | Admitting: *Deleted

## 2016-07-24 ENCOUNTER — Telehealth: Payer: Self-pay | Admitting: Family Medicine

## 2016-07-24 DIAGNOSIS — K047 Periapical abscess without sinus: Secondary | ICD-10-CM | POA: Diagnosis not present

## 2016-07-24 MED ORDER — CLINDAMYCIN HCL 300 MG PO CAPS: 300.0000 mg | ORAL_CAPSULE | Freq: Three times a day (TID) | ORAL | 0 refills | Status: DC

## 2016-07-24 MED ORDER — DICLOFENAC POTASSIUM 50 MG PO TABS: 50.0000 mg | ORAL_TABLET | Freq: Three times a day (TID) | ORAL | 0 refills | Status: DC

## 2016-07-24 NOTE — Discharge Instructions (Signed)
Take medicine as prescribed, see your dentist as soon as possible °

## 2016-07-24 NOTE — Telephone Encounter (Signed)
Pt called could barely talk.  Says has hugh abscess from infected tooth.  Says facial swelling the size of a baseball.  Wanted us to call something in for her.  I told her if her face was that swollen, someone needs to see that.  Instructed her to go to an Urgent care or ED

## 2016-07-24 NOTE — ED Triage Notes (Signed)
[  Pt had  A  toothace  r  Side    Became  A  Small  Knot  r  Lower  Jaw   Then  r  Side  Of face  Swollen today   Pain not  releived  By  otc meds

## 2016-07-24 NOTE — Telephone Encounter (Signed)
Agree 

## 2016-07-24 NOTE — ED Provider Notes (Signed)
[facility]    CSN: [id number] Arrival date & time: 07/24/16  1400  First Provider Contact:  First MD Initiated Contact with Patient 07/24/16 1524        History   Chief Complaint Chief Complaint  Patient presents with  . Facial Swelling    HPI Felicia [name] is a 50 y.o. female.    Dental Pain  Location:  Lower Lower teeth location:  27/RL cuspid and 28/RL 1st bicuspid Quality:  Throbbing, localized and pressure-like Severity:  Moderate Duration:  5 hours Progression:  Worsening Chronicity:  New Context: abscess, dental caries and poor dentition   Relieved by:  None tried Worsened by:  Nothing Ineffective treatments:  None tried Associated symptoms: facial swelling   Risk factors: periodontal disease   Risk factors: sufficient dental care     Past Medical History:  Diagnosis Date  . Allergy    year around  . Anemia   . Anxiety   . Arthritis   . Asthma   . Atrial fibrillation (HCC)   . Bipolar 1 disorder (HCC)   . Depression   . Ectopic pregnancy   . Headache   . Heart murmur   . Hyperlipidemia   . Insomnia   . Peripheral arterial disease (HCC)   . Pneumonia   . PUD (peptic ulcer disease)   . Right knee pain 12-05-2015   per pt.  she fell out of her husband's truck and landed on her right knee  . Shortness of breath dyspnea     Patient Active Problem List   Diagnosis Date Noted  . Low back strain 05/08/2015  . Asthma, chronic 05/08/2015  . Atherosclerosis of native artery of both lower extremities with intermittent claudication (HCC) 10/23/2014  . PVD (peripheral vascular disease) with claudication (HCC) 09/26/2014  . Atherosclerosis of native arteries of extremity with intermittent claudication (HCC) 09/14/2012  . PUD (peptic ulcer disease)   . Hyperlipidemia     Past Surgical History:  Procedure Laterality Date  . ABDOMINAL AORTAGRAM N/A 09/12/2012   Procedure: ABDOMINAL AORTAGRAM;  Surgeon: Fransisco HertzBrian L Chen, MD;  [facility];  Service: Cardiovascular;  Laterality: N/A;  . AORTA - BILATERAL FEMORAL ARTERY BYPASS GRAFT N/A 10/23/2014   Procedure: AORTOBIFEMORAL BYPASS GRAFT;  Surgeon: Chuck Hinthristopher S Dickson, MD;  [facility];  Service: Vascular;  Laterality: N/A;  . BACK SURGERY    . CESAREAN SECTION    . COLONOSCOPY  01-14-2004   > 11 yr ago Dr Loreta AveMann - normal - report in epic   . DILATATION & CURETTAGE/HYSTEROSCOPY WITH MYOSURE N/A 10/15/2015   Procedure: DILATATION & CURETTAGE/HYSTEROSCOPY WITH MYOSURE;  Surgeon: Ok EdwardsJuan H Fernandez, MD;  Location: [facility]S;  Service: Gynecology;  Laterality: N/A;  . ESOPHAGOGASTRODUODENOSCOPY  01-14-2004   gastritis- dr Loreta Avemann- in epic already   . NOSE SURGERY    . NOSE SURGERY    . SHOULDER SURGERY     rotator cuff repair   . TONSILLECTOMY    . TUBAL LIGATION    . UPPER GASTROINTESTINAL ENDOSCOPY  01-14-2004   in epic- dr Loreta Avemann- gastritis     OB History    Gravida Para Term Preterm AB Living   3 2     1 2    SAB TAB Ectopic Multiple Live Births       1           Home Medications    Prior to Admission medications   Medication Sig Start Date End  Date Taking? Authorizing Provider  albuterol (PROVENTIL HFA;VENTOLIN HFA) 108 (90 BASE) MCG/ACT inhaler Inhale 2 puffs into the lungs every 6 (six) hours as needed. For shortness of breath 01/22/15   Donita Brooks, MD  aspirin 325 MG tablet Take 325 mg by mouth daily.    Historical Provider, MD  atorvastatin (LIPITOR) 80 MG tablet TAKE 1 TABLET (80 MG TOTAL) BY MOUTH DAILY. 01/07/16   Donita Brooks, MD  beclomethasone (QVAR) 80 MCG/ACT inhaler Inhale 2 puffs into the lungs 2 (two) times daily.    Historical Provider, MD  Biotin 1000 MCG tablet Take 5,000 mcg by mouth daily.    Historical Provider, MD  clonazePAM (KLONOPIN) 1 MG tablet TAKE 1 TABLET BY MOUTH 3 TIMES A DAY AS NEEDED 07/07/16   Donita Brooks, MD  cyclobenzaprine (FLEXERIL) 10 MG tablet TAKE ONE TABLET BY MOUTH THREE TIMES DAILY AS NEEDED FOR  MUSCLE   SPASMS 07/23/16   Donita Brooks, MD  diphenhydrAMINE (BENADRYL) 50 MG capsule Take 50 mg by mouth every 6 (six) hours as needed for itching.    Historical Provider, MD  EPIPEN 2-PAK 0.3 MG/0.3ML SOAJ injection USE AS DIRECTED 04/29/16   Donita Brooks, MD  ibuprofen (ADVIL,MOTRIN) 200 MG tablet Take 400-600 mg by mouth every 6 (six) hours as needed for headache or moderate pain (for pain).     Historical Provider, MD  losartan-hydrochlorothiazide (HYZAAR) 50-12.5 MG tablet Take 1 tablet by mouth daily. 06/05/16   Donita Brooks, MD  Multiple Vitamin (MULTIVITAMIN) tablet Take 1 tablet by mouth daily.    Historical Provider, MD  mupirocin cream (BACTROBAN) 2 % Apply 1 application topically 2 (two) times daily. Patient not taking: Reported on 05/13/2016 10/03/15   Donita Brooks, MD  predniSONE (DELTASONE) 20 MG tablet 3 tabs poqday 1-2, 2 tabs poqday 3-4, 1 tab poqday 5-6 05/28/16   Donita Brooks, MD  traMADol (ULTRAM) 50 MG tablet TAKE 2 TABLETS BY MOUTH TWICE A DAY AS NEEDED FOR PAIN 06/25/16   Donita Brooks, MD    Family History Family History  Problem Relation Age of Onset  . Adopted: Yes  . Heart disease Mother   . Diabetes Mother   . Hyperlipidemia Mother   . Hypertension Mother   . Heart attack Mother   . Heart disease Father   . Diabetes Father   . Heart attack Father   . Hypertension Father   . Bladder Cancer Father   . Diabetes Brother   . Lung cancer Paternal Grandfather   . Colon cancer Neg Hx   . Colon polyps Neg Hx   . Rectal cancer Neg Hx   . Stomach cancer Neg Hx     Social History Social History  Substance Use Topics  . Smoking status: Former Smoker    Packs/day: 0.25    Years: 30.00    Types: Cigarettes    Quit date: 10/19/2014  . Smokeless tobacco: Never Used     Comment: pt states that she is dealing with some stressful events in her life and as a result has began smoking about 3-4 cigs per day  . Alcohol use 0.6 oz/week    1 Shots of liquor per  week     Comment: very occassionally on social occassions     Allergies   Augmentin [amoxicillin-pot clavulanate]; Bactrim [sulfamethoxazole-trimethoprim]; Bee pollen; Cephalosporins; Codeine; Doxycycline; Tetracyclines & related; and Reflex pain relief [menthol]   Review of Systems Review of Systems  Constitutional: Negative.   HENT: Positive for dental problem and facial swelling.   Hematological: Positive for adenopathy.  All other systems reviewed and are negative.    Physical Exam Triage Vital Signs ED Triage Vitals  Enc Vitals Group     BP 07/24/16 1436 149/76     Pulse Rate 07/24/16 1436 98     Resp 07/24/16 1436 16     Temp 07/24/16 1436 98.9 F (37.2 C)     Temp Source 07/24/16 1436 Oral     SpO2 07/24/16 1436 97 %     Weight --      Height --      Head Circumference --      Peak Flow --      Pain Score 07/24/16 1440 10     Pain Loc --      Pain Edu? --      Excl. in GC? --    No data found.   Updated Vital Signs BP 149/76 (BP Location: Left Arm)   Pulse 98   Temp 98.9 F (37.2 C) (Oral)   Resp 16   LMP 12/26/2012   SpO2 97%   Visual Acuity Right Eye Distance:   Left Eye Distance:   Bilateral Distance:    Right Eye Near:   Left Eye Near:    Bilateral Near:     Physical Exam  Constitutional: She appears well-developed and well-nourished.  HENT:  Mouth/Throat: Oropharynx is clear and moist. Dental abscesses and dental caries present.    Lymphadenopathy:    She has cervical adenopathy.  Nursing note and vitals reviewed.    UC Treatments / Results  Labs (all labs ordered are listed, but only abnormal results are displayed) Labs Reviewed - No data to display  EKG  EKG Interpretation None       Radiology No results found.  Procedures Procedures (including critical care time)  Medications Ordered in UC Medications - No data to display   Initial Impression / Assessment and Plan / UC Course  I have reviewed the triage vital  signs and the nursing notes.  Pertinent labs & imaging results that were available during my care of the patient were reviewed by me and considered in my medical decision making (see chart for details).  Clinical Course      Final Clinical Impressions(s) / UC Diagnoses   Final diagnoses:  None    New Prescriptions New Prescriptions   No medications on file     Linna Hoff, MD 07/24/16 1540

## 2016-08-25 ENCOUNTER — Other Ambulatory Visit: Payer: Self-pay | Admitting: Family Medicine

## 2016-08-25 NOTE — Telephone Encounter (Signed)
Ok to refill 

## 2016-08-25 NOTE — Telephone Encounter (Signed)
ok 

## 2016-09-02 ENCOUNTER — Other Ambulatory Visit: Payer: Self-pay | Admitting: Family Medicine

## 2016-09-22 ENCOUNTER — Other Ambulatory Visit: Payer: Self-pay | Admitting: Family Medicine

## 2016-09-22 ENCOUNTER — Telehealth: Payer: Self-pay | Admitting: Family Medicine

## 2016-09-22 ENCOUNTER — Ambulatory Visit: Payer: Self-pay | Admitting: Family Medicine

## 2016-09-22 NOTE — Telephone Encounter (Signed)
Pt c/o painful blistering rash from navel around to back.  Can barely touch.  Poss shingles.  appt given for today.

## 2016-09-23 ENCOUNTER — Ambulatory Visit (INDEPENDENT_AMBULATORY_CARE_PROVIDER_SITE_OTHER): Payer: Medicare Other | Admitting: Physician Assistant

## 2016-09-23 ENCOUNTER — Encounter: Payer: Self-pay | Admitting: Physician Assistant

## 2016-09-23 VITALS — BP 120/60 | HR 107 | Temp 98.7°F | Resp 18 | Ht <= 58 in | Wt 135.0 lb

## 2016-09-23 DIAGNOSIS — B029 Zoster without complications: Secondary | ICD-10-CM

## 2016-09-23 MED ORDER — GABAPENTIN 300 MG PO CAPS: ORAL_CAPSULE | ORAL | 3 refills | Status: DC

## 2016-09-23 MED ORDER — HYDROCODONE-ACETAMINOPHEN 5-325 MG PO TABS: 1.0000 | ORAL_TABLET | Freq: Four times a day (QID) | ORAL | 0 refills | Status: DC | PRN

## 2016-09-23 MED ORDER — VALACYCLOVIR HCL 1 G PO TABS: 1000.0000 mg | ORAL_TABLET | Freq: Three times a day (TID) | ORAL | 0 refills | Status: DC

## 2016-09-23 NOTE — Progress Notes (Signed)
Patient ID: Felicia [name] MRN: [id number], DOB: 12/27/1965, 50 y.o. Date of Encounter: 09/23/2016, 9:34 AM    Chief Complaint:  Chief Complaint  Patient presents with  . rash on stomach and side    x10 days     HPI: 50 y.o. year old female presents with above.   Says that all of this started Friday before last. Says that she had some dental work done and was given some pain medication to use for that. Says that she started developing some areas of blistering rash on her left lower abdomen and since has also developed some on her left back. Says that when she was on the pain medication (for her dental work) she wasn't feeling much pain but since she finished up the pain med, she has been having severe pain all along that left side between the rash on the left lower abdomen and the rash on her left back. Says that it is severe pain-- anytime her clothes rub that skin or even the sheets from the bed touch that skin. She noticed that it would hurt when she would take a deep breath. She did not know what it was coming from until she recently talked to a family member who had had shingles and told her to come here.     Home Meds:   Outpatient Medications Prior to Visit  Medication Sig Dispense Refill  . albuterol (PROVENTIL HFA;VENTOLIN HFA) 108 (90 BASE) MCG/ACT inhaler Inhale 2 puffs into the lungs every 6 (six) hours as needed. For shortness of breath 18 g 3  . aspirin 325 MG tablet Take 325 mg by mouth daily.    Marland Kitchen. atorvastatin (LIPITOR) 80 MG tablet TAKE 1 TABLET (80 MG TOTAL) BY MOUTH DAILY. 30 tablet 6  . beclomethasone (QVAR) 80 MCG/ACT inhaler Inhale 2 puffs into the lungs 2 (two) times daily.    . clonazePAM (KLONOPIN) 1 MG tablet TAKE 1 TABLET BY MOUTH 3 TIMES A DAY AS NEEDED 90 tablet 2  . cyclobenzaprine (FLEXERIL) 10 MG tablet TAKE ONE TABLET BY MOUTH THREE TIMES DAILY AS NEEDED FOR  MUSCLE  SPASMS 90 tablet 1  . diphenhydrAMINE (BENADRYL) 50 MG capsule Take 50 mg by mouth  every 6 (six) hours as needed for itching.    Marland Kitchen. EPIPEN 2-PAK 0.3 MG/0.3ML SOAJ injection USE AS DIRECTED 2 Device 0  . ibuprofen (ADVIL,MOTRIN) 200 MG tablet Take 400-600 mg by mouth every 6 (six) hours as needed for headache or moderate pain (for pain).     Marland Kitchen. losartan-hydrochlorothiazide (HYZAAR) 50-12.5 MG tablet TAKE 1 TABLET BY MOUTH DAILY. 90 tablet 0  . Multiple Vitamin (MULTIVITAMIN) tablet Take 1 tablet by mouth daily.    . mupirocin cream (BACTROBAN) 2 % Apply 1 application topically 2 (two) times daily. 15 g 0  . traMADol (ULTRAM) 50 MG tablet TAKE 2 TABLETS BY MOUTH TWICE A DAY AS NEEDED FOR PAIN 120 tablet 1  . Biotin 1000 MCG tablet Take 5,000 mcg by mouth daily.    . clindamycin (CLEOCIN) 300 MG capsule Take 1 capsule (300 mg total) by mouth 3 (three) times daily. (Patient not taking: Reported on 09/23/2016) 21 capsule 0  . diclofenac (CATAFLAM) 50 MG tablet Take 1 tablet (50 mg total) by mouth 3 (three) times daily. (Patient not taking: Reported on 09/23/2016) 30 tablet 0  . predniSONE (DELTASONE) 20 MG tablet 3 tabs poqday 1-2, 2 tabs poqday 3-4, 1 tab poqday 5-6 (Patient not taking: Reported on 09/23/2016)  12 tablet 0   No facility-administered medications prior to visit.     Allergies:  Allergies  Allergen Reactions  . Augmentin [Amoxicillin-Pot Clavulanate] Diarrhea  . Bactrim [Sulfamethoxazole-Trimethoprim] Hives  . Bee Pollen Swelling    Also ticks   . Cephalosporins Hives  . Codeine Nausea Only  . Doxycycline Hives  . Tetracyclines & Related Hives  . Reflex Pain Relief [Menthol] Rash    Muscle Rub      Review of Systems: See HPI for pertinent ROS. All other ROS negative.    Physical Exam: Blood pressure 120/60, 87 pulse, temperature 98.7 F (37.1 C), temperature source Oral, resp. rate 18, height 4\' 9"  (1.448 m), weight 135 lb (61.2 kg), last menstrual period 12/26/2012, SpO2 97 %., Body mass index is 29.21 kg/m. General:  WNWD WF. Standing up, adjusting her  position constanttly throughout visit secondary to pain on left side. Appears in no acute distress. Neck: Supple. No thyromegaly. No lymphadenopathy. Lungs: Clear bilaterally to auscultation without wheezes, rales, or rhonchi. Breathing is unlabored. Heart: Regular rhythm. No murmurs, rubs, or gallops. Msk:  Strength and tone normal for age. Skin: Left low abdomen: There are several ~ 1 inch diameter areas of grouped vessicles. Left Low back: There are a few scattered papulovessicular lesions.  Neuro: Alert and oriented X 3. Moves all extremities spontaneously. Gait is normal. CNII-XII grossly in tact. Psych:  Responds to questions appropriately with a normal affect.     ASSESSMENT AND PLAN:  50 y.o. year old female with  1. Herpes zoster without complication She is to start the Valtrex immediately take as directed and complete all of it. She is to start the Neurontin today and titrate up as directed. Told her that once she gets up to taking one 3 times daily to call me and let me know whether the pain is controlled or not. If pain is not controlled with that dose,then she is to call me and I can give her instructions on how to further titrate up this dose to get pain control. Also given her some hydrocodone to use until the pain gets controlled with the Neurontin. Limited this to a 5 day supply. She is to stay away from any pregnant women. Discussed all of the above with patient at length and she voiced understanding and agrees. - valACYclovir (VALTREX) 1000 MG tablet; Take 1 tablet (1,000 mg total) by mouth 3 (three) times daily.  Dispense: 21 tablet; Refill: 0 - gabapentin (NEURONTIN) 300 MG capsule; Day 1: Take 1. Day 2: Take one twice a day. Day 3: Take one 3 times a day.  Dispense: 90 capsule; Refill: 3 - HYDROcodone-acetaminophen (NORCO/VICODIN) 5-325 MG tablet; Take 1 tablet by mouth every 6 (six) hours as needed.  Dispense: 20 tablet; Refill: 0   Signed, [address]Aleena Kirkeby Beth NoviceDixon, [place],  [facility] 09/23/2016 9:34 AM

## 2016-09-23 NOTE — Telephone Encounter (Signed)
Ok to refill 

## 2016-09-24 NOTE — Telephone Encounter (Signed)
ok 

## 2016-09-28 ENCOUNTER — Other Ambulatory Visit: Payer: Self-pay | Admitting: Family Medicine

## 2016-09-28 DIAGNOSIS — I10 Essential (primary) hypertension: Secondary | ICD-10-CM

## 2016-09-28 DIAGNOSIS — E785 Hyperlipidemia, unspecified: Secondary | ICD-10-CM

## 2016-09-28 DIAGNOSIS — Z79899 Other long term (current) drug therapy: Secondary | ICD-10-CM

## 2016-09-30 ENCOUNTER — Other Ambulatory Visit: Payer: Self-pay

## 2016-09-30 DIAGNOSIS — B029 Zoster without complications: Secondary | ICD-10-CM

## 2016-09-30 NOTE — Telephone Encounter (Signed)
Last OV 11-1 Last refill 11-1 #20 Okay to refill?

## 2016-10-01 ENCOUNTER — Other Ambulatory Visit: Payer: Medicare Other

## 2016-10-01 ENCOUNTER — Ambulatory Visit (INDEPENDENT_AMBULATORY_CARE_PROVIDER_SITE_OTHER): Payer: Medicare Other | Admitting: Family Medicine

## 2016-10-01 ENCOUNTER — Encounter: Payer: Self-pay | Admitting: Family Medicine

## 2016-10-01 VITALS — BP 146/68 | HR 100 | Temp 99.7°F | Resp 16 | Ht <= 58 in | Wt 140.0 lb

## 2016-10-01 DIAGNOSIS — E785 Hyperlipidemia, unspecified: Secondary | ICD-10-CM

## 2016-10-01 DIAGNOSIS — Z23 Encounter for immunization: Secondary | ICD-10-CM

## 2016-10-01 DIAGNOSIS — M1990 Unspecified osteoarthritis, unspecified site: Secondary | ICD-10-CM | POA: Diagnosis not present

## 2016-10-01 DIAGNOSIS — I10 Essential (primary) hypertension: Secondary | ICD-10-CM

## 2016-10-01 DIAGNOSIS — B029 Zoster without complications: Secondary | ICD-10-CM

## 2016-10-01 DIAGNOSIS — Z79899 Other long term (current) drug therapy: Secondary | ICD-10-CM

## 2016-10-01 LAB — COMPREHENSIVE METABOLIC PANEL
ALBUMIN: 3.7 g/dL (ref 3.6–5.1)
ALT: 32 U/L — ABNORMAL HIGH (ref 6–29)
AST: 28 U/L (ref 10–35)
Alkaline Phosphatase: 78 U/L (ref 33–130)
BILIRUBIN TOTAL: 0.5 mg/dL (ref 0.2–1.2)
BUN: 12 mg/dL (ref 7–25)
CHLORIDE: 99 mmol/L (ref 98–110)
CO2: 28 mmol/L (ref 20–31)
CREATININE: 0.89 mg/dL (ref 0.50–1.05)
Calcium: 8.6 mg/dL (ref 8.6–10.4)
GLUCOSE: 89 mg/dL (ref 70–99)
Potassium: 3.4 mmol/L — ABNORMAL LOW (ref 3.5–5.3)
SODIUM: 138 mmol/L (ref 135–146)
Total Protein: 6.3 g/dL (ref 6.1–8.1)

## 2016-10-01 LAB — CBC WITH DIFFERENTIAL/PLATELET
BASOS ABS: 73 {cells}/uL (ref 0–200)
Basophils Relative: 1 %
EOS PCT: 3 %
Eosinophils Absolute: 219 cells/uL (ref 15–500)
HCT: 37 % (ref 35.0–45.0)
Hemoglobin: 12.4 g/dL (ref 12.0–15.0)
LYMPHS ABS: 2409 {cells}/uL (ref 850–3900)
Lymphocytes Relative: 33 %
MCH: 30.4 pg (ref 27.0–33.0)
MCHC: 33.5 g/dL (ref 32.0–36.0)
MCV: 90.7 fL (ref 80.0–100.0)
MONOS PCT: 6 %
MPV: 9.6 fL (ref 7.5–12.5)
Monocytes Absolute: 438 cells/uL (ref 200–950)
NEUTROS ABS: 4161 {cells}/uL (ref 1500–7800)
Neutrophils Relative %: 57 %
PLATELETS: 289 10*3/uL (ref 140–400)
RBC: 4.08 MIL/uL (ref 3.80–5.10)
RDW: 14.4 % (ref 11.0–15.0)
WBC: 7.3 10*3/uL (ref 3.8–10.8)

## 2016-10-01 LAB — LIPID PANEL
Cholesterol: 135 mg/dL (ref <200)
HDL: 28 mg/dL — ABNORMAL LOW (ref >50)
LDL CALC: 73 mg/dL
TRIGLYCERIDES: 168 mg/dL — AB (ref <150)
Total CHOL/HDL Ratio: 4.8 Ratio (ref <5.0)
VLDL: 34 mg/dL — AB (ref <30)

## 2016-10-01 MED ORDER — HYDROCODONE-ACETAMINOPHEN 5-325 MG PO TABS: 1.0000 | ORAL_TABLET | Freq: Four times a day (QID) | ORAL | 0 refills | Status: DC | PRN

## 2016-10-01 MED ORDER — PANTOPRAZOLE SODIUM 40 MG PO TBEC: 40.0000 mg | DELAYED_RELEASE_TABLET | Freq: Every day | ORAL | 3 refills | Status: DC

## 2016-10-01 MED ORDER — CLONAZEPAM 1 MG PO TABS: 1.0000 mg | ORAL_TABLET | Freq: Three times a day (TID) | ORAL | 2 refills | Status: DC | PRN

## 2016-10-01 MED ORDER — MELOXICAM 15 MG PO TABS: 15.0000 mg | ORAL_TABLET | Freq: Every day | ORAL | 0 refills | Status: DC

## 2016-10-01 NOTE — Telephone Encounter (Signed)
I prescribed this medication once on 09/23/16 for her shingles. This medication does not need to be continued at this point.

## 2016-10-01 NOTE — Progress Notes (Signed)
Subjective:    Patient ID: Felicia [name], female    DOB: 04/13/1966, 50 y.o.   MRN: [id number]  HPI Patient presents today complaining of pain on her left flank. She has shingles in that area. The skin burns and aches and hurts. The rash is been there for more than 2 weeks. Gabapentin is providing no relief. Tramadol was not sufficient. She is also complaining of pain underneath her left patella and in her right hip. She has history of peripheral vascular disease and is on aspirin for that. She has a remote history of peptic ulcer disease. She is not on any proton pump inhibitor Past Medical History:  Diagnosis Date  . Allergy    year around  . Anemia   . Anxiety   . Arthritis   . Asthma   . Atrial fibrillation (HCC)   . Bipolar 1 disorder (HCC)   . Depression   . Ectopic pregnancy   . Headache   . Heart murmur   . Hyperlipidemia   . Insomnia   . Peripheral arterial disease (HCC)   . Pneumonia   . PUD (peptic ulcer disease)   . Right knee pain 12-05-2015   per pt.  she fell out of her husband's truck and landed on her right knee  . Shortness of breath dyspnea    Past Surgical History:  Procedure Laterality Date  . ABDOMINAL AORTAGRAM N/A 09/12/2012   Procedure: ABDOMINAL AORTAGRAM;  Surgeon: Fransisco HertzBrian L Chen, MD;  [facility];  Service: Cardiovascular;  Laterality: N/A;  . AORTA - BILATERAL FEMORAL ARTERY BYPASS GRAFT N/A 10/23/2014   Procedure: AORTOBIFEMORAL BYPASS GRAFT;  Surgeon: Chuck Hinthristopher S Dickson, MD;  [facility];  Service: Vascular;  Laterality: N/A;  . BACK SURGERY    . CESAREAN SECTION    . COLONOSCOPY  01-14-2004   > 11 yr ago Dr Loreta AveMann - normal - report in epic   . DILATATION & CURETTAGE/HYSTEROSCOPY WITH MYOSURE N/A 10/15/2015   Procedure: DILATATION & CURETTAGE/HYSTEROSCOPY WITH MYOSURE;  Surgeon: Ok EdwardsJuan H Fernandez, MD;  Location: [facility]S;  Service: Gynecology;  Laterality: N/A;  . ESOPHAGOGASTRODUODENOSCOPY  01-14-2004   gastritis- dr Loreta Avemann- in epic  already   . NOSE SURGERY    . NOSE SURGERY    . SHOULDER SURGERY     rotator cuff repair   . TONSILLECTOMY    . TUBAL LIGATION    . UPPER GASTROINTESTINAL ENDOSCOPY  01-14-2004   in epic- dr Loreta Avemann- gastritis    Current Outpatient Prescriptions on File Prior to Visit  Medication Sig Dispense Refill  . albuterol (PROVENTIL HFA;VENTOLIN HFA) 108 (90 BASE) MCG/ACT inhaler Inhale 2 puffs into the lungs every 6 (six) hours as needed. For shortness of breath 18 g 3  . aspirin 325 MG tablet Take 325 mg by mouth daily.    Marland Kitchen. atorvastatin (LIPITOR) 80 MG tablet TAKE 1 TABLET (80 MG TOTAL) BY MOUTH DAILY. 30 tablet 6  . beclomethasone (QVAR) 80 MCG/ACT inhaler Inhale 2 puffs into the lungs 2 (two) times daily.    . Biotin 1000 MCG tablet Take 5,000 mcg by mouth daily.    . cyclobenzaprine (FLEXERIL) 10 MG tablet TAKE ONE TABLET BY MOUTH THREE TIMES DAILY AS NEEDED FOR MUSCLE SPASMS 90 tablet 1  . diphenhydrAMINE (BENADRYL) 50 MG capsule Take 50 mg by mouth every 6 (six) hours as needed for itching.    Marland Kitchen. EPIPEN 2-PAK 0.3 MG/0.3ML SOAJ injection USE AS DIRECTED 2 Device 0  .  gabapentin (NEURONTIN) 300 MG capsule Day 1: Take 1. Day 2: Take one twice a day. Day 3: Take one 3 times a day. 90 capsule 3  . ibuprofen (ADVIL,MOTRIN) 200 MG tablet Take 400-600 mg by mouth every 6 (six) hours as needed for headache or moderate pain (for pain).     Marland Kitchen. losartan-hydrochlorothiazide (HYZAAR) 50-12.5 MG tablet TAKE 1 TABLET BY MOUTH DAILY. 90 tablet 0  . Multiple Vitamin (MULTIVITAMIN) tablet Take 1 tablet by mouth daily.    . mupirocin cream (BACTROBAN) 2 % Apply 1 application topically 2 (two) times daily. 15 g 0  . traMADol (ULTRAM) 50 MG tablet TAKE 2 TABLETS BY MOUTH TWICE A DAY AS NEEDED FOR PAIN 120 tablet 1   No current facility-administered medications on file prior to visit.    Allergies  Allergen Reactions  . Augmentin [Amoxicillin-Pot Clavulanate] Diarrhea  . Bactrim [Sulfamethoxazole-Trimethoprim]  Hives  . Bee Pollen Swelling    Also ticks   . Cephalosporins Hives  . Codeine Nausea Only  . Doxycycline Hives  . Tetracyclines & Related Hives  . Reflex Pain Relief [Menthol] Rash    Muscle Rub   Social History   Social History  . Marital status: Married    Spouse name: N/A  . Number of children: N/A  . Years of education: N/A   Occupational History  . Not on file.   Social History Main Topics  . Smoking status: Former Smoker    Packs/day: 0.25    Years: 30.00    Types: Cigarettes    Quit date: 10/19/2014  . Smokeless tobacco: Never Used     Comment: pt states that she is dealing with some stressful events in her life and as a result has began smoking about 3-4 cigs per day  . Alcohol use 0.6 oz/week    1 Shots of liquor per week     Comment: very occassionally on social occassions  . Drug use: No  . Sexual activity: Yes   Other Topics Concern  . Not on file   Social History Narrative  . No narrative on file      Review of Systems  All other systems reviewed and are negative.      Objective:   Physical Exam  Cardiovascular: Normal rate, regular rhythm and normal heart sounds.   Pulmonary/Chest: Effort normal and breath sounds normal. No respiratory distress. She has no wheezes. She has no rales.  Skin: Rash noted. There is erythema.  Vitals reviewed.         Assessment & Plan:  Arthritis - Plan: meloxicam (MOBIC) 15 MG tablet, pantoprazole (PROTONIX) 40 MG tablet  Herpes zoster without complication - Plan: HYDROcodone-acetaminophen (NORCO/VICODIN) 5-325 MG tablet  Needs flu shot - Plan: Flu Vaccine QUAD 36+ mos IM  Patient has osteoarthritis in the right hip as well as in her left knee. She also has low back pain secondary to degenerative disc disease. I believe she would benefit from an NSAID. I will prescribe meloxicam 15 mg by mouth daily. Given the fact she is on low-dose aspirin as well as a history remotely of peptic ulcer disease, also will  put her on proton asked 40 mg a day to take with the NSAID. I will give her a temporary prescription for hydrocodone/acetaminophen 5/325 one tablet every 6 hours as needed for postherpetic neuralgia. She also received her flu shot

## 2016-10-01 NOTE — Telephone Encounter (Signed)
Pt was in office today for lab work explained to pt Rx was denied. Pt asked what does she do about her pain? Then pt stated she has an Ov today and would ask [facility]

## 2016-10-02 ENCOUNTER — Other Ambulatory Visit: Payer: Self-pay | Admitting: Family Medicine

## 2016-10-23 ENCOUNTER — Other Ambulatory Visit: Payer: Self-pay | Admitting: Family Medicine

## 2016-10-23 NOTE — Telephone Encounter (Signed)
Approved. #[id number]. 

## 2016-10-23 NOTE — Telephone Encounter (Signed)
Ok to refill??      LRF 08/25/16 with 1 additional refill

## 2016-11-06 ENCOUNTER — Ambulatory Visit: Payer: Medicare Other | Admitting: Family Medicine

## 2016-11-20 ENCOUNTER — Other Ambulatory Visit: Payer: Self-pay | Admitting: Family Medicine

## 2016-11-20 ENCOUNTER — Telehealth: Payer: Self-pay | Admitting: Family Medicine

## 2016-11-20 MED ORDER — CYCLOBENZAPRINE HCL 10 MG PO TABS: ORAL_TABLET | ORAL | 1 refills | Status: DC

## 2016-11-20 NOTE — Telephone Encounter (Signed)
Per [facility] ok to refill [facility]

## 2016-11-20 NOTE — Telephone Encounter (Signed)
Patient has called the pharmacy regarding this refill also, she needs refill on flexeril will run out on Sunday, she forgot about Monday being a holiday  [facility] [place]

## 2016-12-01 ENCOUNTER — Other Ambulatory Visit: Payer: Self-pay | Admitting: Family Medicine

## 2016-12-17 ENCOUNTER — Telehealth: Payer: Self-pay | Admitting: Family Medicine

## 2016-12-17 NOTE — Telephone Encounter (Signed)
Pt called and states that she still has the bumps from shingles and wants to know if this is normal? They are not painful or itching or draining they are just there.

## 2016-12-18 ENCOUNTER — Other Ambulatory Visit: Payer: Self-pay | Admitting: Family Medicine

## 2016-12-18 DIAGNOSIS — M1990 Unspecified osteoarthritis, unspecified site: Secondary | ICD-10-CM

## 2016-12-18 MED ORDER — PANTOPRAZOLE SODIUM 40 MG PO TBEC: 40.0000 mg | DELAYED_RELEASE_TABLET | Freq: Every day | ORAL | 1 refills | Status: DC

## 2016-12-18 MED ORDER — MELOXICAM 15 MG PO TABS: 15.0000 mg | ORAL_TABLET | Freq: Every day | ORAL | 1 refills | Status: DC

## 2016-12-18 MED ORDER — LOSARTAN POTASSIUM-HCTZ 50-12.5 MG PO TABS: 1.0000 | ORAL_TABLET | Freq: Every day | ORAL | 3 refills | Status: DC

## 2016-12-18 MED ORDER — ATORVASTATIN CALCIUM 80 MG PO TABS: ORAL_TABLET | ORAL | 3 refills | Status: DC

## 2016-12-18 NOTE — Telephone Encounter (Signed)
Bumps and scars are possible for 1-2 months.  If concerned be glad to look at it.

## 2016-12-18 NOTE — Telephone Encounter (Signed)
Patient aware of providers recommendations.  

## 2016-12-22 ENCOUNTER — Other Ambulatory Visit: Payer: Self-pay | Admitting: Family Medicine

## 2016-12-22 NOTE — Telephone Encounter (Signed)
Ok to refill 

## 2016-12-23 NOTE — Telephone Encounter (Signed)
Medication called to pharmacy. 

## 2016-12-23 NOTE — Telephone Encounter (Signed)
ok 

## 2016-12-30 ENCOUNTER — Other Ambulatory Visit: Payer: Self-pay | Admitting: Family Medicine

## 2016-12-31 NOTE — Telephone Encounter (Signed)
ok 

## 2016-12-31 NOTE — Telephone Encounter (Signed)
Ok to refill 

## 2017-01-01 NOTE — Telephone Encounter (Signed)
Medication called to pharmacy. 

## 2017-01-17 ENCOUNTER — Other Ambulatory Visit: Payer: Self-pay | Admitting: Family Medicine

## 2017-02-22 ENCOUNTER — Other Ambulatory Visit: Payer: Self-pay | Admitting: Family Medicine

## 2017-02-22 NOTE — Telephone Encounter (Signed)
Ok to refill 

## 2017-02-23 NOTE — Telephone Encounter (Signed)
RX called in .

## 2017-02-23 NOTE — Telephone Encounter (Signed)
ok 

## 2017-03-17 ENCOUNTER — Other Ambulatory Visit: Payer: Self-pay | Admitting: Family Medicine

## 2017-03-17 NOTE — Telephone Encounter (Signed)
Ok to refill 

## 2017-03-18 ENCOUNTER — Other Ambulatory Visit: Payer: Self-pay | Admitting: Family Medicine

## 2017-03-18 MED ORDER — CYCLOBENZAPRINE HCL 10 MG PO TABS: ORAL_TABLET | ORAL | 3 refills | Status: DC

## 2017-03-18 NOTE — Telephone Encounter (Signed)
Prescription sent to pharmacy.

## 2017-03-18 NOTE — Telephone Encounter (Signed)
Ok with 3 rf 

## 2017-03-18 NOTE — Addendum Note (Signed)
Addended by: Legrand Rams B on: 03/18/2017 11:31 AM   Modules accepted: Orders

## 2017-03-31 ENCOUNTER — Other Ambulatory Visit: Payer: Self-pay | Admitting: Family Medicine

## 2017-04-01 NOTE — Telephone Encounter (Signed)
ok 

## 2017-04-01 NOTE — Telephone Encounter (Signed)
Ok to refill 

## 2017-04-02 NOTE — Telephone Encounter (Signed)
Medication called to pharmacy. 

## 2017-04-06 ENCOUNTER — Ambulatory Visit: Payer: Medicare Other | Admitting: Family Medicine

## 2017-04-07 ENCOUNTER — Encounter: Payer: Self-pay | Admitting: Gynecology

## 2017-04-23 ENCOUNTER — Other Ambulatory Visit: Payer: Self-pay | Admitting: Family Medicine

## 2017-04-23 NOTE — Telephone Encounter (Signed)
ok 

## 2017-04-23 NOTE — Telephone Encounter (Signed)
Last OV 11/09 Last refill 4/3 Ok to refill?

## 2017-04-27 NOTE — Telephone Encounter (Signed)
Medication called/sent to requested pharmacy  

## 2017-05-07 ENCOUNTER — Encounter: Payer: Self-pay | Admitting: Family Medicine

## 2017-05-07 ENCOUNTER — Ambulatory Visit (INDEPENDENT_AMBULATORY_CARE_PROVIDER_SITE_OTHER): Payer: Medicare Other | Admitting: Family Medicine

## 2017-05-07 VITALS — BP 128/80 | HR 100 | Temp 98.3°F

## 2017-05-07 DIAGNOSIS — T63461A Toxic effect of venom of wasps, accidental (unintentional), initial encounter: Secondary | ICD-10-CM | POA: Diagnosis not present

## 2017-05-07 MED ORDER — DIPHENHYDRAMINE HCL 50 MG/ML IJ SOLN
25.0000 mg | Freq: Once | INTRAMUSCULAR | Status: AC
  Administered 2017-05-07: 25 mg via INTRAMUSCULAR

## 2017-05-07 MED ORDER — METHYLPREDNISOLONE ACETATE 40 MG/ML IJ SUSP
60.0000 mg | Freq: Once | INTRAMUSCULAR | Status: AC
  Administered 2017-05-07: 60 mg via INTRAMUSCULAR

## 2017-05-07 MED ORDER — PREDNISONE 20 MG PO TABS: ORAL_TABLET | ORAL | 0 refills | Status: DC

## 2017-05-07 NOTE — Progress Notes (Signed)
Subjective:    Patient ID: Felicia [name], female    DOB: 08-Sep-1966, 51 y.o.   MRN: [id number]  HPI Patient was stung by wasp on the dorsum of her right wrist. On the back of her left calf and on the medial aspect of her left thigh shortly before coming here. All 3 areas are erythematous painful and swollen. She denies any shortness of breath. She is panicked and is breathing heavily because she is panicked that she is not wheezing. There is no swelling in her lips or in her tongue. There is no stridor. Patient was given 60 mg of Depo-Medrol and 25 mg IM of Benadryl upon arrival. She was then monitored in the office for approximately 1 hour. After calming down, the swelling in all 3 areas subsided. For over an hour she demonstrated no evidence of anaphylactic shock. Past Medical History:  Diagnosis Date  . Allergy    year around  . Anemia   . Anxiety   . Arthritis   . Asthma   . Atrial fibrillation (HCC)   . Bipolar 1 disorder (HCC)   . Depression   . Ectopic pregnancy   . Headache   . Heart murmur   . Hyperlipidemia   . Insomnia   . Peripheral arterial disease (HCC)   . Pneumonia   . PUD (peptic ulcer disease)   . Right knee pain 12-05-2015   per pt.  she fell out of her husband's truck and landed on her right knee  . Shortness of breath dyspnea    Past Surgical History:  Procedure Laterality Date  . ABDOMINAL AORTAGRAM N/A 09/12/2012   Procedure: ABDOMINAL AORTAGRAM;  Surgeon: Fransisco Hertz, MD;  Location: [facility] CATH LAB;  Service: Cardiovascular;  Laterality: N/A;  . AORTA - BILATERAL FEMORAL ARTERY BYPASS GRAFT N/A 10/23/2014   Procedure: AORTOBIFEMORAL BYPASS GRAFT;  Surgeon: Chuck Hint, MD;  Location: [facility] OR;  Service: Vascular;  Laterality: N/A;  . BACK SURGERY    . CESAREAN SECTION    . COLONOSCOPY  01-14-2004   > 11 yr ago Dr Loreta Ave - normal - report in epic   . DILATATION & CURETTAGE/HYSTEROSCOPY WITH MYOSURE N/A 10/15/2015   Procedure: DILATATION &  CURETTAGE/HYSTEROSCOPY WITH MYOSURE;  Surgeon: Ok Edwards, MD;  Location: [facility] ORS;  Service: Gynecology;  Laterality: N/A;  . ESOPHAGOGASTRODUODENOSCOPY  01-14-2004   gastritis- dr Loreta Ave- in epic already   . NOSE SURGERY    . NOSE SURGERY    . SHOULDER SURGERY     rotator cuff repair   . TONSILLECTOMY    . TUBAL LIGATION    . UPPER GASTROINTESTINAL ENDOSCOPY  01-14-2004   in epic- dr Loreta Ave- gastritis    Current Outpatient Prescriptions on File Prior to Visit  Medication Sig Dispense Refill  . albuterol (PROVENTIL HFA;VENTOLIN HFA) 108 (90 BASE) MCG/ACT inhaler Inhale 2 puffs into the lungs every 6 (six) hours as needed. For shortness of breath 18 g 3  . aspirin 325 MG tablet Take 325 mg by mouth daily.    Marland Kitchen atorvastatin (LIPITOR) 80 MG tablet TAKE 1 TABLET (80 MG TOTAL) BY MOUTH DAILY. 90 tablet 3  . beclomethasone (QVAR) 80 MCG/ACT inhaler Inhale 2 puffs into the lungs 2 (two) times daily.    . Biotin 1000 MCG tablet Take 5,000 mcg by mouth daily.    . clonazePAM (KLONOPIN) 1 MG tablet TAKE 1 TABLET BY MOUTH THREE TIMES A DAY AS NEEDED 90 tablet 2  .  cyclobenzaprine (FLEXERIL) 10 MG tablet TAKE 1 TABLET BY MOUTH 3 TIMES A DAY AS NEEDED FOR MUSCLE SPASMS 90 tablet 3  . diphenhydrAMINE (BENADRYL) 50 MG capsule Take 50 mg by mouth every 6 (six) hours as needed for itching.    Marland Kitchen EPIPEN 2-PAK 0.3 MG/0.3ML SOAJ injection USE AS DIRECTED 2 Device 0  . gabapentin (NEURONTIN) 300 MG capsule Day 1: Take 1. Day 2: Take one twice a day. Day 3: Take one 3 times a day. 90 capsule 3  . HYDROcodone-acetaminophen (NORCO/VICODIN) 5-325 MG tablet Take 1 tablet by mouth every 6 (six) hours as needed. 60 tablet 0  . ibuprofen (ADVIL,MOTRIN) 200 MG tablet Take 400-600 mg by mouth every 6 (six) hours as needed for headache or moderate pain (for pain).     Marland Kitchen losartan-hydrochlorothiazide (HYZAAR) 50-12.5 MG tablet Take 1 tablet by mouth daily. 90 tablet 3  . meloxicam (MOBIC) 15 MG tablet Take 1 tablet (15 mg  total) by mouth daily. 90 tablet 1  . Multiple Vitamin (MULTIVITAMIN) tablet Take 1 tablet by mouth daily.    . mupirocin cream (BACTROBAN) 2 % Apply 1 application topically 2 (two) times daily. 15 g 0  . pantoprazole (PROTONIX) 40 MG tablet Take 1 tablet (40 mg total) by mouth daily. 90 tablet 1  . traMADol (ULTRAM) 50 MG tablet TAKE 2 TABLETS TWICE A DAY AS NEEDED 120 tablet 1   No current facility-administered medications on file prior to visit.    Allergies  Allergen Reactions  . Augmentin [Amoxicillin-Pot Clavulanate] Diarrhea  . Bactrim [Sulfamethoxazole-Trimethoprim] Hives  . Bee Pollen Swelling    Also ticks   . Cephalosporins Hives  . Codeine Nausea Only  . Doxycycline Hives  . Tetracyclines & Related Hives  . Reflex Pain Relief [Menthol] Rash    Muscle Rub   Social History   Social History  . Marital status: Married    Spouse name: N/A  . Number of children: N/A  . Years of education: N/A   Occupational History  . Not on file.   Social History Main Topics  . Smoking status: Former Smoker    Packs/day: 0.25    Years: 30.00    Types: Cigarettes    Quit date: 10/19/2014  . Smokeless tobacco: Never Used     Comment: pt states that she is dealing with some stressful events in her life and as a result has began smoking about 3-4 cigs per day  . Alcohol use 0.6 oz/week    1 Shots of liquor per week     Comment: very occassionally on social occassions  . Drug use: No  . Sexual activity: Yes   Other Topics Concern  . Not on file   Social History Narrative  . No narrative on file      Review of Systems  All other systems reviewed and are negative.      Objective:   Physical Exam  Neck: No tracheal deviation present.  Cardiovascular: Normal rate, regular rhythm and normal heart sounds.   No murmur heard. Pulmonary/Chest: Effort normal and breath sounds normal. No stridor. No respiratory distress. She has no wheezes. She has no rales.  Lymphadenopathy:      She has no cervical adenopathy.  Skin: Rash noted. There is erythema.  Psychiatric: Her mood appears anxious.  Vitals reviewed.         Assessment & Plan:  Wasp sting, accidental or unintentional, initial encounter - Plan: predniSONE (DELTASONE) 20 MG tablet  Patient  was monitored for more than an hour in office with no evidence of anaphylactic reaction. She was feeling better. Therefore she was discharged home with a taper pack of prednisone and instructed to take Benadryl 25 mg every 6 hours as needed for itching and swelling. Sig medical attention immediately if situation changes.

## 2017-05-07 NOTE — Addendum Note (Signed)
Addended by: Legrand RamsWILLIS, SANDY B on: 05/07/2017 02:30 PM   Modules accepted: Orders

## 2017-05-11 ENCOUNTER — Ambulatory Visit: Payer: Medicare Other | Admitting: Family Medicine

## 2017-06-14 ENCOUNTER — Encounter: Payer: Self-pay | Admitting: Family

## 2017-06-23 ENCOUNTER — Ambulatory Visit (HOSPITAL_COMMUNITY)
Admission: RE | Admit: 2017-06-23 | Discharge: 2017-06-23 | Disposition: A | Discharge status: Home / Self Care | Payer: Medicare Other | Source: Ambulatory Visit | Attending: Vascular Surgery | Admitting: Vascular Surgery

## 2017-06-23 ENCOUNTER — Encounter: Payer: Self-pay | Admitting: Family

## 2017-06-23 ENCOUNTER — Ambulatory Visit (INDEPENDENT_AMBULATORY_CARE_PROVIDER_SITE_OTHER): Payer: Medicare Other | Admitting: Family

## 2017-06-23 VITALS — BP 137/70 | HR 103 | Temp 98.0°F | Resp 20 | Ht <= 58 in | Wt 142.1 lb

## 2017-06-23 DIAGNOSIS — F17200 Nicotine dependence, unspecified, uncomplicated: Secondary | ICD-10-CM | POA: Diagnosis not present

## 2017-06-23 DIAGNOSIS — Z95828 Presence of other vascular implants and grafts: Secondary | ICD-10-CM | POA: Diagnosis not present

## 2017-06-23 DIAGNOSIS — Z48812 Encounter for surgical aftercare following surgery on the circulatory system: Secondary | ICD-10-CM | POA: Insufficient documentation

## 2017-06-23 DIAGNOSIS — I7409 Other arterial embolism and thrombosis of abdominal aorta: Secondary | ICD-10-CM

## 2017-06-23 DIAGNOSIS — H93A1 Pulsatile tinnitus, right ear: Secondary | ICD-10-CM

## 2017-06-23 NOTE — Progress Notes (Signed)
[facility]    CC: Follow up peripheral artery occlusive disease  History of Present Illness Felicia [name] is a 51 y.o. female who is status post aorto bifemoral bypass graft on 10-23-14 by Dr. Edilia Bo for aortoiliac occlusive disease.   Dr. Edilia Bo last evaluated pt on 12-25-15. At that time lower extremity arterial Doppler study that was done on 12/18/2015 was reviewed. On the right side she had a triphasic posterior tibial signal and a biphasic dorsalis pedis signal. ABI was 86%. On the left side she had a triphasic posterior tibial signal and a biphasic dorsalis pedis signal, ABI was 84%. The patient was doing well status post aortobifemoral bypass graft. She was walking up to 5 miles a day. She had started smoking again but just quit again.  Dr. Edilia Bo encouraged her to stay as active as possible and try to stay off cigarettes completely, and advised pt to follow up with ABIs in 18 months and clinical evaluation at that time.   She has what seems like arthritis pain in both hips, wakes up with pain in hips, which resolves after she walks for a while; bad weather tends to worsen pain in hips and her shoulders.   She has had 6 surgeries on her right shoulder and one on her left shoulder. Her right arm strength is weak due to this.    Pt denies any history of stroke or TIA. She has a right carotid bruit, and states she has had carotid artery duplex years ago.   Pt Diabetic: No Pt smoker: smoker  (1/5 pp/day, started at age 53 yrs)  Pt meds include: Statin :Yes Betablocker: No ASA: Yes Other anticoagulants/antiplatelets: no  Past Medical History:  Diagnosis Date  . Allergy    year around  . Anemia   . Anxiety   . Arthritis   . Asthma   . Atrial fibrillation (HCC)   . Bipolar 1 disorder (HCC)   . Depression   . Ectopic pregnancy   . Headache   . Heart murmur   . Hyperlipidemia   . Insomnia   . Peripheral arterial disease (HCC)   . Pneumonia    . PUD (peptic ulcer disease)   . Right knee pain 12-05-2015   per pt.  she fell out of her husband's truck and landed on her right knee  . Shortness of breath dyspnea     Social History Social History  Substance Use Topics  . Smoking status: Current Every Day Smoker    Packs/day: 0.25    Years: 30.00    Types: Cigarettes    Last attempt to quit: 10/19/2014  . Smokeless tobacco: Never Used     Comment: pt states that she is dealing with some stressful events in her life and as a result has began smoking about 3-4 cigs per day  . Alcohol use 0.6 oz/week    1 Shots of liquor per week     Comment: very occassionally on social occassions    Family History Family History  Problem Relation Age of Onset  . Adopted: Yes  . Heart disease Mother   . Diabetes Mother   . Hyperlipidemia Mother   . Hypertension Mother   . Heart attack Mother   . Heart disease Father   . Diabetes Father   . Heart attack Father   . Hypertension Father   . Bladder Cancer Father   . Diabetes Brother   . Lung cancer Paternal Grandfather   .  Colon cancer Neg Hx   . Colon polyps Neg Hx   . Rectal cancer Neg Hx   . Stomach cancer Neg Hx     Past Surgical History:  Procedure Laterality Date  . ABDOMINAL AORTAGRAM N/A 09/12/2012   Procedure: ABDOMINAL AORTAGRAM;  Surgeon: Fransisco Hertz, MD;  Location: [facility] CATH LAB;  Service: Cardiovascular;  Laterality: N/A;  . AORTA - BILATERAL FEMORAL ARTERY BYPASS GRAFT N/A 10/23/2014   Procedure: AORTOBIFEMORAL BYPASS GRAFT;  Surgeon: Chuck Hint, MD;  [facility]: [facility] OR;  Service: Vascular;  Laterality: N/A;  . BACK SURGERY    . CESAREAN SECTION    . COLONOSCOPY  01-14-2004   > 11 yr ago Dr Loreta Ave - normal - report in epic   . DILATATION & CURETTAGE/HYSTEROSCOPY WITH MYOSURE N/A 10/15/2015   Procedure: DILATATION & CURETTAGE/HYSTEROSCOPY WITH MYOSURE;  Surgeon: Ok Edwards, MD;  Location: [facility] ORS;  Service: Gynecology;  Laterality: N/A;  .  ESOPHAGOGASTRODUODENOSCOPY  01-14-2004   gastritis- dr Loreta Ave- in epic already   . NOSE SURGERY    . NOSE SURGERY    . SHOULDER SURGERY     rotator cuff repair   . TONSILLECTOMY    . TUBAL LIGATION    . UPPER GASTROINTESTINAL ENDOSCOPY  01-14-2004   in epic- dr Loreta Ave- gastritis     Allergies  Allergen Reactions  . Augmentin [Amoxicillin-Pot Clavulanate] Diarrhea  . Bactrim [Sulfamethoxazole-Trimethoprim] Hives  . Bee Pollen Swelling    Also ticks   . Cephalosporins Hives  . Codeine Nausea Only  . Doxycycline Hives  . Tetracyclines & Related Hives  . Reflex Pain Relief [Menthol] Rash    Muscle Rub    Current Outpatient Prescriptions  Medication Sig Dispense Refill  . albuterol (PROVENTIL HFA;VENTOLIN HFA) 108 (90 BASE) MCG/ACT inhaler Inhale 2 puffs into the lungs every 6 (six) hours as needed. For shortness of breath 18 g 3  . aspirin 325 MG tablet Take 325 mg by mouth daily.    Marland Kitchen atorvastatin (LIPITOR) 80 MG tablet TAKE 1 TABLET (80 MG TOTAL) BY MOUTH DAILY. 90 tablet 3  . beclomethasone (QVAR) 80 MCG/ACT inhaler Inhale 2 puffs into the lungs 2 (two) times daily.    . clonazePAM (KLONOPIN) 1 MG tablet TAKE 1 TABLET BY MOUTH THREE TIMES A DAY AS NEEDED 90 tablet 2  . cyclobenzaprine (FLEXERIL) 10 MG tablet TAKE 1 TABLET BY MOUTH 3 TIMES A DAY AS NEEDED FOR MUSCLE SPASMS 90 tablet 3  . EPIPEN 2-PAK 0.3 MG/0.3ML SOAJ injection USE AS DIRECTED 2 Device 0  . ibuprofen (ADVIL,MOTRIN) 200 MG tablet Take 400-600 mg by mouth every 6 (six) hours as needed for headache or moderate pain (for pain).     Marland Kitchen losartan-hydrochlorothiazide (HYZAAR) 50-12.5 MG tablet Take 1 tablet by mouth daily. 90 tablet 3  . meloxicam (MOBIC) 15 MG tablet Take 1 tablet (15 mg total) by mouth daily. 90 tablet 1  . Multiple Vitamin (MULTIVITAMIN) tablet Take 1 tablet by mouth daily.    . traMADol (ULTRAM) 50 MG tablet TAKE 2 TABLETS TWICE A DAY AS NEEDED 120 tablet 1  . Biotin 1000 MCG tablet Take 5,000 mcg by mouth  daily.    . diphenhydrAMINE (BENADRYL) 50 MG capsule Take 50 mg by mouth every 6 (six) hours as needed for itching.    . gabapentin (NEURONTIN) 300 MG capsule Day 1: Take 1. Day 2: Take one twice a day. Day 3: Take one 3 times a day. (Patient not taking:  Reported on 06/23/2017) 90 capsule 3  . HYDROcodone-acetaminophen (NORCO/VICODIN) 5-325 MG tablet Take 1 tablet by mouth every 6 (six) hours as needed. (Patient not taking: Reported on 06/23/2017) 60 tablet 0  . mupirocin cream (BACTROBAN) 2 % Apply 1 application topically 2 (two) times daily. (Patient not taking: Reported on 06/23/2017) 15 g 0  . pantoprazole (PROTONIX) 40 MG tablet Take 1 tablet (40 mg total) by mouth daily. (Patient not taking: Reported on 06/23/2017) 90 tablet 1  . predniSONE (DELTASONE) 20 MG tablet 3 tabs poqday 1-2, 2 tabs poqday 3-4, 1 tab poqday 5-6 (Patient not taking: Reported on 06/23/2017) 12 tablet 0   No current facility-administered medications for this visit.     ROS: See HPI for pertinent positives and negatives.   Physical Examination  Vitals:   06/23/17 1132  BP: 137/70  Pulse: (!) 103  Resp: 20  Temp: 98 F (36.7 C)  TempSrc: Oral  SpO2: 96%  Weight: 142 lb 1.6 oz (64.5 kg)  Height: 4\' 9"  (1.448 m)   Body mass index is 30.75 kg/m.  General: A&O x 3, WDWN, obese female. Gait: normal Eyes: PERRLA. Pulmonary: Respirations are non labored, CTAB, good air movement Cardiac: regular rhythm, tachycardic at 103, no detected murmur.         Carotid Bruits Right Left   Positive Negative   Radial pulses are 2+ palpable bilaterally   Adominal aortic pulse is not palpable                         VASCULAR EXAM: Extremities without ischemic changes, without Gangrene; without open wounds.                                                                                                          LE Pulses Right Left       FEMORAL  faintly palpable, tender, no swelling  faintly palpable, tender to palpation         POPLITEAL  not palpable   not palpable       POSTERIOR TIBIAL  2+ palpable   2+ palpable        DORSALIS PEDIS      ANTERIOR TIBIAL 1+ palpable  1+ palpable    Abdomen: soft, NT, no palpable masses. Skin: no rashes, no ulcers noted. Musculoskeletal: no muscle wasting or atrophy. Mild swelling at dorsum of left foot since twisting her ankle in March 2018.  Neurologic: A&O X 3; Appropriate Affect ; SENSATION: normal; MOTOR FUNCTION:  moving all extremities equally, motor strength 5/5 throughout except 3/5 in right UE. Speech is fluent/normal. CN 2-12 intact.    ASSESSMENT: Felicia [name] is a 51 y.o. female who is status post aorto bifemoral bypass graft on 10-23-14 by Dr. Edilia Boickson for aortoiliac occlusive disease. She no longer has claudication symptoms with walking, but seems to have bilateral OA hip pain in the morning that resolves shortly after walking.   Pt has worsening pulsatile tinnitus in her right ear for several months, has a right carotid  bruit, she also has sinus congestion x 3 weeks She will see her PCP re the sinus headaches and congestion, will schedule carotid duplex in a couple weeks here. She has no known hx of stroke or TIA. [facility]otid duplex from [facility] showed <50% bilateral ICA stenosis.   Fortunately she does not have DM and stays physically active. Unfortunately she continues to smoke, but decreased to about 1/5 ppd, started at age 51.   DATA  ABI (Date: 06/23/2017)  R:   ABI: 1.01 (was 0.86 on 12-18-15),   PT: tri  DP: tri  TBI:  0.71  L:   ABI: 0.99 (was 0.84),   PT: tri  DP: tri  TBI: 0.80   Carotid Duplex (06[facility]); RIGHT CAROTID ARTERY: Mild diffuse intimal thickening.  Mild calcified plaque in the carotid bulb and proximal ICA.  No significant stenosis, less than 50%. RIGHT VERTEBRAL ARTERY:  Antegrade flow. LEFT CAROTID ARTERY: Mild diffuse intimal thickening.  Mild calcific plaque in  the carotid bulb and proximal ICA.  No significant stenosis, less 50%. LEFT VERTEBRAL ARTERY:  Antegrade flow.    PLAN:  Based on the patient's vascular studies and examination, pt will return to clinic in 2 weeks with carotid duplex, 18 months for ABI's.  The patient was counseled re smoking cessation and given several free resources re smoking cessation. Continue extensive walking and dancing.   I discussed in depth with the patient the nature of atherosclerosis, and emphasized the importance of maximal medical management including strict control of blood pressure, blood glucose, and lipid levels, obtaining regular exercise, and cessation of smoking.  The patient is aware that without maximal medical management the underlying atherosclerotic disease process will progress, limiting the benefit of any interventions.  The patient was given information about PAD including signs, symptoms, treatment, what symptoms should prompt the patient to seek immediate medical care, and risk reduction measures to take.  Charisse MarchSuzanne Rajanae Mantia, RN, MSN, FNP-C [facility] [place] Office Phone: [phone]  Clinic MD: Edilia BoDickson  06/23/17 11:39 AM

## 2017-06-23 NOTE — Patient Instructions (Addendum)
Peripheral Vascular Disease Peripheral vascular disease (PVD) is a disease of the blood vessels that are not part of your heart and brain. A simple term for PVD is poor circulation. In most cases, PVD narrows the blood vessels that carry blood from your heart to the rest of your body. This can result in a decreased supply of blood to your arms, legs, and internal organs, like your stomach or kidneys. However, it most often affects a person's lower legs and feet. There are two types of PVD.  Organic PVD. This is the more common type. It is caused by damage to the structure of blood vessels.  Functional PVD. This is caused by conditions that make blood vessels contract and tighten (spasm).  Without treatment, PVD tends to get worse over time. PVD can also lead to acute ischemic limb. This is when an arm or limb suddenly has trouble getting enough blood. This is a medical emergency. Follow these instructions at home:  Take medicines only as told by your doctor.  Do not use any tobacco products, including cigarettes, chewing tobacco, or electronic cigarettes. If you need help quitting, ask your doctor.  Lose weight if you are overweight, and maintain a healthy weight as told by your doctor.  Eat a diet that is low in fat and cholesterol. If you need help, ask your doctor.  Exercise regularly. Ask your doctor for some good activities for you.  Take good care of your feet. ? Wear comfortable shoes that fit well. ? Check your feet often for any cuts or sores. Contact a doctor if:  You have cramps in your legs while walking.  You have leg pain when you are at rest.  You have coldness in a leg or foot.  Your skin changes.  You are unable to get or have an erection (erectile dysfunction).  You have cuts or sores on your feet that are not healing. Get help right away if:  Your arm or leg turns cold and blue.  Your arms or legs become red, warm, swollen, painful, or numb.  You have  chest pain or trouble breathing.  You suddenly have weakness in your face, arm, or leg.  You become very confused or you cannot speak.  You suddenly have a very bad headache.  You suddenly cannot see. This information is not intended to replace advice given to you by your health care provider. Make sure you discuss any questions you have with your health care provider. Document Released: 02/03/2010 Document Revised: 04/16/2016 Document Reviewed: 04/19/2014 Elsevier Interactive Patient Education  2017 Elsevier Inc.      Steps to Quit Smoking Smoking tobacco can be bad for your health. It can also affect almost every organ in your body. Smoking puts you and people around you at risk for many serious long-lasting (chronic) diseases. Quitting smoking is hard, but it is one of the best things that you can do for your health. It is never too late to quit. What are the benefits of quitting smoking? When you quit smoking, you lower your risk for getting serious diseases and conditions. They can include:  Lung cancer or lung disease.  Heart disease.  Stroke.  Heart attack.  Not being able to have children (infertility).  Weak bones (osteoporosis) and broken bones (fractures).  If you have coughing, wheezing, and shortness of breath, those symptoms may get better when you quit. You may also get sick less often. If you are pregnant, quitting smoking can help to lower   your chances of having a baby of low birth weight. What can I do to help me quit smoking? Talk with your doctor about what can help you quit smoking. Some things you can do (strategies) include:  Quitting smoking totally, instead of slowly cutting back how much you smoke over a period of time.  Going to in-person counseling. You are more likely to quit if you go to many counseling sessions.  Using resources and support systems, such as: ? Online chats with a counselor. ? Phone quitlines. ? Printed self-help  materials. ? Support groups or group counseling. ? Text messaging programs. ? Mobile phone apps or applications.  Taking medicines. Some of these medicines may have nicotine in them. If you are pregnant or breastfeeding, do not take any medicines to quit smoking unless your doctor says it is okay. Talk with your doctor about counseling or other things that can help you.  Talk with your doctor about using more than one strategy at the same time, such as taking medicines while you are also going to in-person counseling. This can help make quitting easier. What things can I do to make it easier to quit? Quitting smoking might feel very hard at first, but there is a lot that you can do to make it easier. Take these steps:  Talk to your family and friends. Ask them to support and encourage you.  Call phone quitlines, reach out to support groups, or work with a counselor.  Ask people who smoke to not smoke around you.  Avoid places that make you want (trigger) to smoke, such as: ? Bars. ? Parties. ? Smoke-break areas at work.  Spend time with people who do not smoke.  Lower the stress in your life. Stress can make you want to smoke. Try these things to help your stress: ? Getting regular exercise. ? Deep-breathing exercises. ? Yoga. ? Meditating. ? Doing a body scan. To do this, close your eyes, focus on one area of your body at a time from head to toe, and notice which parts of your body are tense. Try to relax the muscles in those areas.  Download or buy apps on your mobile phone or tablet that can help you stick to your quit plan. There are many free apps, such as QuitGuide from the CDC (Centers for Disease Control and Prevention). You can find more support from smokefree.gov and other websites.  This information is not intended to replace advice given to you by your health care provider. Make sure you discuss any questions you have with your health care provider. Document Released:  09/05/2009 Document Revised: 07/07/2016 Document Reviewed: 03/26/2015 Elsevier Interactive Patient Education  2018 Elsevier Inc.  

## 2017-06-26 ENCOUNTER — Other Ambulatory Visit: Payer: Self-pay | Admitting: Family Medicine

## 2017-06-26 DIAGNOSIS — M1990 Unspecified osteoarthritis, unspecified site: Secondary | ICD-10-CM

## 2017-06-29 ENCOUNTER — Other Ambulatory Visit: Payer: Self-pay | Admitting: Family Medicine

## 2017-06-30 NOTE — Telephone Encounter (Signed)
Ok to refill 

## 2017-07-01 NOTE — Telephone Encounter (Signed)
ok 

## 2017-07-01 NOTE — Telephone Encounter (Signed)
Medication called to pharmacy. 

## 2017-07-06 ENCOUNTER — Other Ambulatory Visit: Payer: Self-pay | Admitting: Family Medicine

## 2017-07-06 NOTE — Telephone Encounter (Signed)
ok 

## 2017-07-06 NOTE — Telephone Encounter (Signed)
Ok to refill 

## 2017-07-07 NOTE — Telephone Encounter (Signed)
Medication called to pharmacy. 

## 2017-07-09 ENCOUNTER — Encounter (HOSPITAL_COMMUNITY): Payer: Medicare Other

## 2017-07-09 ENCOUNTER — Encounter: Payer: Self-pay | Admitting: Family

## 2017-07-09 ENCOUNTER — Ambulatory Visit: Payer: Medicare Other | Admitting: Family

## 2017-07-21 ENCOUNTER — Ambulatory Visit: Payer: Medicare Other | Admitting: Family

## 2017-07-21 ENCOUNTER — Encounter (HOSPITAL_COMMUNITY): Payer: Medicare Other

## 2017-08-02 ENCOUNTER — Other Ambulatory Visit: Payer: Self-pay | Admitting: Family Medicine

## 2017-08-03 NOTE — Telephone Encounter (Signed)
ok 

## 2017-08-03 NOTE — Telephone Encounter (Signed)
Prescription sent to pharmacy.

## 2017-08-03 NOTE — Telephone Encounter (Signed)
Ok to refill 

## 2017-08-19 ENCOUNTER — Encounter: Payer: Self-pay | Admitting: Family Medicine

## 2017-08-20 ENCOUNTER — Ambulatory Visit: Payer: Medicare Other | Admitting: Family Medicine

## 2017-08-23 ENCOUNTER — Ambulatory Visit: Payer: Medicare Other | Admitting: Family Medicine

## 2017-08-30 ENCOUNTER — Other Ambulatory Visit: Payer: Self-pay | Admitting: Family Medicine

## 2017-08-30 NOTE — Telephone Encounter (Signed)
rx called into pharmacy

## 2017-08-30 NOTE — Telephone Encounter (Signed)
Last OV 10/01/2016 Last refill 8/92018 Ok to refill?

## 2017-08-30 NOTE — Telephone Encounter (Signed)
ok 

## 2017-09-23 ENCOUNTER — Encounter: Payer: Self-pay | Admitting: Family Medicine

## 2017-10-04 ENCOUNTER — Other Ambulatory Visit: Payer: Self-pay | Admitting: Family Medicine

## 2017-10-05 ENCOUNTER — Telehealth: Payer: Self-pay | Admitting: Family Medicine

## 2017-10-05 MED ORDER — CLONAZEPAM 1 MG PO TABS: 1.0000 mg | ORAL_TABLET | Freq: Three times a day (TID) | ORAL | 2 refills | Status: DC

## 2017-10-05 NOTE — Telephone Encounter (Signed)
ok 

## 2017-10-05 NOTE — Telephone Encounter (Signed)
Requesting refill on Klonopin - Ok to refill??       

## 2017-10-05 NOTE — Telephone Encounter (Signed)
Medication called/sent to requested pharmacy  

## 2017-10-28 ENCOUNTER — Other Ambulatory Visit: Payer: Self-pay | Admitting: Family Medicine

## 2017-10-28 NOTE — Telephone Encounter (Signed)
Medication called to pharmacy. 

## 2017-10-28 NOTE — Telephone Encounter (Signed)
ok 

## 2017-10-28 NOTE — Telephone Encounter (Signed)
Ok to refill 

## 2017-11-22 ENCOUNTER — Other Ambulatory Visit: Payer: Self-pay | Admitting: Family Medicine

## 2017-11-22 NOTE — Telephone Encounter (Signed)
Ok to refill 

## 2017-12-27 ENCOUNTER — Other Ambulatory Visit: Payer: Self-pay | Admitting: Family Medicine

## 2017-12-27 ENCOUNTER — Ambulatory Visit: Payer: Medicare Other | Admitting: Family Medicine

## 2017-12-27 ENCOUNTER — Encounter: Payer: Self-pay | Admitting: Family Medicine

## 2017-12-27 VITALS — BP 142/90 | HR 110 | Temp 98.3°F | Resp 18 | Ht <= 58 in | Wt 140.0 lb

## 2017-12-27 DIAGNOSIS — M1990 Unspecified osteoarthritis, unspecified site: Secondary | ICD-10-CM

## 2017-12-27 DIAGNOSIS — M25511 Pain in right shoulder: Secondary | ICD-10-CM | POA: Diagnosis not present

## 2017-12-27 DIAGNOSIS — M545 Low back pain: Secondary | ICD-10-CM

## 2017-12-27 DIAGNOSIS — G8929 Other chronic pain: Secondary | ICD-10-CM

## 2017-12-27 DIAGNOSIS — M75121 Complete rotator cuff tear or rupture of right shoulder, not specified as traumatic: Secondary | ICD-10-CM

## 2017-12-27 MED ORDER — MELOXICAM 15 MG PO TABS: 15.0000 mg | ORAL_TABLET | Freq: Every day | ORAL | 0 refills | Status: DC

## 2017-12-27 MED ORDER — KETOROLAC TROMETHAMINE 10 MG PO TABS: 10.0000 mg | ORAL_TABLET | Freq: Four times a day (QID) | ORAL | 0 refills | Status: DC | PRN

## 2017-12-27 NOTE — Progress Notes (Addendum)
Subjective:    Patient ID: Felicia [name], female    DOB: 04/06/1966, 52 y.o.   MRN: [id number]  HPI Patient has a history of chronic right shoulder pain.  MRI of the right shoulder in 2015 revealed:  IMPRESSION: 1. Findings are consistent with a chronic recurrent full-thickness rotator cuff tear involving the subscapularis, supraspinatus and infraspinous tendons. There is associated tendon retraction and marked muscular atrophy. 2. Nonvisualization of the biceps tendon, likely due to prior bicipital tenotomy or remote tear. 3. Blunting of the superior labrum consistent with prior labral debridement or chronic superior labral tear. 4. No acute osseous findings. She states that 3 weeks ago, the pain in her right shoulder suddenly died without reason.  She now has severe pain.  She is unable to abduct her arm greater than 45 degrees without pain.  She has pain with internal and external rotation.  Hawking sign elicits severe pain.  She is unable to abduct her arm against any resistance.  She also complains of low back pain that has suddenly worsened.  Patient has a history of previous decompressive surgeries on her lumbar spine x2.  MRI in 2016 revealed surgical changes but no acute abnormalities.  She complains of worsening low back pain.  Patient has been treated for chronic pain with tramadol in the past.  However recently she has been using more tramadol than prescribed and is out of her medication a week earlier than otherwise.  She is here today to address these 2 concerns Past Medical History:  Diagnosis Date  . Allergy    year around  . Anemia   . Anxiety   . Arthritis   . Asthma   . Atrial fibrillation (HCC)   . Bipolar 1 disorder (HCC)   . Depression   . Ectopic pregnancy   . Headache   . Heart murmur   . Hyperlipidemia   . Insomnia   . Peripheral arterial disease (HCC)   . Pneumonia   . PUD (peptic ulcer disease)   . Right knee pain 12-05-2015   per pt.  she fell out  of her husband's truck and landed on her right knee  . Shortness of breath dyspnea    Past Surgical History:  Procedure Laterality Date  . ABDOMINAL AORTAGRAM N/A 09/12/2012   Procedure: ABDOMINAL AORTAGRAM;  Surgeon: Fransisco HertzBrian L Chen, MD;  [facility];  Service: Cardiovascular;  Laterality: N/A;  . AORTA - BILATERAL FEMORAL ARTERY BYPASS GRAFT N/A 10/23/2014   Procedure: AORTOBIFEMORAL BYPASS GRAFT;  Surgeon: Chuck Hinthristopher S Dickson, MD;  [facility];  Service: Vascular;  Laterality: N/A;  . BACK SURGERY    . CESAREAN SECTION    . COLONOSCOPY  01-14-2004   > 11 yr ago Dr Loreta AveMann - normal - report in epic   . DILATATION & CURETTAGE/HYSTEROSCOPY WITH MYOSURE N/A 10/15/2015   Procedure: DILATATION & CURETTAGE/HYSTEROSCOPY WITH MYOSURE;  Surgeon: Ok EdwardsJuan H Fernandez, MD;  Location: [facility] ORS;  Service: Gynecology;  Laterality: N/A;  . ESOPHAGOGASTRODUODENOSCOPY  01-14-2004   gastritis- dr Loreta Avemann- in epic already   . NOSE SURGERY    . NOSE SURGERY    . SHOULDER SURGERY     rotator cuff repair   . TONSILLECTOMY    . TUBAL LIGATION    . UPPER GASTROINTESTINAL ENDOSCOPY  01-14-2004   in epic- dr Loreta Avemann- gastritis    Current Outpatient Medications on File Prior to Visit  Medication Sig Dispense Refill  . albuterol (PROVENTIL HFA;VENTOLIN HFA) 108 (  90 BASE) MCG/ACT inhaler Inhale 2 puffs into the lungs every 6 (six) hours as needed. For shortness of breath 18 g 3  . aspirin 325 MG tablet Take 325 mg by mouth daily.    . beclomethasone (QVAR) 80 MCG/ACT inhaler Inhale 2 puffs into the lungs 2 (two) times daily.    . clonazePAM (KLONOPIN) 1 MG tablet Take 1 tablet (1 mg total) 3 (three) times daily by mouth. 90 tablet 2  . cyclobenzaprine (FLEXERIL) 10 MG tablet TAKE 1 TABLET BY MOUTH 3 TIMES A DAY AS NEEDED FOR MUSCLE SPASMS 90 tablet 3  . diphenhydrAMINE (BENADRYL) 50 MG capsule Take 50 mg by mouth every 6 (six) hours as needed for itching.    Marland Kitchen EPIPEN 2-PAK 0.3 MG/0.3ML SOAJ injection USE AS  DIRECTED 2 Device 0  . traMADol (ULTRAM) 50 MG tablet TAKE 2 TABLETS TWICE A DAY AS NEEDED FOR PAIN 120 tablet 1  . atorvastatin (LIPITOR) 80 MG tablet TAKE 1 TABLET (80 MG TOTAL) BY MOUTH DAILY. (Patient not taking: Reported on 12/27/2017) 90 tablet 3  . losartan-hydrochlorothiazide (HYZAAR) 50-12.5 MG tablet Take 1 tablet by mouth daily. (Patient not taking: Reported on 12/27/2017) 90 tablet 3  . pantoprazole (PROTONIX) 40 MG tablet Take 1 tablet (40 mg total) by mouth daily. (Patient not taking: Reported on 06/23/2017) 90 tablet 1   No current facility-administered medications on file prior to visit.    Allergies  Allergen Reactions  . Augmentin [Amoxicillin-Pot Clavulanate] Diarrhea  . Bactrim [Sulfamethoxazole-Trimethoprim] Hives  . Bee Pollen Swelling    Also ticks   . Cephalosporins Hives  . Codeine Nausea Only  . Doxycycline Hives  . Tetracyclines & Related Hives  . Reflex Pain Relief [Menthol] Rash    Muscle Rub   Social History   Socioeconomic History  . Marital status: Married    Spouse name: Not on file  . Number of children: Not on file  . Years of education: Not on file  . Highest education level: Not on file  Social Needs  . Financial resource strain: Not on file  . Food insecurity - worry: Not on file  . Food insecurity - inability: Not on file  . Transportation needs - medical: Not on file  . Transportation needs - non-medical: Not on file  Occupational History  . Not on file  Tobacco Use  . Smoking status: Current Every Day Smoker    Packs/day: 0.25    Years: 30.00    Pack years: 7.50    Types: Cigarettes    Last attempt to quit: 10/19/2014    Years since quitting: 3.1  . Smokeless tobacco: Never Used  . Tobacco comment: pt states that she is dealing with some stressful events in her life and as a result has began smoking about 3-4 cigs per day  Substance and Sexual Activity  . Alcohol use: Yes    Alcohol/week: 0.6 oz    Types: 1 Shots of liquor per week      Comment: very occassionally on social occassions  . Drug use: No  . Sexual activity: Yes  Other Topics Concern  . Not on file  Social History Narrative  . Not on file      Review of Systems     Objective:   Physical Exam  Constitutional: She appears well-developed and well-nourished.  Cardiovascular: Normal rate and normal heart sounds.  Pulmonary/Chest: Effort normal and breath sounds normal.  Musculoskeletal:       Right shoulder: She  exhibits decreased range of motion, tenderness, bony tenderness, pain and decreased strength.       Lumbar back: She exhibits decreased range of motion, tenderness and pain.  Vitals reviewed.         Assessment & Plan:  Chronic right shoulder pain secondary to rotator cuff tear, degenerative disc disease in the lumbar spine, chronic low back pain.  Using sterile technique, I injected the right shoulder with 2 cc of lidocaine, 2 cc of Marcaine, and 2 cc of 40 mg/mL Kenalog.  Patient tolerated the procedure well without complication.  I will treat her low back pain with Toradol 10 mg every 6 hours as needed for 4 days.  Recheck in 1 week if no better or sooner if worse.   5 minutes after leaving, patient received word from [facility] that toradol would not be covered.  Switch to mobic 15 mg poqday instead.

## 2017-12-29 ENCOUNTER — Other Ambulatory Visit: Payer: Self-pay | Admitting: Family Medicine

## 2017-12-29 DIAGNOSIS — M1990 Unspecified osteoarthritis, unspecified site: Secondary | ICD-10-CM

## 2017-12-29 MED ORDER — MELOXICAM 15 MG PO TABS: 15.0000 mg | ORAL_TABLET | Freq: Every day | ORAL | 0 refills | Status: DC

## 2017-12-29 NOTE — Telephone Encounter (Signed)
Ok to refill??  Last office visit 12/27/2017.  Last refill on Klonopin 10/05/2017, #2 refills.   Last refill on Tramadol 10/28/2017, #1 refill.

## 2018-01-05 ENCOUNTER — Telehealth: Payer: Self-pay | Admitting: Family Medicine

## 2018-01-05 NOTE — Telephone Encounter (Signed)
Due to Klonopin 1 mg being on back  - we changed to .5mg  take 2 TID however her ins will not cover a full month like that and the pharm wanted to know if we could change it to 2mg  tablet 1/2 tab tid?????

## 2018-01-06 ENCOUNTER — Other Ambulatory Visit: Payer: Self-pay | Admitting: Family Medicine

## 2018-01-06 MED ORDER — CLONAZEPAM 2 MG PO TABS: 1.0000 mg | ORAL_TABLET | Freq: Two times a day (BID) | ORAL | 0 refills | Status: DC

## 2018-01-06 NOTE — Telephone Encounter (Signed)
Ok 2 mg tabs, 1/2 tab tid (45 tabs).

## 2018-01-06 NOTE — Telephone Encounter (Signed)
OK please send pt will be out sat.

## 2018-01-10 ENCOUNTER — Encounter: Payer: Medicare Other | Admitting: Family Medicine

## 2018-01-11 ENCOUNTER — Encounter: Payer: Self-pay | Admitting: Family Medicine

## 2018-01-11 ENCOUNTER — Ambulatory Visit (INDEPENDENT_AMBULATORY_CARE_PROVIDER_SITE_OTHER): Payer: Medicare Other | Admitting: Family Medicine

## 2018-01-11 VITALS — BP 148/82 | HR 88 | Temp 98.1°F | Resp 16 | Ht <= 58 in | Wt 140.0 lb

## 2018-01-11 DIAGNOSIS — Z0000 Encounter for general adult medical examination without abnormal findings: Secondary | ICD-10-CM

## 2018-01-11 DIAGNOSIS — Z1231 Encounter for screening mammogram for malignant neoplasm of breast: Secondary | ICD-10-CM

## 2018-01-11 DIAGNOSIS — Z23 Encounter for immunization: Secondary | ICD-10-CM | POA: Diagnosis not present

## 2018-01-11 DIAGNOSIS — J4520 Mild intermittent asthma, uncomplicated: Secondary | ICD-10-CM

## 2018-01-11 DIAGNOSIS — I739 Peripheral vascular disease, unspecified: Secondary | ICD-10-CM

## 2018-01-11 DIAGNOSIS — I70213 Atherosclerosis of native arteries of extremities with intermittent claudication, bilateral legs: Secondary | ICD-10-CM | POA: Diagnosis not present

## 2018-01-11 DIAGNOSIS — Z124 Encounter for screening for malignant neoplasm of cervix: Secondary | ICD-10-CM | POA: Diagnosis not present

## 2018-01-11 DIAGNOSIS — I10 Essential (primary) hypertension: Secondary | ICD-10-CM

## 2018-01-11 DIAGNOSIS — Z1239 Encounter for other screening for malignant neoplasm of breast: Secondary | ICD-10-CM

## 2018-01-11 NOTE — Progress Notes (Signed)
Subjective:    Patient ID: Felicia [name], female    DOB: May 12, 1966, 52 y.o.   MRN: [id number]  HPI Patient is here today for a complete physical exam.  She is overdue for mammogram.  She is due for a Pap smear.  Her last colonoscopy was in 2017 and was normal.  Therefore her colon cancer screening is up-to-date.  She has a history of peripheral vascular disease along with asthma and therefore she is due for Prevnar 13.  She has had Pneumovax 23 in the past.  She is also due for her flu shot.  We discussed the shingles vaccine and the tetanus shot but she declined them at the present time.  She is also due for hepatitis C screening as well as HIV screening the patient politely declined these as well due to lack of risk factors. Past Medical History:  Diagnosis Date  . Allergy    year around  . Anemia   . Anxiety   . Arthritis   . Asthma   . Atrial fibrillation (HCC)   . Bipolar 1 disorder (HCC)   . Depression   . Ectopic pregnancy   . Headache   . Heart murmur   . Hyperlipidemia   . Insomnia   . Peripheral arterial disease (HCC)   . Pneumonia   . PUD (peptic ulcer disease)   . Right knee pain 12-05-2015   per pt.  she fell out of her husband's truck and landed on her right knee  . Shortness of breath dyspnea    Past Surgical History:  Procedure Laterality Date  . ABDOMINAL AORTAGRAM N/A 09/12/2012   Procedure: ABDOMINAL AORTAGRAM;  Surgeon: Felicia Hertz, MD;  Location: [facility] CATH LAB;  Service: Cardiovascular;  Laterality: N/A;  . AORTA - BILATERAL FEMORAL ARTERY BYPASS GRAFT N/A 10/23/2014   Procedure: AORTOBIFEMORAL BYPASS GRAFT;  Surgeon: Felicia Hint, MD;  [facility] OR;  Service: Vascular;  Laterality: N/A;  . BACK SURGERY    . CESAREAN SECTION    . COLONOSCOPY  01-14-2004   > 11 yr ago Dr Felicia [name] - normal - report in epic   . DILATATION & CURETTAGE/HYSTEROSCOPY WITH MYOSURE N/A 10/15/2015   Procedure: DILATATION & CURETTAGE/HYSTEROSCOPY WITH MYOSURE;  Surgeon:  Felicia Edwards, MD;  Location: [facility] ORS;  Service: Gynecology;  Laterality: N/A;  . ESOPHAGOGASTRODUODENOSCOPY  01-14-2004   gastritis- dr Felicia [name]- in epic already   . NOSE SURGERY    . NOSE SURGERY    . SHOULDER SURGERY     rotator cuff repair   . TONSILLECTOMY    . TUBAL LIGATION    . UPPER GASTROINTESTINAL ENDOSCOPY  01-14-2004   in epic- dr Felicia [name]- gastritis    Current Outpatient Medications on File Prior to Visit  Medication Sig Dispense Refill  . albuterol (PROVENTIL HFA;VENTOLIN HFA) 108 (90 BASE) MCG/ACT inhaler Inhale 2 puffs into the lungs every 6 (six) hours as needed. For shortness of breath 18 g 3  . aspirin 325 MG tablet Take 325 mg by mouth daily.    . beclomethasone (QVAR) 80 MCG/ACT inhaler Inhale 2 puffs into the lungs 2 (two) times daily.    . clonazePAM (KLONOPIN) 1 MG tablet TAKE 1 TABLET BY MOUTH THREE TIMES A DAY 90 tablet 2  . clonazePAM (KLONOPIN) 2 MG tablet Take 0.5 tablets (1 mg total) by mouth 2 (two) times daily. 45 tablet 0  . cyclobenzaprine (FLEXERIL) 10 MG tablet TAKE 1 TABLET BY MOUTH 3 TIMES A  DAY AS NEEDED FOR MUSCLE SPASMS 90 tablet 3  . diphenhydrAMINE (BENADRYL) 50 MG capsule Take 50 mg by mouth every 6 (six) hours as needed for itching.    Marland Kitchen EPIPEN 2-PAK 0.3 MG/0.3ML SOAJ injection USE AS DIRECTED 2 Device 0  . ketorolac (TORADOL) 10 MG tablet Take 1 tablet (10 mg total) by mouth every 6 (six) hours as needed. 20 tablet 0  . losartan-hydrochlorothiazide (HYZAAR) 50-12.5 MG tablet Take 1 tablet by mouth daily. 90 tablet 3  . meloxicam (MOBIC) 15 MG tablet Take 1 tablet (15 mg total) by mouth daily. 90 tablet 0  . pantoprazole (PROTONIX) 40 MG tablet Take 1 tablet (40 mg total) by mouth daily. 90 tablet 1  . traMADol (ULTRAM) 50 MG tablet TAKE 2 TABLETS TWICE A DAY AS NEEDED FOR PAIN 120 tablet 1  . atorvastatin (LIPITOR) 80 MG tablet TAKE 1 TABLET (80 MG TOTAL) BY MOUTH DAILY. (Patient not taking: Reported on 12/27/2017) 90 tablet 3   No current  facility-administered medications on file prior to visit.    Allergies  Allergen Reactions  . Augmentin [Amoxicillin-Pot Clavulanate] Diarrhea  . Bactrim [Sulfamethoxazole-Trimethoprim] Hives  . Bee Pollen Swelling    Also ticks   . Cephalosporins Hives  . Codeine Nausea Only  . Doxycycline Hives  . Tetracyclines & Related Hives  . Reflex Pain Relief [Menthol] Rash    Muscle Rub   Social History   Socioeconomic History  . Marital status: Married    Spouse name: Not on file  . Number of children: Not on file  . Years of education: Not on file  . Highest education level: Not on file  Social Needs  . Financial resource strain: Not on file  . Food insecurity - worry: Not on file  . Food insecurity - inability: Not on file  . Transportation needs - medical: Not on file  . Transportation needs - non-medical: Not on file  Occupational History  . Not on file  Tobacco Use  . Smoking status: Current Every Day Smoker    Packs/day: 0.25    Years: 30.00    Pack years: 7.50    Types: Cigarettes    Last attempt to quit: 10/19/2014    Years since quitting: 3.2  . Smokeless tobacco: Never Used  . Tobacco comment: pt states that she is dealing with some stressful events in her life and as a result has began smoking about 3-4 cigs per day  Substance and Sexual Activity  . Alcohol use: Yes    Alcohol/week: 0.6 oz    Types: 1 Shots of liquor per week    Comment: very occassionally on social occassions  . Drug use: No  . Sexual activity: Yes  Other Topics Concern  . Not on file  Social History Narrative  . Not on file   Family History  Adopted: Yes  Problem Relation Age of Onset  . Heart disease Mother   . Diabetes Mother   . Hyperlipidemia Mother   . Hypertension Mother   . Heart attack Mother   . Heart disease Father   . Diabetes Father   . Heart attack Father   . Hypertension Father   . Bladder Cancer Father   . Diabetes Brother   . Lung cancer Paternal Grandfather     . Colon cancer Neg Hx   . Colon polyps Neg Hx   . Rectal cancer Neg Hx   . Stomach cancer Neg Hx  Review of Systems  All other systems reviewed and are negative.      Objective:   Physical Exam  Constitutional: She is oriented to person, place, and time. She appears well-developed and well-nourished. No distress.  HENT:  Head: Normocephalic and atraumatic.  Right Ear: External ear normal.  Left Ear: External ear normal.  Nose: Nose normal.  Mouth/Throat: Oropharynx is clear and moist. No oropharyngeal exudate.  Eyes: Conjunctivae and EOM are normal. Pupils are equal, round, and reactive to light. Right eye exhibits no discharge. Left eye exhibits no discharge. No scleral icterus.  Neck: Normal range of motion. Neck supple. No JVD present. No tracheal deviation present.  Cardiovascular: Normal rate, regular rhythm and intact distal pulses.  Murmur heard. Pulmonary/Chest: Effort normal and breath sounds normal. No stridor. No respiratory distress. She has no wheezes. She has no rales. She exhibits no tenderness.  Abdominal: Soft. Bowel sounds are normal. She exhibits no mass. There is no tenderness. There is no rebound and no guarding.  Genitourinary: Vagina normal and uterus normal. Uterus is not enlarged. Cervix exhibits no motion tenderness. Right adnexum displays no mass. Left adnexum displays no mass. No vaginal discharge found.  Musculoskeletal: Normal range of motion. She exhibits no edema, tenderness or deformity.  Lymphadenopathy:    She has no cervical adenopathy.  Neurological: She is alert and oriented to person, place, and time. She has normal reflexes. She displays normal reflexes. No cranial nerve deficit. She exhibits normal muscle tone. Coordination normal.  Skin: Skin is warm. No rash noted. She is not diaphoretic. No erythema. No pallor.  Psychiatric: She has a normal mood and affect. Her behavior is normal. Judgment and thought content normal.  Vitals  reviewed.         Assessment & Plan:  General medical exam  Cervical cancer screening - Plan: PAP, Thin Prep w/HPV rflx HPV Type 16/18  Essential hypertension  PVD (peripheral vascular disease) with claudication (HCC) - Plan: CBC with Differential/Platelet, COMPLETE METABOLIC PANEL WITH GFR, Lipid panel  Atherosclerosis of native artery of both lower extremities with intermittent claudication (HCC)  Mild intermittent chronic asthma without complication  Breast cancer screening - Plan: MM Digital Screening  Patient's physical exam today is normal aside from elevated blood pressure.  Patient will check her blood pressure every day for the next 2 weeks and notify me of the values so that we can adjust her medication if necessary.  She attributes the elevated blood pressure due to anxiety ever having a Pap smear.  Pap smear was performed and sent to pathology in a labeled container.  I will schedule the patient for a mammogram.  I will also check a CBC, CMP, fasting lipid panel.  Given her history, her goal LDL cholesterol is less than 70.  Patient declines HIV screening.  She declines hepatitis C screening.  She also declines the shingles vaccine and a tetanus shot today

## 2018-01-11 NOTE — Addendum Note (Signed)
Addended by: Legrand RamsWILLIS, Jesyka Slaght B on: 01/11/2018 02:54 PM   Modules accepted: Orders

## 2018-01-12 LAB — CBC WITH DIFFERENTIAL/PLATELET
Basophils Absolute: 25 cells/uL (ref 0–200)
Basophils Relative: 0.2 %
Eosinophils Absolute: 89 cells/uL (ref 15–500)
Eosinophils Relative: 0.7 %
HEMATOCRIT: 40.1 % (ref 35.0–45.0)
Hemoglobin: 13.6 g/dL (ref 11.7–15.5)
LYMPHS ABS: 2400 {cells}/uL (ref 850–3900)
MCH: 29.8 pg (ref 27.0–33.0)
MCHC: 33.9 g/dL (ref 32.0–36.0)
MCV: 87.7 fL (ref 80.0–100.0)
MPV: 9.8 fL (ref 7.5–12.5)
Monocytes Relative: 7.4 %
NEUTROS PCT: 72.8 %
Neutro Abs: 9246 cells/uL — ABNORMAL HIGH (ref 1500–7800)
PLATELETS: 326 10*3/uL (ref 140–400)
RBC: 4.57 10*6/uL (ref 3.80–5.10)
RDW: 13.3 % (ref 11.0–15.0)
TOTAL LYMPHOCYTE: 18.9 %
WBC mixed population: 940 cells/uL (ref 200–950)
WBC: 12.7 10*3/uL — AB (ref 3.8–10.8)

## 2018-01-12 LAB — COMPLETE METABOLIC PANEL WITH GFR
AG RATIO: 1.5 (calc) (ref 1.0–2.5)
ALBUMIN MSPROF: 3.8 g/dL (ref 3.6–5.1)
ALKALINE PHOSPHATASE (APISO): 65 U/L (ref 33–130)
ALT: 21 U/L (ref 6–29)
AST: 15 U/L (ref 10–35)
BILIRUBIN TOTAL: 0.4 mg/dL (ref 0.2–1.2)
BUN: 22 mg/dL (ref 7–25)
CO2: 28 mmol/L (ref 20–32)
CREATININE: 0.79 mg/dL (ref 0.50–1.05)
Calcium: 9 mg/dL (ref 8.6–10.4)
Chloride: 104 mmol/L (ref 98–110)
GFR, Est African American: 99 mL/min/{1.73_m2} (ref >=60)
GFR, Est Non African American: 85 mL/min/{1.73_m2} (ref >=60)
GLOBULIN: 2.5 g/dL (ref 1.9–3.7)
Glucose, Bld: 92 mg/dL (ref 65–99)
POTASSIUM: 4.4 mmol/L (ref 3.5–5.3)
SODIUM: 141 mmol/L (ref 135–146)
Total Protein: 6.3 g/dL (ref 6.1–8.1)

## 2018-01-12 LAB — LIPID PANEL
CHOL/HDL RATIO: 5.2 (calc) — AB (ref <5.0)
CHOLESTEROL: 261 mg/dL — AB (ref <200)
HDL: 50 mg/dL — ABNORMAL LOW (ref >50)
LDL Cholesterol (Calc): 190 mg/dL (calc) — ABNORMAL HIGH
NON-HDL CHOLESTEROL (CALC): 211 mg/dL — AB (ref <130)
Triglycerides: 96 mg/dL (ref <150)

## 2018-01-12 LAB — SPECIMEN COMPROMISED

## 2018-01-14 ENCOUNTER — Telehealth: Payer: Self-pay | Admitting: Family Medicine

## 2018-01-14 MED ORDER — LOSARTAN POTASSIUM-HCTZ 50-12.5 MG PO TABS: 1.0000 | ORAL_TABLET | Freq: Every day | ORAL | 3 refills | Status: DC

## 2018-01-14 MED ORDER — ATORVASTATIN CALCIUM 80 MG PO TABS: ORAL_TABLET | ORAL | 3 refills | Status: DC

## 2018-01-14 NOTE — Telephone Encounter (Signed)
Pt has been checking her BP and HR for the past several days and is concerned about her HR.   99/68 - 102,114/76 - 107,125/69 - 104, 118/74 - 100, 117/54 - 104, 137/75 - 90.

## 2018-01-14 NOTE — Telephone Encounter (Signed)
Those are good 

## 2018-01-17 LAB — PAP, TP IMAGING W/ HPV RNA, RFLX HPV TYPE 16,18/45: HPV DNA HIGH RISK: NOT DETECTED

## 2018-01-18 NOTE — Telephone Encounter (Signed)
Pt aware.

## 2018-02-01 ENCOUNTER — Other Ambulatory Visit: Payer: Self-pay | Admitting: Family Medicine

## 2018-02-01 NOTE — Telephone Encounter (Signed)
Ok to refill??  Last office visit 12/27/2017.  Last refill 01/06/2018.

## 2018-02-03 ENCOUNTER — Telehealth: Payer: Self-pay | Admitting: Family Medicine

## 2018-02-03 NOTE — Telephone Encounter (Signed)
Pt aware of recommendations and will use more of her flexeril and call on Monday for an apt if no better

## 2018-02-03 NOTE — Telephone Encounter (Signed)
Patient called [facility] stating she is having severe cramps and would like to know what to do about it or does she need an apt?

## 2018-02-03 NOTE — Telephone Encounter (Signed)
Could try her flexeril and if no better ntbs

## 2018-02-10 ENCOUNTER — Ambulatory Visit
Admission: RE | Admit: 2018-02-10 | Discharge: 2018-02-10 | Disposition: A | Discharge status: Home / Self Care | Payer: Medicare Other | Source: Ambulatory Visit | Attending: Family Medicine | Admitting: Family Medicine

## 2018-02-10 DIAGNOSIS — Z1239 Encounter for other screening for malignant neoplasm of breast: Secondary | ICD-10-CM

## 2018-02-24 ENCOUNTER — Other Ambulatory Visit: Payer: Self-pay | Admitting: Family Medicine

## 2018-02-24 NOTE — Telephone Encounter (Signed)
Requesting refill    Tramadol  LOV: 01/11/18  LRF:  12/30/17

## 2018-03-02 ENCOUNTER — Other Ambulatory Visit: Payer: Self-pay | Admitting: Family Medicine

## 2018-03-02 NOTE — Telephone Encounter (Signed)
Requesting refill    Klonopin  LOV: 01/11/18  LRF:  02/03/18

## 2018-03-19 ENCOUNTER — Other Ambulatory Visit: Payer: Self-pay | Admitting: Family Medicine

## 2018-04-15 ENCOUNTER — Telehealth: Payer: Self-pay | Admitting: Family Medicine

## 2018-04-15 ENCOUNTER — Other Ambulatory Visit: Payer: Self-pay | Admitting: Family Medicine

## 2018-04-15 MED ORDER — CLONAZEPAM 1 MG PO TABS: 1.0000 mg | ORAL_TABLET | Freq: Three times a day (TID) | ORAL | 2 refills | Status: DC

## 2018-04-15 NOTE — Telephone Encounter (Signed)
MD please advise.   Last refill was for Klonopin  0.5 tab PO BID on 03/03/2018.

## 2018-04-15 NOTE — Telephone Encounter (Signed)
Pt is having issues with her pharmacy and them refilling her klonopin she needs her prescription sent [facility] [address]. And she needs it written correctly for take 1 tablet by mouth 3x daily  tab.

## 2018-04-24 ENCOUNTER — Other Ambulatory Visit: Payer: Self-pay | Admitting: Family Medicine

## 2018-04-25 NOTE — Telephone Encounter (Signed)
Requesting refill    Tramadol  LOV: 01/11/18  LRF:  02/25/18

## 2018-04-26 ENCOUNTER — Other Ambulatory Visit: Payer: Self-pay | Admitting: Family Medicine

## 2018-04-26 MED ORDER — HYDROCHLOROTHIAZIDE 12.5 MG PO CAPS: 12.5000 mg | ORAL_CAPSULE | Freq: Every day | ORAL | 3 refills | Status: DC

## 2018-05-19 ENCOUNTER — Encounter: Payer: Self-pay | Admitting: Family Medicine

## 2018-05-19 ENCOUNTER — Ambulatory Visit: Payer: Medicare Other | Admitting: Family Medicine

## 2018-05-19 ENCOUNTER — Ambulatory Visit (HOSPITAL_COMMUNITY)
Admission: RE | Admit: 2018-05-19 | Discharge: 2018-05-19 | Disposition: A | Discharge status: Home / Self Care | Payer: Medicare Other | Source: Ambulatory Visit | Attending: Family Medicine | Admitting: Family Medicine

## 2018-05-19 VITALS — BP 180/80 | HR 100 | Temp 98.2°F | Resp 20 | Ht <= 58 in | Wt 150.0 lb

## 2018-05-19 DIAGNOSIS — R0602 Shortness of breath: Secondary | ICD-10-CM | POA: Diagnosis not present

## 2018-05-19 DIAGNOSIS — R140 Abdominal distension (gaseous): Secondary | ICD-10-CM | POA: Insufficient documentation

## 2018-05-19 DIAGNOSIS — K469 Unspecified abdominal hernia without obstruction or gangrene: Secondary | ICD-10-CM | POA: Diagnosis not present

## 2018-05-19 DIAGNOSIS — R1084 Generalized abdominal pain: Secondary | ICD-10-CM | POA: Insufficient documentation

## 2018-05-19 DIAGNOSIS — Z95828 Presence of other vascular implants and grafts: Secondary | ICD-10-CM | POA: Insufficient documentation

## 2018-05-19 DIAGNOSIS — K432 Incisional hernia without obstruction or gangrene: Secondary | ICD-10-CM | POA: Diagnosis not present

## 2018-05-19 LAB — CBC WITH DIFFERENTIAL/PLATELET
BASOS PCT: 0.6 %
Basophils Absolute: 62 cells/uL (ref 0–200)
EOS ABS: 134 {cells}/uL (ref 15–500)
Eosinophils Relative: 1.3 %
HCT: 37.2 % (ref 35.0–45.0)
Hemoglobin: 12.7 g/dL (ref 11.7–15.5)
Lymphs Abs: 4501 cells/uL — ABNORMAL HIGH (ref 850–3900)
MCH: 31.1 pg (ref 27.0–33.0)
MCHC: 34.1 g/dL (ref 32.0–36.0)
MCV: 91 fL (ref 80.0–100.0)
MPV: 9.9 fL (ref 7.5–12.5)
Monocytes Relative: 6.5 %
NEUTROS PCT: 47.9 %
Neutro Abs: 4934 cells/uL (ref 1500–7800)
PLATELETS: 295 10*3/uL (ref 140–400)
RBC: 4.09 10*6/uL (ref 3.80–5.10)
RDW: 12.1 % (ref 11.0–15.0)
TOTAL LYMPHOCYTE: 43.7 %
WBC mixed population: 670 cells/uL (ref 200–950)
WBC: 10.3 10*3/uL (ref 3.8–10.8)

## 2018-05-19 LAB — URINALYSIS, ROUTINE W REFLEX MICROSCOPIC
Bacteria, UA: NONE SEEN /HPF
Bilirubin Urine: NEGATIVE
Glucose, UA: NEGATIVE
Ketones, ur: NEGATIVE
Nitrite: NEGATIVE
PH: 6 (ref 5.0–8.0)
SPECIFIC GRAVITY, URINE: 1.015 (ref 1.001–1.03)

## 2018-05-19 LAB — COMPLETE METABOLIC PANEL WITH GFR
AG Ratio: 1.6 (calc) (ref 1.0–2.5)
ALBUMIN MSPROF: 3.8 g/dL (ref 3.6–5.1)
ALT: 19 U/L (ref 6–29)
AST: 12 U/L (ref 10–35)
Alkaline phosphatase (APISO): 66 U/L (ref 33–130)
BUN: 12 mg/dL (ref 7–25)
CO2: 29 mmol/L (ref 20–32)
CREATININE: 0.93 mg/dL (ref 0.50–1.05)
Calcium: 8.9 mg/dL (ref 8.6–10.4)
Chloride: 106 mmol/L (ref 98–110)
GFR, EST AFRICAN AMERICAN: 81 mL/min/{1.73_m2} (ref >=60)
GFR, EST NON AFRICAN AMERICAN: 70 mL/min/{1.73_m2} (ref >=60)
GLOBULIN: 2.4 g/dL (ref 1.9–3.7)
Glucose, Bld: 113 mg/dL — ABNORMAL HIGH (ref 65–99)
Potassium: 3.8 mmol/L (ref 3.5–5.3)
SODIUM: 144 mmol/L (ref 135–146)
TOTAL PROTEIN: 6.2 g/dL (ref 6.1–8.1)
Total Bilirubin: 0.3 mg/dL (ref 0.2–1.2)

## 2018-05-19 LAB — MICROSCOPIC MESSAGE

## 2018-05-19 LAB — LIPASE: Lipase: 20 U/L (ref 7–60)

## 2018-05-19 MED ORDER — PANTOPRAZOLE SODIUM 20 MG PO TBEC: 20.0000 mg | DELAYED_RELEASE_TABLET | Freq: Every day | ORAL | 0 refills | Status: DC

## 2018-05-19 MED ORDER — DICYCLOMINE HCL 20 MG PO TABS: 20.0000 mg | ORAL_TABLET | Freq: Three times a day (TID) | ORAL | 0 refills | Status: DC | PRN

## 2018-05-19 MED ORDER — ALBUTEROL SULFATE HFA 108 (90 BASE) MCG/ACT IN AERS: 2.0000 | INHALATION_SPRAY | Freq: Four times a day (QID) | RESPIRATORY_TRACT | 3 refills | Status: DC | PRN

## 2018-05-19 MED ORDER — HYDROCODONE-ACETAMINOPHEN 5-325 MG PO TABS: 1.0000 | ORAL_TABLET | Freq: Three times a day (TID) | ORAL | 0 refills | Status: AC | PRN

## 2018-05-19 MED ORDER — IOPAMIDOL (ISOVUE-300) INJECTION 61%
100.0000 mL | Freq: Once | INTRAVENOUS | Status: AC | PRN
  Administered 2018-05-19: 100 mL via INTRAVENOUS

## 2018-05-19 MED ORDER — ONDANSETRON HCL 4 MG PO TABS: 4.0000 mg | ORAL_TABLET | Freq: Three times a day (TID) | ORAL | 0 refills | Status: DC | PRN

## 2018-05-19 NOTE — Progress Notes (Signed)
Labs look very good and reassuring.   There is mild elevation in a certain type of white blood cell that is usually from a viral infection, so she may have a viral gastroenteritis.   She does have blood in her urine, which is her baseline.  Has this been worked up before?   Please LMK thanks

## 2018-05-19 NOTE — Progress Notes (Signed)
Please notify pt of negative CT scan, does not show anything that would be causing sx.  Will call in a medicine to help treat any possible GERD/gastritis/peptic ulcers.   Please have pt f/up if sx are not better in 1-2 weeks.  We may need to do stool studies, other images/tests, or refer to GI.

## 2018-05-19 NOTE — Progress Notes (Signed)
Patient ID: Felicia [name], female    DOB: 04/23/1966, 52 y.o.   MRN: [id number]  PCP: Donita BrooksPickard, Warren T, MD  Chief Complaint  Patient presents with  . ABD swelling after eating    Subjective:   Felicia [name] is a 52 y.o. female, presents to clinic with CC of severe abdominal pain with bloating that acutely worsened yesterday and last night, but has been gradually worsening since its onset at least 1 month ago.  Patient states that one week ago she became acutely ill and had 2 episodes of severe vomiting which is clear emesis, followed by abdominal pain and profuse diarrhea.  Her bloating at that time did begin to worsen.  She states that she has not had any bowel movements since then.  That day she did have associated hot and cold chills and sweats.  Did not have any sick contacts or suspicious food intake.  Then her stomach has much more severe pain and bloating whenever she eats but she has been able to eat solids and liquids.  She states that she has had no bowel movements at all for 1 week and has decreased flatus.  Last night she states that she "thought she was going to die" pain was severe with bloating, most intense pain is located in her epigastrium, pain is rated 12 out of 10, her bloating was so bad it was putting "pressure on her lungs" she also thought her "heart was going to explode."  She had no nausea or vomiting associated, no fever sweats or chills.  No radiation of her abdominal pain.  Her husband wanted her to go to the ER but she refused and so she came here today for evaluation.  Her pain has decreased, now rated 7 out of 10, bloating persists.  She states that she has noticed a mass or bulging in her epigastric area and one around her bellybutton that becomes much more noticeable when she is coughing.  She does have a midline incision from an aorta surgery She denies any other abdominal surgeries, denies hematemesis, hematochezia, melena.  Emesis was nonbloody and  nonbilious.  She denies any past medical history with liver, pancreas, alcohol abuse.  She reports past medical history of peptic ulcer with an upper GI bleed, abdominal aorta surgical repair or bypass with some stents or bypass to bilateral iliac arteries.  She states that with her abdominal pain she has had no pallor, rubor, numbness, tingling weakness or change of symptoms in her lower extremities.  Last angiogram was 2015 Colonoscopy done in 2017 was normal    Patient Active Problem List   Diagnosis Date Noted  . Low back strain 05/08/2015  . Asthma, chronic 05/08/2015  . Atherosclerosis of native artery of both lower extremities with intermittent claudication (HCC) 10/23/2014  . PVD (peripheral vascular disease) with claudication (HCC) 09/26/2014  . Atherosclerosis of native arteries of extremity with intermittent claudication (HCC) 09/14/2012  . PUD (peptic ulcer disease)   . Hyperlipidemia      Prior to Admission medications   Medication Sig Start Date End Date Taking? Authorizing Provider  albuterol (PROVENTIL HFA;VENTOLIN HFA) 108 (90 BASE) MCG/ACT inhaler Inhale 2 puffs into the lungs every 6 (six) hours as needed. For shortness of breath 01/22/15  Yes Donita BrooksPickard, Warren T, MD  aspirin 325 MG tablet Take 325 mg by mouth daily.   Yes [provider]  atorvastatin (LIPITOR) 80 MG tablet TAKE 1 TABLET (80 MG TOTAL) BY MOUTH DAILY.  01/14/18  Yes Donita Brooks, MD  beclomethasone (QVAR) 80 MCG/ACT inhaler Inhale 2 puffs into the lungs 2 (two) times daily.   Yes [provider]  clonazePAM (KLONOPIN) 1 MG tablet Take 1 tablet (1 mg total) by mouth 3 (three) times daily. 04/15/18  Yes Donita Brooks, MD  cyclobenzaprine (FLEXERIL) 10 MG tablet TAKE 1 TABLET BY MOUTH 3 TIMES A DAY AS NEEDED FOR MUSCLE SPASMS 03/18/17  Yes Donita Brooks, MD  diphenhydrAMINE (BENADRYL) 50 MG capsule Take 50 mg by mouth every 6 (six) hours as needed for itching.   Yes [provider]  EPIPEN 2-PAK 0.3 MG/0.3ML SOAJ injection USE AS DIRECTED 04/29/16  Yes Donita Brooks, MD  losartan (COZAAR) 50 MG tablet Please specify directions, refills and quantity 04/26/18  Yes Pickard, Priscille Heidelberg, MD  meloxicam (MOBIC) 15 MG tablet Take 1 tablet (15 mg total) by mouth daily. 12/29/17  Yes Donita Brooks, MD  traMADol (ULTRAM) 50 MG tablet TAKE 2 TABLETS BY MOUTH TWICE A DAY AS NEEDED FOR PAIN 04/25/18  Yes [place], Velna Hatchet, MD  hydrochlorothiazide (MICROZIDE) 12.5 MG capsule Take 1 capsule (12.5 mg total) by mouth daily. Patient not taking: Reported on 05/19/2018 04/26/18   Donita Brooks, MD  pantoprazole (PROTONIX) 40 MG tablet Take 1 tablet (40 mg total) by mouth daily. Patient not taking: Reported on 05/19/2018 12/18/16   Donita Brooks, MD     Allergies  Allergen Reactions  . Augmentin [Amoxicillin-Pot Clavulanate] Diarrhea  . Bactrim [Sulfamethoxazole-Trimethoprim] Hives  . Bee Pollen Swelling    Also ticks   . Cephalosporins Hives  . Codeine Nausea Only  . Doxycycline Hives  . Tetracyclines & Related Hives  . Reflex Pain Relief [Menthol] Rash    Muscle Rub     Family History  Adopted: Yes  Problem Relation Age of Onset  . Heart disease Mother   . Diabetes Mother   . Hyperlipidemia Mother   . Hypertension Mother   . Heart attack Mother   . Heart disease Father   . Diabetes Father   . Heart attack Father   . Hypertension Father   . Bladder Cancer Father   . Diabetes Brother   . Lung cancer Paternal Grandfather   . Colon cancer Neg Hx   . Colon polyps Neg Hx   . Rectal cancer Neg Hx   . Stomach cancer Neg Hx      Social History   Socioeconomic History  . Marital status: Married    Spouse name: Not on file  . Number of children: Not on file  . Years of education: Not on file  . Highest education level: Not on file  Occupational History  . Not on file  Social Needs  . Financial resource strain: Not on file  . Food insecurity:     Worry: Not on file    Inability: Not on file  . Transportation needs:    Medical: Not on file    Non-medical: Not on file  Tobacco Use  . Smoking status: Current Every Day Smoker    Packs/day: 0.25    Years: 30.00    Pack years: 7.50    Types: Cigarettes    Last attempt to quit: 10/19/2014    Years since quitting: 3.5  . Smokeless tobacco: Never Used  . Tobacco comment: pt states that she is dealing with some stressful events in her life and as a result has began smoking about 3-4 cigs  per day  Substance and Sexual Activity  . Alcohol use: Yes    Alcohol/week: 0.6 oz    Types: 1 Shots of liquor per week    Comment: very occassionally on social occassions  . Drug use: No  . Sexual activity: Yes  Lifestyle  . Physical activity:    Days per week: Not on file    Minutes per session: Not on file  . Stress: Not on file  Relationships  . Social connections:    Talks on phone: Not on file    Gets together: Not on file    Attends religious service: Not on file    Active member of club or organization: Not on file    Attends meetings of clubs or organizations: Not on file    Relationship status: Not on file  . Intimate partner violence:    Fear of current or ex partner: Not on file    Emotionally abused: Not on file    Physically abused: Not on file    Forced sexual activity: Not on file  Other Topics Concern  . Not on file  Social History Narrative  . Not on file     Review of Systems  Constitutional: Negative.  Negative for activity change and appetite change.  HENT: Negative.   Eyes: Negative.   Respiratory: Positive for wheezing.   Cardiovascular: Negative.   Gastrointestinal: Positive for abdominal distention, abdominal pain, constipation, diarrhea and nausea. Negative for vomiting.  Endocrine: Negative.   Genitourinary: Negative.   Musculoskeletal: Negative.   Skin: Negative.   Allergic/Immunologic: Negative.   Neurological: Negative.   Hematological: Negative.    Psychiatric/Behavioral: Negative.   All other systems reviewed and are negative.      Objective:    Vitals:   05/19/18 1408  BP: (!) 180/80  Pulse: 100  Resp: 20  Temp: 98.2 F (36.8 C)  TempSrc: Oral  SpO2: 98%  Weight: 150 lb (68 kg)  Height: 4\' 9"  (1.448 m)      Physical Exam  Constitutional: She is oriented to person, place, and time. She appears well-developed and well-nourished.  Non-toxic appearance. No distress.  Obese female, appears uncomfortable and mildly short of breath, nontoxic-appearing, no distress  HENT:  Head: Normocephalic and atraumatic.  Right Ear: External ear normal.  Left Ear: External ear normal.  Nose: Nose normal.  Mouth/Throat: Uvula is midline, oropharynx is clear and moist and mucous membranes are normal.  Eyes: Pupils are equal, round, and reactive to light. Conjunctivae, EOM and lids are normal. No scleral icterus.  Neck: Normal range of motion and phonation normal. Neck supple. No tracheal deviation present.  Cardiovascular: Normal rate, regular rhythm, normal heart sounds, intact distal pulses and normal pulses. Exam reveals no gallop and no friction rub.  No murmur heard. Pulses:      Radial pulses are 2+ on the right side, and 2+ on the left side.       Posterior tibial pulses are 2+ on the right side, and 2+ on the left side.  Pulmonary/Chest: Effort normal. No respiratory distress. She has no wheezes. She has no rhonchi. She has no rales. She exhibits no tenderness.  Mild tachypnea, unable to take a deep breath, no retractions, faint expiratory wheeze, no rales or rhonchi  Abdominal: Soft. Normal appearance and bowel sounds are normal. She exhibits distension. She exhibits no mass. There is tenderness. There is no rebound and no guarding. A hernia is present.  Distended tympanic obese abdomen with hypoactive  bowel sounds x4, midline surgical scar with incisional hernia roughly 4 cm long palpated only with coughing, at the most  superior aspect of incision over epigastric area, possible hernia also palpated just superior to umbilicus, roughly same size, neither feel indurated or firm but the epigastric area is tender to palpation with voluntary guarding.  Distention of remaining abdomen is still somewhat soft and tympanic without any rigidity, guarding or rebound tenderness.  No CVA tenderness bilaterally, exam somewhat limited by body habitus  Musculoskeletal: Normal range of motion. She exhibits no edema or deformity.  Lymphadenopathy:    She has no cervical adenopathy.  Neurological: She is alert and oriented to person, place, and time. No sensory deficit. She exhibits normal muscle tone. Coordination and gait normal.  Skin: Skin is warm, dry and intact. Capillary refill takes less than 2 seconds. No rash noted. She is not diaphoretic. No pallor.  Psychiatric: Her speech is normal and behavior is normal. Her mood appears anxious.  Nursing note and vitals reviewed.         Assessment & Plan:      ICD-10-CM   1. Generalized abdominal pain R10.84 Urinalysis, Routine w reflex microscopic    POCT occult blood stool    CT Abdomen Pelvis W Contrast    COMPLETE METABOLIC PANEL WITH GFR    CBC with Differential    Lipase    ondansetron (ZOFRAN) 4 MG tablet    dicyclomine (BENTYL) 20 MG tablet    HYDROcodone-acetaminophen (NORCO) 5-325 MG tablet  2. Abdominal distension (gaseous) R14.0 CT Abdomen Pelvis W Contrast    COMPLETE METABOLIC PANEL WITH GFR    CBC with Differential    Lipase    ondansetron (ZOFRAN) 4 MG tablet    dicyclomine (BENTYL) 20 MG tablet  3. Ventral incisional hernia K43.2 COMPLETE METABOLIC PANEL WITH GFR    CBC with Differential    Lipase  4. Shortness of breath R06.02      Diarrhea chronic x 1 month then took meds, incontinence of stool  No regular stools in more than 2 months   Just prior to leaving the clinic patient completely change the history of her present illness and then  stated that she has been leaking watery stool and has a history of at least 3 months of chronic diarrhea.  She states that she took medication from her uncle (Diphenoxylate - atrop took 1x) which stopped it effectively a few weeks ago.  She denies taking any other medicines like Imodium.    Given severity and complexity of patient's history and her abdomen with midline incision, to possible ventral incisional hernias and a history of some kind of abdominal aortic repair ? and aortoiliac occlusive disease with aorto bifemoral past graft, embolism and thrombosis of iliac arteries in the past, severe arterial occlusive disease with smoking, hypertension, hyperlipidemia, also unclear history of not passing stools or possibly diarrhea leaking stools with no abdominal bloating with acute and severe pain last night, wanted to obtain expedited imaging and lab work.  In the differential is possibly colitis, obstruction, incarcerated hernia, ischemia.    Patient was supposed to wait in the room until I could do a digital rectal exam and check for occult blood in her stool, also check for any fecal impaction however she did leave clinic to go directly to the hospital for the CT scan before it could complete this.  So also the differential is possible fecal impaction.   Danelle Berry, PA-C 05/19/18 2:16 PM

## 2018-05-25 ENCOUNTER — Encounter (HOSPITAL_COMMUNITY): Payer: Self-pay | Admitting: Emergency Medicine

## 2018-05-25 ENCOUNTER — Telehealth: Payer: Self-pay | Admitting: Family Medicine

## 2018-05-25 ENCOUNTER — Emergency Department (HOSPITAL_COMMUNITY)
Admission: EM | Admit: 2018-05-25 | Discharge: 2018-05-25 | Disposition: A | Discharge status: Home / Self Care | Payer: Medicare Other | Attending: Emergency Medicine | Admitting: Emergency Medicine

## 2018-05-25 ENCOUNTER — Emergency Department (HOSPITAL_COMMUNITY): Payer: Medicare Other

## 2018-05-25 ENCOUNTER — Other Ambulatory Visit: Payer: Self-pay

## 2018-05-25 DIAGNOSIS — I70213 Atherosclerosis of native arteries of extremities with intermittent claudication, bilateral legs: Secondary | ICD-10-CM | POA: Diagnosis not present

## 2018-05-25 DIAGNOSIS — F17210 Nicotine dependence, cigarettes, uncomplicated: Secondary | ICD-10-CM | POA: Diagnosis not present

## 2018-05-25 DIAGNOSIS — R1084 Generalized abdominal pain: Secondary | ICD-10-CM

## 2018-05-25 DIAGNOSIS — J45909 Unspecified asthma, uncomplicated: Secondary | ICD-10-CM | POA: Insufficient documentation

## 2018-05-25 DIAGNOSIS — Z7982 Long term (current) use of aspirin: Secondary | ICD-10-CM | POA: Insufficient documentation

## 2018-05-25 DIAGNOSIS — Z79899 Other long term (current) drug therapy: Secondary | ICD-10-CM | POA: Diagnosis not present

## 2018-05-25 LAB — CBC WITH DIFFERENTIAL/PLATELET
BASOS PCT: 0 %
Basophils Absolute: 0 10*3/uL (ref 0.0–0.1)
EOS ABS: 0.1 10*3/uL (ref 0.0–0.7)
Eosinophils Relative: 1 %
HCT: 40.8 % (ref 36.0–46.0)
HEMOGLOBIN: 13.5 g/dL (ref 12.0–15.0)
Lymphocytes Relative: 21 %
Lymphs Abs: 1.8 10*3/uL (ref 0.7–4.0)
MCH: 31.1 pg (ref 26.0–34.0)
MCHC: 33.1 g/dL (ref 30.0–36.0)
MCV: 94 fL (ref 78.0–100.0)
Monocytes Absolute: 0.4 10*3/uL (ref 0.1–1.0)
Monocytes Relative: 5 %
NEUTROS PCT: 73 %
Neutro Abs: 6.6 10*3/uL (ref 1.7–7.7)
Platelets: 297 10*3/uL (ref 150–400)
RBC: 4.34 MIL/uL (ref 3.87–5.11)
RDW: 12.3 % (ref 11.5–15.5)
WBC: 8.9 10*3/uL (ref 4.0–10.5)

## 2018-05-25 LAB — COMPREHENSIVE METABOLIC PANEL
ALBUMIN: 3.6 g/dL (ref 3.5–5.0)
ALT: 25 U/L (ref 0–44)
AST: 24 U/L (ref 15–41)
Alkaline Phosphatase: 69 U/L (ref 38–126)
Anion gap: 9 (ref 5–15)
BUN: 15 mg/dL (ref 6–20)
CHLORIDE: 104 mmol/L (ref 98–111)
CO2: 28 mmol/L (ref 22–32)
CREATININE: 0.71 mg/dL (ref 0.44–1.00)
Calcium: 8.8 mg/dL — ABNORMAL LOW (ref 8.9–10.3)
GFR calc Af Amer: >60 mL/min (ref >60)
GFR calc non Af Amer: >60 mL/min (ref >60)
GLUCOSE: 118 mg/dL — AB (ref 70–99)
Potassium: 3.7 mmol/L (ref 3.5–5.1)
SODIUM: 141 mmol/L (ref 135–145)
Total Bilirubin: 0.6 mg/dL (ref 0.3–1.2)
Total Protein: 7 g/dL (ref 6.5–8.1)

## 2018-05-25 LAB — URINALYSIS, ROUTINE W REFLEX MICROSCOPIC
BACTERIA UA: NONE SEEN
Bilirubin Urine: NEGATIVE
Glucose, UA: NEGATIVE mg/dL
KETONES UR: NEGATIVE mg/dL
LEUKOCYTES UA: NEGATIVE
NITRITE: NEGATIVE
Protein, ur: NEGATIVE mg/dL
pH: 8 (ref 5.0–8.0)

## 2018-05-25 LAB — LIPASE, BLOOD: LIPASE: 20 U/L (ref 11–51)

## 2018-05-25 MED ORDER — FAMOTIDINE IN NACL 20-0.9 MG/50ML-% IV SOLN
20.0000 mg | Freq: Once | INTRAVENOUS | Status: AC
  Administered 2018-05-25: 20 mg via INTRAVENOUS
  Filled 2018-05-25: qty 50

## 2018-05-25 MED ORDER — IOPAMIDOL (ISOVUE-300) INJECTION 61%
100.0000 mL | Freq: Once | INTRAVENOUS | Status: AC | PRN
  Administered 2018-05-25: 100 mL via INTRAVENOUS

## 2018-05-25 MED ORDER — DICYCLOMINE HCL 10 MG/ML IM SOLN
20.0000 mg | Freq: Once | INTRAMUSCULAR | Status: AC
  Administered 2018-05-25: 20 mg via INTRAMUSCULAR
  Filled 2018-05-25: qty 2

## 2018-05-25 MED ORDER — PROMETHAZINE HCL 25 MG/ML IJ SOLN
12.5000 mg | Freq: Once | INTRAMUSCULAR | Status: AC
  Administered 2018-05-25: 12.5 mg via INTRAVENOUS
  Filled 2018-05-25: qty 1

## 2018-05-25 NOTE — ED Notes (Signed)
Patient transported to CT 

## 2018-05-25 NOTE — Discharge Instructions (Addendum)
Take your usual prescriptions as previously directed. Eat a bland diet for the next several days. Call your regular medical doctor and the GI doctor tomorrow to schedule a follow up appointment within the next week.  Return to the Emergency Department immediately sooner if worsening.

## 2018-05-25 NOTE — Telephone Encounter (Signed)
Called patient to give her the results to her lab work and patient informed me that she was having excruciating abdominal pain, with fever, cold chills, loose stools, and fatigue. While talking with patient she sounded like she was out of breath and she kept pausing telling me that pain was severe. She also informed me that she has not been able to eat or drink in the past 24 hours. Advised patient to go to the ER immediately. Patient verbalized understanding.

## 2018-05-25 NOTE — ED Provider Notes (Signed)
[facility] EMERGENCY DEPARTMENT Provider Note   CSN: [id number] Arrival date & time: 05/25/18  1121     History   Chief Complaint Chief Complaint  Patient presents with  . Abdominal Pain    HPI Felicia [name] is a 52 y.o. female.  HPI Pt was seen at 1215.  Per pt, c/o gradual onset and persistence of constant generalized abd "pain" for the past 1 month.  Has been associated with several intermittent episodes of N/V and alternating constipation and diarrhea.  Describes the abd pain as "bloating."  Describes the stools as "watery." States one time she saw blood on the toilet paper after she wiped. Pt was evaluated by her PMD last week for this complaint "and given antibiotics for a bacterial infection."  Denies fevers, no back pain, no rash, no CP/SOB, no black or blood in stools or emesis.      Past Medical History:  Diagnosis Date  . Allergy    year around  . Anemia   . Anxiety   . Arthritis   . Asthma   . Atrial fibrillation (HCC)   . Bipolar 1 disorder (HCC)   . Depression   . Ectopic pregnancy   . Headache   . Heart murmur   . Hyperlipidemia   . Insomnia   . Peripheral arterial disease (HCC)   . Pneumonia   . PUD (peptic ulcer disease)   . Right knee pain 12-05-2015   per pt.  she fell out of her husband's truck and landed on her right knee  . Shortness of breath dyspnea     Patient Active Problem List   Diagnosis Date Noted  . Low back strain 05/08/2015  . Asthma, chronic 05/08/2015  . Atherosclerosis of native artery of both lower extremities with intermittent claudication (HCC) 10/23/2014  . PVD (peripheral vascular disease) with claudication (HCC) 09/26/2014  . Atherosclerosis of native arteries of extremity with intermittent claudication (HCC) 09/14/2012  . PUD (peptic ulcer disease)   . Hyperlipidemia     Past Surgical History:  Procedure Laterality Date  . ABDOMINAL AORTAGRAM N/A 09/12/2012   Procedure: ABDOMINAL AORTAGRAM;  Surgeon: Fransisco Hertz, MD;  Location: [facility] CATH LAB;  Service: Cardiovascular;  Laterality: N/A;  . AORTA - BILATERAL FEMORAL ARTERY BYPASS GRAFT N/A 10/23/2014   Procedure: AORTOBIFEMORAL BYPASS GRAFT;  Surgeon: Chuck Hint, MD;  Location: [facility] OR;  Service: Vascular;  Laterality: N/A;  . BACK SURGERY    . CESAREAN SECTION    . COLONOSCOPY  01-14-2004   > 11 yr ago Dr Loreta Ave - normal - report in epic   . DILATATION & CURETTAGE/HYSTEROSCOPY WITH MYOSURE N/A 10/15/2015   Procedure: DILATATION & CURETTAGE/HYSTEROSCOPY WITH MYOSURE;  Surgeon: Ok Edwards, MD;  Location: [facility] ORS;  Service: Gynecology;  Laterality: N/A;  . ESOPHAGOGASTRODUODENOSCOPY  01-14-2004   gastritis- dr Loreta Ave- in epic already   . NOSE SURGERY    . NOSE SURGERY    . SHOULDER SURGERY     rotator cuff repair   . TONSILLECTOMY    . TUBAL LIGATION    . UPPER GASTROINTESTINAL ENDOSCOPY  01-14-2004   in epic- dr Loreta Ave- gastritis      OB History    Gravida  3   Para  2   Term      Preterm      AB  1   Living  2     SAB      TAB  Ectopic  1   Multiple      Live Births               Home Medications    Prior to Admission medications   Medication Sig Start Date End Date Taking? Authorizing Provider  albuterol (PROVENTIL HFA;VENTOLIN HFA) 108 (90 Base) MCG/ACT inhaler Inhale 2 puffs into the lungs every 6 (six) hours as needed. For shortness of breath 05/19/18   Danelle Berryapia, Leisa, PA-C  aspirin 325 MG tablet Take 325 mg by mouth daily.    [provider]  atorvastatin (LIPITOR) 80 MG tablet TAKE 1 TABLET (80 MG TOTAL) BY MOUTH DAILY. 01/14/18   Donita BrooksPickard, Warren T, MD  beclomethasone (QVAR) 80 MCG/ACT inhaler Inhale 2 puffs into the lungs 2 (two) times daily.    [provider]  clonazePAM (KLONOPIN) 1 MG tablet Take 1 tablet (1 mg total) by mouth 3 (three) times daily. 04/15/18   Donita BrooksPickard, Warren T, MD  cyclobenzaprine (FLEXERIL) 10 MG tablet TAKE 1 TABLET BY MOUTH 3 TIMES A DAY AS NEEDED FOR MUSCLE  SPASMS 03/18/17   Donita BrooksPickard, Warren T, MD  dicyclomine (BENTYL) 20 MG tablet Take 1 tablet (20 mg total) by mouth 3 (three) times daily with meals as needed for spasms. 05/19/18   Danelle Berryapia, Leisa, PA-C  diphenhydrAMINE (BENADRYL) 50 MG capsule Take 50 mg by mouth every 6 (six) hours as needed for itching.    [provider]  EPIPEN 2-PAK 0.3 MG/0.3ML SOAJ injection USE AS DIRECTED 04/29/16   Donita BrooksPickard, Warren T, MD  hydrochlorothiazide (MICROZIDE) 12.5 MG capsule Take 1 capsule (12.5 mg total) by mouth daily. Patient not taking: Reported on 05/19/2018 04/26/18   Donita BrooksPickard, Warren T, MD  losartan (COZAAR) 50 MG tablet Please specify directions, refills and quantity 04/26/18   Donita BrooksPickard, Warren T, MD  meloxicam (MOBIC) 15 MG tablet Take 1 tablet (15 mg total) by mouth daily. 12/29/17   Donita BrooksPickard, Warren T, MD  ondansetron (ZOFRAN) 4 MG tablet Take 1 tablet (4 mg total) by mouth every 8 (eight) hours as needed for nausea or vomiting. 05/19/18   Danelle Berryapia, Leisa, PA-C  pantoprazole (PROTONIX) 20 MG tablet Take 1 tablet (20 mg total) by mouth daily. 05/19/18   Danelle Berryapia, Leisa, PA-C  pantoprazole (PROTONIX) 40 MG tablet Take 1 tablet (40 mg total) by mouth daily. Patient not taking: Reported on 05/19/2018 12/18/16   Donita BrooksPickard, Warren T, MD  traMADol Janean Sark(ULTRAM) 50 MG tablet TAKE 2 TABLETS BY MOUTH TWICE A DAY AS NEEDED FOR PAIN 04/25/18   Salley Scarleturham, Kawanta F, MD    Family History Family History  Adopted: Yes  Problem Relation Age of Onset  . Heart disease Mother   . Diabetes Mother   . Hyperlipidemia Mother   . Hypertension Mother   . Heart attack Mother   . Heart disease Father   . Diabetes Father   . Heart attack Father   . Hypertension Father   . Bladder Cancer Father   . Diabetes Brother   . Lung cancer Paternal Grandfather   . Colon cancer Neg Hx   . Colon polyps Neg Hx   . Rectal cancer Neg Hx   . Stomach cancer Neg Hx     Social History Social History   Tobacco Use  . Smoking status: Current Every Day Smoker      Packs/day: 0.25    Years: 30.00    Pack years: 7.50    Types: Cigarettes    Last attempt to quit: 10/19/2014  Years since quitting: 3.6  . Smokeless tobacco: Never Used  . Tobacco comment: pt states that she is dealing with some stressful events in her life and as a result has began smoking about 3-4 cigs per day  Substance Use Topics  . Alcohol use: Yes    Alcohol/week: 0.6 oz    Types: 1 Shots of liquor per week    Comment: very occassionally on social occassions  . Drug use: No     Allergies   Augmentin [amoxicillin-pot clavulanate]; Bactrim [sulfamethoxazole-trimethoprim]; Bee pollen; Cephalosporins; Codeine; Doxycycline; Tetracyclines & related; and Reflex pain relief [menthol]   Review of Systems Review of Systems ROS: Statement: All systems negative except as marked or noted in the HPI; Constitutional: Negative for fever and chills. ; ; Eyes: Negative for eye pain, redness and discharge. ; ; ENMT: Negative for ear pain, hoarseness, nasal congestion, sinus pressure and sore throat. ; ; Cardiovascular: Negative for chest pain, palpitations, diaphoresis, dyspnea and peripheral edema. ; ; Respiratory: Negative for cough, wheezing and stridor. ; ; Gastrointestinal: +abd pain, N/V, diarrhea, constipation. Negative for blood in stool, hematemesis, jaundice and rectal bleeding. . ; ; Genitourinary: Negative for dysuria, flank pain and hematuria. ; ; Musculoskeletal: Negative for back pain and neck pain. Negative for swelling and trauma.; ; Skin: Negative for pruritus, rash, abrasions, blisters, bruising and skin lesion.; ; Neuro: Negative for headache, lightheadedness and neck stiffness. Negative for weakness, altered level of consciousness, altered mental status, extremity weakness, paresthesias, involuntary movement, seizure and syncope.       Physical Exam Updated Vital Signs BP (!) 149/85 (BP Location: Right Arm)   Pulse (!) 105   Temp 98.1 F (36.7 C) (Oral)   Resp 16    LMP 12/26/2012   SpO2 95%   Physical Exam 1220: Physical examination:  Nursing notes reviewed; Vital signs and O2 SAT reviewed;  Constitutional: Well developed, Well nourished, Well hydrated, In no acute distress; Head:  Normocephalic, atraumatic; Eyes: EOMI, PERRL, No scleral icterus; ENMT: Mouth and pharynx normal, Mucous membranes moist; Neck: Supple, Full range of motion, No lymphadenopathy; Cardiovascular: Regular rate and rhythm, No gallop; Respiratory: Breath sounds clear & equal bilaterally, No wheezes.  Speaking full sentences with ease, Normal respiratory effort/excursion; Chest: Nontender, Movement normal; Abdomen: Soft, +diffuse tenderness to palp. No rebound or guarding.  Nondistended, Normal bowel sounds. Rectal exam performed w/permission of pt and ED RN chaperone present.  Anal tone normal.  Non-tender, soft brown stool in rectal vault, heme neg.  No fissures, no external hemorrhoids, no palp masses.;;; Genitourinary: No CVA tenderness; Extremities: Peripheral pulses normal, No tenderness, No edema, No calf edema or asymmetry.; Neuro: AA&Ox3, Major CN grossly intact.  Speech clear. No gross focal motor or sensory deficits in extremities.; Skin: Color normal, Warm, Dry.; Psych:  Anxious.   ED Treatments / Results  Labs (all labs ordered are listed, but only abnormal results are displayed)   EKG None  Radiology   Procedures Procedures (including critical care time)  Medications Ordered in ED Medications  dicyclomine (BENTYL) injection 20 mg (has no administration in time range)  famotidine (PEPCID) IVPB 20 mg premix (has no administration in time range)  promethazine (PHENERGAN) injection 12.5 mg (has no administration in time range)     Initial Impression / Assessment and Plan / ED Course  I have reviewed the triage vital signs and the nursing notes.  Pertinent labs & imaging results that were available during my care of the patient were reviewed by me  and considered  in my medical decision making (see chart for details).  MDM Reviewed: previous chart, nursing note and vitals Reviewed previous: labs and CT scan Interpretation: labs, x-ray and CT scan   Results for orders placed or performed during the hospital encounter of 05/25/18  Lipase, blood  Result Value Ref Range   Lipase 20 11 - 51 U/L  Comprehensive metabolic panel  Result Value Ref Range   Sodium 141 135 - 145 mmol/L   Potassium 3.7 3.5 - 5.1 mmol/L   Chloride 104 98 - 111 mmol/L   CO2 28 22 - 32 mmol/L   Glucose, Bld 118 (H) 70 - 99 mg/dL   BUN 15 6 - 20 mg/dL   Creatinine, Ser 1.61 0.44 - 1.00 mg/dL   Calcium 8.8 (L) 8.9 - 10.3 mg/dL   Total Protein 7.0 6.5 - 8.1 g/dL   Albumin 3.6 3.5 - 5.0 g/dL   AST 24 15 - 41 U/L   ALT 25 0 - 44 U/L   Alkaline Phosphatase 69 38 - 126 U/L   Total Bilirubin 0.6 0.3 - 1.2 mg/dL   GFR calc non Af Amer >60 >60 mL/min   GFR calc Af Amer >60 >60 mL/min   Anion gap 9 5 - 15  Urinalysis, Routine w reflex microscopic  Result Value Ref Range   Color, Urine YELLOW YELLOW   APPearance CLEAR CLEAR   Specific Gravity, Urine >1.046 (H) 1.005 - 1.030   pH 8.0 5.0 - 8.0   Glucose, UA NEGATIVE NEGATIVE mg/dL   Hgb urine dipstick MODERATE (A) NEGATIVE   Bilirubin Urine NEGATIVE NEGATIVE   Ketones, ur NEGATIVE NEGATIVE mg/dL   Protein, ur NEGATIVE NEGATIVE mg/dL   Nitrite NEGATIVE NEGATIVE   Leukocytes, UA NEGATIVE NEGATIVE   RBC / HPF 21-50 0 - 5 RBC/hpf   WBC, UA 0-5 0 - 5 WBC/hpf   Bacteria, UA NONE SEEN NONE SEEN   Squamous Epithelial / LPF 0-5 0 - 5  CBC with Differential/Platelet  Result Value Ref Range   WBC 8.9 4.0 - 10.5 K/uL   RBC 4.34 3.87 - 5.11 MIL/uL   Hemoglobin 13.5 12.0 - 15.0 g/dL   HCT 09.6 04.5 - 40.9 %   MCV 94.0 78.0 - 100.0 fL   MCH 31.1 26.0 - 34.0 pg   MCHC 33.1 30.0 - 36.0 g/dL   RDW 81.1 91.4 - 78.2 %   Platelets 297 150 - 400 K/uL   Neutrophils Relative % 73 %   Neutro Abs 6.6 1.7 - 7.7 K/uL   Lymphocytes Relative  21 %   Lymphs Abs 1.8 0.7 - 4.0 K/uL   Monocytes Relative 5 %   Monocytes Absolute 0.4 0.1 - 1.0 K/uL   Eosinophils Relative 1 %   Eosinophils Absolute 0.1 0.0 - 0.7 K/uL   Basophils Relative 0 %   Basophils Absolute 0.0 0.0 - 0.1 K/uL   Dg Chest 2 View Result Date: 05/25/2018 CLINICAL DATA:  Fever and chills.  Fatigue. EXAM: CHEST - 2 VIEW COMPARISON:  October 24, 2014. FINDINGS: Lungs are clear. Heart size and pulmonary vascularity are normal. No adenopathy. No pneumothorax. No bone lesions. There is aortic atherosclerosis. IMPRESSION: No edema or consolidation.  There is aortic atherosclerosis. Aortic Atherosclerosis (ICD10-I70.0). Electronically Signed   By: Bretta Bang III M.D.   On: 05/25/2018 14:14   Ct Abdomen Pelvis W Contrast Result Date: 05/25/2018 CLINICAL DATA:  Right-sided abdominal pain for 1 month. EXAM: CT ABDOMEN AND PELVIS WITH  CONTRAST TECHNIQUE: Multidetector CT imaging of the abdomen and pelvis was performed using the standard protocol following bolus administration of intravenous contrast. CONTRAST:  ISOVUE-300 IOPAMIDOL (ISOVUE-300) INJECTION 61% COMPARISON:  CT scan of May 19, 2018. FINDINGS: Lower chest: No acute abnormality. Hepatobiliary: No gallstones are noted. No biliary dilatation is noted. Fatty infiltration of the liver is noted. Pancreas: Unremarkable. No pancreatic ductal dilatation or surrounding inflammatory changes. Spleen: Normal in size without focal abnormality. Adrenals/Urinary Tract: Adrenal glands are unremarkable. Kidneys are normal, without renal calculi, focal lesion, or hydronephrosis. Bladder is unremarkable. Stomach/Bowel: Stomach is within normal limits. Appendix appears normal. No evidence of bowel wall thickening, distention, or inflammatory changes. Vascular/Lymphatic: Status post aortobifemoral graft placement. The graft and its limbs are widely patent. No significant adenopathy is noted. Reproductive: Uterus and bilateral adnexa are  unremarkable. Other: No abdominal wall hernia or abnormality. No abdominopelvic ascites. Musculoskeletal: No acute or significant osseous findings. IMPRESSION: Fatty infiltration of the liver. Status post aortobifemoral graft placement. No acute abnormality seen in the abdomen or pelvis. Electronically Signed   By: Lupita Raider, M.D.   On: 05/25/2018 14:40     1225:  Epic chart reviewed: pt was evaluated by PMD on 05/19/2018 for this complaint, had reassuring labs/CT scan, and tx for possible GERD/gastritis/PUD. I do not see where any antibiotics were prescribed at the office visit and ED Pharm Tech did rectify meds list and there were no abx.    1630:  Pt asking multiple times throughout her ED visit "is my appendix ok?"  Reassured pt multiple times. Pt has tol PO well while in the ED without N/V.  No stooling while in the ED.  Abd benign, VSS. Feels better and wants to go home now. States she already has rx bentyl. Encouraged to f/u with GI MD. Dx and testing d/w pt and family.  Questions answered.  Verb understanding, agreeable to d/c home with outpt f/u.       Final Clinical Impressions(s) / ED Diagnoses   Final diagnoses:  None    ED Discharge Orders    None       Samuel Jester, DO 05/29/18 0405

## 2018-05-25 NOTE — ED Triage Notes (Signed)
Pt states she was at PCP last week and had a CT scan which showed a hernia and a "bacterial infection". Has been having abd pain and bloating for 6 weeks. Was given antibx which she is still taking. Reports fecal incontinence with bright red blood.

## 2018-05-25 NOTE — ED Notes (Signed)
Patient transported to X-ray 

## 2018-05-27 ENCOUNTER — Telehealth: Payer: Self-pay | Admitting: Family Medicine

## 2018-05-27 ENCOUNTER — Other Ambulatory Visit: Payer: Self-pay | Admitting: Family Medicine

## 2018-05-27 MED ORDER — TRAMADOL HCL 50 MG PO TABS: ORAL_TABLET | ORAL | 1 refills | Status: DC

## 2018-05-27 NOTE — Telephone Encounter (Signed)
Patient would like refill on her pain medication  [id number]

## 2018-05-31 ENCOUNTER — Other Ambulatory Visit: Payer: Self-pay | Admitting: Family Medicine

## 2018-05-31 DIAGNOSIS — R140 Abdominal distension (gaseous): Secondary | ICD-10-CM

## 2018-05-31 DIAGNOSIS — R1084 Generalized abdominal pain: Secondary | ICD-10-CM

## 2018-07-15 ENCOUNTER — Other Ambulatory Visit: Payer: Self-pay | Admitting: Family Medicine

## 2018-07-15 NOTE — Telephone Encounter (Signed)
Requesting refill    Flexeril  LOV: 01/11/18  LRF:  03/18/17

## 2018-07-18 ENCOUNTER — Other Ambulatory Visit: Payer: Self-pay | Admitting: Family Medicine

## 2018-07-18 NOTE — Telephone Encounter (Signed)
Ok to refill??  Last office visit 05/19/2018.  Last refill 04/15/2018, #2 refills.

## 2018-08-25 ENCOUNTER — Encounter: Payer: Self-pay | Admitting: Family Medicine

## 2018-08-25 ENCOUNTER — Ambulatory Visit (INDEPENDENT_AMBULATORY_CARE_PROVIDER_SITE_OTHER): Payer: Medicare Other | Admitting: Family Medicine

## 2018-08-25 VITALS — BP 144/80 | HR 99 | Temp 98.0°F | Resp 18 | Ht <= 58 in | Wt 139.0 lb

## 2018-08-25 DIAGNOSIS — R1013 Epigastric pain: Secondary | ICD-10-CM | POA: Diagnosis not present

## 2018-08-25 MED ORDER — PANTOPRAZOLE SODIUM 40 MG PO TBEC: 40.0000 mg | DELAYED_RELEASE_TABLET | Freq: Every day | ORAL | 3 refills | Status: DC

## 2018-08-25 NOTE — Progress Notes (Signed)
Subjective:    Patient ID: Felicia [name], female    DOB: 07-29-1966, 52 y.o.   MRN: [id number]  HPI Patient saw my partner earlier this summer and eventually went to the emergency room with abdominal pain.  CT scan was obtained in the emergency room that revealed no specific cause of her abdominal pain.  I reviewed the CT scan results.  Patient did not follow-up until now.  She reports daily epigastric abdominal pain located just below the xiphoid process.  The pain is intensified 10 to 15 minutes after eating.  The type of food does not matter.  She reports feeling bloated.  She is tender to palpation in the epigastric area as well as in the right upper quadrant.  She also reports occasional nausea.  She denies any vomiting.  She denies any melena.  She denies any hematochezia.  She denies any fevers or chills.  Today on exam, she has voluntary guarding in the right upper quadrant and in the left upper quadrant and below the xiphoid process.  She has normal bowel sounds.  Abdomen is soft nondistended.  Patient is requesting hydrocodone for pain Past Medical History:  Diagnosis Date  . Allergy    year around  . Anemia   . Anxiety   . Arthritis   . Asthma   . Atrial fibrillation (HCC)   . Bipolar 1 disorder (HCC)   . Depression   . Ectopic pregnancy   . Headache   . Heart murmur   . Hyperlipidemia   . Insomnia   . Peripheral arterial disease (HCC)   . Pneumonia   . PUD (peptic ulcer disease)   . Right knee pain 12-05-2015   per pt.  she fell out of her husband's truck and landed on her right knee  . Shortness of breath dyspnea    Past Surgical History:  Procedure Laterality Date  . ABDOMINAL AORTAGRAM N/A 09/12/2012   Procedure: ABDOMINAL AORTAGRAM;  Surgeon: Fransisco Hertz, MD;  Location: [facility] CATH LAB;  Service: Cardiovascular;  Laterality: N/A;  . AORTA - BILATERAL FEMORAL ARTERY BYPASS GRAFT N/A 10/23/2014   Procedure: AORTOBIFEMORAL BYPASS GRAFT;  Surgeon: Chuck Hint,  MD;  Location: [facility] OR;  Service: Vascular;  Laterality: N/A;  . BACK SURGERY    . CESAREAN SECTION    . COLONOSCOPY  01-14-2004   > 11 yr ago Dr Loreta Ave - normal - report in epic   . DILATATION & CURETTAGE/HYSTEROSCOPY WITH MYOSURE N/A 10/15/2015   Procedure: DILATATION & CURETTAGE/HYSTEROSCOPY WITH MYOSURE;  Surgeon: Ok Edwards, MD;  Location: [facility] ORS;  Service: Gynecology;  Laterality: N/A;  . ESOPHAGOGASTRODUODENOSCOPY  01-14-2004   gastritis- dr Loreta Ave- in epic already   . NOSE SURGERY    . NOSE SURGERY    . SHOULDER SURGERY     rotator cuff repair   . TONSILLECTOMY    . TUBAL LIGATION    . UPPER GASTROINTESTINAL ENDOSCOPY  01-14-2004   in epic- dr Loreta Ave- gastritis    Current Outpatient Medications on File Prior to Visit  Medication Sig Dispense Refill  . aspirin 325 MG tablet Take 325 mg by mouth daily.    Marland Kitchen atorvastatin (LIPITOR) 80 MG tablet TAKE 1 TABLET (80 MG TOTAL) BY MOUTH DAILY. 90 tablet 3  . beclomethasone (QVAR) 80 MCG/ACT inhaler Inhale 2 puffs into the lungs 2 (two) times daily.    . clonazePAM (KLONOPIN) 1 MG tablet TAKE 1 TABLET BY MOUTH 3 TIMES A DAY 90  tablet 2  . cyclobenzaprine (FLEXERIL) 10 MG tablet TAKE 1 TABLET BY MOUTH 3 TIMES A DAY AS NEEDED FOR MUSCLE SPASMS 90 tablet 3  . diphenhydrAMINE (BENADRYL) 50 MG capsule Take 50 mg by mouth every 6 (six) hours as needed for itching.    Marland Kitchen EPIPEN 2-PAK 0.3 MG/0.3ML SOAJ injection USE AS DIRECTED 2 Device 0  . hydrochlorothiazide (MICROZIDE) 12.5 MG capsule Take 1 capsule (12.5 mg total) by mouth daily. 90 capsule 3  . losartan (COZAAR) 50 MG tablet Please specify directions, refills and quantity 90 tablet 3  . meloxicam (MOBIC) 15 MG tablet Take 1 tablet (15 mg total) by mouth daily. 90 tablet 0  . ondansetron (ZOFRAN) 4 MG tablet Take 1 tablet (4 mg total) by mouth every 8 (eight) hours as needed for nausea or vomiting. 20 tablet 0  . traMADol (ULTRAM) 50 MG tablet TAKE 2 TABLETS BY MOUTH TWICE A DAY AS NEEDED FOR PAIN  120 tablet 1  . dicyclomine (BENTYL) 20 MG tablet TAKE 1 TABLET (20 MG TOTAL) BY MOUTH 3 (THREE) TIMES DAILY WITH MEALS AS NEEDED FOR SPASMS. (Patient not taking: Reported on 08/25/2018) 20 tablet 0   No current facility-administered medications on file prior to visit.    Allergies  Allergen Reactions  . Augmentin [Amoxicillin-Pot Clavulanate] Diarrhea  . Bactrim [Sulfamethoxazole-Trimethoprim] Hives  . Bee Pollen Swelling    Also ticks   . Cephalosporins Hives  . Codeine Nausea Only  . Doxycycline Hives  . Tetracyclines & Related Hives  . Reflex Pain Relief [Menthol] Rash    Muscle Rub   Social History   Socioeconomic History  . Marital status: Married    Spouse name: Not on file  . Number of children: Not on file  . Years of education: Not on file  . Highest education level: Not on file  Occupational History  . Not on file  Social Needs  . Financial resource strain: Not on file  . Food insecurity:    Worry: Not on file    Inability: Not on file  . Transportation needs:    Medical: Not on file    Non-medical: Not on file  Tobacco Use  . Smoking status: Current Every Day Smoker    Packs/day: 0.25    Years: 30.00    Pack years: 7.50    Types: Cigarettes  . Smokeless tobacco: Never Used  Substance and Sexual Activity  . Alcohol use: Yes    Alcohol/week: 1.0 standard drinks    Types: 1 Shots of liquor per week    Comment: very occassionally on social occassions  . Drug use: No  . Sexual activity: Yes  Lifestyle  . Physical activity:    Days per week: Not on file    Minutes per session: Not on file  . Stress: Not on file  Relationships  . Social connections:    Talks on phone: Not on file    Gets together: Not on file    Attends religious service: Not on file    Active member of club or organization: Not on file    Attends meetings of clubs or organizations: Not on file    Relationship status: Not on file  . Intimate partner violence:    Fear of current or  ex partner: Not on file    Emotionally abused: Not on file    Physically abused: Not on file    Forced sexual activity: Not on file  Other Topics Concern  . Not  on file  Social History Narrative  . Not on file   Family History  Adopted: Yes  Problem Relation Age of Onset  . Heart disease Mother   . Diabetes Mother   . Hyperlipidemia Mother   . Hypertension Mother   . Heart attack Mother   . Heart disease Father   . Diabetes Father   . Heart attack Father   . Hypertension Father   . Bladder Cancer Father   . Diabetes Brother   . Lung cancer Paternal Grandfather   . Colon cancer Neg Hx   . Colon polyps Neg Hx   . Rectal cancer Neg Hx   . Stomach cancer Neg Hx       Review of Systems  All other systems reviewed and are negative.      Objective:   Physical Exam  Constitutional: She is oriented to person, place, and time. She appears well-developed and well-nourished. No distress.  HENT:  Head: Normocephalic and atraumatic.  Right Ear: External ear normal.  Left Ear: External ear normal.  Nose: Nose normal.  Mouth/Throat: Oropharynx is clear and moist. No oropharyngeal exudate.  Eyes: Pupils are equal, round, and reactive to light. Conjunctivae and EOM are normal. Right eye exhibits no discharge. Left eye exhibits no discharge. No scleral icterus.  Neck: Normal range of motion. Neck supple. No JVD present. No tracheal deviation present.  Cardiovascular: Normal rate, regular rhythm and intact distal pulses.  Murmur heard. Pulmonary/Chest: Effort normal and breath sounds normal. No stridor. No respiratory distress. She has no wheezes. She has no rales. She exhibits no tenderness.  Abdominal: Soft. Bowel sounds are normal. She exhibits no mass. There is tenderness. There is guarding. There is no rebound.  Genitourinary: Vagina normal and uterus normal. Uterus is not enlarged. Cervix exhibits no motion tenderness. Right adnexum displays no mass. Left adnexum displays no  mass. No vaginal discharge found.  Musculoskeletal: Normal range of motion. She exhibits no edema, tenderness or deformity.  Lymphadenopathy:    She has no cervical adenopathy.  Neurological: She is alert and oriented to person, place, and time. She has normal reflexes. No cranial nerve deficit. She exhibits normal muscle tone. Coordination normal.  Skin: Skin is warm. No rash noted. She is not diaphoretic. No erythema. No pallor.  Psychiatric: She has a normal mood and affect. Her behavior is normal. Judgment and thought content normal.  Vitals reviewed.         Assessment & Plan:  Epigastric pain - Plan: pantoprazole (PROTONIX) 40 MG tablet, COMPLETE METABOLIC PANEL WITH GFR, CBC with Differential/Platelet, Lipase, H. pylori breath test  I suspect peptic ulcer disease.  She has not been taking pantoprazole.  I recommended starting Protonix 40 mg p.o. daily immediately.  I recommended discontinuation of all NSAIDs including meloxicam immediately.  I declined hydrocodone because I do not want to simply mass the pain but we need to know if it is getting worse.  I will check a lipase to evaluate for pancreatitis.  I will check an H. pylori breath test to evaluate for H. pylori.  I will obtain a right upper quadrant ultrasound to rule out cholelithiasis.  Recheck the patient in 2 weeks or sooner if worse.

## 2018-08-26 LAB — LIPASE: Lipase: 15 U/L (ref 7–60)

## 2018-08-26 LAB — COMPLETE METABOLIC PANEL WITH GFR
AG Ratio: 1.7 (calc) (ref 1.0–2.5)
ALT: 20 U/L (ref 6–29)
AST: 20 U/L (ref 10–35)
Albumin: 4.2 g/dL (ref 3.6–5.1)
Alkaline phosphatase (APISO): 73 U/L (ref 33–130)
BILIRUBIN TOTAL: 0.4 mg/dL (ref 0.2–1.2)
BUN: 18 mg/dL (ref 7–25)
CHLORIDE: 100 mmol/L (ref 98–110)
CO2: 29 mmol/L (ref 20–32)
Calcium: 9 mg/dL (ref 8.6–10.4)
Creat: 0.92 mg/dL (ref 0.50–1.05)
GFR, EST AFRICAN AMERICAN: 82 mL/min/{1.73_m2} (ref >=60)
GFR, EST NON AFRICAN AMERICAN: 71 mL/min/{1.73_m2} (ref >=60)
GLUCOSE: 61 mg/dL — AB (ref 65–99)
Globulin: 2.5 g/dL (calc) (ref 1.9–3.7)
POTASSIUM: 3.7 mmol/L (ref 3.5–5.3)
Sodium: 140 mmol/L (ref 135–146)
TOTAL PROTEIN: 6.7 g/dL (ref 6.1–8.1)

## 2018-08-26 LAB — CBC WITH DIFFERENTIAL/PLATELET
BASOS PCT: 0.3 %
Basophils Absolute: 35 cells/uL (ref 0–200)
EOS ABS: 94 {cells}/uL (ref 15–500)
Eosinophils Relative: 0.8 %
HCT: 40.4 % (ref 35.0–45.0)
Hemoglobin: 13.8 g/dL (ref 11.7–15.5)
Lymphs Abs: 2995 cells/uL (ref 850–3900)
MCH: 30.6 pg (ref 27.0–33.0)
MCHC: 34.2 g/dL (ref 32.0–36.0)
MCV: 89.6 fL (ref 80.0–100.0)
MONOS PCT: 6.3 %
MPV: 10.4 fL (ref 7.5–12.5)
NEUTROS PCT: 67 %
Neutro Abs: 7839 cells/uL — ABNORMAL HIGH (ref 1500–7800)
Platelets: 280 10*3/uL (ref 140–400)
RBC: 4.51 10*6/uL (ref 3.80–5.10)
RDW: 12.4 % (ref 11.0–15.0)
TOTAL LYMPHOCYTE: 25.6 %
WBC mixed population: 737 cells/uL (ref 200–950)
WBC: 11.7 10*3/uL — ABNORMAL HIGH (ref 3.8–10.8)

## 2018-08-26 LAB — H. PYLORI BREATH TEST: H. pylori Breath Test: NOT DETECTED

## 2018-08-28 ENCOUNTER — Other Ambulatory Visit: Payer: Self-pay | Admitting: Family Medicine

## 2018-08-29 NOTE — Telephone Encounter (Signed)
Requesting refill    Tramadol  LOV:  08/25/18  LRF:  05/27/18

## 2018-08-30 ENCOUNTER — Ambulatory Visit
Admission: RE | Admit: 2018-08-30 | Discharge: 2018-08-30 | Disposition: A | Discharge status: Home / Self Care | Payer: Medicare Other | Source: Ambulatory Visit | Attending: Family Medicine | Admitting: Family Medicine

## 2018-08-30 DIAGNOSIS — R1013 Epigastric pain: Secondary | ICD-10-CM

## 2018-09-08 ENCOUNTER — Encounter: Payer: Self-pay | Admitting: Family Medicine

## 2018-09-08 ENCOUNTER — Ambulatory Visit (INDEPENDENT_AMBULATORY_CARE_PROVIDER_SITE_OTHER): Payer: Medicare Other | Admitting: Family Medicine

## 2018-09-08 VITALS — BP 190/90 | HR 97 | Temp 98.1°F | Resp 18 | Ht <= 58 in | Wt 146.0 lb

## 2018-09-08 DIAGNOSIS — I10 Essential (primary) hypertension: Secondary | ICD-10-CM | POA: Diagnosis not present

## 2018-09-08 DIAGNOSIS — J4541 Moderate persistent asthma with (acute) exacerbation: Secondary | ICD-10-CM

## 2018-09-08 DIAGNOSIS — R1013 Epigastric pain: Secondary | ICD-10-CM

## 2018-09-08 MED ORDER — PREDNISONE 20 MG PO TABS: ORAL_TABLET | ORAL | 0 refills | Status: DC

## 2018-09-08 MED ORDER — AMLODIPINE BESYLATE 10 MG PO TABS: 10.0000 mg | ORAL_TABLET | Freq: Every day | ORAL | 3 refills | Status: DC

## 2018-09-08 MED ORDER — AZITHROMYCIN 250 MG PO TABS: ORAL_TABLET | ORAL | 0 refills | Status: DC

## 2018-09-08 NOTE — Progress Notes (Signed)
Subjective:    Patient ID: Felicia [name], female    DOB: 1966-08-15, 52 y.o.   MRN: [id number]  HPI  08/25/18 Patient saw my partner earlier this summer and eventually went to the emergency room with abdominal pain.  CT scan was obtained in the emergency room that revealed no specific cause of her abdominal pain.  I reviewed the CT scan results.  Patient did not follow-up until now.  She reports daily epigastric abdominal pain located just below the xiphoid process.  The pain is intensified 10 to 15 minutes after eating.  The type of food does not matter.  She reports feeling bloated.  She is tender to palpation in the epigastric area as well as in the right upper quadrant.  She also reports occasional nausea.  She denies any vomiting.  She denies any melena.  She denies any hematochezia.  She denies any fevers or chills.  Today on exam, she has voluntary guarding in the right upper quadrant and in the left upper quadrant and below the xiphoid process.  She has normal bowel sounds.  Abdomen is soft nondistended.  Patient is requesting hydrocodone for pain.  At that time, my plan was: I suspect peptic ulcer disease.  She has not been taking pantoprazole.  I recommended starting Protonix 40 mg p.o. daily immediately.  I recommended discontinuation of all NSAIDs including meloxicam immediately.  I declined hydrocodone because I do not want to simply mask the pain but we need to know if it is getting worse.  I will check a lipase to evaluate for pancreatitis.  I will check an H. pylori breath test to evaluate for H. pylori.  I will obtain a right upper quadrant ultrasound to rule out cholelithiasis.  Recheck the patient in 2 weeks or sooner if worse.  09/08/18 Labs are normal except for a white count of 11.7.  Right upper quadrant ultrasound revealed: IMPRESSION: Diffuse increase in liver echogenicity, a finding felt to be indicative of hepatic steatosis. There is suspected fatty sparing near the  gallbladder fossa. While no focal liver lesions are evident beyond apparent fatty sparing near the gallbladder fossa, it must be cautioned that the sensitivity of ultrasound for detection of focal liver lesions is diminished in this circumstance.  H. pylori breath test was negative.  Lipase was normal.  She is here today for follow-up. Her epigastric abdominal pain has improved substantially on Protonix.  She occasionally continues to have diarrhea after eating which I do not believe is related to her pain.  She also reports bloating after eating.  However her abdominal discomfort that she was experiencing last time has improved dramatically.  She feels like she is getting much better leading me to believe that Protonix is likely treating gastritis versus peptic ulcer disease.  She does report a cough x3 weeks that is productive of brown sputum.  On exam she is wheezing bilaterally with rhonchorous breath sounds in both lungs and bibasilar crackles.  She continues to smoke.  Her blood pressure substantially elevated today and I repeated the value and verified it myself.   Past Medical History:  Diagnosis Date  . Allergy    year around  . Anemia   . Anxiety   . Arthritis   . Asthma   . Atrial fibrillation (HCC)   . Bipolar 1 disorder (HCC)   . Depression   . Ectopic pregnancy   . Headache   . Heart murmur   . Hyperlipidemia   .  Insomnia   . Peripheral arterial disease (HCC)   . Pneumonia   . PUD (peptic ulcer disease)   . Right knee pain 12-05-2015   per pt.  she fell out of her husband's truck and landed on her right knee  . Shortness of breath dyspnea    Past Surgical History:  Procedure Laterality Date  . ABDOMINAL AORTAGRAM N/A 09/12/2012   Procedure: ABDOMINAL AORTAGRAM;  Surgeon: Fransisco Hertz, MD;  Location: [facility] CATH LAB;  Service: Cardiovascular;  Laterality: N/A;  . AORTA - BILATERAL FEMORAL ARTERY BYPASS GRAFT N/A 10/23/2014   Procedure: AORTOBIFEMORAL BYPASS GRAFT;   Surgeon: Chuck Hint, MD;  Location: [facility] OR;  Service: Vascular;  Laterality: N/A;  . BACK SURGERY    . CESAREAN SECTION    . COLONOSCOPY  01-14-2004   > 11 yr ago Dr Loreta Ave - normal - report in epic   . DILATATION & CURETTAGE/HYSTEROSCOPY WITH MYOSURE N/A 10/15/2015   Procedure: DILATATION & CURETTAGE/HYSTEROSCOPY WITH MYOSURE;  Surgeon: Ok Edwards, MD;  Location: [facility] ORS;  Service: Gynecology;  Laterality: N/A;  . ESOPHAGOGASTRODUODENOSCOPY  01-14-2004   gastritis- dr Loreta Ave- in epic already   . NOSE SURGERY    . NOSE SURGERY    . SHOULDER SURGERY     rotator cuff repair   . TONSILLECTOMY    . TUBAL LIGATION    . UPPER GASTROINTESTINAL ENDOSCOPY  01-14-2004   in epic- dr Loreta Ave- gastritis    Current Outpatient Medications on File Prior to Visit  Medication Sig Dispense Refill  . aspirin 325 MG tablet Take 325 mg by mouth daily.    Marland Kitchen atorvastatin (LIPITOR) 80 MG tablet TAKE 1 TABLET (80 MG TOTAL) BY MOUTH DAILY. 90 tablet 3  . beclomethasone (QVAR) 80 MCG/ACT inhaler Inhale 2 puffs into the lungs 2 (two) times daily.    . clonazePAM (KLONOPIN) 1 MG tablet TAKE 1 TABLET BY MOUTH 3 TIMES A DAY 90 tablet 2  . cyclobenzaprine (FLEXERIL) 10 MG tablet TAKE 1 TABLET BY MOUTH 3 TIMES A DAY AS NEEDED FOR MUSCLE SPASMS 90 tablet 3  . dicyclomine (BENTYL) 20 MG tablet TAKE 1 TABLET (20 MG TOTAL) BY MOUTH 3 (THREE) TIMES DAILY WITH MEALS AS NEEDED FOR SPASMS. (Patient not taking: Reported on 08/25/2018) 20 tablet 0  . diphenhydrAMINE (BENADRYL) 50 MG capsule Take 50 mg by mouth every 6 (six) hours as needed for itching.    Marland Kitchen EPIPEN 2-PAK 0.3 MG/0.3ML SOAJ injection USE AS DIRECTED 2 Device 0  . hydrochlorothiazide (MICROZIDE) 12.5 MG capsule Take 1 capsule (12.5 mg total) by mouth daily. 90 capsule 3  . losartan (COZAAR) 50 MG tablet Please specify directions, refills and quantity 90 tablet 3  . meloxicam (MOBIC) 15 MG tablet Take 1 tablet (15 mg total) by mouth daily. 90 tablet 0  .  ondansetron (ZOFRAN) 4 MG tablet Take 1 tablet (4 mg total) by mouth every 8 (eight) hours as needed for nausea or vomiting. 20 tablet 0  . pantoprazole (PROTONIX) 40 MG tablet Take 1 tablet (40 mg total) by mouth daily. 30 tablet 3  . traMADol (ULTRAM) 50 MG tablet TAKE 2 TABLETS BY MOUTH TWICE A DAY AS NEEDED FOR PAIN 120 tablet 1   No current facility-administered medications on file prior to visit.    Allergies  Allergen Reactions  . Augmentin [Amoxicillin-Pot Clavulanate] Diarrhea  . Bactrim [Sulfamethoxazole-Trimethoprim] Hives  . Bee Pollen Swelling    Also ticks   . Cephalosporins Hives  .  Codeine Nausea Only  . Doxycycline Hives  . Tetracyclines & Related Hives  . Reflex Pain Relief [Menthol] Rash    Muscle Rub   Social History   Socioeconomic History  . Marital status: Married    Spouse name: Not on file  . Number of children: Not on file  . Years of education: Not on file  . Highest education level: Not on file  Occupational History  . Not on file  Social Needs  . Financial resource strain: Not on file  . Food insecurity:    Worry: Not on file    Inability: Not on file  . Transportation needs:    Medical: Not on file    Non-medical: Not on file  Tobacco Use  . Smoking status: Current Every Day Smoker    Packs/day: 0.25    Years: 30.00    Pack years: 7.50    Types: Cigarettes  . Smokeless tobacco: Never Used  Substance and Sexual Activity  . Alcohol use: Yes    Alcohol/week: 1.0 standard drinks    Types: 1 Shots of liquor per week    Comment: very occassionally on social occassions  . Drug use: No  . Sexual activity: Yes  Lifestyle  . Physical activity:    Days per week: Not on file    Minutes per session: Not on file  . Stress: Not on file  Relationships  . Social connections:    Talks on phone: Not on file    Gets together: Not on file    Attends religious service: Not on file    Active member of club or organization: Not on file    Attends  meetings of clubs or organizations: Not on file    Relationship status: Not on file  . Intimate partner violence:    Fear of current or ex partner: Not on file    Emotionally abused: Not on file    Physically abused: Not on file    Forced sexual activity: Not on file  Other Topics Concern  . Not on file  Social History Narrative  . Not on file   Family History  Adopted: Yes  Problem Relation Age of Onset  . Heart disease Mother   . Diabetes Mother   . Hyperlipidemia Mother   . Hypertension Mother   . Heart attack Mother   . Heart disease Father   . Diabetes Father   . Heart attack Father   . Hypertension Father   . Bladder Cancer Father   . Diabetes Brother   . Lung cancer Paternal Grandfather   . Colon cancer Neg Hx   . Colon polyps Neg Hx   . Rectal cancer Neg Hx   . Stomach cancer Neg Hx       Review of Systems  All other systems reviewed and are negative.      Objective:   Physical Exam  Constitutional: She is oriented to person, place, and time. She appears well-developed and well-nourished. No distress.  HENT:  Head: Normocephalic and atraumatic.  Right Ear: External ear normal.  Left Ear: External ear normal.  Nose: Nose normal.  Mouth/Throat: Oropharynx is clear and moist. No oropharyngeal exudate.  Eyes: Pupils are equal, round, and reactive to light. Conjunctivae and EOM are normal. Right eye exhibits no discharge. Left eye exhibits no discharge. No scleral icterus.  Neck: Normal range of motion. Neck supple. No JVD present. No tracheal deviation present.  Cardiovascular: Normal rate, regular rhythm and intact distal  pulses.  Murmur heard. Pulmonary/Chest: Effort normal. No stridor. No respiratory distress. She has wheezes. She has rales. She exhibits no tenderness.  Abdominal: Soft. Bowel sounds are normal. She exhibits no mass. There is no tenderness. There is no rebound and no guarding.  Genitourinary: Vagina normal and uterus normal. Uterus is not  enlarged. Cervix exhibits no motion tenderness. Right adnexum displays no mass. Left adnexum displays no mass. No vaginal discharge found.  Musculoskeletal: Normal range of motion. She exhibits no edema, tenderness or deformity.  Lymphadenopathy:    She has no cervical adenopathy.  Neurological: She is alert and oriented to person, place, and time. She has normal reflexes. No cranial nerve deficit. She exhibits normal muscle tone. Coordination normal.  Skin: Skin is warm. No rash noted. She is not diaphoretic. No erythema. No pallor.  Psychiatric: She has a normal mood and affect. Her behavior is normal. Judgment and thought content normal.  Vitals reviewed.         Assessment & Plan:  Essential hypertension  Epigastric pain  Moderate persistent asthmatic bronchitis with acute exacerbation  Begin amlodipine 10 mg a day for hypertension.  Recheck blood pressure in 1 week.  I believe the epigastric abdominal pain is likely due to gastritis versus peptic ulcer disease.  Continue Protonix.  Consider EGD if symptoms persist or worsen.  Treat asthmatic bronchitis with a prednisone taper pack, albuterol 2 puffs every 6 hours as needed, and a Z-Pak.  Recheck next week or sooner if worse

## 2018-09-14 ENCOUNTER — Other Ambulatory Visit: Payer: Self-pay | Admitting: Family Medicine

## 2018-09-14 MED ORDER — CYCLOBENZAPRINE HCL 10 MG PO TABS: ORAL_TABLET | ORAL | 1 refills | Status: DC

## 2018-09-15 ENCOUNTER — Encounter: Payer: Self-pay | Admitting: Family Medicine

## 2018-09-15 ENCOUNTER — Ambulatory Visit: Payer: Medicare Other | Admitting: Family Medicine

## 2018-09-15 VITALS — BP 126/74 | HR 88 | Temp 98.3°F | Resp 18 | Ht <= 58 in | Wt 144.0 lb

## 2018-09-15 DIAGNOSIS — R1013 Epigastric pain: Secondary | ICD-10-CM | POA: Diagnosis not present

## 2018-09-15 DIAGNOSIS — I10 Essential (primary) hypertension: Secondary | ICD-10-CM

## 2018-09-15 NOTE — Progress Notes (Signed)
Subjective:    Patient ID: Felicia [name], female    DOB: May 04, 1966, 52 y.o.   MRN: [id number]  HPI  08/25/18 Patient saw my partner earlier this summer and eventually went to the emergency room with abdominal pain.  CT scan was obtained in the emergency room that revealed no specific cause of her abdominal pain.  I reviewed the CT scan results.  Patient did not follow-up until now.  She reports daily epigastric abdominal pain located just below the xiphoid process.  The pain is intensified 10 to 15 minutes after eating.  The type of food does not matter.  She reports feeling bloated.  She is tender to palpation in the epigastric area as well as in the right upper quadrant.  She also reports occasional nausea.  She denies any vomiting.  She denies any melena.  She denies any hematochezia.  She denies any fevers or chills.  Today on exam, she has voluntary guarding in the right upper quadrant and in the left upper quadrant and below the xiphoid process.  She has normal bowel sounds.  Abdomen is soft nondistended.  Patient is requesting hydrocodone for pain.  At that time, my plan was: I suspect peptic ulcer disease.  She has not been taking pantoprazole.  I recommended starting Protonix 40 mg p.o. daily immediately.  I recommended discontinuation of all NSAIDs including meloxicam immediately.  I declined hydrocodone because I do not want to simply mask the pain but we need to know if it is getting worse.  I will check a lipase to evaluate for pancreatitis.  I will check an H. pylori breath test to evaluate for H. pylori.  I will obtain a right upper quadrant ultrasound to rule out cholelithiasis.  Recheck the patient in 2 weeks or sooner if worse.  09/08/18 Labs are normal except for a white count of 11.7.  Right upper quadrant ultrasound revealed: IMPRESSION: Diffuse increase in liver echogenicity, a finding felt to be indicative of hepatic steatosis. There is suspected fatty sparing near the  gallbladder fossa. While no focal liver lesions are evident beyond apparent fatty sparing near the gallbladder fossa, it must be cautioned that the sensitivity of ultrasound for detection of focal liver lesions is diminished in this circumstance.  H. pylori breath test was negative.  Lipase was normal.  She is here today for follow-up. Her epigastric abdominal pain has improved substantially on Protonix.  She occasionally continues to have diarrhea after eating which I do not believe is related to her pain.  She also reports bloating after eating.  However her abdominal discomfort that she was experiencing last time has improved dramatically.  She feels like she is getting much better leading me to believe that Protonix is likely treating gastritis versus peptic ulcer disease.  She does report a cough x3 weeks that is productive of brown sputum.  On exam she is wheezing bilaterally with rhonchorous breath sounds in both lungs and bibasilar crackles.  She continues to smoke.  Her blood pressure substantially elevated today and I repeated the value and verified it myself.  At that time, my plan was: Begin amlodipine 10 mg a day for hypertension.  Recheck blood pressure in 1 week.  I believe the epigastric abdominal pain is likely due to gastritis versus peptic ulcer disease.  Continue Protonix.  Consider EGD if symptoms persist or worsen.  Treat asthmatic bronchitis with a prednisone taper pack, albuterol 2 puffs every 6 hours as needed, and a Z-Pak.  Recheck next week or sooner if worse  09/15/18 Blood pressure today is much improved.  It is 126/74.  She states her breathing is doing much better.  She still has faint expiratory wheezing however is much improved.  The rhonchi and crackles have improved.  Unfortunately she continues to smoke.  Her abdominal pain continues to remain better although she did have several episodes of diarrhea last week but this was when she was taking antibiotics and prednisone.   She denies any chest pain.  She denies any shortness of breath.  She denies any syncope.  She does report some mild dizziness as her blood pressure has dropped.   Past Medical History:  Diagnosis Date  . Allergy    year around  . Anemia   . Anxiety   . Arthritis   . Asthma   . Atrial fibrillation (HCC)   . Bipolar 1 disorder (HCC)   . Depression   . Ectopic pregnancy   . Headache   . Heart murmur   . Hyperlipidemia   . Insomnia   . Peripheral arterial disease (HCC)   . Pneumonia   . PUD (peptic ulcer disease)   . Right knee pain 12-05-2015   per pt.  she fell out of her husband's truck and landed on her right knee  . Shortness of breath dyspnea    Past Surgical History:  Procedure Laterality Date  . ABDOMINAL AORTAGRAM N/A 09/12/2012   Procedure: ABDOMINAL AORTAGRAM;  Surgeon: Fransisco Hertz, MD;  Location: [facility] CATH LAB;  Service: Cardiovascular;  Laterality: N/A;  . AORTA - BILATERAL FEMORAL ARTERY BYPASS GRAFT N/A 10/23/2014   Procedure: AORTOBIFEMORAL BYPASS GRAFT;  Surgeon: Chuck Hint, MD;  Location: [facility] OR;  Service: Vascular;  Laterality: N/A;  . BACK SURGERY    . CESAREAN SECTION    . COLONOSCOPY  01-14-2004   > 11 yr ago Dr Loreta Ave - normal - report in epic   . DILATATION & CURETTAGE/HYSTEROSCOPY WITH MYOSURE N/A 10/15/2015   Procedure: DILATATION & CURETTAGE/HYSTEROSCOPY WITH MYOSURE;  Surgeon: Ok Edwards, MD;  Location: [facility] ORS;  Service: Gynecology;  Laterality: N/A;  . ESOPHAGOGASTRODUODENOSCOPY  01-14-2004   gastritis- dr Loreta Ave- in epic already   . NOSE SURGERY    . NOSE SURGERY    . SHOULDER SURGERY     rotator cuff repair   . TONSILLECTOMY    . TUBAL LIGATION    . UPPER GASTROINTESTINAL ENDOSCOPY  01-14-2004   in epic- dr Loreta Ave- gastritis    Current Outpatient Medications on File Prior to Visit  Medication Sig Dispense Refill  . amLODipine (NORVASC) 10 MG tablet Take 1 tablet (10 mg total) by mouth daily. 90 tablet 3  . aspirin 325 MG tablet Take  325 mg by mouth daily.    Marland Kitchen atorvastatin (LIPITOR) 80 MG tablet TAKE 1 TABLET (80 MG TOTAL) BY MOUTH DAILY. 90 tablet 3  . azithromycin (ZITHROMAX) 250 MG tablet 2 tabs poqday1, 1 tab poqday 2-5 6 tablet 0  . beclomethasone (QVAR) 80 MCG/ACT inhaler Inhale 2 puffs into the lungs 2 (two) times daily.    . clonazePAM (KLONOPIN) 1 MG tablet TAKE 1 TABLET BY MOUTH 3 TIMES A DAY 90 tablet 2  . cyclobenzaprine (FLEXERIL) 10 MG tablet TAKE 1 TABLET BY MOUTH 3 TIMES A DAY AS NEEDED FOR MUSCLE SPASMS 90 tablet 1  . dicyclomine (BENTYL) 20 MG tablet TAKE 1 TABLET (20 MG TOTAL) BY MOUTH 3 (THREE) TIMES DAILY WITH MEALS AS  NEEDED FOR SPASMS. 20 tablet 0  . diphenhydrAMINE (BENADRYL) 50 MG capsule Take 50 mg by mouth every 6 (six) hours as needed for itching.    Marland Kitchen EPIPEN 2-PAK 0.3 MG/0.3ML SOAJ injection USE AS DIRECTED 2 Device 0  . hydrochlorothiazide (MICROZIDE) 12.5 MG capsule Take 1 capsule (12.5 mg total) by mouth daily. 90 capsule 3  . losartan (COZAAR) 50 MG tablet Please specify directions, refills and quantity 90 tablet 3  . meloxicam (MOBIC) 15 MG tablet Take 1 tablet (15 mg total) by mouth daily. 90 tablet 0  . ondansetron (ZOFRAN) 4 MG tablet Take 1 tablet (4 mg total) by mouth every 8 (eight) hours as needed for nausea or vomiting. 20 tablet 0  . pantoprazole (PROTONIX) 40 MG tablet Take 1 tablet (40 mg total) by mouth daily. 30 tablet 3  . traMADol (ULTRAM) 50 MG tablet TAKE 2 TABLETS BY MOUTH TWICE A DAY AS NEEDED FOR PAIN 120 tablet 1   No current facility-administered medications on file prior to visit.    Allergies  Allergen Reactions  . Augmentin [Amoxicillin-Pot Clavulanate] Diarrhea  . Bactrim [Sulfamethoxazole-Trimethoprim] Hives  . Bee Pollen Swelling    Also ticks   . Cephalosporins Hives  . Codeine Nausea Only  . Doxycycline Hives  . Tetracyclines & Related Hives  . Reflex Pain Relief [Menthol] Rash    Muscle Rub   Social History   Socioeconomic History  . Marital  status: Married    Spouse name: Not on file  . Number of children: Not on file  . Years of education: Not on file  . Highest education level: Not on file  Occupational History  . Not on file  Social Needs  . Financial resource strain: Not on file  . Food insecurity:    Worry: Not on file    Inability: Not on file  . Transportation needs:    Medical: Not on file    Non-medical: Not on file  Tobacco Use  . Smoking status: Current Every Day Smoker    Packs/day: 0.25    Years: 30.00    Pack years: 7.50    Types: Cigarettes  . Smokeless tobacco: Never Used  Substance and Sexual Activity  . Alcohol use: Yes    Alcohol/week: 1.0 standard drinks    Types: 1 Shots of liquor per week    Comment: very occassionally on social occassions  . Drug use: No  . Sexual activity: Yes  Lifestyle  . Physical activity:    Days per week: Not on file    Minutes per session: Not on file  . Stress: Not on file  Relationships  . Social connections:    Talks on phone: Not on file    Gets together: Not on file    Attends religious service: Not on file    Active member of club or organization: Not on file    Attends meetings of clubs or organizations: Not on file    Relationship status: Not on file  . Intimate partner violence:    Fear of current or ex partner: Not on file    Emotionally abused: Not on file    Physically abused: Not on file    Forced sexual activity: Not on file  Other Topics Concern  . Not on file  Social History Narrative  . Not on file   Family History  Adopted: Yes  Problem Relation Age of Onset  . Heart disease Mother   . Diabetes Mother   .  Hyperlipidemia Mother   . Hypertension Mother   . Heart attack Mother   . Heart disease Father   . Diabetes Father   . Heart attack Father   . Hypertension Father   . Bladder Cancer Father   . Diabetes Brother   . Lung cancer Paternal Grandfather   . Colon cancer Neg Hx   . Colon polyps Neg Hx   . Rectal cancer Neg Hx     . Stomach cancer Neg Hx       Review of Systems  All other systems reviewed and are negative.      Objective:   Physical Exam  Constitutional: She is oriented to person, place, and time. She appears well-developed and well-nourished. No distress.  HENT:  Head: Normocephalic and atraumatic.  Right Ear: External ear normal.  Left Ear: External ear normal.  Nose: Nose normal.  Mouth/Throat: Oropharynx is clear and moist. No oropharyngeal exudate.  Eyes: Pupils are equal, round, and reactive to light. Conjunctivae and EOM are normal. Right eye exhibits no discharge. Left eye exhibits no discharge. No scleral icterus.  Neck: Normal range of motion. Neck supple. No JVD present. No tracheal deviation present.  Cardiovascular: Normal rate, regular rhythm and intact distal pulses.  Murmur heard. Pulmonary/Chest: Effort normal. No stridor. No respiratory distress. She has wheezes. She has rales. She exhibits no tenderness.  Abdominal: Soft. Bowel sounds are normal. She exhibits no mass. There is no tenderness. There is no rebound and no guarding.  Genitourinary: Vagina normal and uterus normal. Uterus is not enlarged. Cervix exhibits no motion tenderness. Right adnexum displays no mass. Left adnexum displays no mass. No vaginal discharge found.  Musculoskeletal: Normal range of motion. She exhibits no edema, tenderness or deformity.  Lymphadenopathy:    She has no cervical adenopathy.  Neurological: She is alert and oriented to person, place, and time. She has normal reflexes. No cranial nerve deficit. She exhibits normal muscle tone. Coordination normal.  Skin: Skin is warm. No rash noted. She is not diaphoretic. No erythema. No pallor.  Psychiatric: She has a normal mood and affect. Her behavior is normal. Judgment and thought content normal.  Vitals reviewed.         Assessment & Plan:  Essential hypertension  Epigastric pain  I am very happy with her blood pressure today.  I  would make no changes in the amlodipine.  I would continue to monitor this clinically.  I counseled the patient on the importance of smoking cessation.  She obviously has asthma or some underlying obstructive lung disease and I explained to the patient the continued smoking is only going to make this worse.  At the present time she requires no other medication however.  Regarding her epigastric abdominal pain, this is essentially resolved.  She is had a few episodes of diarrhea last week that I think may be due to the antibiotics.  The next step would be a GI consultation for EGD if the epigastric abdominal pain persist.  At the present time she declines this.  She can notify me if the pain worsens-returns

## 2018-10-04 ENCOUNTER — Telehealth: Payer: Self-pay

## 2018-10-04 NOTE — Telephone Encounter (Signed)
Returned patient's call regarding left leg pain and bilateral leg swelling. Pt. States the pain started gradual and is getting increasingly worse, she rates it at 8-9/10. States low back is now starting to hurt. She states both legs swell to the point where she can not see her knees/ankles. Elevating her leg and Advil do not help her pain. Appt. made for her with APP Monday 11/18 at 1:15. Told pt. To arrive 15 minutes early.

## 2018-10-10 ENCOUNTER — Ambulatory Visit: Payer: Medicare Other | Admitting: Family Medicine

## 2018-10-10 ENCOUNTER — Ambulatory Visit: Payer: Medicare Other | Admitting: Family

## 2018-10-10 ENCOUNTER — Encounter: Payer: Self-pay | Admitting: Family

## 2018-10-10 VITALS — BP 128/67 | HR 106 | Temp 97.1°F | Resp 16 | Ht <= 58 in | Wt 146.0 lb

## 2018-10-10 DIAGNOSIS — I7409 Other arterial embolism and thrombosis of abdominal aorta: Secondary | ICD-10-CM | POA: Diagnosis not present

## 2018-10-10 DIAGNOSIS — Z95828 Presence of other vascular implants and grafts: Secondary | ICD-10-CM

## 2018-10-10 DIAGNOSIS — F17200 Nicotine dependence, unspecified, uncomplicated: Secondary | ICD-10-CM | POA: Diagnosis not present

## 2018-10-10 NOTE — Patient Instructions (Addendum)
To decrease swelling in your feet and legs: Elevate feet above slightly bent knees, feet above heart, overnight and 3-4 times per day for 20 minutes.  To measure for knee high compression hose: Measure the length of calf (from the crease of the knee to the bottom of the heel), largest circumference of calf, and ankle circumference first thing in the morning before your legs have a chance to swell.  Take these 3 measurements with you to obtain 20-30 mm mercury graduated knee high compression hose.  Put the stockings on in the morning, remove at bedtime.     Steps to Quit Smoking Smoking tobacco can be bad for your health. It can also affect almost every organ in your body. Smoking puts you and people around you at risk for many serious long-lasting (chronic) diseases. Quitting smoking is hard, but it is one of the best things that you can do for your health. It is never too late to quit. What are the benefits of quitting smoking? When you quit smoking, you lower your risk for getting serious diseases and conditions. They can include:  Lung cancer or lung disease.  Heart disease.  Stroke.  Heart attack.  Not being able to have children (infertility).  Weak bones (osteoporosis) and broken bones (fractures).  If you have coughing, wheezing, and shortness of breath, those symptoms may get better when you quit. You may also get sick less often. If you are pregnant, quitting smoking can help to lower your chances of having a baby of low birth weight. What can I do to help me quit smoking? Talk with your doctor about what can help you quit smoking. Some things you can do (strategies) include:  Quitting smoking totally, instead of slowly cutting back how much you smoke over a period of time.  Going to in-person counseling. You are more likely to quit if you go to many counseling sessions.  Using resources and support systems, such as: ? Agricultural engineernline chats with a Veterinary surgeoncounselor. ? Phone  quitlines. ? Automotive engineerrinted self-help materials. ? Support groups or group counseling. ? Text messaging programs. ? Mobile phone apps or applications.  Taking medicines. Some of these medicines may have nicotine in them. If you are pregnant or breastfeeding, do not take any medicines to quit smoking unless your doctor says it is okay. Talk with your doctor about counseling or other things that can help you.  Talk with your doctor about using more than one strategy at the same time, such as taking medicines while you are also going to in-person counseling. This can help make quitting easier. What things can I do to make it easier to quit? Quitting smoking might feel very hard at first, but there is a lot that you can do to make it easier. Take these steps:  Talk to your family and friends. Ask them to support and encourage you.  Call phone quitlines, reach out to support groups, or work with a Veterinary surgeoncounselor.  Ask people who smoke to not smoke around you.  Avoid places that make you want (trigger) to smoke, such as: ? Bars. ? Parties. ? Smoke-break areas at work.  Spend time with people who do not smoke.  Lower the stress in your life. Stress can make you want to smoke. Try these things to help your stress: ? Getting regular exercise. ? Deep-breathing exercises. ? Yoga. ? Meditating. ? Doing a body scan. To do this, close your eyes, focus on one area of your body at a  time from head to toe, and notice which parts of your body are tense. Try to relax the muscles in those areas.  Download or buy apps on your mobile phone or tablet that can help you stick to your quit plan. There are many free apps, such as QuitGuide from the [employer] developer for Disease Control and Prevention). You can find more support from smokefree.gov and other websites.  This information is not intended to replace advice given to you by your health care provider. Make sure you discuss any questions you have with your health care  provider. Document Released: 09/05/2009 Document Revised: 07/07/2016 Document Reviewed: 03/26/2015 Elsevier Interactive Patient Education  2018 Elsevier Inc.     Peripheral Vascular Disease Peripheral vascular disease (PVD) is a disease of the blood vessels that are not part of your heart and brain. A simple term for PVD is poor circulation. In most cases, PVD narrows the blood vessels that carry blood from your heart to the rest of your body. This can result in a decreased supply of blood to your arms, legs, and internal organs, like your stomach or kidneys. However, it most often affects a person's lower legs and feet. There are two types of PVD.  Organic PVD. This is the more common type. It is caused by damage to the structure of blood vessels.  Functional PVD. This is caused by conditions that make blood vessels contract and tighten (spasm).  Without treatment, PVD tends to get worse over time. PVD can also lead to acute ischemic limb. This is when an arm or limb suddenly has trouble getting enough blood. This is a medical emergency. Follow these instructions at home:  Take medicines only as told by your doctor.  Do not use any tobacco products, including cigarettes, chewing tobacco, or electronic cigarettes. If you need help quitting, ask your doctor.  Lose weight if you are overweight, and maintain a healthy weight as told by your doctor.  Eat a diet that is low in fat and cholesterol. If you need help, ask your doctor.  Exercise regularly. Ask your doctor for some good activities for you.  Take good care of your feet. ? Wear comfortable shoes that fit well. ? Check your feet often for any cuts or sores. Contact a doctor if:  You have cramps in your legs while walking.  You have leg pain when you are at rest.  You have coldness in a leg or foot.  Your skin changes.  You are unable to get or have an erection (erectile dysfunction).  You have cuts or sores on your  feet that are not healing. Get help right away if:  Your arm or leg turns cold and blue.  Your arms or legs become red, warm, swollen, painful, or numb.  You have chest pain or trouble breathing.  You suddenly have weakness in your face, arm, or leg.  You become very confused or you cannot speak.  You suddenly have a very bad headache.  You suddenly cannot see. This information is not intended to replace advice given to you by your health care provider. Make sure you discuss any questions you have with your health care provider. Document Released: 02/03/2010 Document Revised: 04/16/2016 Document Reviewed: 04/19/2014 Elsevier Interactive Patient Education  2017 [employer].

## 2018-10-10 NOTE — Progress Notes (Signed)
[facility] [place]   CC: Follow up peripheral artery occlusive disease  History of Present Illness Felicia [name] is a 52 y.o. female who is status post aorto bifemoral bypass graft on 10-23-14 by Dr. Edilia Bo for aortoiliac occlusive disease.   Dr. Edilia Bo last evaluated pt on 12-25-15. At that time lower extremity arterial Doppler study that was done on 12/18/2015 was reviewed. On the right side she had a triphasic posterior tibial signal and a biphasic dorsalis pedis signal. ABI was 86%. On the left side she had a triphasic posterior tibial signal and a biphasic dorsalis pedis signal, ABI was 84%. The patient was doing well status post aortobifemoral bypass graft. She was walking up to 5 miles a day. She had started smoking again but just quit again.  Dr. Edilia Bo encouraged her to stay as active as possible and try to stay off cigarettes completely, and advised pt to follow up with ABIs in 18 months and clinical evaluation at that time.   She has what seems like arthritis pain in both hips, wakes up with pain in hips, which resolves after she walks for a while; bad weather tends to worsen pain in hips and her shoulders.  She was taking meloxicam, but was advised to stop due to elevated blood pressure.   She has had 6 surgeries on her right shoulder and one on her left shoulder. Her right arm strength is weak due to this.    Pt denies any history of stroke or TIA. She has a right carotid bruit, and states she has had carotid artery duplex years ago.   Pt returns today with c/o left leg pain that started about 2 weeks ago and bilateral lower leg swelling. Pt. States the pain started gradual and is getting increasingly worse.States low back is now starting to hurt.   Pt saw her PCP, Dr. Tanya Nones, on 09-15-18 for follow up after a visit to the ED for abdominal pain. Pt requested hydrocodone at that visit. She had not been taking her Protonix, but states she is now. Dr.  Tanya Nones offered pt a GI consult but pt declined (review of records).    Diabetic: No Tobacco use: smoker  (1/5 pp/day, started at age 66 yrs)  Pt meds include: Statin :Yes Betablocker: No ASA: Yes Other anticoagulants/antiplatelets: no    Past Medical History:  Diagnosis Date  . Allergy    year around  . Anemia   . Anxiety   . Arthritis   . Asthma   . Atrial fibrillation (HCC)   . Bipolar 1 disorder (HCC)   . Depression   . Ectopic pregnancy   . Headache   . Heart murmur   . Hyperlipidemia   . Insomnia   . Peripheral arterial disease (HCC)   . Pneumonia   . PUD (peptic ulcer disease)   . Right knee pain 12-05-2015   per pt.  she fell out of her husband's truck and landed on her right knee  . Shortness of breath dyspnea     Social History Social History   Tobacco Use  . Smoking status: Current Every Day Smoker    Packs/day: 0.25    Years: 30.00    Pack years: 7.50    Types: Cigarettes  . Smokeless tobacco: Never Used  Substance Use Topics  . Alcohol use: Yes    Alcohol/week: 1.0 standard drinks    Types: 1 Shots of liquor per week    Comment: very occassionally on social  occassions  . Drug use: No    Family History Family History  Adopted: Yes  Problem Relation Age of Onset  . Heart disease Mother   . Diabetes Mother   . Hyperlipidemia Mother   . Hypertension Mother   . Heart attack Mother   . Heart disease Father   . Diabetes Father   . Heart attack Father   . Hypertension Father   . Bladder Cancer Father   . Diabetes Brother   . Lung cancer Paternal Grandfather   . Colon cancer Neg Hx   . Colon polyps Neg Hx   . Rectal cancer Neg Hx   . Stomach cancer Neg Hx     Past Surgical History:  Procedure Laterality Date  . ABDOMINAL AORTAGRAM N/A 09/12/2012   Procedure: ABDOMINAL AORTAGRAM;  Surgeon: Fransisco HertzBrian L Chen, MD;  Location: [facility] CATH [facility];  Service: Cardiovascular;  Laterality: N/A;  . AORTA - BILATERAL FEMORAL ARTERY BYPASS GRAFT N/A  10/23/2014   Procedure: AORTOBIFEMORAL BYPASS GRAFT;  Surgeon: Chuck Hinthristopher S Dickson, MD;  Location: [facility] OR;  Service: Vascular;  Laterality: N/A;  . BACK SURGERY    . CESAREAN SECTION    . COLONOSCOPY  01-14-2004   > 11 yr ago Dr Loreta AveMann - normal - report in epic   . DILATATION & CURETTAGE/HYSTEROSCOPY WITH MYOSURE N/A 10/15/2015   Procedure: DILATATION & CURETTAGE/HYSTEROSCOPY WITH MYOSURE;  Surgeon: Ok EdwardsJuan H Fernandez, MD;  Location: [facility] ORS;  Service: Gynecology;  Laterality: N/A;  . ESOPHAGOGASTRODUODENOSCOPY  01-14-2004   gastritis- dr Loreta Avemann- in epic already   . NOSE SURGERY    . NOSE SURGERY    . SHOULDER SURGERY     rotator cuff repair   . TONSILLECTOMY    . TUBAL LIGATION    . UPPER GASTROINTESTINAL ENDOSCOPY  01-14-2004   in epic- dr Loreta Avemann- gastritis     Allergies  Allergen Reactions  . Augmentin [Amoxicillin-Pot Clavulanate] Diarrhea  . Bactrim [Sulfamethoxazole-Trimethoprim] Hives  . Bee Pollen Swelling    Also ticks   . Cephalosporins Hives  . Codeine Nausea Only  . Doxycycline Hives  . Tetracyclines & Related Hives  . Reflex Pain Relief [Menthol] Rash    Muscle Rub    Current Outpatient Medications  Medication Sig Dispense Refill  . amLODipine (NORVASC) 10 MG tablet Take 1 tablet (10 mg total) by mouth daily. 90 tablet 3  . aspirin 325 MG tablet Take 325 mg by mouth daily.    Marland Kitchen. atorvastatin (LIPITOR) 80 MG tablet TAKE 1 TABLET (80 MG TOTAL) BY MOUTH DAILY. 90 tablet 3  . beclomethasone (QVAR) 80 MCG/ACT inhaler Inhale 2 puffs into the lungs 2 (two) times daily.    . clonazePAM (KLONOPIN) 1 MG tablet TAKE 1 TABLET BY MOUTH 3 TIMES A DAY 90 tablet 2  . cyclobenzaprine (FLEXERIL) 10 MG tablet TAKE 1 TABLET BY MOUTH 3 TIMES A DAY AS NEEDED FOR MUSCLE SPASMS 90 tablet 1  . dicyclomine (BENTYL) 20 MG tablet TAKE 1 TABLET (20 MG TOTAL) BY MOUTH 3 (THREE) TIMES DAILY WITH MEALS AS NEEDED FOR SPASMS. 20 tablet 0  . diphenhydrAMINE (BENADRYL) 50 MG capsule Take 50 mg by mouth every  6 (six) hours as needed for itching.    Marland Kitchen. EPIPEN 2-PAK 0.3 MG/0.3ML SOAJ injection USE AS DIRECTED 2 Device 0  . hydrochlorothiazide (MICROZIDE) 12.5 MG capsule Take 1 capsule (12.5 mg total) by mouth daily. 90 capsule 3  . losartan (COZAAR) 50 MG tablet Please specify directions, refills and quantity  90 tablet 3  . meloxicam (MOBIC) 15 MG tablet Take 1 tablet (15 mg total) by mouth daily. 90 tablet 0  . ondansetron (ZOFRAN) 4 MG tablet Take 1 tablet (4 mg total) by mouth every 8 (eight) hours as needed for nausea or vomiting. 20 tablet 0  . pantoprazole (PROTONIX) 40 MG tablet Take 1 tablet (40 mg total) by mouth daily. 30 tablet 3  . traMADol (ULTRAM) 50 MG tablet TAKE 2 TABLETS BY MOUTH TWICE A DAY AS NEEDED FOR PAIN 120 tablet 1  . azithromycin (ZITHROMAX) 250 MG tablet 2 tabs poqday1, 1 tab poqday 2-5 (Patient not taking: Reported on 10/10/2018) 6 tablet 0   No current facility-administered medications for this visit.     ROS: See HPI for pertinent positives and negatives.   Physical Examination  Vitals:   10/10/18 1306  BP: 128/67  Pulse: (!) 106  Resp: 16  Temp: (!) 97.1 F (36.2 C)  TempSrc: Oral  SpO2: 95%  Weight: 146 lb (66.2 kg)  Height: 4\' 9"  (1.448 m)   Body mass index is 31.59 kg/m.  General: A&O x 3, WDWN, obese female. Gait: normal Eyes: Pupils are moderately dilated, equal, constrict to light. Pulmonary: Respirations are slightly labored, fair air movement in all fields. Wheezes present in all fields which cleared with cough, no rales or rhonchi.  Cardiac: regular rhythm, tachycardic at 106, no detected murmur.         Carotid Bruits Right Left   Positive Negative   Radial pulses are 2+ palpable bilaterally   Adominal aortic pulse is not palpable                         VASCULAR EXAM: Extremities without ischemic changes, without Gangrene; without open wounds. Bilateral pretibial pitting edema: trace right, 1+ left.                                                                                                                                                        LE Pulses Right Left       FEMORAL  2+ palpable  2+ palpable        POPLITEAL  not palpable   not palpable       POSTERIOR TIBIAL  not palpable   1+ palpable        DORSALIS PEDIS      ANTERIOR TIBIAL 2+ palpable  not palpable    Abdomen: soft, NT, subxiphoid incisional abdominal hernia.  Skin: no rashes, no ulcers noted. Musculoskeletal: no muscle wasting or atrophy. Mild swelling at dorsum of left foot since twisting her ankle in March 2018.  Neurologic: A&O X 3; Appropriate Affect ; SENSATION: normal; MOTOR FUNCTION:  moving all extremities equally, motor strength 5/5 throughout except 3/5 in right UE. Speech is  fluent/normal. CN 2-12 intact Psychiatric: Thought content is normal, mood appropriate for clinical situation.     ASSESSMENT: Felicia [name] is a 52 y.o. female who is status post aorto bifemoral bypass graft on 10-23-14 by Dr. Edilia Bo for aortoiliac occlusive disease. She no longer has claudication symptoms with walking, but seems to have bilateral OA hip pain, low back pain; and more recently, left groin painin the last 3 weeks.   Pt has a right carotid bruit, I requested carotid duplex in a couple weeks when I saw pt on 06-23-17,  however, pt did not return for carotid duplex. She has no known hx of stroke or TIA. Carotid duplex from 2012 at [facility] showed <50% bilateral ICA stenosis.   Fortunately she does not have DM and stays physically active. Unfortunately she continues to smoke, but decreased to about 1/5 ppd, started at age 34.  See Plan.   DATA  ABI (Date: 06/23/2017):  R:  ? ABI: 1.01 (was 0.86 on 12-18-15),  ? PT: tri ? DP: tri ? TBI:  0.71  L:  ? ABI: 0.99 (was 0.84),  ? PT: tri ? DP: tri ? TBI: 0.80   Carotid Duplex (06-19-2011 at [facility]); RIGHT CAROTID ARTERY: Mild diffuse intimal  thickening. Mild calcified plaque in the carotid bulb and proximal ICA. No significant stenosis, less than 50%. RIGHT VERTEBRAL ARTERY: Antegrade flow. LEFT CAROTID ARTERY: Mild diffuse intimal thickening. Mild calcific plaque in the carotid bulb and proximal ICA. No significant stenosis, less 50%. LEFT VERTEBRAL ARTERY: Antegrade flow.   PLAN:  Based on the patient's vascular studies and examination, pt will return to clinic in 1 month with carotid duplex and ABI's.  The patient was counseled re smoking cessation and given several free resources re smoking cessation. Continue extensive walking    - Trace and 1+ pitting edema both lower legs: legs elevation, knee high compression hose.  - Left groin pain: does not seem related to arterial perfusion, bilateral femoral pulses are 2+ palpable, left PT pulse is palpable.  She has known back problems and arthritis.   To decrease swelling in your feet and legs: Elevate feet above slightly bent knees, feet above heart, overnight and 3-4 times per day for 20 minutes.  I discussed in depth with the patient the nature of atherosclerosis, and emphasized the importance of maximal medical management including strict control of blood pressure, blood glucose, and lipid levels, obtaining regular exercise, and cessation of smoking.  The patient is aware that without maximal medical management the underlying atherosclerotic disease process will progress, limiting the benefit of any interventions.  The patient was given information about PAD including signs, symptoms, treatment, what symptoms should prompt the patient to seek immediate medical care, and risk reduction measures to take.  Charisse March, RN, MSN, FNP-C [facility] [place] Phone: [phone]  Clinic MD: Myra Gianotti  10/10/18 1:15 PM

## 2018-10-11 ENCOUNTER — Other Ambulatory Visit: Payer: Self-pay | Admitting: Family Medicine

## 2018-10-12 NOTE — Telephone Encounter (Signed)
Requesting refill    Klonopin  LOV: 09/15/18  LRF:  07/18/18

## 2018-10-14 ENCOUNTER — Other Ambulatory Visit: Payer: Self-pay

## 2018-10-14 DIAGNOSIS — I7409 Other arterial embolism and thrombosis of abdominal aorta: Secondary | ICD-10-CM

## 2018-10-14 DIAGNOSIS — I6529 Occlusion and stenosis of unspecified carotid artery: Secondary | ICD-10-CM

## 2018-10-14 DIAGNOSIS — Z95828 Presence of other vascular implants and grafts: Secondary | ICD-10-CM

## 2018-10-24 ENCOUNTER — Other Ambulatory Visit: Payer: Self-pay | Admitting: Family Medicine

## 2018-10-24 NOTE — Telephone Encounter (Signed)
Requesting refill  Tramadol    LOV: 09/15/18   LRF: 08/29/18

## 2018-11-04 ENCOUNTER — Other Ambulatory Visit: Payer: Self-pay | Admitting: Family Medicine

## 2018-11-04 DIAGNOSIS — R1013 Epigastric pain: Secondary | ICD-10-CM

## 2018-11-11 ENCOUNTER — Other Ambulatory Visit: Payer: Self-pay | Admitting: Family Medicine

## 2018-12-08 ENCOUNTER — Other Ambulatory Visit: Payer: Self-pay

## 2018-12-08 ENCOUNTER — Ambulatory Visit (HOSPITAL_COMMUNITY)
Admission: RE | Admit: 2018-12-08 | Discharge: 2018-12-08 | Disposition: A | Discharge status: Home / Self Care | Payer: Medicare Other | Source: Ambulatory Visit | Attending: Family | Admitting: Family

## 2018-12-08 ENCOUNTER — Ambulatory Visit: Payer: Medicare Other | Admitting: Family

## 2018-12-08 ENCOUNTER — Encounter: Payer: Self-pay | Admitting: Family

## 2018-12-08 ENCOUNTER — Ambulatory Visit (INDEPENDENT_AMBULATORY_CARE_PROVIDER_SITE_OTHER)
Admission: RE | Admit: 2018-12-08 | Discharge: 2018-12-08 | Disposition: A | Discharge status: Home / Self Care | Payer: Medicare Other | Source: Ambulatory Visit | Attending: Family | Admitting: Family

## 2018-12-08 VITALS — BP 131/65 | HR 91 | Temp 97.4°F | Resp 16 | Ht <= 58 in | Wt 142.0 lb

## 2018-12-08 DIAGNOSIS — Z95828 Presence of other vascular implants and grafts: Secondary | ICD-10-CM | POA: Insufficient documentation

## 2018-12-08 DIAGNOSIS — I6529 Occlusion and stenosis of unspecified carotid artery: Secondary | ICD-10-CM | POA: Diagnosis present

## 2018-12-08 DIAGNOSIS — I7409 Other arterial embolism and thrombosis of abdominal aorta: Secondary | ICD-10-CM | POA: Insufficient documentation

## 2018-12-08 DIAGNOSIS — R0989 Other specified symptoms and signs involving the circulatory and respiratory systems: Secondary | ICD-10-CM | POA: Diagnosis not present

## 2018-12-08 NOTE — Patient Instructions (Signed)
Steps to Quit Smoking ° °Smoking tobacco can be bad for your health. It can also affect almost every organ in your body. Smoking puts you and people around you at risk for many serious long-lasting (chronic) diseases. Quitting smoking is hard, but it is one of the best things that you can do for your health. It is never too late to quit. °What are the benefits of quitting smoking? °When you quit smoking, you lower your risk for getting serious diseases and conditions. They can include: °· Lung cancer or lung disease. °· Heart disease. °· Stroke. °· Heart attack. °· Not being able to have children (infertility). °· Weak bones (osteoporosis) and broken bones (fractures). °If you have coughing, wheezing, and shortness of breath, those symptoms may get better when you quit. You may also get sick less often. If you are pregnant, quitting smoking can help to lower your chances of having a baby of low birth weight. °What can I do to help me quit smoking? °Talk with your doctor about what can help you quit smoking. Some things you can do (strategies) include: °· Quitting smoking totally, instead of slowly cutting back how much you smoke over a period of time. °· Going to in-person counseling. You are more likely to quit if you go to many counseling sessions. °· Using resources and support systems, such as: °? Online chats with a counselor. °? Phone quitlines. °? Printed self-help materials. °? Support groups or group counseling. °? Text messaging programs. °? Mobile phone apps or applications. °· Taking medicines. Some of these medicines may have nicotine in them. If you are pregnant or breastfeeding, do not take any medicines to quit smoking unless your doctor says it is okay. Talk with your doctor about counseling or other things that can help you. °Talk with your doctor about using more than one strategy at the same time, such as taking medicines while you are also going to in-person counseling. This can help make  quitting easier. °What things can I do to make it easier to quit? °Quitting smoking might feel very hard at first, but there is a lot that you can do to make it easier. Take these steps: °· Talk to your family and friends. Ask them to support and encourage you. °· Call phone quitlines, reach out to support groups, or work with a counselor. °· Ask people who smoke to not smoke around you. °· Avoid places that make you want (trigger) to smoke, such as: °? Bars. °? Parties. °? Smoke-break areas at work. °· Spend time with people who do not smoke. °· Lower the stress in your life. Stress can make you want to smoke. Try these things to help your stress: °? Getting regular exercise. °? Deep-breathing exercises. °? Yoga. °? Meditating. °? Doing a body scan. To do this, close your eyes, focus on one area of your body at a time from head to toe, and notice which parts of your body are tense. Try to relax the muscles in those areas. °· Download or buy apps on your mobile phone or tablet that can help you stick to your quit plan. There are many free apps, such as QuitGuide from the CDC (Centers for Disease Control and Prevention). You can find more support from smokefree.gov and other websites. °This information is not intended to replace advice given to you by your health care provider. Make sure you discuss any questions you have with your health care provider. °Document Released: 09/05/2009 Document Revised: 07/07/2016   Document Reviewed: 03/26/2015 °Elsevier Interactive Patient Education © 2019 Elsevier Inc. ° ° ° ° °Peripheral Vascular Disease ° °Peripheral vascular disease (PVD) is a disease of the blood vessels that are not part of your heart and brain. A simple term for PVD is poor circulation. In most cases, PVD narrows the blood vessels that carry blood from your heart to the rest of your body. This can reduce the supply of blood to your arms, legs, and internal organs, like your stomach or kidneys. However, PVD most  often affects a person’s lower legs and feet. Without treatment, PVD tends to get worse. °PVD can also lead to acute ischemic limb. This is when an arm or leg suddenly cannot get enough blood. This is a medical emergency. °Follow these instructions at home: °Lifestyle °· Do not use any products that contain nicotine or tobacco, such as cigarettes and e-cigarettes. If you need help quitting, ask your doctor. °· Lose weight if you are overweight. Or, stay at a healthy weight as told by your doctor. °· Eat a diet that is low in fat and cholesterol. If you need help, ask your doctor. °· Exercise regularly. Ask your doctor for activities that are right for you. °General instructions °· Take over-the-counter and prescription medicines only as told by your doctor. °· Take good care of your feet: °? Wear comfortable shoes that fit well. °? Check your feet often for any cuts or sores. °· Keep all follow-up visits as told by your doctor This is important. °Contact a doctor if: °· You have cramps in your legs when you walk. °· You have leg pain when you are at rest. °· You have coldness in a leg or foot. °· Your skin changes. °· You are unable to get or have an erection (erectile dysfunction). °· You have cuts or sores on your feet that do not heal. °Get help right away if: °· Your arm or leg turns cold, numb, and blue. °· Your arms or legs become red, warm, swollen, painful, or numb. °· You have chest pain. °· You have trouble breathing. °· You suddenly have weakness in your face, arm, or leg. °· You become very confused or you cannot speak. °· You suddenly have a very bad headache. °· You suddenly cannot see. °Summary °· Peripheral vascular disease (PVD) is a disease of the blood vessels. °· A simple term for PVD is poor circulation. Without treatment, PVD tends to get worse. °· Treatment may include exercise, low fat and low cholesterol diet, and quitting smoking. °This information is not intended to replace advice given to  you by your health care provider. Make sure you discuss any questions you have with your health care provider. °Document Released: 02/03/2010 Document Revised: 12/17/2016 Document Reviewed: 12/17/2016 °Elsevier Interactive Patient Education © 2019 Elsevier Inc. ° ° ° ° °Stroke Prevention °Some medical conditions and lifestyle choices can lead to a higher risk for a stroke. You can help to prevent a stroke by making nutrition, lifestyle, and other changes. °What nutrition changes can be made? ° °· Eat healthy foods. °? Choose foods that are high in fiber. These include: °§ Fresh fruits. °§ Fresh vegetables. °§ Whole grains. °? Eat at least 5 or more servings of fruits and vegetables each day. Try to fill half of your plate at each meal with fruits and vegetables. °? Choose lean protein foods. These include: °§ Lowfat (lean) cuts of meat. °§ Chicken without skin. °§ Fish. °§ Tofu. °§ Beans. °§ Nuts. °?   Eat low-fat dairy products. °? Avoid foods that: °§ Are high in salt (sodium). °§ Have saturated fat. °§ Have trans fat. °§ Have cholesterol. °§ Are processed. °§ Are premade. °· Follow eating guidelines as told by your doctor. These may include: °? Reducing how many calories you eat and drink each day. °? Limiting how much salt you eat or drink each day to 1,500 milligrams (mg). °? Using only healthy fats for cooking. These include: °§ Olive oil. °§ Canola oil. °§ Sunflower oil. °? Counting how many carbohydrates you eat and drink each day. °What lifestyle changes can be made? °· Try to stay at a healthy weight. Talk to your doctor about what a good weight is for you. °· Get at least 30 minutes of moderate physical activity at least 5 days a week. This can include: °? Fast walking. °? Biking. °? Swimming. °· Do not use any products that have nicotine or tobacco. This includes cigarettes and e-cigarettes. If you need help quitting, ask your doctor. Avoid being around tobacco smoke in general. °· Limit how much alcohol  you drink to no more than 1 drink a day for nonpregnant women and 2 drinks a day for men. One drink equals 12 oz of beer, 5 oz of wine, or 1½ oz of hard liquor. °· Do not use drugs. °· Avoid taking birth control pills. Talk to your doctor about the risks of taking birth control pills if: °? You are over 35 years old. °? You smoke. °? You get migraines. °? You have had a blood clot. °What other changes can be made? °· Manage your cholesterol. °? It is important to eat a healthy diet. °? If your cholesterol cannot be managed through your diet, you may also need to take medicines. Take medicines as told by your doctor. °· Manage your diabetes. °? It is important to eat a healthy diet and to exercise regularly. °? If your blood sugar cannot be managed through diet and exercise, you may need to take medicines. Take medicines as told by your doctor. °· Control your high blood pressure (hypertension). °? Try to keep your blood pressure below 130/80. This can help lower your risk of stroke. °? It is important to eat a healthy diet and to exercise regularly. °? If your blood pressure cannot be managed through diet and exercise, you may need to take medicines. Take medicines as told by your doctor. °? Ask your doctor if you should check your blood pressure at home. °? Have your blood pressure checked every year. Do this even if your blood pressure is normal. °· Talk to your doctor about getting checked for a sleep disorder. Signs of this can include: °? Snoring a lot. °? Feeling very tired. °· Take over-the-counter and prescription medicines only as told by your doctor. These may include aspirin or blood thinners (antiplatelets or anticoagulants). °· Make sure that any other medical conditions you have are managed. °Where to find more information °· American Stroke Association: www.strokeassociation.org °· National Stroke Association: www.stroke.org °Get help right away if: °· You have any symptoms of stroke. "BE FAST" is an  easy way to remember the main warning signs: °? B - Balance. Signs are dizziness, sudden trouble walking, or loss of balance. °? E - Eyes. Signs are trouble seeing or a sudden change in how you see. °? F - Face. Signs are sudden weakness or loss of feeling of the face, or the face or eyelid drooping on one side. °?   A - Arms. Signs are weakness or loss of feeling in an arm. This happens suddenly and usually on one side of the body. °? S - Speech. Signs are sudden trouble speaking, slurred speech, or trouble understanding what people say. °? T - Time. Time to call emergency services. Write down what time symptoms started. °· You have other signs of stroke, such as: °? A sudden, very bad headache with no known cause. °? Feeling sick to your stomach (nausea). °? Throwing up (vomiting). °? Jerky movements you cannot control (seizure). °These symptoms may represent a serious problem that is an emergency. Do not wait to see if the symptoms will go away. Get medical help right away. Call your local emergency services (911 in the [place] Do not drive yourself to the hospital. °Summary °· You can prevent a stroke by eating healthy, exercising, not smoking, drinking less alcohol, and treating other health problems, such as diabetes, high blood pressure, or high cholesterol. °· Do not use any products that contain nicotine or tobacco, such as cigarettes and e-cigarettes. °· Get help right away if you have any signs or symptoms of a stroke. °This information is not intended to replace advice given to you by your health care provider. Make sure you discuss any questions you have with your health care provider. °Document Released: 05/10/2012 Document Revised: 02/10/2017 Document Reviewed: 02/10/2017 °Elsevier Interactive Patient Education © 2019 Elsevier Inc. ° °

## 2018-12-08 NOTE — Progress Notes (Signed)
[facility]  HISTORY AND PHYSICAL   CC: Follow up PAOD and extracranial carotid artery stenosis    History of Present Illness:   Felicia [name] is a 53 y.o. female whois status post aorto bifemoral bypass grafton 10-23-14 by Dr. Edilia Bo for aortoiliac occlusive disease.   Dr. Edilia Bo last evaluated pt on 12-25-15. At that timelower extremity arterial Doppler study that was done on 01/25/2017was reviewed. On the right side she hada triphasic posterior tibial signal and a biphasic dorsalis pedis signal. ABI was 86%. On the left side she hada triphasic posterior tibial signal and a biphasic dorsalis pedis signal,ABI was 84%. The patientwas doing well status post aortobifemoral bypass graft. Shewas walking up to 5 miles a day. She hadstarted smoking again but just quit again.  Dr. Dicksonencouraged her to stay as active as possible and try to stay off cigarettes completely, and advised pt tofollow upwithABIs in 18 months andclinical evaluation at that time.   She has what seems like arthritis pain in both hips, wakes up with pain in hips, which resolves after she walks for a while; bad weather tends to worsen pain in hips and her shoulders.  She was taking meloxicam, but was advised to stop due to elevated blood pressure.   She has had 6 surgeries on her right shoulder and one on her left shoulder. Her right arm strength is weak due to this.   Pt denies any history of stroke or TIA. She has a right carotid bruit.  She reports lumbar spine surgery in the past, she also reports right sciatic type pain that occasionally wakes her up at night  She has a hx of gastric ulcer, seems resolved since she took a course of antibx and combination of meds to address GI ulcers.  She reports 2 mos hx of RUQ abd pain, and worsening pain from proximal abdominal incisional hernia; she had an abdominal US in October 2019, hepatic steatosis noted.    Diabetic:  No Tobacco use: smoker  (6 cigs/day, started at age 3 yrs)  Pt meds include: Statin :Yes Betablocker: No ASA: Yes Other anticoagulants/antiplatelets: no  Current Outpatient Medications  Medication Sig Dispense Refill  . amLODipine (NORVASC) 10 MG tablet Take 1 tablet (10 mg total) by mouth daily. 90 tablet 3  . aspirin 325 MG tablet Take 325 mg by mouth daily.    Marland Kitchen atorvastatin (LIPITOR) 80 MG tablet TAKE 1 TABLET (80 MG TOTAL) BY MOUTH DAILY. 90 tablet 3  . beclomethasone (QVAR) 80 MCG/ACT inhaler Inhale 2 puffs into the lungs 2 (two) times daily.    . clonazePAM (KLONOPIN) 1 MG tablet TAKE 1 TABLET BY MOUTH THREE TIMES A DAY 90 tablet 2  . cyclobenzaprine (FLEXERIL) 10 MG tablet TAKE 1 TABLET BY MOUTH THREE TIMES DAILY AS NEEDED FOR MUSCLE SPASMS 90 tablet 1  . dicyclomine (BENTYL) 20 MG tablet TAKE 1 TABLET (20 MG TOTAL) BY MOUTH 3 (THREE) TIMES DAILY WITH MEALS AS NEEDED FOR SPASMS. 20 tablet 0  . diphenhydrAMINE (BENADRYL) 50 MG capsule Take 50 mg by mouth every 6 (six) hours as needed for itching.    Marland Kitchen EPIPEN 2-PAK 0.3 MG/0.3ML SOAJ injection USE AS DIRECTED 2 Device 0  . hydrochlorothiazide (MICROZIDE) 12.5 MG capsule Take 1 capsule (12.5 mg total) by mouth daily. 90 capsule 3  . losartan (COZAAR) 50 MG tablet Please specify directions, refills and quantity 90 tablet 3  . ondansetron (ZOFRAN) 4 MG tablet Take 1 tablet (4 mg  total) by mouth every 8 (eight) hours as needed for nausea or vomiting. 20 tablet 0  . pantoprazole (PROTONIX) 40 MG tablet TAKE 1 TABLET BY MOUTH EVERY DAY 90 tablet 1  . traMADol (ULTRAM) 50 MG tablet TAKE 2 TABLETS BY MOUTH TWICE A DAY AS NEEDED FOR PAIN 120 tablet 1  . azithromycin (ZITHROMAX) 250 MG tablet 2 tabs poqday1, 1 tab poqday 2-5 (Patient not taking: Reported on 10/10/2018) 6 tablet 0  . meloxicam (MOBIC) 15 MG tablet Take 1 tablet (15 mg total) by mouth daily. (Patient not taking: Reported on 12/08/2018) 90 tablet 0   No current  facility-administered medications for this visit.     Past Medical History:  Diagnosis Date  . Allergy    year around  . Anemia   . Anxiety   . Arthritis   . Asthma   . Atrial fibrillation (HCC)   . Bipolar 1 disorder (HCC)   . Depression   . Ectopic pregnancy   . Headache   . Heart murmur   . Hyperlipidemia   . Insomnia   . Peripheral arterial disease (HCC)   . Pneumonia   . PUD (peptic ulcer disease)   . Right knee pain 12-05-2015   per pt.  she fell out of her husband's truck and landed on her right knee  . Shortness of breath dyspnea     Social History Social History   Tobacco Use  . Smoking status: Current Every Day Smoker    Packs/day: 0.25    Years: 30.00    Pack years: 7.50    Types: Cigarettes  . Smokeless tobacco: Never Used  Substance Use Topics  . Alcohol use: Yes    Alcohol/week: 1.0 standard drinks    Types: 1 Shots of liquor per week    Comment: very occassionally on social occassions  . Drug use: No    Family History Family History  Adopted: Yes  Problem Relation Age of Onset  . Heart disease Mother   . Diabetes Mother   . Hyperlipidemia Mother   . Hypertension Mother   . Heart attack Mother   . Heart disease Father   . Diabetes Father   . Heart attack Father   . Hypertension Father   . Bladder Cancer Father   . Diabetes Brother   . Lung cancer Paternal Grandfather   . Colon cancer Neg Hx   . Colon polyps Neg Hx   . Rectal cancer Neg Hx   . Stomach cancer Neg Hx     Surgical History Past Surgical History:  Procedure Laterality Date  . ABDOMINAL AORTAGRAM N/A 09/12/2012   Procedure: ABDOMINAL AORTAGRAM;  Surgeon: Fransisco HertzBrian L Chen, MD;  [facility];  Service: Cardiovascular;  Laterality: N/A;  . AORTA - BILATERAL FEMORAL ARTERY BYPASS GRAFT N/A 10/23/2014   Procedure: AORTOBIFEMORAL BYPASS GRAFT;  Surgeon: Chuck Hinthristopher S Dickson, MD;  [facility];  Service: Vascular;  Laterality: N/A;  . BACK SURGERY    . CESAREAN  SECTION    . COLONOSCOPY  01-14-2004   > 11 yr ago Dr Loreta AveMann - normal - report in epic   . DILATATION & CURETTAGE/HYSTEROSCOPY WITH MYOSURE N/A 10/15/2015   Procedure: DILATATION & CURETTAGE/HYSTEROSCOPY WITH MYOSURE;  Surgeon: Ok EdwardsJuan H Fernandez, MD;  Location: [facility] ORS;  Service: Gynecology;  Laterality: N/A;  . ESOPHAGOGASTRODUODENOSCOPY  01-14-2004   gastritis- dr Loreta Avemann- in epic already   . NOSE SURGERY    . NOSE SURGERY    . SHOULDER  SURGERY     rotator cuff repair   . TONSILLECTOMY    . TUBAL LIGATION    . UPPER GASTROINTESTINAL ENDOSCOPY  01-14-2004   in epic- dr Loreta Ave- gastritis     Allergies  Allergen Reactions  . Augmentin [Amoxicillin-Pot Clavulanate] Diarrhea  . Bactrim [Sulfamethoxazole-Trimethoprim] Hives  . Bee Pollen Swelling    Also ticks   . Cephalosporins Hives  . Codeine Nausea Only  . Doxycycline Hives  . Tetracyclines & Related Hives  . Reflex Pain Relief [Menthol] Rash    Muscle Rub    Current Outpatient Medications  Medication Sig Dispense Refill  . amLODipine (NORVASC) 10 MG tablet Take 1 tablet (10 mg total) by mouth daily. 90 tablet 3  . aspirin 325 MG tablet Take 325 mg by mouth daily.    Marland Kitchen atorvastatin (LIPITOR) 80 MG tablet TAKE 1 TABLET (80 MG TOTAL) BY MOUTH DAILY. 90 tablet 3  . beclomethasone (QVAR) 80 MCG/ACT inhaler Inhale 2 puffs into the lungs 2 (two) times daily.    . clonazePAM (KLONOPIN) 1 MG tablet TAKE 1 TABLET BY MOUTH THREE TIMES A DAY 90 tablet 2  . cyclobenzaprine (FLEXERIL) 10 MG tablet TAKE 1 TABLET BY MOUTH THREE TIMES DAILY AS NEEDED FOR MUSCLE SPASMS 90 tablet 1  . dicyclomine (BENTYL) 20 MG tablet TAKE 1 TABLET (20 MG TOTAL) BY MOUTH 3 (THREE) TIMES DAILY WITH MEALS AS NEEDED FOR SPASMS. 20 tablet 0  . diphenhydrAMINE (BENADRYL) 50 MG capsule Take 50 mg by mouth every 6 (six) hours as needed for itching.    Marland Kitchen EPIPEN 2-PAK 0.3 MG/0.3ML SOAJ injection USE AS DIRECTED 2 Device 0  . hydrochlorothiazide (MICROZIDE) 12.5 MG capsule Take 1  capsule (12.5 mg total) by mouth daily. 90 capsule 3  . losartan (COZAAR) 50 MG tablet Please specify directions, refills and quantity 90 tablet 3  . ondansetron (ZOFRAN) 4 MG tablet Take 1 tablet (4 mg total) by mouth every 8 (eight) hours as needed for nausea or vomiting. 20 tablet 0  . pantoprazole (PROTONIX) 40 MG tablet TAKE 1 TABLET BY MOUTH EVERY DAY 90 tablet 1  . traMADol (ULTRAM) 50 MG tablet TAKE 2 TABLETS BY MOUTH TWICE A DAY AS NEEDED FOR PAIN 120 tablet 1  . azithromycin (ZITHROMAX) 250 MG tablet 2 tabs poqday1, 1 tab poqday 2-5 (Patient not taking: Reported on 10/10/2018) 6 tablet 0  . meloxicam (MOBIC) 15 MG tablet Take 1 tablet (15 mg total) by mouth daily. (Patient not taking: Reported on 12/08/2018) 90 tablet 0   No current facility-administered medications for this visit.      REVIEW OF SYSTEMS: See HPI for pertinent positives and negatives.  Physical Examination Vitals:   12/08/18 1225  BP: 131/65  Pulse: 91  Resp: 16  Temp: (!) 97.4 F (36.3 C)  TempSrc: Oral  SpO2: 95%  Weight: 142 lb (64.4 kg)  Height: 4\' 9"  (1.448 m)   Body mass index is 30.73 kg/m.  General:  WDWN obese female in NAD Gait: Normal HENT: WNL Eyes: Pupils equal Pulmonary: Slightly labored breathing, limited air movement in all fields, no rales, rhonchi, or wheezing. Occasional dry cough, voice is husky.  Cardiac: RRR, no murmur detected Abdomen: softly obese, mild RUQ tenderness to palpation, + incisional hernia at proximal end of midline abdominal incision, which enlarges when moves from supine to sitting and sitting to supine, moderately painful to palpation over hernia.  I can possible palpate liver margins in this obese abdomen.  Skin: no  rashes, no ulcers, no cellulitis.   VASCULAR EXAM  Carotid Bruits Right Left   Positive Negative      Radial pulses are 2+ palpable bilaterally   Adominal aortic pulse is not palpable                      VASCULAR EXAM: Extremities without  ischemic changes, without Gangrene; without open wounds.                                                                                                          LE Pulses Right Left       FEMORAL  2+ palpable  2+ palpable        POPLITEAL  not palpable   not palpable       POSTERIOR TIBIAL  2+ palpable   1+ palpable        DORSALIS PEDIS      ANTERIOR TIBIAL 2+ palpable  not palpable     Musculoskeletal: no muscle wasting or atrophy; no peripheral edema  Neurologic:  A&O X 3; appropriate affect, sensation is normal; speech is normal, CN 2-12 intact, pain and light touch intact in extremities, motor exam as listed above. Psychiatric: Normal thought content, mood appropriate to clinical situation.    ASSESSMENT:  Felicia [name] is a 53 y.o. female who isstatus post aorto bifemoral bypass graft on 10-23-14 by Dr. Edilia Bo for aortoiliac occlusive disease. She no longer has claudication symptoms with walking, but seems to have bilateral OA hip pain, low back pain; and more recently, left groin painin the last 3 weeks.   Pt has a right carotid bruit. She has no known hx of stroke or TIA.Carotid duplex from 2012 at [facility] showed <50% bilateral ICA stenosis, no change in bilateral ICA on today's exam; increased resistance and disturbed flow in the left vertebral and subclavian arteries compared to the exam on 06-19-11 at [facility].   Fortunately she does not have DM and stays physically active. Unfortunately she continues to smoke, but decreased use, started at age 67.  Over 3 minutes was spent counseling patient re smoking cessation, and patient was given several free resources re smoking cessation.  Midline symptomatic abdominal incisional hernia, at proximal segment: She will discuss with her PCP whether to be referred to general surgeon for evaluation of this.   DATA  Carotid Duplex (12-08-18): 1-39% bilateral ICA stenosis Right vertebral artery flow is  antegrade, left demonstrates high resistance flow.  Right subclavian artery waveforms are normal, left were disturbed.  No change in the bilateral ICA, increased resistance and disturbed flow in the left vertebral and subclavian arteries compared to the exam on 06-19-11 at [facility].   ABI (Date: 12/08/2018):  R:   ABI: 1.05 (was 1.01 on 06-23-17),   PT: tri  DP: tri  TBI:  0.78, (was 0.71)  L:   ABI: 1.05 (was 0.99),   PT: tri  DP: bi  TBI: 0.70, (was 0.80) Bilateral ABI and TBI remain normal with tri and biphasic waveforms.  09-09-18 [place] of abd requested by Dr. Tanya Nones: IMPRESSION: Diffuse increase in liver echogenicity, a finding felt to be indicative of hepatic steatosis. There is suspected fatty sparing near the gallbladder fossa. While no focal liver lesions are evident beyond apparent fatty sparing near the gallbladder fossa, it must be cautioned that the sensitivity of ultrasound for detection of focal liver lesions is diminished in this circumstance.     PLAN:   Based on today's exam and non-invasive vascular lab results, the patient will follow up in 1 year with the following tests: ABI's; carotid duplex in 2 years.  Continue walking at least 30 minutes total daily, in a safe environment.  I advised pt to notify us if she develops concerns re the circulation in her feet or legs.  I discussed in depth with the patient the nature of atherosclerosis, and emphasized the importance of maximal medical management including strict control of blood pressure, blood glucose, and lipid levels, obtaining regular exercise, and cessation of smoking.  The patient is aware that without maximal medical management the underlying atherosclerotic disease process will progress, limiting the benefit of any interventions.  The patient was given information about stroke prevention and what symptoms should prompt the patient to seek immediate medical care.  The patient was  given information about PAD including signs, symptoms, treatment, what symptoms should prompt the patient to seek immediate medical care, and risk reduction measures to take.  Thank you for allowing [place] to participate in this patient's care.  Charisse March, RN, MSN, FNP-C [facility] Office: [phone]  Clinic MD: Edilia Bo in vein clinic, Dr. Randie Heinz on call 12/08/2018 12:46 PM

## 2018-12-19 ENCOUNTER — Other Ambulatory Visit: Payer: Self-pay | Admitting: Family Medicine

## 2018-12-19 NOTE — Telephone Encounter (Signed)
Ok to refill??  Last office visit 09/15/2018.  Last refill 10/24/2018, #1 refill.

## 2019-01-05 ENCOUNTER — Encounter: Payer: Self-pay | Admitting: Family Medicine

## 2019-01-05 ENCOUNTER — Ambulatory Visit: Payer: Medicare Other | Admitting: Family Medicine

## 2019-01-05 VITALS — BP 154/62 | HR 110 | Temp 97.8°F | Resp 18 | Ht <= 58 in | Wt 143.0 lb

## 2019-01-05 DIAGNOSIS — M5431 Sciatica, right side: Secondary | ICD-10-CM

## 2019-01-05 DIAGNOSIS — R8290 Unspecified abnormal findings in urine: Secondary | ICD-10-CM | POA: Diagnosis not present

## 2019-01-05 DIAGNOSIS — N3001 Acute cystitis with hematuria: Secondary | ICD-10-CM

## 2019-01-05 LAB — URINALYSIS, ROUTINE W REFLEX MICROSCOPIC
Bilirubin Urine: NEGATIVE
Glucose, UA: NEGATIVE
Ketones, ur: NEGATIVE
NITRITE: POSITIVE — AB
Specific Gravity, Urine: 1.015 (ref 1.001–1.03)
pH: 5.5 (ref 5.0–8.0)

## 2019-01-05 LAB — MICROSCOPIC MESSAGE

## 2019-01-05 MED ORDER — CIPROFLOXACIN HCL 500 MG PO TABS: 500.0000 mg | ORAL_TABLET | Freq: Two times a day (BID) | ORAL | 0 refills | Status: DC

## 2019-01-05 MED ORDER — CLONAZEPAM 1 MG PO TABS: 1.0000 mg | ORAL_TABLET | Freq: Three times a day (TID) | ORAL | 2 refills | Status: DC

## 2019-01-05 MED ORDER — CYCLOBENZAPRINE HCL 10 MG PO TABS: ORAL_TABLET | ORAL | 1 refills | Status: DC

## 2019-01-05 NOTE — Progress Notes (Signed)
Subjective:    Patient ID: Felicia [name], female    DOB: 12-May-1966, 53 y.o.   MRN: [id number]  HPI  Patient reports a one-month history of dysuria.  She reports increased frequency and urgency.  She reports burning with urination.  She reports pelvic discomfort that improves after urination.  Pain is located over her bladder.  Urinalysis shows +2 blood, +1 nitrites.  Leukocyte esterase consistent with a urinary tract infection.  She denies any nausea or vomiting or fever.  She denies any CVA tenderness.  She also request a refill today on her Klonopin and Flexeril which are due.  When asked about the Flexeril she states that her back is starting to hurt more.  In fact she started to get sharp pain in her lower back that if she sneezes, an electrical shocklike pain will radiate from her lower back into her posterior right hip and down her right leg into her foot.  The pain last seconds to a minute and will gradually go away.  It occurs occasionally.  It is not constant but it sounds neuropathic in nature.  I suspect the bulging disc pressing against a nerve root.  We discussed possibly repeating an MRI of her lower back and at the present time, the patient states the pain is not severe and she can live with it right now but if it worsens she would like to proceed with an MRI Past Medical History:  Diagnosis Date  . Allergy    year around  . Anemia   . Anxiety   . Arthritis   . Asthma   . Atrial fibrillation (HCC)   . Bipolar 1 disorder (HCC)   . Depression   . Ectopic pregnancy   . Headache   . Heart murmur   . Hyperlipidemia   . Insomnia   . Peripheral arterial disease (HCC)   . Pneumonia   . PUD (peptic ulcer disease)   . Right knee pain 12-05-2015   per pt.  she fell out of her husband's truck and landed on her right knee  . Shortness of breath dyspnea    Past Surgical History:  Procedure Laterality Date  . ABDOMINAL AORTAGRAM N/A 09/12/2012   Procedure: ABDOMINAL AORTAGRAM;   Surgeon: Fransisco Hertz, MD;  Location: [facility] CATH LAB;  Service: Cardiovascular;  Laterality: N/A;  . AORTA - BILATERAL FEMORAL ARTERY BYPASS GRAFT N/A 10/23/2014   Procedure: AORTOBIFEMORAL BYPASS GRAFT;  Surgeon: Chuck Hint, MD;  Location: [facility] OR;  Service: Vascular;  Laterality: N/A;  . BACK SURGERY    . CESAREAN SECTION    . COLONOSCOPY  01-14-2004   > 11 yr ago Dr Loreta Ave - normal - report in epic   . DILATATION & CURETTAGE/HYSTEROSCOPY WITH MYOSURE N/A 10/15/2015   Procedure: DILATATION & CURETTAGE/HYSTEROSCOPY WITH MYOSURE;  Surgeon: Ok Edwards, MD;  Location: [facility] ORS;  Service: Gynecology;  Laterality: N/A;  . ESOPHAGOGASTRODUODENOSCOPY  01-14-2004   gastritis- dr Loreta Ave- in epic already   . NOSE SURGERY    . NOSE SURGERY    . SHOULDER SURGERY     rotator cuff repair   . TONSILLECTOMY    . TUBAL LIGATION    . UPPER GASTROINTESTINAL ENDOSCOPY  01-14-2004   in epic- dr Loreta Ave- gastritis    Current Outpatient Medications on File Prior to Visit  Medication Sig Dispense Refill  . amLODipine (NORVASC) 10 MG tablet Take 1 tablet (10 mg total) by mouth daily. 90 tablet 3  .  aspirin 325 MG tablet Take 325 mg by mouth daily.    Marland Kitchen. atorvastatin (LIPITOR) 80 MG tablet TAKE 1 TABLET (80 MG TOTAL) BY MOUTH DAILY. 90 tablet 3  . beclomethasone (QVAR) 80 MCG/ACT inhaler Inhale 2 puffs into the lungs 2 (two) times daily.    Marland Kitchen. dicyclomine (BENTYL) 20 MG tablet TAKE 1 TABLET (20 MG TOTAL) BY MOUTH 3 (THREE) TIMES DAILY WITH MEALS AS NEEDED FOR SPASMS. 20 tablet 0  . diphenhydrAMINE (BENADRYL) 50 MG capsule Take 50 mg by mouth every 6 (six) hours as needed for itching.    Marland Kitchen. EPIPEN 2-PAK 0.3 MG/0.3ML SOAJ injection USE AS DIRECTED 2 Device 0  . hydrochlorothiazide (MICROZIDE) 12.5 MG capsule Take 1 capsule (12.5 mg total) by mouth daily. 90 capsule 3  . ondansetron (ZOFRAN) 4 MG tablet Take 1 tablet (4 mg total) by mouth every 8 (eight) hours as needed for nausea or vomiting. 20 tablet 0  .  pantoprazole (PROTONIX) 40 MG tablet TAKE 1 TABLET BY MOUTH EVERY DAY 90 tablet 1  . traMADol (ULTRAM) 50 MG tablet TAKE 2 TABLETS BY MOUTH TWICE A DAY AS NEEDED FOR PAIN 120 tablet 1   No current facility-administered medications on file prior to visit.    Allergies  Allergen Reactions  . Augmentin [Amoxicillin-Pot Clavulanate] Diarrhea  . Bactrim [Sulfamethoxazole-Trimethoprim] Hives  . Bee Pollen Swelling    Also ticks   . Cephalosporins Hives  . Codeine Nausea Only  . Doxycycline Hives  . Tetracyclines & Related Hives  . Reflex Pain Relief [Menthol] Rash    Muscle Rub   Social History   Socioeconomic History  . Marital status: Married    Spouse name: Not on file  . Number of children: Not on file  . Years of education: Not on file  . Highest education level: Not on file  Occupational History  . Not on file  Social Needs  . Financial resource strain: Not on file  . Food insecurity:    Worry: Not on file    Inability: Not on file  . Transportation needs:    Medical: Not on file    Non-medical: Not on file  Tobacco Use  . Smoking status: Current Every Day Smoker    Packs/day: 0.25    Years: 30.00    Pack years: 7.50    Types: Cigarettes  . Smokeless tobacco: Never Used  Substance and Sexual Activity  . Alcohol use: Yes    Alcohol/week: 1.0 standard drinks    Types: 1 Shots of liquor per week    Comment: very occassionally on social occassions  . Drug use: No  . Sexual activity: Yes  Lifestyle  . Physical activity:    Days per week: Not on file    Minutes per session: Not on file  . Stress: Not on file  Relationships  . Social connections:    Talks on phone: Not on file    Gets together: Not on file    Attends religious service: Not on file    Active member of club or organization: Not on file    Attends meetings of clubs or organizations: Not on file    Relationship status: Not on file  . Intimate partner violence:    Fear of current or ex partner: Not  on file    Emotionally abused: Not on file    Physically abused: Not on file    Forced sexual activity: Not on file  Other Topics Concern  . Not  on file  Social History Narrative  . Not on file   Family History  Adopted: Yes  Problem Relation Age of Onset  . Heart disease Mother   . Diabetes Mother   . Hyperlipidemia Mother   . Hypertension Mother   . Heart attack Mother   . Heart disease Father   . Diabetes Father   . Heart attack Father   . Hypertension Father   . Bladder Cancer Father   . Diabetes Brother   . Lung cancer Paternal Grandfather   . Colon cancer Neg Hx   . Colon polyps Neg Hx   . Rectal cancer Neg Hx   . Stomach cancer Neg Hx       Review of Systems  All other systems reviewed and are negative.      Objective:   Physical Exam  Constitutional: She appears well-developed and well-nourished. No distress.  Cardiovascular: Normal rate, regular rhythm and intact distal pulses.  Murmur heard. Pulmonary/Chest: Effort normal. No respiratory distress. She has no wheezes. She has no rales.  Abdominal: Soft. Bowel sounds are normal. She exhibits no mass. There is no abdominal tenderness. There is no rebound and no guarding.  Genitourinary: Uterus is not enlarged. Cervix exhibits no motion tenderness. Right adnexum displays no mass. Left adnexum displays no mass.  Musculoskeletal:     Lumbar back: She exhibits tenderness and pain.       Back:  Neurological: She exhibits normal muscle tone. Coordination normal.  Skin: She is not diaphoretic.  Vitals reviewed.         Assessment & Plan:  Abnormal urine odor - Plan: Urinalysis, Routine w reflex microscopic  Acute cystitis with hematuria  Right sided sciatica  Offered the patient an MRI of the lumbar spine to evaluate the source of the lumbar radiculopathy.  She declines this at the present time and prefers to watch it.  If it worsens, we will check an MRI.  She appears to have a urinary tract infection  and I will treat that with Cipro 500 mg p.o. twice daily for 5 days.  Her blood pressure is also elevated.  Her last 3 visits here the blood pressure have been normal.  The patient will start checking her blood pressure every day and call me with the values in 1 week.  If greater than 40/90, it will require treatment.

## 2019-01-08 ENCOUNTER — Other Ambulatory Visit: Payer: Self-pay | Admitting: Family Medicine

## 2019-01-08 DIAGNOSIS — M5431 Sciatica, right side: Secondary | ICD-10-CM

## 2019-01-09 NOTE — Telephone Encounter (Signed)
Ok to refill 

## 2019-01-13 ENCOUNTER — Telehealth: Payer: Self-pay | Admitting: Family Medicine

## 2019-01-13 NOTE — Telephone Encounter (Signed)
Pt called and said you wanted to know her BP at home and it was 124/71 - p- 107

## 2019-01-16 NOTE — Telephone Encounter (Signed)
BP is great but heart rate is too high.  Lets try toprol xl 25 mg daily and recheck here in 2 weeks.

## 2019-01-18 MED ORDER — METOPROLOL SUCCINATE ER 25 MG PO TB24: 25.0000 mg | ORAL_TABLET | Freq: Every day | ORAL | 3 refills | Status: DC

## 2019-01-18 NOTE — Telephone Encounter (Signed)
Pt states that she is still having abd pain when she has to urinate - she is not having any burning but it hurts when her bladder get urine in it and she has to go more frequent. The antibx helped but did not resolve the pain.         Pt aware of provider recommendations and apt made.

## 2019-01-19 ENCOUNTER — Other Ambulatory Visit: Payer: Self-pay | Admitting: Family Medicine

## 2019-01-19 MED ORDER — NITROFURANTOIN MONOHYD MACRO 100 MG PO CAPS: 100.0000 mg | ORAL_CAPSULE | Freq: Two times a day (BID) | ORAL | 0 refills | Status: DC

## 2019-01-19 NOTE — Telephone Encounter (Signed)
Pt aware.

## 2019-01-19 NOTE — Telephone Encounter (Signed)
I sent Macrobid to [facility]

## 2019-02-02 ENCOUNTER — Ambulatory Visit: Payer: Medicare Other | Admitting: Family Medicine

## 2019-02-02 ENCOUNTER — Other Ambulatory Visit: Payer: Self-pay

## 2019-02-02 ENCOUNTER — Encounter: Payer: Self-pay | Admitting: Family Medicine

## 2019-02-02 VITALS — BP 160/68 | HR 110 | Temp 98.1°F | Resp 22 | Ht <= 58 in | Wt 144.0 lb

## 2019-02-02 DIAGNOSIS — R000 Tachycardia, unspecified: Secondary | ICD-10-CM

## 2019-02-02 MED ORDER — TRAMADOL HCL 100 MG PO TABS: 100.0000 mg | ORAL_TABLET | Freq: Three times a day (TID) | ORAL | 0 refills | Status: DC

## 2019-02-02 NOTE — Progress Notes (Signed)
Subjective:    Patient ID: Felicia [name], female    DOB: December 29, 1965, 53 y.o.   MRN: [id number]  HPI  01/05/19 Patient reports a one-month history of dysuria.  She reports increased frequency and urgency.  She reports burning with urination.  She reports pelvic discomfort that improves after urination.  Pain is located over her bladder.  Urinalysis shows +2 blood, +1 nitrites.  Leukocyte esterase consistent with a urinary tract infection.  She denies any nausea or vomiting or fever.  She denies any CVA tenderness.  She also request a refill today on her Klonopin and Flexeril which are due.  When asked about the Flexeril she states that her back is starting to hurt more.  In fact she started to get sharp pain in her lower back that if she sneezes, an electrical shocklike pain will radiate from her lower back into her posterior right hip and down her right leg into her foot.  The pain last seconds to a minute and will gradually go away.  It occurs occasionally.  It is not constant but it sounds neuropathic in nature.  I suspect the bulging disc pressing against a nerve root.  We discussed possibly repeating an MRI of her lower back and at the present time, the patient states the pain is not severe and she can live with it right now but if it worsens she would like to proceed with an MRI.  At that time, my plan was: Offered the patient an MRI of the lumbar spine to evaluate the source of the lumbar radiculopathy.  She declines this at the present time and prefers to watch it.  If it worsens, we will check an MRI.  She appears to have a urinary tract infection and I will treat that with Cipro 500 mg p.o. twice daily for 5 days.  Her blood pressure is also elevated.  Her last 3 visits here the blood pressure have been normal.  The patient will start checking her blood pressure every day and call me with the values in 1 week.  If greater than 40/90, it will require treatment.  02/02/19 Patient's blood pressure  today is significantly elevated with a systolic blood pressure in the 160s.  Her heart rate is also elevated to 110 bpm.  He is taking Toprol-XL 25 mg a day however it does not seem to be helping her heart rate or her blood pressure.  Also on her physical exam, she has a 2/6 systolic ejection murmur heard best over the the mitral valve.  She also reports some dyspnea on exertion.  She denies any angina.  She denies any orthopnea.  She denies any paroxysmal nocturnal dyspnea.  She does request that I increase her tramadol.  She is taking 100 mg twice daily however she is now having creasing pain in her back as well as with carpal tunnel pain in her wrist and right hand as well as pain in her left elbow. Past Medical History:  Diagnosis Date  . Allergy    year around  . Anemia   . Anxiety   . Arthritis   . Asthma   . Atrial fibrillation (HCC)   . Bipolar 1 disorder (HCC)   . Depression   . Ectopic pregnancy   . Headache   . Heart murmur   . Hyperlipidemia   . Insomnia   . Peripheral arterial disease (HCC)   . Pneumonia   . PUD (peptic ulcer disease)   . Right  knee pain 12-05-2015   per pt.  she fell out of her husband's truck and landed on her right knee  . Shortness of breath dyspnea    Past Surgical History:  Procedure Laterality Date  . ABDOMINAL AORTAGRAM N/A 09/12/2012   Procedure: ABDOMINAL AORTAGRAM;  Surgeon: Fransisco Hertz, MD;  Location: [facility] CATH LAB;  Service: Cardiovascular;  Laterality: N/A;  . AORTA - BILATERAL FEMORAL ARTERY BYPASS GRAFT N/A 10/23/2014   Procedure: AORTOBIFEMORAL BYPASS GRAFT;  Surgeon: Chuck Hint, MD;  Location: [facility] OR;  Service: Vascular;  Laterality: N/A;  . BACK SURGERY    . CESAREAN SECTION    . COLONOSCOPY  01-14-2004   > 11 yr ago Dr Loreta Ave - normal - report in epic   . DILATATION & CURETTAGE/HYSTEROSCOPY WITH MYOSURE N/A 10/15/2015   Procedure: DILATATION & CURETTAGE/HYSTEROSCOPY WITH MYOSURE;  Surgeon: Ok Edwards, MD;  Location: [facility]  ORS;  Service: Gynecology;  Laterality: N/A;  . ESOPHAGOGASTRODUODENOSCOPY  01-14-2004   gastritis- dr Loreta Ave- in epic already   . NOSE SURGERY    . NOSE SURGERY    . SHOULDER SURGERY     rotator cuff repair   . TONSILLECTOMY    . TUBAL LIGATION    . UPPER GASTROINTESTINAL ENDOSCOPY  01-14-2004   in epic- dr Loreta Ave- gastritis    Current Outpatient Medications on File Prior to Visit  Medication Sig Dispense Refill  . amLODipine (NORVASC) 10 MG tablet Take 1 tablet (10 mg total) by mouth daily. 90 tablet 3  . aspirin 325 MG tablet Take 325 mg by mouth daily.    Marland Kitchen atorvastatin (LIPITOR) 80 MG tablet TAKE 1 TABLET (80 MG TOTAL) BY MOUTH DAILY. 90 tablet 3  . beclomethasone (QVAR) 80 MCG/ACT inhaler Inhale 2 puffs into the lungs 2 (two) times daily.    . clonazePAM (KLONOPIN) 1 MG tablet Take 1 tablet (1 mg total) by mouth 3 (three) times daily. 90 tablet 2  . cyclobenzaprine (FLEXERIL) 10 MG tablet TAKE 1 TABLET BY MOUTH THREE TIMES DAILY AS NEEDED FOR MUSCLE SPASMS 90 tablet 1  . dicyclomine (BENTYL) 20 MG tablet TAKE 1 TABLET (20 MG TOTAL) BY MOUTH 3 (THREE) TIMES DAILY WITH MEALS AS NEEDED FOR SPASMS. 20 tablet 0  . diphenhydrAMINE (BENADRYL) 50 MG capsule Take 50 mg by mouth every 6 (six) hours as needed for itching.    Marland Kitchen EPIPEN 2-PAK 0.3 MG/0.3ML SOAJ injection USE AS DIRECTED 2 Device 0  . hydrochlorothiazide (MICROZIDE) 12.5 MG capsule Take 1 capsule (12.5 mg total) by mouth daily. 90 capsule 3  . metoprolol succinate (TOPROL-XL) 25 MG 24 hr tablet Take 1 tablet (25 mg total) by mouth daily. 90 tablet 3  . ondansetron (ZOFRAN) 4 MG tablet Take 1 tablet (4 mg total) by mouth every 8 (eight) hours as needed for nausea or vomiting. 20 tablet 0  . pantoprazole (PROTONIX) 40 MG tablet TAKE 1 TABLET BY MOUTH EVERY DAY 90 tablet 1  . traMADol (ULTRAM) 50 MG tablet TAKE 2 TABLETS BY MOUTH TWICE A DAY AS NEEDED FOR PAIN 120 tablet 1   No current facility-administered medications on file prior to  visit.    Allergies  Allergen Reactions  . Augmentin [Amoxicillin-Pot Clavulanate] Diarrhea  . Bactrim [Sulfamethoxazole-Trimethoprim] Hives  . Bee Pollen Swelling    Also ticks   . Cephalosporins Hives  . Codeine Nausea Only  . Doxycycline Hives  . Tetracyclines & Related Hives  . Reflex Pain Relief [Menthol] Rash  Muscle Rub   Social History   Socioeconomic History  . Marital status: Married    Spouse name: Not on file  . Number of children: Not on file  . Years of education: Not on file  . Highest education level: Not on file  Occupational History  . Not on file  Social Needs  . Financial resource strain: Not on file  . Food insecurity:    Worry: Not on file    Inability: Not on file  . Transportation needs:    Medical: Not on file    Non-medical: Not on file  Tobacco Use  . Smoking status: Current Every Day Smoker    Packs/day: 0.25    Years: 30.00    Pack years: 7.50    Types: Cigarettes  . Smokeless tobacco: Never Used  Substance and Sexual Activity  . Alcohol use: Yes    Alcohol/week: 1.0 standard drinks    Types: 1 Shots of liquor per week    Comment: very occassionally on social occassions  . Drug use: No  . Sexual activity: Yes  Lifestyle  . Physical activity:    Days per week: Not on file    Minutes per session: Not on file  . Stress: Not on file  Relationships  . Social connections:    Talks on phone: Not on file    Gets together: Not on file    Attends religious service: Not on file    Active member of club or organization: Not on file    Attends meetings of clubs or organizations: Not on file    Relationship status: Not on file  . Intimate partner violence:    Fear of current or ex partner: Not on file    Emotionally abused: Not on file    Physically abused: Not on file    Forced sexual activity: Not on file  Other Topics Concern  . Not on file  Social History Narrative  . Not on file   Family History  Adopted: Yes  Problem  Relation Age of Onset  . Heart disease Mother   . Diabetes Mother   . Hyperlipidemia Mother   . Hypertension Mother   . Heart attack Mother   . Heart disease Father   . Diabetes Father   . Heart attack Father   . Hypertension Father   . Bladder Cancer Father   . Diabetes Brother   . Lung cancer Paternal Grandfather   . Colon cancer Neg Hx   . Colon polyps Neg Hx   . Rectal cancer Neg Hx   . Stomach cancer Neg Hx       Review of Systems  All other systems reviewed and are negative.      Objective:   Physical Exam  Constitutional: She appears well-developed and well-nourished. No distress.  Cardiovascular: Regular rhythm and intact distal pulses. Tachycardia present.  Murmur heard. Pulmonary/Chest: Effort normal. No respiratory distress. She has no wheezes. She has no rales.  Abdominal: Soft. Bowel sounds are normal. She exhibits no mass. There is no abdominal tenderness. There is no rebound and no guarding.  Genitourinary: Uterus is not enlarged. Cervix exhibits no motion tenderness. Right adnexum displays no mass. Left adnexum displays no mass.  Neurological: She exhibits normal muscle tone. Coordination normal.  Skin: She is not diaphoretic.  Vitals reviewed.         Assessment & Plan:  Tachycardia - Plan: CBC with Differential/Platelet, COMPLETE METABOLIC PANEL WITH GFR, TSH, ECHOCARDIOGRAM COMPLETE  Given the tachycardia, I recommend an echocardiogram particularly given her dyspnea on exertion as well as her murmur to evaluate further.  The patient had a stress test in 2015 which showed a normal ejection fraction and no evidence of reversible ischemia however given the new nature of the murmur and her worsening dyspnea and now persistent tachycardia, I believe she warrants a structural evaluation of the heart.  I will also check a TSH to evaluate for evidence of hyperthyroidism as well as a CBC to evaluate for anemia and also CMP to evaluate for any electrolyte  disturbances.  Meanwhile increase Toprol-XL to 50 mg a day and recheck heart rate and blood pressure next week.  Will defer management of her carpal tunnel until her heart rate and blood pressure are better.  Will increase her tramadol to 100 mg 3 times daily 90 tablets a month

## 2019-02-04 LAB — CBC WITH DIFFERENTIAL/PLATELET
ABSOLUTE MONOCYTES: 669 {cells}/uL (ref 200–950)
Basophils Absolute: 61 cells/uL (ref 0–200)
Basophils Relative: 0.8 %
Eosinophils Absolute: 137 cells/uL (ref 15–500)
Eosinophils Relative: 1.8 %
HEMATOCRIT: 40.3 % (ref 35.0–45.0)
Hemoglobin: 13.4 g/dL (ref 11.7–15.5)
Lymphs Abs: 2386 cells/uL (ref 850–3900)
MCH: 29.3 pg (ref 27.0–33.0)
MCHC: 33.3 g/dL (ref 32.0–36.0)
MCV: 88.2 fL (ref 80.0–100.0)
MPV: 10.5 fL (ref 7.5–12.5)
Monocytes Relative: 8.8 %
NEUTROS PCT: 57.2 %
Neutro Abs: 4347 cells/uL (ref 1500–7800)
Platelets: 272 10*3/uL (ref 140–400)
RBC: 4.57 10*6/uL (ref 3.80–5.10)
RDW: 12.7 % (ref 11.0–15.0)
Total Lymphocyte: 31.4 %
WBC: 7.6 10*3/uL (ref 3.8–10.8)

## 2019-02-04 LAB — COMPLETE METABOLIC PANEL WITH GFR
AG Ratio: 1.6 (calc) (ref 1.0–2.5)
ALT: 18 U/L (ref 6–29)
AST: 21 U/L (ref 10–35)
Albumin: 4.1 g/dL (ref 3.6–5.1)
Alkaline phosphatase (APISO): 72 U/L (ref 37–153)
BUN: 10 mg/dL (ref 7–25)
CO2: 26 mmol/L (ref 20–32)
CREATININE: 0.85 mg/dL (ref 0.50–1.05)
Calcium: 9.4 mg/dL (ref 8.6–10.4)
Chloride: 107 mmol/L (ref 98–110)
GFR, Est African American: 89 mL/min/{1.73_m2} (ref >=60)
GFR, Est Non African American: 77 mL/min/{1.73_m2} (ref >=60)
GLOBULIN: 2.6 g/dL (ref 1.9–3.7)
Glucose, Bld: 162 mg/dL — ABNORMAL HIGH (ref 65–99)
Potassium: 4 mmol/L (ref 3.5–5.3)
Sodium: 144 mmol/L (ref 135–146)
Total Bilirubin: 0.3 mg/dL (ref 0.2–1.2)
Total Protein: 6.7 g/dL (ref 6.1–8.1)

## 2019-02-04 LAB — TEST AUTHORIZATION

## 2019-02-04 LAB — HEMOGLOBIN A1C
Hgb A1c MFr Bld: 5.9 % of total Hgb — ABNORMAL HIGH (ref <5.7)
Mean Plasma Glucose: 123 (calc)
eAG (mmol/L): 6.8 (calc)

## 2019-02-04 LAB — TSH: TSH: 2.64 mIU/L

## 2019-02-06 ENCOUNTER — Other Ambulatory Visit: Payer: Self-pay

## 2019-02-06 ENCOUNTER — Ambulatory Visit (HOSPITAL_COMMUNITY): Payer: Medicare Other | Attending: Cardiology

## 2019-02-06 DIAGNOSIS — I10 Essential (primary) hypertension: Secondary | ICD-10-CM | POA: Insufficient documentation

## 2019-02-06 DIAGNOSIS — Z8249 Family history of ischemic heart disease and other diseases of the circulatory system: Secondary | ICD-10-CM | POA: Insufficient documentation

## 2019-02-06 DIAGNOSIS — J45909 Unspecified asthma, uncomplicated: Secondary | ICD-10-CM | POA: Insufficient documentation

## 2019-02-06 DIAGNOSIS — F17200 Nicotine dependence, unspecified, uncomplicated: Secondary | ICD-10-CM | POA: Insufficient documentation

## 2019-02-06 DIAGNOSIS — E785 Hyperlipidemia, unspecified: Secondary | ICD-10-CM | POA: Insufficient documentation

## 2019-02-06 DIAGNOSIS — I352 Nonrheumatic aortic (valve) stenosis with insufficiency: Secondary | ICD-10-CM | POA: Insufficient documentation

## 2019-02-06 DIAGNOSIS — R000 Tachycardia, unspecified: Secondary | ICD-10-CM

## 2019-02-06 DIAGNOSIS — R0600 Dyspnea, unspecified: Secondary | ICD-10-CM | POA: Diagnosis not present

## 2019-02-09 ENCOUNTER — Ambulatory Visit: Payer: Medicare Other | Admitting: Family Medicine

## 2019-03-08 ENCOUNTER — Other Ambulatory Visit: Payer: Self-pay | Admitting: Family Medicine

## 2019-03-08 DIAGNOSIS — M5431 Sciatica, right side: Secondary | ICD-10-CM

## 2019-03-14 ENCOUNTER — Other Ambulatory Visit: Payer: Self-pay | Admitting: Family Medicine

## 2019-03-15 NOTE — Telephone Encounter (Signed)
Requesting refill    Tramadol  LOV: 02/02/19  LRF:  02/02/19

## 2019-03-31 ENCOUNTER — Other Ambulatory Visit: Payer: Self-pay | Admitting: Family Medicine

## 2019-04-06 ENCOUNTER — Other Ambulatory Visit: Payer: Self-pay | Admitting: Family Medicine

## 2019-04-06 NOTE — Telephone Encounter (Signed)
Ok to refill??  Last office visit 02/02/2019.  Last refill 01/05/2019, #2 refills.

## 2019-04-10 ENCOUNTER — Other Ambulatory Visit: Payer: Self-pay | Admitting: Family Medicine

## 2019-04-10 DIAGNOSIS — M5431 Sciatica, right side: Secondary | ICD-10-CM

## 2019-04-10 MED ORDER — TRAMADOL HCL 50 MG PO TABS: ORAL_TABLET | ORAL | 0 refills | Status: DC

## 2019-04-10 MED ORDER — CYCLOBENZAPRINE HCL 10 MG PO TABS: 10.0000 mg | ORAL_TABLET | Freq: Three times a day (TID) | ORAL | 1 refills | Status: DC | PRN

## 2019-04-10 NOTE — Telephone Encounter (Signed)
Requesting refill    Flexeril and Tramadol  LOV: 02/02/19  LRF:  03/08/19 &  03/16/19  Pt had issue with pharm on the refill on her flexeril as they went up on price. She did not get the flexeril refilled and would like it sent to a different pharm ([facility])

## 2019-05-11 ENCOUNTER — Ambulatory Visit (INDEPENDENT_AMBULATORY_CARE_PROVIDER_SITE_OTHER): Payer: Medicare Other | Admitting: Family Medicine

## 2019-05-11 ENCOUNTER — Other Ambulatory Visit: Payer: Self-pay

## 2019-05-11 ENCOUNTER — Encounter: Payer: Self-pay | Admitting: Family Medicine

## 2019-05-11 VITALS — BP 134/74 | HR 100 | Temp 97.9°F | Resp 14 | Ht <= 58 in | Wt 142.0 lb

## 2019-05-11 DIAGNOSIS — M7989 Other specified soft tissue disorders: Secondary | ICD-10-CM | POA: Diagnosis not present

## 2019-05-11 DIAGNOSIS — R000 Tachycardia, unspecified: Secondary | ICD-10-CM

## 2019-05-11 DIAGNOSIS — M75102 Unspecified rotator cuff tear or rupture of left shoulder, not specified as traumatic: Secondary | ICD-10-CM

## 2019-05-11 DIAGNOSIS — M12812 Other specific arthropathies, not elsewhere classified, left shoulder: Secondary | ICD-10-CM | POA: Diagnosis not present

## 2019-05-11 NOTE — Progress Notes (Signed)
Subjective:    Patient ID: Felicia [name], female    DOB: 03-Jul-1966, 53 y.o.   MRN: [id number]  HPI  01/05/19 Patient reports a one-month history of dysuria.  She reports increased frequency and urgency.  She reports burning with urination.  She reports pelvic discomfort that improves after urination.  Pain is located over her bladder.  Urinalysis shows +2 blood, +1 nitrites.  Leukocyte esterase consistent with a urinary tract infection.  She denies any nausea or vomiting or fever.  She denies any CVA tenderness.  She also request a refill today on her Klonopin and Flexeril which are due.  When asked about the Flexeril she states that her back is starting to hurt more.  In fact she started to get sharp pain in her lower back that if she sneezes, an electrical shocklike pain will radiate from her lower back into her posterior right hip and down her right leg into her foot.  The pain last seconds to a minute and will gradually go away.  It occurs occasionally.  It is not constant but it sounds neuropathic in nature.  I suspect the bulging disc pressing against a nerve root.  We discussed possibly repeating an MRI of her lower back and at the present time, the patient states the pain is not severe and she can live with it right now but if it worsens she would like to proceed with an MRI.  At that time, my plan was: Offered the patient an MRI of the lumbar spine to evaluate the source of the lumbar radiculopathy.  She declines this at the present time and prefers to watch it.  If it worsens, we will check an MRI.  She appears to have a urinary tract infection and I will treat that with Cipro 500 mg p.o. twice daily for 5 days.  Her blood pressure is also elevated.  Her last 3 visits here the blood pressure have been normal.  The patient will start checking her blood pressure every day and call me with the values in 1 week.  If greater than 40/90, it will require treatment.  02/02/19 Patient's blood pressure  today is significantly elevated with a systolic blood pressure in the 160s.  Her heart rate is also elevated to 110 bpm.  He is taking Toprol-XL 25 mg a day however it does not seem to be helping her heart rate or her blood pressure.  Also on her physical exam, she has a 2/6 systolic ejection murmur heard best over the the mitral valve.  She also reports some dyspnea on exertion.  She denies any angina.  She denies any orthopnea.  She denies any paroxysmal nocturnal dyspnea.  She does request that I increase her tramadol.  She is taking 100 mg twice daily however she is now having creasing pain in her back as well as with carpal tunnel pain in her wrist and right hand as well as pain in her left elbow.  At that time, my plan was: Given the tachycardia, I recommend an echocardiogram particularly given her dyspnea on exertion as well as her murmur to evaluate further.  The patient had a stress test in 2015 which showed a normal ejection fraction and no evidence of reversible ischemia however given the new nature of the murmur and her worsening dyspnea and now persistent tachycardia, I believe she warrants a structural evaluation of the heart.  I will also check a TSH to evaluate for evidence of hyperthyroidism as well  as a CBC to evaluate for anemia and also CMP to evaluate for any electrolyte disturbances.  Meanwhile increase Toprol-XL to 50 mg a day and recheck heart rate and blood pressure next week.  Will defer management of her carpal tunnel until her heart rate and blood pressure are better.  Will increase her tramadol to 100 mg 3 times daily 90 tablets a month  05/11/19 Patient presents today with several issues.  First, she never increased her Toprol as directed at her last visit.  Her heart rate still remains above 100 it is high as 120 bpm.  She is still taking Toprol-XL 25 mg a day.  Second she reports swelling in her legs that comes and goes.  She is not taking hydrochlorothiazide even though it is on  her medicine list.  She is also on amlodipine 10 mg a day.  She denies any sweats, chest pain, nausea vomiting pheresis.  She denies any orthopnea or paroxysmal nocturnal dyspnea.  Third issue is she reports pain in her left shoulder.  The pain is been present since February.  She has a positive drop test.  She has pain with empty can testing.  She has significant weakness with shoulder abduction.  She is unable to actively raise her shoulder greater than 60 degrees due to severe pain and weakness. Past Medical History:  Diagnosis Date   Allergy    year around   Anemia    Anxiety    Arthritis    Asthma    Atrial fibrillation (HCC)    Bipolar 1 disorder (HCC)    Depression    Ectopic pregnancy    Headache    Heart murmur    Hyperlipidemia    Insomnia    Peripheral arterial disease (HCC)    Pneumonia    PUD (peptic ulcer disease)    Right knee pain 12-05-2015   per pt.  she fell out of her husband's truck and landed on her right knee   Shortness of breath dyspnea    Past Surgical History:  Procedure Laterality Date   ABDOMINAL AORTAGRAM N/A 09/12/2012   Procedure: ABDOMINAL Ronny FlurryAORTAGRAM;  Surgeon: Fransisco HertzBrian L Chen, MD;  [facility] LAB;  Service: Cardiovascular;  Laterality: N/A;   AORTA - BILATERAL FEMORAL ARTERY BYPASS GRAFT N/A 10/23/2014   Procedure: AORTOBIFEMORAL BYPASS GRAFT;  Surgeon: Chuck Hinthristopher S Dickson, MD;  [facility];  Service: Vascular;  Laterality: N/A;   BACK SURGERY     CESAREAN SECTION     COLONOSCOPY  01-14-2004   > 11 yr ago Dr Loreta AveMann - normal - report in epic    DILATATION & CURETTAGE/HYSTEROSCOPY WITH MYOSURE N/A 10/15/2015   Procedure: DILATATION & CURETTAGE/HYSTEROSCOPY WITH MYOSURE;  Surgeon: Ok EdwardsJuan H Fernandez, MD;  Location: [facility] ORS;  Service: Gynecology;  Laterality: N/A;   ESOPHAGOGASTRODUODENOSCOPY  01-14-2004   gastritis- dr Loreta Avemann- in epic already    NOSE SURGERY     NOSE SURGERY     SHOULDER SURGERY     rotator cuff repair     TONSILLECTOMY     TUBAL LIGATION     UPPER GASTROINTESTINAL ENDOSCOPY  01-14-2004   in epic- dr Loreta Avemann- gastritis    Current Outpatient Medications on File Prior to Visit  Medication Sig Dispense Refill   amLODipine (NORVASC) 10 MG tablet Take 1 tablet (10 mg total) by mouth daily. 90 tablet 3   aspirin 325 MG tablet Take 325 mg by mouth daily.     atorvastatin (LIPITOR) 80 MG  tablet TAKE 1 TABLET BY MOUTH EVERY DAY 90 tablet 3   beclomethasone (QVAR) 80 MCG/ACT inhaler Inhale 2 puffs into the lungs 2 (two) times daily.     clonazePAM (KLONOPIN) 1 MG tablet TAKE 1 TABLET (1 MG TOTAL) BY MOUTH 3 (THREE) TIMES DAILY. 90 tablet 2   cyclobenzaprine (FLEXERIL) 10 MG tablet Take 1 tablet (10 mg total) by mouth 3 (three) times daily as needed. for muscle spams 90 tablet 1   dicyclomine (BENTYL) 20 MG tablet TAKE 1 TABLET (20 MG TOTAL) BY MOUTH 3 (THREE) TIMES DAILY WITH MEALS AS NEEDED FOR SPASMS. 20 tablet 0   diphenhydrAMINE (BENADRYL) 50 MG capsule Take 50 mg by mouth every 6 (six) hours as needed for itching.     metoprolol succinate (TOPROL-XL) 25 MG 24 hr tablet Take 1 tablet (25 mg total) by mouth daily. 90 tablet 3   ondansetron (ZOFRAN) 4 MG tablet Take 1 tablet (4 mg total) by mouth every 8 (eight) hours as needed for nausea or vomiting. 20 tablet 0   pantoprazole (PROTONIX) 40 MG tablet TAKE 1 TABLET BY MOUTH EVERY DAY 90 tablet 1   traMADol (ULTRAM) 50 MG tablet TAKE 2 TABLETS (50 MG) BY MOUTH THREE TIMES A DAY 180 tablet 0   EPIPEN 2-PAK 0.3 MG/0.3ML SOAJ injection USE AS DIRECTED (Patient not taking: Reported on 05/11/2019) 2 Device 0   hydrochlorothiazide (MICROZIDE) 12.5 MG capsule Take 1 capsule (12.5 mg total) by mouth daily. (Patient not taking: Reported on 05/11/2019) 90 capsule 3   No current facility-administered medications on file prior to visit.    Allergies  Allergen Reactions   Augmentin [Amoxicillin-Pot Clavulanate] Diarrhea   Bactrim  [Sulfamethoxazole-Trimethoprim] Hives   Bee Pollen Swelling    Also ticks    Cephalosporins Hives   Codeine Nausea Only   Doxycycline Hives   Tetracyclines & Related Hives   Reflex Pain Relief [Menthol] Rash    Muscle Rub   Social History   Socioeconomic History   Marital status: Married    Spouse name: Not on file   Number of children: Not on file   Years of education: Not on file   Highest education level: Not on file  Occupational History   Not on file  Social Needs   Financial resource strain: Not on file   Food insecurity    Worry: Not on file    Inability: Not on file   Transportation needs    Medical: Not on file    Non-medical: Not on file  Tobacco Use   Smoking status: Current Every Day Smoker    Packs/day: 0.25    Years: 30.00    Pack years: 7.50    Types: Cigarettes   Smokeless tobacco: Never Used  Substance and Sexual Activity   Alcohol use: Yes    Alcohol/week: 1.0 standard drinks    Types: 1 Shots of liquor per week    Comment: very occassionally on social occassions   Drug use: No   Sexual activity: Yes  Lifestyle   Physical activity    Days per week: Not on file    Minutes per session: Not on file   Stress: Not on file  Relationships   Social connections    Talks on phone: Not on file    Gets together: Not on file    Attends religious service: Not on file    Active member of club or organization: Not on file    Attends meetings of clubs or  organizations: Not on file    Relationship status: Not on file   Intimate partner violence    Fear of current or ex partner: Not on file    Emotionally abused: Not on file    Physically abused: Not on file    Forced sexual activity: Not on file  Other Topics Concern   Not on file  Social History Narrative   Not on file   Family History  Adopted: Yes  Problem Relation Age of Onset   Heart disease Mother    Diabetes Mother    Hyperlipidemia Mother    Hypertension  Mother    Heart attack Mother    Heart disease Father    Diabetes Father    Heart attack Father    Hypertension Father    Bladder Cancer Father    Diabetes Brother    Lung cancer Paternal Grandfather    Colon cancer Neg Hx    Colon polyps Neg Hx    Rectal cancer Neg Hx    Stomach cancer Neg Hx       Review of Systems  All other systems reviewed and are negative.      Objective:   Physical Exam  Constitutional: She appears well-developed and well-nourished. No distress.  Cardiovascular: Regular rhythm and intact distal pulses. Tachycardia present.  Murmur heard. Pulmonary/Chest: Effort normal. No respiratory distress. She has no wheezes. She has no rales.  Abdominal: Soft. Bowel sounds are normal. She exhibits no mass. There is no abdominal tenderness. There is no rebound and no guarding.  Genitourinary: Uterus is not enlarged. Cervix exhibits no motion tenderness. Right adnexum displays no mass. Left adnexum displays no mass.  Musculoskeletal:     Left shoulder: She exhibits decreased range of motion, tenderness, pain and decreased strength.  Neurological: She exhibits normal muscle tone. Coordination normal.  Skin: She is not diaphoretic.  Vitals reviewed.         Assessment & Plan:  1. Rotator cuff tear arthropathy, left Exam suggest rotator cuff tear.  Proceed with an MRI to evaluate further - MR Shoulder Left Wo Contrast; Future  #2 tachycardia-increase Toprol-XL to 50 mg a day and recheck heart rate in 2 weeks  #3 leg swelling.  Discontinue amlodipine and recheck leg swelling in 2 weeks.

## 2019-05-12 ENCOUNTER — Other Ambulatory Visit: Payer: Self-pay | Admitting: Family Medicine

## 2019-05-15 NOTE — Telephone Encounter (Signed)
Requested Prescriptions   Pending Prescriptions Disp Refills  . traMADol (ULTRAM) 50 MG tablet [Pharmacy Med Name: TRAMADOL HCL 50 MG TABLET] 180 tablet 0    Sig: TAKE 2 TABLETS BY MOUTH 3 TIMES A DAY   Last OV 05/11/2019 Last written 04/10/2019

## 2019-05-23 ENCOUNTER — Other Ambulatory Visit: Payer: Self-pay | Admitting: Family Medicine

## 2019-05-31 ENCOUNTER — Other Ambulatory Visit: Payer: Self-pay | Admitting: Family Medicine

## 2019-05-31 NOTE — Telephone Encounter (Signed)
Pt is going on vacation on 06/10/19 and would like to get her Tramadol refilled early if possible. It is due 05/14/19 and she needs it on 06/09/19 to take with her on vacation. I set up rx for it not to be filled until 06/09/19 if ok please send.

## 2019-06-01 MED ORDER — TRAMADOL HCL 50 MG PO TABS: ORAL_TABLET | ORAL | 0 refills | Status: DC

## 2019-06-06 ENCOUNTER — Other Ambulatory Visit: Payer: Self-pay | Admitting: Family Medicine

## 2019-06-06 DIAGNOSIS — M5431 Sciatica, right side: Secondary | ICD-10-CM

## 2019-06-06 NOTE — Telephone Encounter (Signed)
Ok to refill 

## 2019-07-05 ENCOUNTER — Other Ambulatory Visit: Payer: Self-pay | Admitting: Family Medicine

## 2019-07-05 NOTE — Telephone Encounter (Signed)
Requesting refill  Klonopin  LOV:  05/11/19  LRF:   04/06/19

## 2019-07-18 ENCOUNTER — Other Ambulatory Visit: Payer: Self-pay | Admitting: Family Medicine

## 2019-07-18 NOTE — Telephone Encounter (Signed)
Ok to refill??  Last office visit 05/11/2019.  Last refill 06/01/2019.

## 2019-07-19 ENCOUNTER — Other Ambulatory Visit: Payer: Self-pay

## 2019-07-19 ENCOUNTER — Ambulatory Visit
Admission: RE | Admit: 2019-07-19 | Discharge: 2019-07-19 | Disposition: A | Discharge status: Home / Self Care | Payer: Medicare Other | Source: Ambulatory Visit | Attending: Family Medicine | Admitting: Family Medicine

## 2019-07-19 DIAGNOSIS — M75102 Unspecified rotator cuff tear or rupture of left shoulder, not specified as traumatic: Secondary | ICD-10-CM

## 2019-07-19 DIAGNOSIS — M12812 Other specific arthropathies, not elsewhere classified, left shoulder: Secondary | ICD-10-CM

## 2019-07-24 ENCOUNTER — Other Ambulatory Visit: Payer: Self-pay | Admitting: Family Medicine

## 2019-07-24 DIAGNOSIS — S46812D Strain of other muscles, fascia and tendons at shoulder and upper arm level, left arm, subsequent encounter: Secondary | ICD-10-CM

## 2019-07-24 DIAGNOSIS — S4382XD Sprain of other specified parts of left shoulder girdle, subsequent encounter: Secondary | ICD-10-CM

## 2019-08-01 ENCOUNTER — Encounter: Payer: Self-pay | Admitting: Orthopaedic Surgery

## 2019-08-01 ENCOUNTER — Ambulatory Visit (INDEPENDENT_AMBULATORY_CARE_PROVIDER_SITE_OTHER): Payer: Medicare Other | Admitting: Orthopaedic Surgery

## 2019-08-01 DIAGNOSIS — S46812A Strain of other muscles, fascia and tendons at shoulder and upper arm level, left arm, initial encounter: Secondary | ICD-10-CM

## 2019-08-01 DIAGNOSIS — S4382XA Sprain of other specified parts of left shoulder girdle, initial encounter: Secondary | ICD-10-CM | POA: Diagnosis not present

## 2019-08-01 NOTE — Progress Notes (Signed)
Office Visit Note   Patient: Felicia [name]           Date of Birth: 09-Aug-1966           MRN: [id number] Visit Date: 08/01/2019              Requested by: Felicia Frizzle, MD [name] [name],  [name] [id number] PCP: Felicia Frizzle, MD   Assessment & Plan: Visit Diagnoses:  1. Partial tear of left subscapularis tendon, initial encounter     Plan: Impression is partial tear of the left subscapularis tendon with muscle atrophy on MRI.  These findings are consistent with chronic changes.  Hopefully we can get this to settle down with a cortisone injection and physical therapy at home exercises.  She is agreeable to trying these measures first.  We will recheck her in 6 weeks.  Dr. Junius [name] will perform the left shoulder injection today.  Follow-Up Instructions: Return in about 6 weeks (around 09/12/2019).   Orders:  No orders of the defined types were placed in this encounter.  No orders of the defined types were placed in this encounter.     Procedures: No procedures performed   Clinical Data: No additional findings.   Subjective: Chief Complaint  Patient presents with  . Left Shoulder - Follow-up    Felicia [name] is a 53 year old female who comes in for chronic left shoulder pain.  She denies any injuries but she is status post left rotator cuff repair years ago.  She has had 5 rotator cuff surgeries to her right shoulder.  She is right-hand dominant but has had to use her left arm much more due to the chronic right shoulder pain.  She endorses pain in the front of her shoulder and some on the side and that is worse with lying down.  She denies any numbness and tingling.   Review of Systems  Constitutional: Negative.   HENT: Negative.   Eyes: Negative.   Respiratory: Negative.   Cardiovascular: Negative.   Endocrine: Negative.   Musculoskeletal: Negative.   Neurological: Negative.   Hematological: Negative.   Psychiatric/Behavioral: Negative.   All  other systems reviewed and are negative.    Objective: Vital Signs: LMP 12/26/2012   Physical Exam Vitals signs and nursing note reviewed.  Constitutional:      Appearance: She is well-developed.  Pulmonary:     Effort: Pulmonary effort is normal.  Skin:    General: Skin is warm.     Capillary Refill: Capillary refill takes less than 2 seconds.  Neurological:     Mental Status: She is alert and oriented to person, place, and time.  Psychiatric:        Behavior: Behavior normal.        Thought Content: Thought content normal.        Judgment: Judgment normal.     Ortho Exam Left shoulder exam shows pain with forward elevation pain with belly press, mild pain with bearhug, pain with passive external rotation to 35 degrees.  Fully healed surgical scars.  Passive range of motion is normal.  Empty can testing elicits mild pain. Specialty Comments:  No specialty comments available.  Imaging: No results found.   PMFS History: Patient Active Problem List   Diagnosis Date Noted  . Partial tear of left subscapularis tendon 08/01/2019  . Low back strain 05/08/2015  . Asthma, chronic 05/08/2015  . Atherosclerosis of native artery of both lower extremities with intermittent claudication (  [name]) 10/23/2014  . PVD (peripheral vascular disease) with claudication ([name]) 09/26/2014  . Atherosclerosis of native arteries of extremity with intermittent claudication ([name]) 09/14/2012  . PUD (peptic ulcer disease)   . Hyperlipidemia    Past Medical History:  Diagnosis Date  . Allergy    year around  . Anemia   . Anxiety   . Arthritis   . Asthma   . Atrial fibrillation ([name])   . Bipolar 1 disorder ([name])   . Depression   . Ectopic pregnancy   . Headache   . Heart murmur   . Hyperlipidemia   . Insomnia   . Peripheral arterial disease ([name])   . Pneumonia   . PUD (peptic ulcer disease)   . Right knee pain 12-05-2015   per pt.  she fell out of her husband's truck and landed on her  right knee  . Shortness of breath dyspnea     Family History  Adopted: Yes  Problem Relation Age of Onset  . Heart disease Mother   . Diabetes Mother   . Hyperlipidemia Mother   . Hypertension Mother   . Heart attack Mother   . Heart disease Father   . Diabetes Father   . Heart attack Father   . Hypertension Father   . Bladder Cancer Father   . Diabetes Brother   . Lung cancer Paternal Grandfather   . Colon cancer Neg Hx   . Colon polyps Neg Hx   . Rectal cancer Neg Hx   . Stomach cancer Neg Hx     Past Surgical History:  Procedure Laterality Date  . ABDOMINAL AORTAGRAM N/A 09/12/2012   Procedure: ABDOMINAL AORTAGRAM;  Surgeon: Felicia HertzBrian L Chen, MD;  [name];  Service: Cardiovascular;  Laterality: N/A;  . AORTA - BILATERAL FEMORAL ARTERY BYPASS GRAFT N/A 10/23/2014   Procedure: AORTOBIFEMORAL BYPASS GRAFT;  Surgeon: Felicia Hinthristopher S Dickson, MD;  [name];  Service: Vascular;  Laterality: N/A;  . BACK SURGERY    . CESAREAN SECTION    . COLONOSCOPY  01-14-2004   > 11 yr ago Dr Felicia [name] - normal - report in epic   . DILATATION & CURETTAGE/HYSTEROSCOPY WITH MYOSURE N/A 10/15/2015   Procedure: DILATATION & CURETTAGE/HYSTEROSCOPY WITH MYOSURE;  Surgeon: Felicia EdwardsJuan H Fernandez, MD;  Location: [name];  Service: Gynecology;  Laterality: N/A;  . ESOPHAGOGASTRODUODENOSCOPY  01-14-2004   gastritis- dr Felicia [name]- in epic already   . NOSE SURGERY    . NOSE SURGERY    . SHOULDER SURGERY     rotator cuff repair   . TONSILLECTOMY    . TUBAL LIGATION    . UPPER GASTROINTESTINAL ENDOSCOPY  01-14-2004   in epic- dr Felicia [name]- gastritis    Social History   Occupational History  . Not on file  Tobacco Use  . Smoking status: Current Every Day Smoker    Packs/day: 0.25    Years: 30.00    Pack years: 7.50    Types: Cigarettes  . Smokeless tobacco: Never Used  Substance and Sexual Activity  . Alcohol use: Yes    Alcohol/week: 1.0 standard drinks    Types: 1 Shots of liquor per week     Comment: very occassionally on social occassions  . Drug use: No  . Sexual activity: Yes

## 2019-08-01 NOTE — Progress Notes (Signed)
Subjective: Patient is here for ultrasound-guided intra-articular left glenohumeral injection.  Pain and very diminished active and passive ROM.  Procedure: Ultrasound-guided left glenohumeral injection: After sterile prep with Betadine, injected 8 cc 1% lidocaine without epinephrine and 40 mg methylprednisolone using a 22-gauge spinal needle, passing the needle through approach into the glenohumeral joint.  Injectate seen filling joint capsule.  Pain improved, but ROM did not.  Follow up as scheduled.

## 2019-08-03 ENCOUNTER — Other Ambulatory Visit: Payer: Self-pay | Admitting: Family Medicine

## 2019-08-03 DIAGNOSIS — M5431 Sciatica, right side: Secondary | ICD-10-CM

## 2019-08-03 NOTE — Telephone Encounter (Signed)
Ok to refill 

## 2019-08-08 ENCOUNTER — Telehealth: Payer: Self-pay | Admitting: Orthopaedic Surgery

## 2019-08-08 NOTE — Telephone Encounter (Signed)
Katie with [facility] called. She needs the OT referral faxed over for patient. Her call back number is [phone] and the fax number is [phone]

## 2019-08-09 NOTE — Telephone Encounter (Signed)
Faxed

## 2019-08-20 ENCOUNTER — Other Ambulatory Visit: Payer: Self-pay | Admitting: Family Medicine

## 2019-08-21 NOTE — Telephone Encounter (Signed)
Requesting refill    Tramadol  LOV: 05/11/2019  LRF:  07/18/19

## 2019-08-30 ENCOUNTER — Encounter: Payer: Self-pay | Admitting: Orthopaedic Surgery

## 2019-08-30 ENCOUNTER — Ambulatory Visit: Payer: Self-pay

## 2019-08-30 ENCOUNTER — Ambulatory Visit (INDEPENDENT_AMBULATORY_CARE_PROVIDER_SITE_OTHER): Payer: Medicare Other | Admitting: Orthopaedic Surgery

## 2019-08-30 DIAGNOSIS — G8929 Other chronic pain: Secondary | ICD-10-CM

## 2019-08-30 DIAGNOSIS — M25512 Pain in left shoulder: Secondary | ICD-10-CM | POA: Diagnosis not present

## 2019-08-30 MED ORDER — HYDROCODONE-ACETAMINOPHEN 5-325 MG PO TABS: 1.0000 | ORAL_TABLET | Freq: Two times a day (BID) | ORAL | 0 refills | Status: DC | PRN

## 2019-08-30 MED ORDER — BUPIVACAINE HCL 0.25 % IJ SOLN
2.0000 mL | INTRAMUSCULAR | Status: AC | PRN
  Administered 2019-08-30: 2 mL via INTRA_ARTICULAR

## 2019-08-30 MED ORDER — ONDANSETRON HCL 4 MG PO TABS: 4.0000 mg | ORAL_TABLET | Freq: Three times a day (TID) | ORAL | 0 refills | Status: DC | PRN

## 2019-08-30 MED ORDER — METHYLPREDNISOLONE ACETATE 40 MG/ML IJ SUSP
40.0000 mg | INTRAMUSCULAR | Status: AC | PRN
  Administered 2019-08-30: 40 mg via INTRA_ARTICULAR

## 2019-08-30 MED ORDER — LIDOCAINE HCL 2 % IJ SOLN
2.0000 mL | INTRAMUSCULAR | Status: AC | PRN
  Administered 2019-08-30: 2 mL

## 2019-08-30 NOTE — Progress Notes (Signed)
Office Visit Note   Patient: Felicia [name]           Date of Birth: 06/06/1966           MRN: [id number] Visit Date: 08/30/2019              Requested by: Susy Frizzle, MD [address] [place],  [place] [address] PCP: Susy Frizzle, MD   Assessment & Plan: Visit Diagnoses:  1. Chronic left shoulder pain     Plan: impression is aggravation of chronic left shoulder pain and subscapularis tear.  We injected left shoulder with cortisone today.  She will take it easy for the next two weeks.  Follow up with [place] at that point for recheck.   Follow-Up Instructions: Return in about 2 weeks (around 09/13/2019).   Orders:  Orders Placed This Encounter  Procedures  . Large Joint Inj: L subacromial bursa  . XR Shoulder Left   Meds ordered this encounter  Medications  . HYDROcodone-acetaminophen (NORCO) 5-325 MG tablet    Sig: Take 1 tablet by mouth 2 (two) times daily as needed for moderate pain.    Dispense:  10 tablet    Refill:  0  . ondansetron (ZOFRAN) 4 MG tablet    Sig: Take 1 tablet (4 mg total) by mouth every 8 (eight) hours as needed for nausea or vomiting.    Dispense:  20 tablet    Refill:  0      Procedures: Large Joint Inj: L subacromial bursa on 08/30/2019 2:35 PM Indications: pain Details: 22 G needle Medications: 2 mL bupivacaine 0.25 %; 2 mL lidocaine 2 %; 40 mg methylPREDNISolone acetate 40 MG/ML Outcome: tolerated well, no immediate complications Patient was prepped and draped in the usual sterile fashion.       Clinical Data: No additional findings.   Subjective: Chief Complaint  Patient presents with  . Left Shoulder - Pain    HPI patient comes in with a new injury to the left shoulder.  As she was climbing up steps to get into her bed Saturday night, the stairs broke and she fell onto her left shoulder.  She has had marked pain since.  Worse with any motion of the shoulder. History of previous left shoulder rotator cuff repair  and chronic appearing subscapularis with atrophy seen on recent MRI.  She recently underwent glenohumeral cortisone injection with Dr. Junius Roads without significant relief.  She has also been in PT without significant relief.  This new injury has really aggravated her symptoms.    Review of Systems as detailed in HPI.  All others reviewed and are negative.     Objective: Vital Signs: LMP 12/26/2012   Physical Exam well developed and well nourished female in no acute distress.  Alert and oriented x 3.  Ortho Exam left shoulder has moderate tenderness throughout.  She has about 20 degrees of forward flexion and abduction.  She is neurovascularly intact distally.  Specialty Comments:  No specialty comments available.  Imaging: Xr Shoulder Left  Result Date: 08/30/2019 xrays show slight superior migration of the humeral  Head.  Slight resorption of the greater tuberosity possibly from previous rotator cuff surgery anchor.    PMFS History: Patient Active Problem List   Diagnosis Date Noted  . Partial tear of left subscapularis tendon 08/01/2019  . Low back strain 05/08/2015  . Asthma, chronic 05/08/2015  . Atherosclerosis of native artery of both lower extremities with intermittent claudication ([place])  10/23/2014  . PVD (peripheral vascular disease) with claudication ([facility]) 09/26/2014  . Atherosclerosis of native arteries of extremity with intermittent claudication ([facility]) 09/14/2012  . PUD (peptic ulcer disease)   . Hyperlipidemia    Past Medical History:  Diagnosis Date  . Allergy    year around  . Anemia   . Anxiety   . Arthritis   . Asthma   . Atrial fibrillation ([facility])   . Bipolar 1 disorder ([facility])   . Depression   . Ectopic pregnancy   . Headache   . Heart murmur   . Hyperlipidemia   . Insomnia   . Peripheral arterial disease ([facility])   . Pneumonia   . PUD (peptic ulcer disease)   . Right knee pain 12-05-2015   per pt.  she fell out of her husband's truck and landed on her  right knee  . Shortness of breath dyspnea     Family History  Adopted: Yes  Problem Relation Age of Onset  . Heart disease Mother   . Diabetes Mother   . Hyperlipidemia Mother   . Hypertension Mother   . Heart attack Mother   . Heart disease Father   . Diabetes Father   . Heart attack Father   . Hypertension Father   . Bladder Cancer Father   . Diabetes Brother   . Lung cancer Paternal Grandfather   . Colon cancer Neg Hx   . Colon polyps Neg Hx   . Rectal cancer Neg Hx   . Stomach cancer Neg Hx     Past Surgical History:  Procedure Laterality Date  . ABDOMINAL AORTAGRAM N/A 09/12/2012   Procedure: ABDOMINAL AORTAGRAM;  Surgeon: Fransisco Hertz, MD;  Location: [facility];  Service: Cardiovascular;  Laterality: N/A;  . AORTA - BILATERAL FEMORAL ARTERY BYPASS GRAFT N/A 10/23/2014   Procedure: AORTOBIFEMORAL BYPASS GRAFT;  Surgeon: Chuck Hint, MD;  Location: [facility];  Service: Vascular;  Laterality: N/A;  . BACK SURGERY    . CESAREAN SECTION    . COLONOSCOPY  01-14-2004   > 11 yr ago Dr Loreta [address] - normal - report in epic   . DILATATION & CURETTAGE/HYSTEROSCOPY WITH MYOSURE N/A 10/15/2015   Procedure: DILATATION & CURETTAGE/HYSTEROSCOPY WITH MYOSURE;  Surgeon: Ok Edwards, MD;  Location: [facility];  Service: Gynecology;  Laterality: N/A;  . ESOPHAGOGASTRODUODENOSCOPY  01-14-2004   gastritis- dr Loreta Ave- in epic already   . NOSE SURGERY    . NOSE SURGERY    . SHOULDER SURGERY     rotator cuff repair   . TONSILLECTOMY    . TUBAL LIGATION    . UPPER GASTROINTESTINAL ENDOSCOPY  01-14-2004   in epic- dr Loreta Ave- gastritis    Social History   Occupational History  . Not on file  Tobacco Use  . Smoking status: Current Every Day Smoker    Packs/day: 0.25    Years: 30.00    Pack years: 7.50    Types: Cigarettes  . Smokeless tobacco: Never Used  Substance and Sexual Activity  . Alcohol use: Yes    Alcohol/week: 1.0 standard drinks    Types: 1 Shots of liquor per week     Comment: very occassionally on social occassions  . Drug use: No  . Sexual activity: Yes

## 2019-09-05 ENCOUNTER — Ambulatory Visit: Payer: Medicare Other | Admitting: Orthopaedic Surgery

## 2019-09-07 ENCOUNTER — Telehealth: Payer: Self-pay | Admitting: Orthopaedic Surgery

## 2019-09-07 NOTE — Telephone Encounter (Signed)
Pt called in requesting a refill on hydrocodone, please have that sent to [facility] on [address]. Pt states she is still having a lot of pain and swelling on her left shoulder.   [phone]

## 2019-09-07 NOTE — Telephone Encounter (Signed)
See message.

## 2019-09-08 ENCOUNTER — Telehealth: Payer: Self-pay | Admitting: Orthopaedic Surgery

## 2019-09-08 ENCOUNTER — Other Ambulatory Visit: Payer: Self-pay

## 2019-09-08 MED ORDER — ACETAMINOPHEN-CODEINE #3 300-30 MG PO TABS: 1.0000 | ORAL_TABLET | Freq: Four times a day (QID) | ORAL | 0 refills | Status: DC | PRN

## 2019-09-08 NOTE — Telephone Encounter (Signed)
Called in Tylenol #3 for patient Patient aware

## 2019-09-08 NOTE — Telephone Encounter (Signed)
Cannot refill norco, but can write tramadol or tylenol 3

## 2019-09-08 NOTE — Telephone Encounter (Signed)
Patient called. She would like Tylenol #3 called in.

## 2019-09-08 NOTE — Telephone Encounter (Signed)
Please advise 

## 2019-09-11 ENCOUNTER — Other Ambulatory Visit: Payer: Self-pay | Admitting: Physician Assistant

## 2019-09-11 NOTE — Telephone Encounter (Signed)
Patient picked up Rx

## 2019-09-11 NOTE — Telephone Encounter (Signed)
Looks like was sent in on 10/16

## 2019-09-12 ENCOUNTER — Ambulatory Visit: Payer: Medicare Other | Admitting: Orthopaedic Surgery

## 2019-09-13 ENCOUNTER — Encounter: Payer: Self-pay | Admitting: Orthopaedic Surgery

## 2019-09-13 ENCOUNTER — Ambulatory Visit (INDEPENDENT_AMBULATORY_CARE_PROVIDER_SITE_OTHER): Payer: Medicare Other | Admitting: Orthopaedic Surgery

## 2019-09-13 ENCOUNTER — Other Ambulatory Visit: Payer: Self-pay

## 2019-09-13 VITALS — Ht <= 58 in | Wt 142.0 lb

## 2019-09-13 DIAGNOSIS — G8929 Other chronic pain: Secondary | ICD-10-CM

## 2019-09-13 DIAGNOSIS — M25512 Pain in left shoulder: Secondary | ICD-10-CM

## 2019-09-13 NOTE — Progress Notes (Signed)
Office Visit Note   Patient: Felicia [name]           Date of Birth: [birth year]           MRN: [id number] Visit Date: 09/13/2019              Requested by: Donita Brooks, MD [address] [place],  [place] [address] PCP: Donita Brooks, MD   Assessment & Plan: Visit Diagnoses:  1. Chronic left shoulder pain     Plan: Impression is chronic left shoulder pain.  She has a chronic subscapularis tear and associated atrophy of the muscle.  She has mainly tendinopathy of the remainder of the rotator cuff.  Patient unfortunately has not gotten any relief from conservative treatments.  Not quite sure if she would benefit from another shoulder scope with the intention of rotator cuff repair versus tendon transfer.  I would like to send Okey Regal to my partner Dr. August Saucer for his expertise in this matter.    Follow-Up Instructions: Return for needs appt with [facility] asap.   Orders:  No orders of the defined types were placed in this encounter.  No orders of the defined types were placed in this encounter.     Procedures: No procedures performed   Clinical Data: No additional findings.   Subjective: Chief Complaint  Patient presents with  . Left Shoulder - Pain, Follow-up    Veva returns today for follow-up of continued chronic left shoulder pain.  She states that the injection did not give her much relief.  She is status post left rotator cuff [place].     Review of Systems  Constitutional: Negative.   HENT: Negative.   Eyes: Negative.   Respiratory: Negative.   Cardiovascular: Negative.   Endocrine: Negative.   Musculoskeletal: Negative.   Neurological: Negative.   Hematological: Negative.   Psychiatric/Behavioral: Negative.   All other systems reviewed and are negative.    Objective: Vital Signs: Ht 4\' 9"  (1.448 m)   Wt 142 lb (64.4 kg)   LMP 12/26/2012   BMI 30.73 kg/m   Physical Exam Vitals signs and nursing note reviewed.   Constitutional:      Appearance: She is well-developed.  Pulmonary:     Effort: Pulmonary effort is normal.  Skin:    General: Skin is warm.     Capillary Refill: Capillary refill takes less than 2 seconds.  Neurological:     Mental Status: She is alert and oriented to person, place, and time.  Psychiatric:        Behavior: Behavior normal.        Thought Content: Thought content normal.        Judgment: Judgment normal.     Ortho Exam Left shoulder exam shows unable to lift off.  Moderately weak bearhug with pain.  Weak belly press with pain.  Pain with forward flexion and shoulder abduction.  Specialty Comments:  No specialty comments available.  Imaging: No results found.   PMFS History: Patient Active Problem List   Diagnosis Date Noted  . Chronic left shoulder pain 09/13/2019  . Partial tear of left subscapularis tendon 08/01/2019  . Low back strain 05/08/2015  . Asthma, chronic 05/08/2015  . Atherosclerosis of native artery of both lower extremities with intermittent claudication ([facility]) 10/23/2014  . PVD (peripheral vascular disease) with claudication ([facility]) 09/26/2014  . Atherosclerosis of native arteries of extremity with intermittent claudication ([facility]) 09/14/2012  . PUD (  peptic ulcer disease)   . Hyperlipidemia    Past Medical History:  Diagnosis Date  . Allergy    year around  . Anemia   . Anxiety   . Arthritis   . Asthma   . Atrial fibrillation ([place])   . Bipolar 1 disorder ([place])   . Depression   . Ectopic pregnancy   . Headache   . Heart murmur   . Hyperlipidemia   . Insomnia   . Peripheral arterial disease ([place])   . Pneumonia   . PUD (peptic ulcer disease)   . Right knee pain 12-05-2015   per pt.  she fell out of her husband's truck and landed on her right knee  . Shortness of breath dyspnea     Family History  Adopted: Yes  Problem Relation Age of Onset  . Heart disease Mother   . Diabetes Mother   . Hyperlipidemia Mother   .  Hypertension Mother   . Heart attack Mother   . Heart disease Father   . Diabetes Father   . Heart attack Father   . Hypertension Father   . Bladder Cancer Father   . Diabetes Brother   . Lung cancer Paternal Grandfather   . Colon cancer Neg Hx   . Colon polyps Neg Hx   . Rectal cancer Neg Hx   . Stomach cancer Neg Hx     Past Surgical History:  Procedure Laterality Date  . ABDOMINAL AORTAGRAM N/A 09/12/2012   Procedure: ABDOMINAL AORTAGRAM;  Surgeon: Conrad [place], MD;  Location: [facility] CATH LAB;  Service: Cardiovascular;  Laterality: N/A;  . AORTA - BILATERAL FEMORAL ARTERY BYPASS GRAFT N/A 10/23/2014   Procedure: AORTOBIFEMORAL BYPASS GRAFT;  Surgeon: Angelia Mould, MD;  Location: [facility];  Service: Vascular;  Laterality: N/A;  . BACK SURGERY    . CESAREAN SECTION    . COLONOSCOPY  01-14-2004   > 11 yr ago Dr Collene Mares - normal - report in epic   . DILATATION & CURETTAGE/HYSTEROSCOPY WITH MYOSURE N/A 10/15/2015   Procedure: DILATATION & CURETTAGE/HYSTEROSCOPY WITH MYOSURE;  Surgeon: Terrance Mass, MD;  Location: [facility];  Service: Gynecology;  Laterality: N/A;  . ESOPHAGOGASTRODUODENOSCOPY  01-14-2004   gastritis- dr Collene Mares- in epic already   . NOSE SURGERY    . NOSE SURGERY    . SHOULDER SURGERY     rotator cuff repair   . TONSILLECTOMY    . TUBAL LIGATION    . UPPER GASTROINTESTINAL ENDOSCOPY  01-14-2004   in epic- dr Collene Mares- gastritis    Social History   Occupational History  . Not on file  Tobacco Use  . Smoking status: Current Every Day Smoker    Packs/day: 0.25    Years: 30.00    Pack years: 7.50    Types: Cigarettes  . Smokeless tobacco: Never Used  Substance and Sexual Activity  . Alcohol use: Yes    Alcohol/week: 1.0 standard drinks    Types: 1 Shots of liquor per week    Comment: very occassionally on social occassions  . Drug use: No  . Sexual activity: Yes

## 2019-09-18 ENCOUNTER — Other Ambulatory Visit: Payer: Self-pay | Admitting: Family Medicine

## 2019-09-18 NOTE — Telephone Encounter (Signed)
Ok to refill??  Last office visit 05/11/2019.  Last refill 08/21/2019.

## 2019-09-20 ENCOUNTER — Telehealth: Payer: Self-pay | Admitting: Radiology

## 2019-09-20 ENCOUNTER — Ambulatory Visit: Payer: Medicare Other | Admitting: Orthopedic Surgery

## 2019-09-20 NOTE — Telephone Encounter (Signed)
I called patient to reschedule 10/28 appt with Dr. Marlou Sa. Patient requests refill on Tylenol #3 from Dr. Erlinda Hong as she has not seen Dr. Marlou Sa yet.  Appt with Dr. Marlou Sa scheduled for 09/22/2019.  Please call patient when medication called in. [phone]

## 2019-09-21 ENCOUNTER — Other Ambulatory Visit: Payer: Self-pay | Admitting: Physician Assistant

## 2019-09-21 MED ORDER — ACETAMINOPHEN-CODEINE #3 300-30 MG PO TABS: 1.0000 | ORAL_TABLET | Freq: Four times a day (QID) | ORAL | 0 refills | Status: DC | PRN

## 2019-09-21 NOTE — Telephone Encounter (Signed)
See message below °

## 2019-09-21 NOTE — Telephone Encounter (Signed)
Just sent in

## 2019-09-22 ENCOUNTER — Encounter: Payer: Self-pay | Admitting: Orthopedic Surgery

## 2019-09-22 ENCOUNTER — Other Ambulatory Visit: Payer: Self-pay

## 2019-09-22 ENCOUNTER — Ambulatory Visit (INDEPENDENT_AMBULATORY_CARE_PROVIDER_SITE_OTHER): Payer: Medicare Other | Admitting: Orthopedic Surgery

## 2019-09-22 VITALS — Ht <= 58 in | Wt 140.0 lb

## 2019-09-22 DIAGNOSIS — S4382XA Sprain of other specified parts of left shoulder girdle, initial encounter: Secondary | ICD-10-CM | POA: Diagnosis not present

## 2019-09-22 DIAGNOSIS — S46812A Strain of other muscles, fascia and tendons at shoulder and upper arm level, left arm, initial encounter: Secondary | ICD-10-CM

## 2019-09-22 MED ORDER — ACETAMINOPHEN-CODEINE #3 300-30 MG PO TABS: 1.0000 | ORAL_TABLET | Freq: Three times a day (TID) | ORAL | 0 refills | Status: DC | PRN

## 2019-09-22 NOTE — Progress Notes (Signed)
Office Visit Note   Patient: Felicia [name]           Date of Birth: 1966/08/03           MRN: [id number] Visit Date: 09/22/2019 Requested by: Susy Frizzle, MD [address] [place],  [place] [address] PCP: Susy Frizzle, MD  Subjective: Chief Complaint  Patient presents with  . Left Shoulder - Pain    HPI: Felicia [name] is a 53 year old patient with left shoulder pain.  She describes having surgery on that left shoulder 2008.  She has had several falls this year where she injured that left shoulder.  She is having pain in the trapezial region as well as the superior aspect of the shoulder and anteriorly.  She has had 5 surgeries on the right shoulder.  She reports some pain and weakness as well as pain radiating into the elbow region anteriorly.  MRI scan is reviewed.  Does show history of prior rotator cuff repair which looks generally to be intact.  Potentially a little bit of a small recurrent full-thickness tear anteriorly.  Subscapularis does have muscle atrophy but partial thickness tearing is present.  Biceps tendon is subluxated medially.              ROS: All systems reviewed are negative as they relate to the chief complaint within the history of present illness.  Patient denies  fevers or chills.   Assessment & Plan: Visit Diagnoses:  1. Partial tear of left subscapularis tendon, initial encounter     Plan: Impression is left subscapularis tendon partial undersurface thickness tearing with subluxation medially of the biceps long head.  Patient may also have some recurrent tearing of that supraspinatus.  Plan is left shoulder arthroscopy with biceps tendon release and debridement of the superior labrum with mini open rotator cuff repair including attention towards the subscapularis.  Risk and benefits are discussed include but limited to infection nerve vessel damage incomplete pain relief particularly of the posterior pain that she is describing.  Shoulder stiffness  also a possibility.  All questions answered.  Plan to use black abduction brace to help with shoulder range of motion postoperatively.  Follow-Up Instructions: No follow-ups on file.   Orders:  No orders of the defined types were placed in this encounter.  Meds ordered this encounter  Medications  . acetaminophen-codeine (TYLENOL #3) 300-30 MG tablet    Sig: Take 1 tablet by mouth every 8 (eight) hours as needed for moderate pain.    Dispense:  30 tablet    Refill:  0      Procedures: No procedures performed   Clinical Data: No additional findings.  Objective: Vital Signs: Ht 4\' 9"  (1.448 m)   Wt 140 lb (63.5 kg)   LMP 12/26/2012   BMI 30.30 kg/m   Physical Exam:   Constitutional: Patient appears well-developed HEENT:  Head: Normocephalic Eyes:EOM are normal Neck: Normal range of motion Cardiovascular: Normal rate Pulmonary/chest: Effort normal Neurologic: Patient is alert Skin: Skin is warm Psychiatric: Patient has normal mood and affect  t  Ortho Exam: Ortho exam demonstrates pretty reasonable subscap strength on the right 5 out of 5 on the left 5- out of 5 but generally symmetric.  Cuff strength is good internal and external rotation on the left and right-hand side at 5 out of 5.  Abduction strength also good.  Mild amount of crepitus anteriorly with internal/external rotation of that left arm.  No restriction of external  rotation of 50 degrees of abduction and passive external rotation is to about 60 degrees bilaterally.  Specialty Comments:  No specialty comments available.  Imaging: No results found.   PMFS History: Patient Active Problem List   Diagnosis Date Noted  . Chronic left shoulder pain 09/13/2019  . Partial tear of left subscapularis tendon 08/01/2019  . Low back strain 05/08/2015  . Asthma, chronic 05/08/2015  . Atherosclerosis of native artery of both lower extremities with intermittent claudication (HCC) 10/23/2014  . PVD (peripheral  vascular disease) with claudication (HCC) 09/26/2014  . Atherosclerosis of native arteries of extremity with intermittent claudication (HCC) 09/14/2012  . PUD (peptic ulcer disease)   . Hyperlipidemia    Past Medical History:  Diagnosis Date  . Allergy    year around  . Anemia   . Anxiety   . Arthritis   . Asthma   . Atrial fibrillation (HCC)   . Bipolar 1 disorder (HCC)   . Depression   . Ectopic pregnancy   . Headache   . Heart murmur   . Hyperlipidemia   . Insomnia   . Peripheral arterial disease (HCC)   . Pneumonia   . PUD (peptic ulcer disease)   . Right knee pain 12-05-2015   per pt.  she fell out of her husband's truck and landed on her right knee  . Shortness of breath dyspnea     Family History  Adopted: Yes  Problem Relation Age of Onset  . Heart disease Mother   . Diabetes Mother   . Hyperlipidemia Mother   . Hypertension Mother   . Heart attack Mother   . Heart disease Father   . Diabetes Father   . Heart attack Father   . Hypertension Father   . Bladder Cancer Father   . Diabetes Brother   . Lung cancer Paternal Grandfather   . Colon cancer Neg Hx   . Colon polyps Neg Hx   . Rectal cancer Neg Hx   . Stomach cancer Neg Hx     Past Surgical History:  Procedure Laterality Date  . ABDOMINAL AORTAGRAM N/A 09/12/2012   Procedure: ABDOMINAL AORTAGRAM;  Surgeon: Fransisco HertzBrian L Chen, MD;  [facility];  Service: Cardiovascular;  Laterality: N/A;  . AORTA - BILATERAL FEMORAL ARTERY BYPASS GRAFT N/A 10/23/2014   Procedure: AORTOBIFEMORAL BYPASS GRAFT;  Surgeon: Chuck Hinthristopher S Dickson, MD;  [facility];  Service: Vascular;  Laterality: N/A;  . BACK SURGERY    . CESAREAN SECTION    . COLONOSCOPY  01-14-2004   > 11 yr ago Dr Loreta AveMann - normal - report in epic   . DILATATION & CURETTAGE/HYSTEROSCOPY WITH MYOSURE N/A 10/15/2015   Procedure: DILATATION & CURETTAGE/HYSTEROSCOPY WITH MYOSURE;  Surgeon: Ok EdwardsJuan H Fernandez, MD;  Location: [facility] ORS;  Service: Gynecology;   Laterality: N/A;  . ESOPHAGOGASTRODUODENOSCOPY  01-14-2004   gastritis- dr Loreta Avemann- in epic already   . NOSE SURGERY    . NOSE SURGERY    . SHOULDER SURGERY     rotator cuff repair   . TONSILLECTOMY    . TUBAL LIGATION    . UPPER GASTROINTESTINAL ENDOSCOPY  01-14-2004   in epic- dr Loreta Avemann- gastritis    Social History   Occupational History  . Not on file  Tobacco Use  . Smoking status: Current Every Day Smoker    Packs/day: 0.25    Years: 30.00    Pack years: 7.50    Types: Cigarettes  . Smokeless tobacco: Never Used  Substance and Sexual Activity  . Alcohol use: Yes    Alcohol/week: 1.0 standard drinks    Types: 1 Shots of liquor per week    Comment: very occassionally on social occassions  . Drug use: No  . Sexual activity: Yes

## 2019-09-23 ENCOUNTER — Other Ambulatory Visit (HOSPITAL_COMMUNITY)
Admission: RE | Admit: 2019-09-23 | Discharge: 2019-09-23 | Disposition: A | Discharge status: Home / Self Care | Payer: Medicare Other | Source: Ambulatory Visit | Attending: Orthopedic Surgery | Admitting: Orthopedic Surgery

## 2019-09-23 DIAGNOSIS — Z01812 Encounter for preprocedural laboratory examination: Secondary | ICD-10-CM | POA: Diagnosis present

## 2019-09-23 DIAGNOSIS — Z20828 Contact with and (suspected) exposure to other viral communicable diseases: Secondary | ICD-10-CM | POA: Insufficient documentation

## 2019-09-23 LAB — SARS CORONAVIRUS 2 (TAT 6-24 HRS): SARS Coronavirus 2: NEGATIVE

## 2019-09-25 ENCOUNTER — Encounter (HOSPITAL_COMMUNITY): Payer: Self-pay | Admitting: *Deleted

## 2019-09-25 ENCOUNTER — Other Ambulatory Visit: Payer: Self-pay

## 2019-09-25 NOTE — Progress Notes (Signed)
Pt denies any acute pulmonary issues. Pt denies chest pain and being under the care of a cardiologist. Pt stated that her PCP, Dr. Elenora Fender, prescribes her heart medications. Pt denies having a cardiac cath. Pt denies having a chest x ray and EKG in the last year. Pt denies recent labs. Pt made aware to stop taking  Aspirin (unless otherwise advised by surgeon), vitamins, fish oil and herbal medications. Do not take any NSAIDs ie: Ibuprofen, Advil, Naproxen (Aleve), Motrin, BC and Goody Powder. Pt verbalized understanding  of all pre-op instructions.

## 2019-09-26 ENCOUNTER — Ambulatory Visit (HOSPITAL_COMMUNITY): Payer: Medicare Other | Admitting: Anesthesiology

## 2019-09-26 ENCOUNTER — Ambulatory Visit (HOSPITAL_COMMUNITY)
Admission: RE | Admit: 2019-09-26 | Discharge: 2019-09-26 | Disposition: A | Discharge status: Home / Self Care | Payer: Medicare Other | Attending: Orthopedic Surgery | Admitting: Orthopedic Surgery

## 2019-09-26 ENCOUNTER — Other Ambulatory Visit: Payer: Self-pay

## 2019-09-26 ENCOUNTER — Encounter (HOSPITAL_COMMUNITY): Payer: Self-pay | Admitting: Orthopedic Surgery

## 2019-09-26 ENCOUNTER — Encounter (HOSPITAL_COMMUNITY)
Admission: RE | Disposition: A | Discharge status: Home / Self Care | Payer: Self-pay | Source: Home / Self Care | Attending: Orthopedic Surgery

## 2019-09-26 DIAGNOSIS — Z8249 Family history of ischemic heart disease and other diseases of the circulatory system: Secondary | ICD-10-CM | POA: Insufficient documentation

## 2019-09-26 DIAGNOSIS — X58XXXA Exposure to other specified factors, initial encounter: Secondary | ICD-10-CM | POA: Insufficient documentation

## 2019-09-26 DIAGNOSIS — Z79899 Other long term (current) drug therapy: Secondary | ICD-10-CM | POA: Insufficient documentation

## 2019-09-26 DIAGNOSIS — F17210 Nicotine dependence, cigarettes, uncomplicated: Secondary | ICD-10-CM | POA: Diagnosis not present

## 2019-09-26 DIAGNOSIS — F319 Bipolar disorder, unspecified: Secondary | ICD-10-CM | POA: Insufficient documentation

## 2019-09-26 DIAGNOSIS — R7303 Prediabetes: Secondary | ICD-10-CM | POA: Diagnosis not present

## 2019-09-26 DIAGNOSIS — Z7982 Long term (current) use of aspirin: Secondary | ICD-10-CM | POA: Insufficient documentation

## 2019-09-26 DIAGNOSIS — K760 Fatty (change of) liver, not elsewhere classified: Secondary | ICD-10-CM | POA: Diagnosis not present

## 2019-09-26 DIAGNOSIS — I4891 Unspecified atrial fibrillation: Secondary | ICD-10-CM | POA: Insufficient documentation

## 2019-09-26 DIAGNOSIS — J45909 Unspecified asthma, uncomplicated: Secondary | ICD-10-CM | POA: Diagnosis not present

## 2019-09-26 DIAGNOSIS — I739 Peripheral vascular disease, unspecified: Secondary | ICD-10-CM | POA: Diagnosis not present

## 2019-09-26 DIAGNOSIS — F419 Anxiety disorder, unspecified: Secondary | ICD-10-CM | POA: Insufficient documentation

## 2019-09-26 DIAGNOSIS — M1990 Unspecified osteoarthritis, unspecified site: Secondary | ICD-10-CM | POA: Insufficient documentation

## 2019-09-26 DIAGNOSIS — Z8711 Personal history of peptic ulcer disease: Secondary | ICD-10-CM | POA: Insufficient documentation

## 2019-09-26 DIAGNOSIS — E785 Hyperlipidemia, unspecified: Secondary | ICD-10-CM | POA: Diagnosis not present

## 2019-09-26 DIAGNOSIS — S46812A Strain of other muscles, fascia and tendons at shoulder and upper arm level, left arm, initial encounter: Secondary | ICD-10-CM | POA: Diagnosis present

## 2019-09-26 DIAGNOSIS — S43432D Superior glenoid labrum lesion of left shoulder, subsequent encounter: Secondary | ICD-10-CM

## 2019-09-26 DIAGNOSIS — S46012D Strain of muscle(s) and tendon(s) of the rotator cuff of left shoulder, subsequent encounter: Secondary | ICD-10-CM

## 2019-09-26 DIAGNOSIS — M75102 Unspecified rotator cuff tear or rupture of left shoulder, not specified as traumatic: Secondary | ICD-10-CM | POA: Diagnosis not present

## 2019-09-26 HISTORY — DX: Presence of spectacles and contact lenses: Z97.3

## 2019-09-26 HISTORY — DX: Unspecified cataract: H26.9

## 2019-09-26 HISTORY — DX: Prediabetes: R73.03

## 2019-09-26 HISTORY — DX: Fatty (change of) liver, not elsewhere classified: K76.0

## 2019-09-26 HISTORY — DX: Presence of dental prosthetic device (complete) (partial): Z97.2

## 2019-09-26 HISTORY — PX: SHOULDER ARTHROSCOPY WITH SUBACROMIAL DECOMPRESSION, ROTATOR CUFF REPAIR AND BICEP TENDON REPAIR: SHX5687

## 2019-09-26 LAB — BASIC METABOLIC PANEL
Anion gap: 10 (ref 5–15)
BUN: 13 mg/dL (ref 6–20)
CO2: 25 mmol/L (ref 22–32)
Calcium: 8.6 mg/dL — ABNORMAL LOW (ref 8.9–10.3)
Chloride: 105 mmol/L (ref 98–111)
Creatinine, Ser: 0.81 mg/dL (ref 0.44–1.00)
GFR calc Af Amer: >60 mL/min (ref >60)
GFR calc non Af Amer: >60 mL/min (ref >60)
Glucose, Bld: 104 mg/dL — ABNORMAL HIGH (ref 70–99)
Potassium: 3.4 mmol/L — ABNORMAL LOW (ref 3.5–5.1)
Sodium: 140 mmol/L (ref 135–145)

## 2019-09-26 LAB — CBC
HCT: 38.6 % (ref 36.0–46.0)
Hemoglobin: 12.4 g/dL (ref 12.0–15.0)
MCH: 29.8 pg (ref 26.0–34.0)
MCHC: 32.1 g/dL (ref 30.0–36.0)
MCV: 92.8 fL (ref 80.0–100.0)
Platelets: 284 10*3/uL (ref 150–400)
RBC: 4.16 MIL/uL (ref 3.87–5.11)
RDW: 13.2 % (ref 11.5–15.5)
WBC: 6.2 10*3/uL (ref 4.0–10.5)
nRBC: 0 % (ref 0.0–0.2)

## 2019-09-26 SURGERY — SHOULDER ARTHROSCOPY WITH SUBACROMIAL DECOMPRESSION, ROTATOR CUFF REPAIR AND BICEP TENDON REPAIR
Anesthesia: General | Laterality: Left

## 2019-09-26 MED ORDER — VANCOMYCIN HCL IN DEXTROSE 1-5 GM/200ML-% IV SOLN
INTRAVENOUS | Status: AC
  Administered 2019-09-26: 1000 mg via INTRAVENOUS
  Filled 2019-09-26: qty 200

## 2019-09-26 MED ORDER — ACETAMINOPHEN 10 MG/ML IV SOLN
1000.0000 mg | Freq: Once | INTRAVENOUS | Status: AC
  Administered 2019-09-26: 1000 mg via INTRAVENOUS

## 2019-09-26 MED ORDER — ROCURONIUM BROMIDE 10 MG/ML (PF) SYRINGE
PREFILLED_SYRINGE | INTRAVENOUS | Status: DC | PRN
  Administered 2019-09-26: 50 mg via INTRAVENOUS

## 2019-09-26 MED ORDER — FENTANYL CITRATE (PF) 100 MCG/2ML IJ SOLN
INTRAMUSCULAR | Status: AC
  Administered 2019-09-26: 50 ug via INTRAVENOUS
  Filled 2019-09-26: qty 2

## 2019-09-26 MED ORDER — ACETAMINOPHEN 10 MG/ML IV SOLN
INTRAVENOUS | Status: AC
  Filled 2019-09-26: qty 100

## 2019-09-26 MED ORDER — PROPOFOL 10 MG/ML IV BOLUS
INTRAVENOUS | Status: AC
  Filled 2019-09-26: qty 20

## 2019-09-26 MED ORDER — LACTATED RINGERS IV SOLN
INTRAVENOUS | Status: DC
  Administered 2019-09-26 (×2): via INTRAVENOUS

## 2019-09-26 MED ORDER — ROCURONIUM BROMIDE 10 MG/ML (PF) SYRINGE
PREFILLED_SYRINGE | INTRAVENOUS | Status: AC
  Filled 2019-09-26: qty 10

## 2019-09-26 MED ORDER — VANCOMYCIN HCL 500 MG IV SOLR
INTRAVENOUS | Status: AC
  Filled 2019-09-26: qty 500

## 2019-09-26 MED ORDER — DEXAMETHASONE SODIUM PHOSPHATE 10 MG/ML IJ SOLN
INTRAMUSCULAR | Status: DC | PRN
  Administered 2019-09-26: 4 mg via INTRAVENOUS

## 2019-09-26 MED ORDER — PHENYLEPHRINE HCL-NACL 10-0.9 MG/250ML-% IV SOLN
INTRAVENOUS | Status: DC | PRN
  Administered 2019-09-26: 25 ug/min via INTRAVENOUS

## 2019-09-26 MED ORDER — LIDOCAINE 2% (20 MG/ML) 5 ML SYRINGE
INTRAMUSCULAR | Status: DC | PRN
  Administered 2019-09-26: 40 mg via INTRAVENOUS

## 2019-09-26 MED ORDER — FENTANYL CITRATE (PF) 250 MCG/5ML IJ SOLN
INTRAMUSCULAR | Status: DC | PRN
  Administered 2019-09-26: 25 ug via INTRAVENOUS
  Administered 2019-09-26: 100 ug via INTRAVENOUS

## 2019-09-26 MED ORDER — CHLORHEXIDINE GLUCONATE 4 % EX LIQD: 60.0000 mL | Freq: Once | CUTANEOUS | Status: DC

## 2019-09-26 MED ORDER — PROPOFOL 10 MG/ML IV BOLUS
INTRAVENOUS | Status: DC | PRN
  Administered 2019-09-26: 130 mg via INTRAVENOUS

## 2019-09-26 MED ORDER — BUPIVACAINE HCL (PF) 0.25 % IJ SOLN
INTRAMUSCULAR | Status: AC
  Filled 2019-09-26: qty 30

## 2019-09-26 MED ORDER — FENTANYL CITRATE (PF) 100 MCG/2ML IJ SOLN
50.0000 ug | Freq: Once | INTRAMUSCULAR | Status: AC
  Administered 2019-09-26: 12:00:00 50 ug via INTRAVENOUS

## 2019-09-26 MED ORDER — FENTANYL CITRATE (PF) 250 MCG/5ML IJ SOLN
INTRAMUSCULAR | Status: AC
  Filled 2019-09-26: qty 5

## 2019-09-26 MED ORDER — DEXAMETHASONE SODIUM PHOSPHATE 10 MG/ML IJ SOLN
INTRAMUSCULAR | Status: AC
  Filled 2019-09-26: qty 1

## 2019-09-26 MED ORDER — VANCOMYCIN HCL 500 MG IV SOLR
INTRAVENOUS | Status: DC | PRN
  Administered 2019-09-26: 500 mg via TOPICAL

## 2019-09-26 MED ORDER — MIDAZOLAM HCL 2 MG/2ML IJ SOLN
INTRAMUSCULAR | Status: AC
  Administered 2019-09-26: 1 mg via INTRAVENOUS
  Filled 2019-09-26: qty 2

## 2019-09-26 MED ORDER — VANCOMYCIN HCL IN DEXTROSE 1-5 GM/200ML-% IV SOLN
1000.0000 mg | INTRAVENOUS | Status: AC
  Administered 2019-09-26: 10:00:00 1000 mg via INTRAVENOUS

## 2019-09-26 MED ORDER — MIDAZOLAM HCL 2 MG/2ML IJ SOLN
1.0000 mg | Freq: Once | INTRAMUSCULAR | Status: AC
  Administered 2019-09-26: 12:00:00 1 mg via INTRAVENOUS

## 2019-09-26 MED ORDER — HYDROCODONE-ACETAMINOPHEN 5-325 MG PO TABS: 1.0000 | ORAL_TABLET | ORAL | 0 refills | Status: DC | PRN

## 2019-09-26 MED ORDER — SODIUM CHLORIDE 0.9 % IR SOLN
Status: DC | PRN
  Administered 2019-09-26: 1000 mL
  Administered 2019-09-26 (×2): 3000 mL
  Administered 2019-09-26 (×3): 1000 mL

## 2019-09-26 MED ORDER — SUGAMMADEX SODIUM 200 MG/2ML IV SOLN
INTRAVENOUS | Status: DC | PRN
  Administered 2019-09-26: 129.8 mg via INTRAVENOUS

## 2019-09-26 MED ORDER — ONDANSETRON HCL 4 MG/2ML IJ SOLN
INTRAMUSCULAR | Status: DC | PRN
  Administered 2019-09-26: 4 mg via INTRAVENOUS

## 2019-09-26 MED ORDER — LIDOCAINE 2% (20 MG/ML) 5 ML SYRINGE
INTRAMUSCULAR | Status: AC
  Filled 2019-09-26: qty 5

## 2019-09-26 MED ORDER — ONDANSETRON HCL 4 MG/2ML IJ SOLN
INTRAMUSCULAR | Status: AC
  Filled 2019-09-26: qty 2

## 2019-09-26 SURGICAL SUPPLY — 67 items
ANCHOR FBRTK 2.6 SUTURETAP 1.3 (Anchor) ×8 IMPLANT
ANCHOR SUT 1.8 FBRTK KNTLS 2SU (Anchor) ×4 IMPLANT
ANCHOR SUT BIO SW 4.75X19.1 (Anchor) ×8 IMPLANT
ANCHOR SUT BIOCOMP CORKSREW (Anchor) ×2 IMPLANT
BLADE EXCALIBUR 4.0X13 (MISCELLANEOUS) ×2 IMPLANT
BLADE SURG 11 STRL SS (BLADE) IMPLANT
CHLORAPREP W/TINT 26 (MISCELLANEOUS) ×2 IMPLANT
CLSR STERI-STRIP ANTIMIC 1/2X4 (GAUZE/BANDAGES/DRESSINGS) ×2 IMPLANT
COVER SURGICAL LIGHT HANDLE (MISCELLANEOUS) ×2 IMPLANT
DRAPE INCISE IOBAN 66X45 STRL (DRAPES) ×2 IMPLANT
DRAPE STERI 35X30 U-POUCH (DRAPES) ×2 IMPLANT
DRAPE U-SHAPE 47X51 STRL (DRAPES) ×4 IMPLANT
DRSG AQUACEL AG ADV 3.5X10 (GAUZE/BANDAGES/DRESSINGS) ×2 IMPLANT
DRSG TEGADERM 4X4.75 (GAUZE/BANDAGES/DRESSINGS) ×2 IMPLANT
DW OUTFLOW CASSETTE/TUBE SET (MISCELLANEOUS) ×2 IMPLANT
ELECT REM PT RETURN 9FT ADLT (ELECTROSURGICAL) ×2
ELECTRODE REM PT RTRN 9FT ADLT (ELECTROSURGICAL) ×1 IMPLANT
GAUZE SPONGE 4X4 12PLY STRL (GAUZE/BANDAGES/DRESSINGS) ×2 IMPLANT
GAUZE SPONGE 4X4 12PLY STRL LF (GAUZE/BANDAGES/DRESSINGS) ×2 IMPLANT
GAUZE SPONGE 4X4 16PLY XRAY LF (GAUZE/BANDAGES/DRESSINGS) ×2 IMPLANT
GLOVE BIOGEL PI IND STRL 7.0 (GLOVE) ×1 IMPLANT
GLOVE BIOGEL PI IND STRL 8 (GLOVE) ×1 IMPLANT
GLOVE BIOGEL PI INDICATOR 7.0 (GLOVE) ×1
GLOVE BIOGEL PI INDICATOR 8 (GLOVE) ×1
GLOVE ECLIPSE 7.0 STRL STRAW (GLOVE) ×2 IMPLANT
GLOVE SURG ORTHO 8.0 STRL STRW (GLOVE) ×2 IMPLANT
GOWN STRL REUS W/ TWL LRG LVL3 (GOWN DISPOSABLE) ×3 IMPLANT
GOWN STRL REUS W/TWL LRG LVL3 (GOWN DISPOSABLE) ×3
KIT BASIN OR (CUSTOM PROCEDURE TRAY) ×2 IMPLANT
KIT STR SPEAR 1.8 FBRTK DISP (KITS) ×2 IMPLANT
KIT TURNOVER KIT B (KITS) ×2 IMPLANT
MANIFOLD NEPTUNE II (INSTRUMENTS) ×2 IMPLANT
NDL SUT 6 .5 CRC .975X.05 MAYO (NEEDLE) ×1 IMPLANT
NEEDLE MAYO TAPER (NEEDLE) ×1
NEEDLE SCORPION MULTI FIRE (NEEDLE) ×2 IMPLANT
NEEDLE SPNL 18GX3.5 QUINCKE PK (NEEDLE) ×2 IMPLANT
NS IRRIG 1000ML POUR BTL (IV SOLUTION) ×2 IMPLANT
PACK SHOULDER (CUSTOM PROCEDURE TRAY) ×2 IMPLANT
PAD ABD 8X10 STRL (GAUZE/BANDAGES/DRESSINGS) ×2 IMPLANT
PAD ARMBOARD 7.5X6 YLW CONV (MISCELLANEOUS) ×4 IMPLANT
PENCIL BUTTON HOLSTER BLD 10FT (ELECTRODE) ×2 IMPLANT
PORT APPOLLO RF 90DEGREE MULTI (SURGICAL WAND) ×2 IMPLANT
RESTRAINT HEAD UNIVERSAL NS (MISCELLANEOUS) ×2 IMPLANT
SLING ARM IMMOBILIZER LRG (SOFTGOODS) ×2 IMPLANT
SPONGE LAP 4X18 RFD (DISPOSABLE) ×4 IMPLANT
STRIP CLOSURE SKIN 1/2X4 (GAUZE/BANDAGES/DRESSINGS) ×2 IMPLANT
SUCTION FRAZIER HANDLE 10FR (MISCELLANEOUS) ×1
SUCTION TUBE FRAZIER 10FR DISP (MISCELLANEOUS) ×1 IMPLANT
SUT ETHILON 3 0 PS 1 (SUTURE) ×2 IMPLANT
SUT FIBERWIRE #2 38 T-5 BLUE (SUTURE)
SUT MNCRL AB 3-0 PS2 18 (SUTURE) ×2 IMPLANT
SUT VIC AB 0 CT1 27 (SUTURE) ×2
SUT VIC AB 0 CT1 27XBRD ANBCTR (SUTURE) ×2 IMPLANT
SUT VIC AB 1 CT1 27 (SUTURE) ×4
SUT VIC AB 1 CT1 27XBRD ANBCTR (SUTURE) ×4 IMPLANT
SUT VIC AB 2-0 CT1 27 (SUTURE) ×2
SUT VIC AB 2-0 CT1 TAPERPNT 27 (SUTURE) ×2 IMPLANT
SUT VICRYL 0 UR6 27IN ABS (SUTURE) ×14 IMPLANT
SUT VICRYL 1 TIES 12X18 (SUTURE) ×2 IMPLANT
SUTURE FIBERWR #2 38 T-5 BLUE (SUTURE) IMPLANT
SUTURE TAPE 1.3 FIBERLOP 20 ST (SUTURE) IMPLANT
SUTURETAPE 1.3 FIBERLOOP 20 ST (SUTURE)
TAPE CLOTH SURG 6X10 WHT LF (GAUZE/BANDAGES/DRESSINGS) ×2 IMPLANT
TOWEL GREEN STERILE (TOWEL DISPOSABLE) ×2 IMPLANT
TOWEL GREEN STERILE FF (TOWEL DISPOSABLE) ×2 IMPLANT
TUBING ARTHROSCOPY IRRIG 16FT (MISCELLANEOUS) ×2 IMPLANT
WATER STERILE IRR 1000ML POUR (IV SOLUTION) ×2 IMPLANT

## 2019-09-26 NOTE — Anesthesia Preprocedure Evaluation (Signed)
Anesthesia Evaluation  Patient identified by MRN, date of birth, ID band Patient awake    Reviewed: Allergy & Precautions, NPO status , Patient's Chart, lab work & pertinent test results  Airway Mallampati: II  TM Distance: >3 FB Neck ROM: Full    Dental  (+) Edentulous Upper, Edentulous Lower   Pulmonary Current Smoker,    breath sounds clear to auscultation       Cardiovascular  Rhythm:Regular Rate:Normal     Neuro/Psych    GI/Hepatic   Endo/Other    Renal/GU      Musculoskeletal   Abdominal   Peds  Hematology   Anesthesia Other Findings   Reproductive/Obstetrics                             Anesthesia Physical Anesthesia Plan  ASA: II  Anesthesia Plan: General   Post-op Pain Management:  Regional for Post-op pain   Induction: Intravenous  PONV Risk Score and Plan: Ondansetron and Dexamethasone  Airway Management Planned: Oral ETT  Additional Equipment:   Intra-op Plan:   Post-operative Plan: Extubation in OR  Informed Consent: I have reviewed the patients History and Physical, chart, labs and discussed the procedure including the risks, benefits and alternatives for the proposed anesthesia with the patient or authorized representative who has indicated his/her understanding and acceptance.     Dental advisory given  Plan Discussed with:   Anesthesia Plan Comments:         Anesthesia Quick Evaluation

## 2019-09-26 NOTE — Op Note (Signed)
NAME: Felicia [name], Felicia [name] MEDICAL RECORD CH:[id number] ACCOUNT [id number] DATE OF BIRTH:Mar 02, 1966 FACILITY: [facility] LOCATION: [facility] PHYSICIAN:Tamla Winkels Diamantina Providence, MD  OPERATIVE REPORT  DATE OF PROCEDURE:  09/26/2019  PREOPERATIVE DIAGNOSES:   1.  Left shoulder subscapularis tear. 2.  Biceps tendon subluxation.  3.  Supraspinatus tendon tear.  POSTOPERATIVE DIAGNOSES:   1.  Left shoulder subscapularis tear. 2.  Biceps tendon subluxation.  3.  Supraspinatus tendon tear.  PROCEDURES:   1.  Left shoulder arthroscopy with biceps tendon release.  2.  Limited debridement of superior labrum as well as the rotator interval synovitis. 3.  Subsequent open subscapularis tendon repair, biceps tenodesis and supraspinatus tendon tear.  SURGEON:  Cammy Copa, MD  ASSISTANT:  Karenann Cai, PA.  INDICATIONS:  The patient  is a 53 year old female with left shoulder pain refractory to nonoperative management, who presents for operative management after explanation of risks and benefits.  PROCEDURE IN DETAIL:  The patient was brought to the operating room where general endotracheal anesthesia was induced.  Preoperative antibiotics administered.  Timeout was called.  Left shoulder was prescrubbed with alcohol and Betadine, allowed to air  dry, prepped with DuraPrep solution and draped in a sterile manner.  Ioban used to cover the operative field.  Timeout was called.  Posterior portal created 2 cm medial and inferior to the posterolateral margin of the acromion.  Diagnostic arthroscopy  was performed.  Anterior portal created under direct visualization.  Biceps tendon was definitely frayed and subluxated medially.  It was released.  Superior labrum was also debrided, as well as the rotator interval synovitis with the Arthrocare wand.   The patient also had a supraspinatus tendon tear with significant fraying of the edges.  This was debrided with a shaver.  The glenohumeral articular surfaces were  intact.  Anterior, inferior, posterior inferior glenohumeral ligaments intact.  At this  time, instruments were removed.  Posterior portal was closed.  Anterior portal was extended for a deltopectoral approach.  Cephalic vein mobilized laterally.  The biceps tendon was subluxated medially.  The bicipital groove was opened.  Biceps tendon was  then tenodesed under appropriate tension using 2 Arthrex SutureTaks.  This gave very secure fixation.  This was done just above the pec attachment.  Next, attention was directed towards the subscapularis tendon.  The rotator interval was opened.   Subscapularis tendon was essentially 90% torn.  It was detached from the lesser tuberosity using a 15 blade.  This area was then prepared.  Stay sutures were placed within the tendon.  Attention was then directed towards the supraspinatus tendon tear.   This was a prior repair.  Old sutures were removed.  The tendon quality was generally poor.  Four 0 Vicryl sutures were placed as traction sutures and 2 SutureTaks were placed at the junction of the tuberosity and humeral head.  The  tendon was then  pulled laterally.  The 8 suture limbs were placed through the tendon to obtain good secure fixation.  Both of these were then tied and split for mattress-type, net-type covering over that supraspinatus.  Then, they were tied down with 2 suture locks.   This gave very secure fixation.  Attention was then redirected towards the subscap.  Two Arthrex suture anchors with 4 tapes each were placed.  They were then passed through the tendon of the subscapularis.  Excursion was pretty reasonable on the  subscapularis.  They were tied and then crossed and then tacked down using SutureTaks, 1 in the  bicipital groove, the other in the metaphyseal region laterally.  This gave very secure repair.  Rotator interval was not really able to be closed due to the  tissue within the rotator interval being of poor quality.  The subscapularis was  repaired with the arm out at 45 degrees of external rotation.  Following this, thorough irrigation was performed.  The incision was then filled with vancomycin powder.   Deltopectoral approach was then closed using #1 Vicryl suture, followed by interrupted inverted 0 Vicryl suture, 2-0 Vicryl suture and a 3-0 Monocryl.  Aquacel dressing applied.  Shoulder immobilizer applied.  Luke's assistance was required at all times  for retraction, as well as mobilization of tissues, opening and closing.  His assistance was a medical necessity.  VN/NUANCE  D:09/26/2019 T:09/26/2019 JOB:[id number]

## 2019-09-26 NOTE — Transfer of Care (Signed)
Immediate Anesthesia Transfer of Care Note  Patient: Felicia [name]  Procedure(s) Performed: left shoulder arthroscopy, biceps release and tenodesis, mini open rotator cuff tear repair subscapularis and supraspinatus (Left )  Patient Location: PACU  Anesthesia Type:General and GA combined with regional for post-op pain  Level of Consciousness: awake, drowsy and patient cooperative  Airway & Oxygen Therapy: Patient Spontanous Breathing and Patient connected to face mask oxygen  Post-op Assessment: Report given to RN and Post -op Vital signs reviewed and stable  Post vital signs: Reviewed and stable  Last Vitals:  Vitals Value Taken Time  BP 144/53 09/26/19 1626  Temp 36.8 C 09/26/19 1626  Pulse 74 09/26/19 1627  Resp 22 09/26/19 1627  SpO2 100 % 09/26/19 1627  Vitals shown include unvalidated device data.  Last Pain:  Vitals:   09/26/19 1626  TempSrc:   PainSc: (P) Asleep      Patients Stated Pain Goal: 3 (86/76/19 5093)  Complications: No apparent anesthesia complications

## 2019-09-26 NOTE — H&P (Signed)
Felicia [name] is an 53 y.o. female.   Chief Complaint: Left shoulder pain HPI: Felicia [name] is a 53 year old patient with left shoulder pain.  She has some mild atrophy of these subscapularis muscle belly and medial subluxation of the biceps tendon.  The inferior portion of the subscapularis is intact on imaging.  Patient also has a partial-thickness to full-thickness tear of the leading edge of the supraspinatus.  She had surgery on that about 10 to 15 years ago.  Reports pain and some weakness in the left shoulder region.  Has failed conservative measures and presents now for operative management after explanation of risks and benefits.  Past Medical History:  Diagnosis Date  . Allergy    year around  . Anemia   . Anxiety   . Arthritis   . Asthma   . Atrial fibrillation (HCC)   . Bipolar 1 disorder (HCC)   . Borderline diabetes   . Depression   . Early cataract    right  . Ectopic pregnancy   . Fatty liver   . Headache   . Heart murmur   . Hyperlipidemia   . Insomnia   . Peripheral arterial disease (HCC)   . Pneumonia   . PUD (peptic ulcer disease)   . Right knee pain 12-05-2015   per pt.  she fell out of her husband's truck and landed on her right knee  . Shortness of breath dyspnea   . Wears glasses   . Wears partial dentures     Past Surgical History:  Procedure Laterality Date  . ABDOMINAL AORTAGRAM N/A 09/12/2012   Procedure: ABDOMINAL AORTAGRAM;  Surgeon: Fransisco HertzBrian L Chen, MD;  [facility];  Service: Cardiovascular;  Laterality: N/A;  . AORTA - BILATERAL FEMORAL ARTERY BYPASS GRAFT N/A 10/23/2014   Procedure: AORTOBIFEMORAL BYPASS GRAFT;  Surgeon: Chuck Hinthristopher S Dickson, MD;  [facility];  Service: Vascular;  Laterality: N/A;  . BACK SURGERY    . CESAREAN SECTION    . COLONOSCOPY  01-14-2004   > 11 yr ago Dr Loreta AveMann - normal - report in epic   . DILATATION & CURETTAGE/HYSTEROSCOPY WITH MYOSURE N/A 10/15/2015   Procedure: DILATATION & CURETTAGE/HYSTEROSCOPY WITH  MYOSURE;  Surgeon: Ok EdwardsJuan H Fernandez, MD;  Location: [facility] ORS;  Service: Gynecology;  Laterality: N/A;  . ESOPHAGOGASTRODUODENOSCOPY  01-14-2004   gastritis- dr Loreta Avemann- in epic already   . MULTIPLE TOOTH EXTRACTIONS    . NOSE SURGERY    . NOSE SURGERY    . SHOULDER SURGERY     rotator cuff repair   . TONSILLECTOMY    . TUBAL LIGATION    . UPPER GASTROINTESTINAL ENDOSCOPY  01-14-2004   in epic- dr Loreta Avemann- gastritis     Family History  Adopted: Yes  Problem Relation Age of Onset  . Heart disease Mother   . Diabetes Mother   . Hyperlipidemia Mother   . Hypertension Mother   . Heart attack Mother   . Heart disease Father   . Diabetes Father   . Heart attack Father   . Hypertension Father   . Bladder Cancer Father   . Diabetes Brother   . Lung cancer Paternal Grandfather   . Colon cancer Neg Hx   . Colon polyps Neg Hx   . Rectal cancer Neg Hx   . Stomach cancer Neg Hx    Social History:  reports that she has been smoking cigarettes. She has a 15.00 pack-year smoking history. She has never used smokeless tobacco.  She reports current alcohol use of about 1.0 standard drinks of alcohol per week. She reports that she does not use drugs.  Allergies:  Allergies  Allergen Reactions  . Bee Pollen Swelling and Other (See Comments)    Also ticks   . Bactrim [Sulfamethoxazole-Trimethoprim] Hives  . Cephalosporins Hives  . Doxycycline Hives  . Tetracyclines & Related Hives  . Other Itching    Nuts causes itching and hives not sure what type  . Augmentin [Amoxicillin-Pot Clavulanate] Diarrhea  . Codeine Nausea Only  . Reflex Pain Relief [Menthol] Rash    Muscle Rub    Medications Prior to Admission  Medication Sig Dispense Refill  . acetaminophen-codeine (TYLENOL #3) 300-30 MG tablet Take 1 tablet by mouth every 8 (eight) hours as needed for moderate pain. 30 tablet 0  . albuterol (VENTOLIN HFA) 108 (90 Base) MCG/ACT inhaler Inhale 2 puffs into the lungs every 6 (six) hours as needed for  wheezing or shortness of breath.    Marland Kitchen aspirin EC 81 MG tablet Take 81 mg by mouth daily.    Marland Kitchen atorvastatin (LIPITOR) 80 MG tablet TAKE 1 TABLET BY MOUTH EVERY DAY (Patient taking differently: Take 80 mg by mouth every evening. ) 90 tablet 3  . bismuth subsalicylate (PEPTO BISMOL) 262 MG/15ML suspension Take 30 mLs by mouth every 6 (six) hours as needed for indigestion or diarrhea or loose stools.    . clonazePAM (KLONOPIN) 1 MG tablet TAKE 1 TABLET BY MOUTH 3 (THREE) TIMES DAILY. (Patient taking differently: Take 1 mg by mouth 3 (three) times daily. ) 90 tablet 2  . cyclobenzaprine (FLEXERIL) 10 MG tablet TAKE 1 TABLET BY MOUTH THREE TIMES A DAY AS NEEDED FOR MUSCLE SPASM (Patient taking differently: Take 10 mg by mouth 3 (three) times daily as needed for muscle spasms. ) 90 tablet 1  . dicyclomine (BENTYL) 20 MG tablet TAKE 1 TABLET (20 MG TOTAL) BY MOUTH 3 (THREE) TIMES DAILY WITH MEALS AS NEEDED FOR SPASMS. 20 tablet 0  . diphenhydrAMINE (BENADRYL) 25 MG tablet Take 50 mg by mouth every 6 (six) hours as needed for allergies.    . Emollient (CARMEX EX) Apply 1 application topically daily as needed (lip cracking).    . metoprolol succinate (TOPROL-XL) 50 MG 24 hr tablet EVERY DAY (Patient taking differently: Take 50 mg by mouth daily. ) 90 tablet 1  . naproxen sodium (ALEVE) 220 MG tablet Take 440 mg by mouth 2 (two) times daily as needed (pain).    . ondansetron (ZOFRAN) 4 MG tablet Take 1 tablet (4 mg total) by mouth every 8 (eight) hours as needed for nausea or vomiting. 20 tablet 0  . traMADol (ULTRAM) 50 MG tablet TAKE 2 TABLETS BY MOUTH 3 TIMES A DAY (Patient taking differently: Take 100 mg by mouth 3 (three) times daily. ) 180 tablet 0  . amLODipine (NORVASC) 10 MG tablet Take 1 tablet (10 mg total) by mouth daily. (Patient not taking: Reported on 09/25/2019) 90 tablet 3  . EPIPEN 2-PAK 0.3 MG/0.3ML SOAJ injection USE AS DIRECTED (Patient taking differently: Inject 0.3 mg into the muscle as  needed for anaphylaxis. ) 2 Device 0  . hydrochlorothiazide (MICROZIDE) 12.5 MG capsule Take 1 capsule (12.5 mg total) by mouth daily. (Patient not taking: Reported on 09/25/2019) 90 capsule 3  . HYDROcodone-acetaminophen (NORCO) 5-325 MG tablet Take 1 tablet by mouth 2 (two) times daily as needed for moderate pain. (Patient not taking: Reported on 09/25/2019) 10 tablet 0  .  pantoprazole (PROTONIX) 40 MG tablet TAKE 1 TABLET BY MOUTH EVERY DAY (Patient not taking: Reported on 09/25/2019) 90 tablet 1    Results for orders placed or performed during the hospital encounter of 09/26/19 (from the past 48 hour(s))  CBC     Status: None   Collection Time: 09/26/19  9:05 AM  Result Value Ref Range   WBC 6.2 4.0 - 10.5 K/uL   RBC 4.16 3.87 - 5.11 MIL/uL   Hemoglobin 12.4 12.0 - 15.0 g/dL   HCT 38.6 36.0 - 46.0 %   MCV 92.8 80.0 - 100.0 fL   MCH 29.8 26.0 - 34.0 pg   MCHC 32.1 30.0 - 36.0 g/dL   RDW 13.2 11.5 - 15.5 %   Platelets 284 150 - 400 K/uL   nRBC 0.0 0.0 - 0.2 %    Comment: Performed at [facility], [place] [address] [place], [place] [address]  Basic metabolic panel     Status: Abnormal   Collection Time: 09/26/19  9:05 AM  Result Value Ref Range   Sodium 140 135 - 145 mmol/L   Potassium 3.4 (L) 3.5 - 5.1 mmol/L   Chloride 105 98 - 111 mmol/L   CO2 25 22 - 32 mmol/L   Glucose, Bld 104 (H) 70 - 99 mg/dL   BUN 13 6 - 20 mg/dL   Creatinine, Ser 0.81 0.44 - 1.00 mg/dL   Calcium 8.6 (L) 8.9 - 10.3 mg/dL   GFR calc non Af Amer >60 >60 mL/min   GFR calc Af Amer >60 >60 mL/min   Anion gap 10 5 - 15    Comment: Performed at [facility], [place] [address], [place],  [address]   No results found.  Review of Systems  Musculoskeletal: Positive for joint pain.  All other systems reviewed and are negative.   Blood pressure (!) 158/58, pulse 94, temperature 98.2 F (36.8 C), temperature source Oral, resp. rate 20, height 4\' 9"  (1.448 m), weight 64.9 kg, last menstrual period  12/26/2012, SpO2 99 %. Physical Exam  Constitutional: She appears well-developed.  HENT:  Head: Normocephalic.  Eyes: Pupils are equal, round, and reactive to light.  Neck: Normal range of motion.  Cardiovascular: Normal rate.  Respiratory: Effort normal.  Neurological: She is alert.  Skin: Skin is warm.  Psychiatric: She has a normal mood and affect.  Examination of the left upper extremity demonstrates 4-5 subscap strength on the left compared to 5 out of 5 on the right.  Pretty reasonable supraspinatus infraspinatus strength testing as well.  Does have pain radiating in the bicep region.  No discrete AC joint tenderness.  Not much in way of coarse grinding or crepitus with passive range of motion.  Patient has no restriction of external rotation of 15 degrees of abduction  Assessment/Plan Impression is partial-thickness to near full-thickness subscap tear with subluxation of the medial head of the biceps.  It also appears that the prior supraspinatus tear may have a partial-thickness tear.  Plan is arthroscopy with biceps tendon release and superior labral debridement followed by rotator cuff repair of the anterior supraspinatus as well as subscapularis.  Risk benefits are discussed include but limited to infection nerve vessel damage shoulder stiffness as well as failure of the repair.  All questions answered.  Plan for CPM machine postop and sling immobilization for probably 2 to 3 weeks.  Anderson Malta, MD 09/26/2019, 12:20 PM

## 2019-09-26 NOTE — Anesthesia Procedure Notes (Signed)
Anesthesia Regional Block: Interscalene brachial plexus block   Pre-Anesthetic Checklist: ,, timeout performed, Correct Patient, Correct Site, Correct Laterality, Correct Procedure, Correct Position, site marked, Risks and benefits discussed,  Surgical consent,  Pre-op evaluation,  At surgeon's request and post-op pain management  Laterality: Left  Prep: chloraprep       Needles:  Injection technique: Single-shot  Needle Type: Stimulator Needle - 40      Needle Gauge: 22     Additional Needles:   Procedures:, nerve stimulator,,,,,,,  Narrative:  Start time: 09/26/2019 11:30 AM End time: 09/26/2019 11:35 AM Injection made incrementally with aspirations every 5 mL.  Performed by: Personally   Additional Notes: 15 cc 0.5% Bupivacaine with 1:20 epi 10 cc 1.3% Exparel injected easily

## 2019-09-26 NOTE — Anesthesia Procedure Notes (Signed)
Procedure Name: Intubation Date/Time: 09/26/2019 1:09 PM Performed by: Renato Shin, CRNA Pre-anesthesia Checklist: Patient identified, Emergency Drugs available, Suction available and Patient being monitored Patient Re-evaluated:Patient Re-evaluated prior to induction Oxygen Delivery Method: Circle system utilized Preoxygenation: Pre-oxygenation with 100% oxygen Induction Type: IV induction Ventilation: Mask ventilation without difficulty Laryngoscope Size: Miller and 2 Grade View: Grade I Tube type: Oral Tube size: 7.0 mm Number of attempts: 1 Airway Equipment and Method: Stylet and Oral airway Placement Confirmation: ETT inserted through vocal cords under direct vision,  positive ETCO2 and breath sounds checked- equal and bilateral Secured at: 20 cm Tube secured with: Tape Dental Injury: Teeth and Oropharynx as per pre-operative assessment

## 2019-09-26 NOTE — Anesthesia Postprocedure Evaluation (Signed)
Anesthesia Post Note  Patient: Felicia [name]  Procedure(s) Performed: left shoulder arthroscopy, biceps release and tenodesis, mini open rotator cuff tear repair subscapularis and supraspinatus (Left )     Patient location during evaluation: PACU Anesthesia Type: General Level of consciousness: awake and alert and oriented Pain management: satisfactory to patient Vital Signs Assessment: post-procedure vital signs reviewed and stable Respiratory status: spontaneous breathing, nonlabored ventilation, respiratory function stable and patient connected to nasal cannula oxygen Cardiovascular status: blood pressure returned to baseline and stable Postop Assessment: no apparent nausea or vomiting Anesthetic complications: no    Last Vitals:  Vitals:   09/26/19 1643 09/26/19 1656  BP: (!) 132/59 127/70  Pulse: 74 74  Resp: 16 19  Temp: 36.7 C 36.7 C  SpO2: 91% 90%    Last Pain:  Vitals:   09/26/19 1656  TempSrc:   PainSc: 1                  Nijee Heatwole COKER

## 2019-09-26 NOTE — Brief Op Note (Signed)
09/26/2019   09/26/2019  4:02 PM  PATIENT:  Felicia [name]  53 y.o. female  PRE-OPERATIVE DIAGNOSIS:  Partial tear of left subscapularis tendon Supraspinatus tendon tear and biceps tendon subluxation   POST-OPERATIVE DIAGNOSIS: 90% partial tear of left subscapularis tendon with biceps tendon subluxation and full-thickness supraspinatus tear    PROCEDURE:  Procedure(s): left shoulder arthroscopy, biceps release and tenodesis, mini open rotator cuff tear repair subscapularis and supraspinatus  SURGEON:  Surgeon(s): Marlou Sa, Tonna Corner, MD  ASSISTANT: Annie Main ANESTHESIA:   general  EBL: 150 ml    Total I/O In: 1000 [I.V.:1000] Out: 200 [Blood:200]  BLOOD ADMINISTERED: none  DRAINS: none   LOCAL MEDICATIONS USED:  none  SPECIMEN:  No Specimen  COUNTS:  YES  TOURNIQUET:  * No tourniquets in log *  DICTATION: .Other Dictation: Dictation Number [id number]  PLAN OF CARE: Discharge to home after PACU  PATIENT DISPOSITION:  PACU - hemodynamically stable

## 2019-09-27 ENCOUNTER — Encounter (HOSPITAL_COMMUNITY): Payer: Self-pay | Admitting: Orthopedic Surgery

## 2019-09-28 ENCOUNTER — Telehealth: Payer: Self-pay | Admitting: Orthopedic Surgery

## 2019-09-28 ENCOUNTER — Encounter (HOSPITAL_COMMUNITY): Payer: Self-pay | Admitting: Orthopedic Surgery

## 2019-09-28 NOTE — Telephone Encounter (Signed)
Please advise. Thanks.  

## 2019-09-28 NOTE — Telephone Encounter (Signed)
Called and spoke with patient.  Patient is in moderate to severe amount of pain. Discussed postoperative pain and how to use the CPM machine. Recommended she take 2 Norco 5mg  pills every 4 hours at most and add Aleve to pain management regimen.  Also recommended heating and icing modalities.  Patient agreed with plan and will call office with any further issues.

## 2019-09-28 NOTE — Telephone Encounter (Signed)
Pt called in said she just had surgery with dr.dean 09/26/2019 and was given hydrocodone as pain medication but pt states the medication is not helping her pain at all and she tried her pump for her shoulder which she was suppose to do for about 67mins but could only do 77mins due to the pain, pt states the pain is at the point where she is crying and cant be comfortable at all and she's wondering what should she do to help the pain or if there is something else you can do for her.  [phone]

## 2019-09-29 ENCOUNTER — Telehealth: Payer: Self-pay | Admitting: Orthopedic Surgery

## 2019-09-29 ENCOUNTER — Other Ambulatory Visit: Payer: Self-pay | Admitting: Surgical

## 2019-09-29 ENCOUNTER — Telehealth: Payer: Self-pay | Admitting: Surgical

## 2019-09-29 MED ORDER — HYDROCODONE-ACETAMINOPHEN 5-325 MG PO TABS: 1.0000 | ORAL_TABLET | ORAL | 0 refills | Status: DC | PRN

## 2019-09-29 NOTE — Telephone Encounter (Signed)
Please advise. Thanks.  

## 2019-09-29 NOTE — Telephone Encounter (Signed)
Pt called in requesting a call back with directions on how to properly remove her dressing and when.   [phone]

## 2019-09-29 NOTE — Telephone Encounter (Signed)
Ok for her to remove bandage?

## 2019-09-29 NOTE — Telephone Encounter (Signed)
IC s/w patient and advised  

## 2019-09-29 NOTE — Telephone Encounter (Signed)
Patient called needing Rx refilled Hydrocodone. The number to contact patient is [phone]   Mailing medical release form to patient per her request.

## 2019-10-04 ENCOUNTER — Other Ambulatory Visit: Payer: Self-pay | Admitting: Family Medicine

## 2019-10-04 ENCOUNTER — Ambulatory Visit (INDEPENDENT_AMBULATORY_CARE_PROVIDER_SITE_OTHER): Payer: Medicare Other | Admitting: Orthopedic Surgery

## 2019-10-04 ENCOUNTER — Other Ambulatory Visit: Payer: Self-pay

## 2019-10-04 DIAGNOSIS — M5431 Sciatica, right side: Secondary | ICD-10-CM

## 2019-10-04 DIAGNOSIS — M75102 Unspecified rotator cuff tear or rupture of left shoulder, not specified as traumatic: Secondary | ICD-10-CM

## 2019-10-04 DIAGNOSIS — S4382XD Sprain of other specified parts of left shoulder girdle, subsequent encounter: Secondary | ICD-10-CM

## 2019-10-04 DIAGNOSIS — S46812D Strain of other muscles, fascia and tendons at shoulder and upper arm level, left arm, subsequent encounter: Secondary | ICD-10-CM

## 2019-10-05 ENCOUNTER — Other Ambulatory Visit: Payer: Self-pay | Admitting: Family Medicine

## 2019-10-05 NOTE — Telephone Encounter (Signed)
Last office visit: 05/11/2019 Last refilled: 08/04/2019

## 2019-10-06 ENCOUNTER — Encounter: Payer: Self-pay | Admitting: Orthopedic Surgery

## 2019-10-06 NOTE — Progress Notes (Signed)
Post-Op Visit Note   Patient: Felicia [name]           Date of Birth: [birth year]           MRN: [id number] Visit Date: 10/04/2019 PCP: Donita Brooks, MD   Assessment & Plan:  Chief Complaint:  Chief Complaint  Patient presents with  . Left Shoulder - Follow-up  . Follow-up   Visit Diagnoses:  1. Partial tear of subscapularis tendon, left, subsequent encounter   2. Tear of left supraspinatus tendon     Plan: Patient is a 53 year old female who presents s/p left shoulder arthroscopy with biceps tenodesis and rotator cuff repair of the subscapularis and supraspinatus tendons on 09/26/2019.  Patient has remained in sling since surgery.  She has been taking pain medication every 4-5 hours as needed.  She is using the CPM machine 3 times a day for 90 minutes total and she is up to 90 degrees on CPM machine.  On exam she has 90 degrees of forward flexion and 75 degrees of abduction passively.  She has good supraspinatus strength with decent subscapularis strength.  Her incisions are healing well and she denies any fevers or chills.  Instructed patient to hold off on lifting anything with the operative arm.  Continue using the sling over the next 2 weeks and continue the CPM machine over the next 2 weeks.  Patient will return in 2 weeks for clinical recheck and we will likely DC the sling at that time.  Patient agrees with plan.  Follow-Up Instructions: No follow-ups on file.   Orders:  No orders of the defined types were placed in this encounter.  No orders of the defined types were placed in this encounter.   Imaging: No results found.  PMFS History: Patient Active Problem List   Diagnosis Date Noted  . Chronic left shoulder pain 09/13/2019  . Partial tear of left subscapularis tendon 08/01/2019  . Low back strain 05/08/2015  . Asthma, chronic 05/08/2015  . Atherosclerosis of native artery of both lower extremities with intermittent claudication (HCC) 10/23/2014  . PVD  (peripheral vascular disease) with claudication (HCC) 09/26/2014  . Atherosclerosis of native arteries of extremity with intermittent claudication (HCC) 09/14/2012  . PUD (peptic ulcer disease)   . Hyperlipidemia    Past Medical History:  Diagnosis Date  . Allergy    year around  . Anemia   . Anxiety   . Arthritis   . Asthma   . Atrial fibrillation (HCC)   . Bipolar 1 disorder (HCC)   . Borderline diabetes   . Depression   . Early cataract    right  . Ectopic pregnancy   . Fatty liver   . Headache   . Heart murmur   . Hyperlipidemia   . Insomnia   . Peripheral arterial disease (HCC)   . Pneumonia   . PUD (peptic ulcer disease)   . Right knee pain 12-05-2015   per pt.  she fell out of her husband's truck and landed on her right knee  . Shortness of breath dyspnea   . Wears glasses   . Wears partial dentures     Family History  Adopted: Yes  Problem Relation Age of Onset  . Heart disease Mother   . Diabetes Mother   . Hyperlipidemia Mother   . Hypertension Mother   . Heart attack Mother   . Heart disease Father   . Diabetes Father   . Heart attack Father   .  Hypertension Father   . Bladder Cancer Father   . Diabetes Brother   . Lung cancer Paternal Grandfather   . Colon cancer Neg Hx   . Colon polyps Neg Hx   . Rectal cancer Neg Hx   . Stomach cancer Neg Hx     Past Surgical History:  Procedure Laterality Date  . ABDOMINAL AORTAGRAM N/A 09/12/2012   Procedure: ABDOMINAL AORTAGRAM;  Surgeon: Conrad Grady, MD;  Location: [facility] CATH LAB;  Service: Cardiovascular;  Laterality: N/A;  . AORTA - BILATERAL FEMORAL ARTERY BYPASS GRAFT N/A 10/23/2014   Procedure: AORTOBIFEMORAL BYPASS GRAFT;  Surgeon: Angelia Mould, MD;  Location: [place];  Service: Vascular;  Laterality: N/A;  . BACK SURGERY    . CESAREAN SECTION    . COLONOSCOPY  01-14-2004   > 11 yr ago Dr Collene Mares - normal - report in epic   . DILATATION & CURETTAGE/HYSTEROSCOPY WITH MYOSURE N/A 10/15/2015    Procedure: DILATATION & CURETTAGE/HYSTEROSCOPY WITH MYOSURE;  Surgeon: Terrance Mass, MD;  Location: [facility];  Service: Gynecology;  Laterality: N/A;  . ESOPHAGOGASTRODUODENOSCOPY  01-14-2004   gastritis- dr Collene Mares- in epic already   . MULTIPLE TOOTH EXTRACTIONS    . NOSE SURGERY    . NOSE SURGERY    . SHOULDER ARTHROSCOPY WITH SUBACROMIAL DECOMPRESSION, ROTATOR CUFF REPAIR AND BICEP TENDON REPAIR Left 09/26/2019   Procedure: left shoulder arthroscopy, biceps release and tenodesis, mini open rotator cuff tear repair subscapularis and supraspinatus;  Surgeon: Meredith Pel, MD;  Location: [place];  Service: Orthopedics;  Laterality: Left;  . SHOULDER SURGERY     rotator cuff repair   . TONSILLECTOMY    . TUBAL LIGATION    . UPPER GASTROINTESTINAL ENDOSCOPY  01-14-2004   in epic- dr Collene Mares- gastritis    Social History   Occupational History  . Not on file  Tobacco Use  . Smoking status: Current Every Day Smoker    Packs/day: 0.50    Years: 30.00    Pack years: 15.00    Types: Cigarettes  . Smokeless tobacco: Never Used  Substance and Sexual Activity  . Alcohol use: Yes    Alcohol/week: 1.0 standard drinks    Types: 1 Shots of liquor per week    Comment: very occassionally on social occassions  . Drug use: No  . Sexual activity: Yes

## 2019-10-06 NOTE — Telephone Encounter (Signed)
Ok to refill??  Last office visit 05/11/2019.  Last refill 07/05/2019, #2 refills.

## 2019-10-11 ENCOUNTER — Telehealth: Payer: Self-pay | Admitting: Orthopedic Surgery

## 2019-10-11 NOTE — Telephone Encounter (Signed)
Patient called advised she is having pain under her arm and in her back. Patient said she have 2 Hydrocodone left and need a refill. The number to contact patient is [phone]

## 2019-10-11 NOTE — Telephone Encounter (Signed)
Please advise. Thanks.  

## 2019-10-12 ENCOUNTER — Other Ambulatory Visit: Payer: Self-pay | Admitting: Surgical

## 2019-10-12 MED ORDER — HYDROCODONE-ACETAMINOPHEN 5-325 MG PO TABS: 1.0000 | ORAL_TABLET | Freq: Three times a day (TID) | ORAL | 0 refills | Status: DC | PRN

## 2019-10-12 NOTE — Telephone Encounter (Signed)
Submitted refill to patient's pharmacy

## 2019-10-18 ENCOUNTER — Telehealth: Payer: Self-pay | Admitting: Orthopedic Surgery

## 2019-10-18 ENCOUNTER — Ambulatory Visit: Payer: Medicare Other | Admitting: Orthopedic Surgery

## 2019-10-18 NOTE — Telephone Encounter (Signed)
Pt called in requesting a refill on Hydrocodone, please have that sent to [facility] at [address] =.    [phone]

## 2019-10-18 NOTE — Telephone Encounter (Signed)
Please advise. Patient was scheduled to come in for appt today but looks like she cancelled.

## 2019-10-23 ENCOUNTER — Encounter: Payer: Self-pay | Admitting: Orthopedic Surgery

## 2019-10-23 ENCOUNTER — Other Ambulatory Visit: Payer: Self-pay | Admitting: Surgical

## 2019-10-23 ENCOUNTER — Other Ambulatory Visit: Payer: Self-pay

## 2019-10-23 ENCOUNTER — Ambulatory Visit (INDEPENDENT_AMBULATORY_CARE_PROVIDER_SITE_OTHER): Payer: Medicare Other | Admitting: Orthopedic Surgery

## 2019-10-23 DIAGNOSIS — S46812D Strain of other muscles, fascia and tendons at shoulder and upper arm level, left arm, subsequent encounter: Secondary | ICD-10-CM

## 2019-10-23 DIAGNOSIS — M75102 Unspecified rotator cuff tear or rupture of left shoulder, not specified as traumatic: Secondary | ICD-10-CM

## 2019-10-23 DIAGNOSIS — S4382XD Sprain of other specified parts of left shoulder girdle, subsequent encounter: Secondary | ICD-10-CM

## 2019-10-23 MED ORDER — HYDROCODONE-ACETAMINOPHEN 5-325 MG PO TABS: 1.0000 | ORAL_TABLET | Freq: Two times a day (BID) | ORAL | 0 refills | Status: DC | PRN

## 2019-10-23 NOTE — Progress Notes (Signed)
Post-Op Visit Note   Patient: Felicia [name]           Date of Birth: 11-11-1966           MRN: [id number] Visit Date: 10/23/2019 PCP: Donita Brooks, MD   Assessment & Plan:  Chief Complaint:  Chief Complaint  Patient presents with  . Follow-up   Visit Diagnoses:  1. Partial tear of subscapularis tendon, left, subsequent encounter   2. Tear of left supraspinatus tendon     Plan: Patient is a 53 year old female who presents s/p left shoulder arthroscopy with biceps tenodesis and subscap/supraspinatus rotator cuff repair on 09/26/2019.  Patient states that she is doing better since her last office visit.  She is using the CPM machine 2 times a day and is up to 90 degrees on the CPM.  She is taking Norco 5 mg 2-3 times a day for pain control.  She has run out of her pain medication.  On exam she has forward flexion to 100 degrees, abduction to 80 degrees, external rotation to 30 degrees.  External rotation is equivalent to the contralateral side.  She has good subscapularis and supraspinatus strength on exam.  Incisions are healing well.  She is using a sling occasionally and I think that she is good to DC the sling.  She will begin outpatient physical therapy to focus on left shoulder range of motion.  She will begin strengthening and physical therapy in 2 weeks.  Patient will follow up with the office in 4 weeks for clinical recheck.  Follow-Up Instructions: No follow-ups on file.   Orders:  No orders of the defined types were placed in this encounter.  No orders of the defined types were placed in this encounter.   Imaging: No results found.  PMFS History: Patient Active Problem List   Diagnosis Date Noted  . Chronic left shoulder pain 09/13/2019  . Partial tear of left subscapularis tendon 08/01/2019  . Low back strain 05/08/2015  . Asthma, chronic 05/08/2015  . Atherosclerosis of native artery of both lower extremities with intermittent claudication (HCC) 10/23/2014   . PVD (peripheral vascular disease) with claudication (HCC) 09/26/2014  . Atherosclerosis of native arteries of extremity with intermittent claudication (HCC) 09/14/2012  . PUD (peptic ulcer disease)   . Hyperlipidemia    Past Medical History:  Diagnosis Date  . Allergy    year around  . Anemia   . Anxiety   . Arthritis   . Asthma   . Atrial fibrillation (HCC)   . Bipolar 1 disorder (HCC)   . Borderline diabetes   . Depression   . Early cataract    right  . Ectopic pregnancy   . Fatty liver   . Headache   . Heart murmur   . Hyperlipidemia   . Insomnia   . Peripheral arterial disease (HCC)   . Pneumonia   . PUD (peptic ulcer disease)   . Right knee pain 12-05-2015   per pt.  she fell out of her husband's truck and landed on her right knee  . Shortness of breath dyspnea   . Wears glasses   . Wears partial dentures     Family History  Adopted: Yes  Problem Relation Age of Onset  . Heart disease Mother   . Diabetes Mother   . Hyperlipidemia Mother   . Hypertension Mother   . Heart attack Mother   . Heart disease Father   . Diabetes Father   . Heart  attack Father   . Hypertension Father   . Bladder Cancer Father   . Diabetes Brother   . Lung cancer Paternal Grandfather   . Colon cancer Neg Hx   . Colon polyps Neg Hx   . Rectal cancer Neg Hx   . Stomach cancer Neg Hx     Past Surgical History:  Procedure Laterality Date  . ABDOMINAL AORTAGRAM N/A 09/12/2012   Procedure: ABDOMINAL AORTAGRAM;  Surgeon: Conrad [place]ville, MD;  Location: [facility] CATH LAB;  Service: Cardiovascular;  Laterality: N/A;  . AORTA - BILATERAL FEMORAL ARTERY BYPASS GRAFT N/A 10/23/2014   Procedure: AORTOBIFEMORAL BYPASS GRAFT;  Surgeon: Angelia Mould, MD;  Location: [place];  Service: Vascular;  Laterality: N/A;  . BACK SURGERY    . CESAREAN SECTION    . COLONOSCOPY  01-14-2004   > 11 yr ago Dr Collene Mares - normal - report in epic   . DILATATION & CURETTAGE/HYSTEROSCOPY WITH MYOSURE N/A 10/15/2015    Procedure: DILATATION & CURETTAGE/HYSTEROSCOPY WITH MYOSURE;  Surgeon: Terrance Mass, MD;  Location: [facility];  Service: Gynecology;  Laterality: N/A;  . ESOPHAGOGASTRODUODENOSCOPY  01-14-2004   gastritis- dr Collene Mares- in epic already   . MULTIPLE TOOTH EXTRACTIONS    . NOSE SURGERY    . NOSE SURGERY    . SHOULDER ARTHROSCOPY WITH SUBACROMIAL DECOMPRESSION, ROTATOR CUFF REPAIR AND BICEP TENDON REPAIR Left 09/26/2019   Procedure: left shoulder arthroscopy, biceps release and tenodesis, mini open rotator cuff tear repair subscapularis and supraspinatus;  Surgeon: Meredith Pel, MD;  Location: [place];  Service: Orthopedics;  Laterality: Left;  . SHOULDER SURGERY     rotator cuff repair   . TONSILLECTOMY    . TUBAL LIGATION    . UPPER GASTROINTESTINAL ENDOSCOPY  01-14-2004   in epic- dr Collene Mares- gastritis    Social History   Occupational History  . Not on file  Tobacco Use  . Smoking status: Current Every Day Smoker    Packs/day: 0.50    Years: 30.00    Pack years: 15.00    Types: Cigarettes  . Smokeless tobacco: Never Used  Substance and Sexual Activity  . Alcohol use: Yes    Alcohol/week: 1.0 standard drinks    Types: 1 Shots of liquor per week    Comment: very occassionally on social occassions  . Drug use: No  . Sexual activity: Yes

## 2019-11-06 ENCOUNTER — Other Ambulatory Visit: Payer: Self-pay | Admitting: Family Medicine

## 2019-11-06 NOTE — Telephone Encounter (Signed)
Ok to refill? ° °Medication is no longer on list.  °

## 2019-11-07 ENCOUNTER — Other Ambulatory Visit: Payer: Self-pay | Admitting: Family Medicine

## 2019-11-07 ENCOUNTER — Telehealth: Payer: Self-pay | Admitting: Family Medicine

## 2019-11-07 MED ORDER — TRAMADOL HCL 50 MG PO TABS: 100.0000 mg | ORAL_TABLET | Freq: Three times a day (TID) | ORAL | 0 refills | Status: DC | PRN

## 2019-11-07 NOTE — Telephone Encounter (Signed)
I will resume her previous tramadol.

## 2019-11-07 NOTE — Telephone Encounter (Signed)
Please refuse as patient has prescription for Norco.

## 2019-11-07 NOTE — Telephone Encounter (Signed)
Pt called [facility] stating that she is done with her surgery and is no longer taking the pain medication given to her by her orthopedic. She would like to know if you will start her back on Tramadol?

## 2019-11-09 ENCOUNTER — Encounter: Payer: Self-pay | Admitting: Family Medicine

## 2019-11-09 ENCOUNTER — Ambulatory Visit (INDEPENDENT_AMBULATORY_CARE_PROVIDER_SITE_OTHER): Payer: Medicare Other | Admitting: Family Medicine

## 2019-11-09 ENCOUNTER — Other Ambulatory Visit: Payer: Self-pay

## 2019-11-09 VITALS — BP 190/90 | HR 115 | Temp 97.4°F | Resp 18 | Ht <= 58 in | Wt 140.0 lb

## 2019-11-09 DIAGNOSIS — R309 Painful micturition, unspecified: Secondary | ICD-10-CM

## 2019-11-09 DIAGNOSIS — I10 Essential (primary) hypertension: Secondary | ICD-10-CM

## 2019-11-09 DIAGNOSIS — N3001 Acute cystitis with hematuria: Secondary | ICD-10-CM

## 2019-11-09 LAB — URINALYSIS, ROUTINE W REFLEX MICROSCOPIC
Bilirubin Urine: NEGATIVE
Glucose, UA: NEGATIVE
Ketones, ur: NEGATIVE
Nitrite: NEGATIVE
Specific Gravity, Urine: 1.025 (ref 1.001–1.03)
pH: 5.5 (ref 5.0–8.0)

## 2019-11-09 LAB — MICROSCOPIC MESSAGE

## 2019-11-09 MED ORDER — CLONAZEPAM 1 MG PO TABS: 1.0000 mg | ORAL_TABLET | Freq: Three times a day (TID) | ORAL | 2 refills | Status: DC

## 2019-11-09 MED ORDER — CIPROFLOXACIN HCL 500 MG PO TABS: 500.0000 mg | ORAL_TABLET | Freq: Two times a day (BID) | ORAL | 0 refills | Status: DC

## 2019-11-09 MED ORDER — HYDROCHLOROTHIAZIDE 25 MG PO TABS: 25.0000 mg | ORAL_TABLET | Freq: Every day | ORAL | 3 refills | Status: DC

## 2019-11-09 NOTE — Progress Notes (Signed)
Subjective:    Patient ID: Felicia [name], female    DOB: Sep 20, 1966, 53 y.o.   MRN: [id number]  HPI  Patient recently had outpatient surgery for rotator cuff tear.  Ever since discharge from the surgical center, the patient reports dysuria and frequency.  She reports pain in her bladder.  She reports a "tickle-like" sensation when she urinates.  She reports gross hematuria.  She denies any low back pain or radiating pain that would suggest a kidney stone.  She does report urgency and hesitancy.  Also reports dysuria.  Urinalysis shows +3 blood as well as +1 leukocyte esterase but negative nitrites.  Of note, the patient's blood pressure is extremely high at 190/90.  She denies any chest pain or shortness of breath or dyspnea on exertion.  She is taking her metoprolol as prescribed.  She is also run out of her Klonopin.  This could be contributing to her blood pressure being elevated as well.  She is overdue for a refill on this. Past Medical History:  Diagnosis Date  . Allergy    year around  . Anemia   . Anxiety   . Arthritis   . Asthma   . Atrial fibrillation ([place])   . Bipolar 1 disorder ([place])   . Borderline diabetes   . Depression   . Early cataract    right  . Ectopic pregnancy   . Fatty liver   . Headache   . Heart murmur   . Hyperlipidemia   . Insomnia   . Peripheral arterial disease ([place])   . Pneumonia   . PUD (peptic ulcer disease)   . Right knee pain 12-05-2015   per pt.  she fell out of her husband's truck and landed on her right knee  . Shortness of breath dyspnea   . Wears glasses   . Wears partial dentures    Past Surgical History:  Procedure Laterality Date  . ABDOMINAL AORTAGRAM N/A 09/12/2012   Procedure: ABDOMINAL AORTAGRAM;  Surgeon: Conrad Wright, MD;  [facility] CATH LAB;  Service: Cardiovascular;  Laterality: N/A;  . AORTA - BILATERAL FEMORAL ARTERY BYPASS GRAFT N/A 10/23/2014   Procedure: AORTOBIFEMORAL BYPASS GRAFT;  Surgeon: Angelia Mould,  MD;  Location: [place];  Service: Vascular;  Laterality: N/A;  . BACK SURGERY    . CESAREAN SECTION    . COLONOSCOPY  01-14-2004   > 11 yr ago Dr Collene Mares - normal - report in epic   . DILATATION & CURETTAGE/HYSTEROSCOPY WITH MYOSURE N/A 10/15/2015   Procedure: DILATATION & CURETTAGE/HYSTEROSCOPY WITH MYOSURE;  Surgeon: Terrance Mass, MD;  Location: [facility];  Service: Gynecology;  Laterality: N/A;  . ESOPHAGOGASTRODUODENOSCOPY  01-14-2004   gastritis- dr Collene Mares- in epic already   . MULTIPLE TOOTH EXTRACTIONS    . NOSE SURGERY    . NOSE SURGERY    . SHOULDER ARTHROSCOPY WITH SUBACROMIAL DECOMPRESSION, ROTATOR CUFF REPAIR AND BICEP TENDON REPAIR Left 09/26/2019   Procedure: left shoulder arthroscopy, biceps release and tenodesis, mini open rotator cuff tear repair subscapularis and supraspinatus;  Surgeon: Meredith Pel, MD;  Location: [place];  Service: Orthopedics;  Laterality: Left;  . SHOULDER SURGERY     rotator cuff repair   . TONSILLECTOMY    . TUBAL LIGATION    . UPPER GASTROINTESTINAL ENDOSCOPY  01-14-2004   in epic- dr Collene Mares- gastritis    Current Outpatient Medications on File Prior to Visit  Medication Sig Dispense Refill  . albuterol (VENTOLIN HFA)  108 (90 Base) MCG/ACT inhaler Inhale 2 puffs into the lungs every 6 (six) hours as needed for wheezing or shortness of breath.    Marland Kitchen aspirin EC 81 MG tablet Take 81 mg by mouth daily.    Marland Kitchen atorvastatin (LIPITOR) 80 MG tablet TAKE 1 TABLET BY MOUTH EVERY DAY (Patient taking differently: Take 80 mg by mouth every evening. ) 90 tablet 3  . cyclobenzaprine (FLEXERIL) 10 MG tablet TAKE 1 TABLET BY MOUTH THREE TIMES A DAY AS NEEDED FOR MUSCLE SPASM 90 tablet 1  . dicyclomine (BENTYL) 20 MG tablet TAKE 1 TABLET (20 MG TOTAL) BY MOUTH 3 (THREE) TIMES DAILY WITH MEALS AS NEEDED FOR SPASMS. 20 tablet 0  . diphenhydrAMINE (BENADRYL) 25 MG tablet Take 50 mg by mouth every 6 (six) hours as needed for allergies.    Marland Kitchen EPIPEN 2-PAK 0.3 MG/0.3ML SOAJ  injection USE AS DIRECTED (Patient taking differently: Inject 0.3 mg into the muscle as needed for anaphylaxis. ) 2 Device 0  . metoprolol succinate (TOPROL-XL) 50 MG 24 hr tablet EVERY DAY (Patient taking differently: Take 50 mg by mouth daily. ) 90 tablet 1  . naproxen sodium (ALEVE) 220 MG tablet Take 440 mg by mouth 2 (two) times daily as needed (pain).    . ondansetron (ZOFRAN) 4 MG tablet Take 1 tablet (4 mg total) by mouth every 8 (eight) hours as needed for nausea or vomiting. 20 tablet 0  . traMADol (ULTRAM) 50 MG tablet Take 2 tablets (100 mg total) by mouth every 8 (eight) hours as needed. 180 tablet 0   No current facility-administered medications on file prior to visit.   Allergies  Allergen Reactions  . Bee Pollen Swelling and Other (See Comments)    Also ticks   . Bactrim [Sulfamethoxazole-Trimethoprim] Hives  . Cephalosporins Hives  . Doxycycline Hives  . Tetracyclines & Related Hives  . Other Itching    Nuts causes itching and hives not sure what type  . Augmentin [Amoxicillin-Pot Clavulanate] Diarrhea  . Codeine Nausea Only  . Reflex Pain Relief [Menthol] Rash    Muscle Rub   Social History   Socioeconomic History  . Marital status: Married    Spouse name: Not on file  . Number of children: Not on file  . Years of education: Not on file  . Highest education level: Not on file  Occupational History  . Not on file  Tobacco Use  . Smoking status: Current Every Day Smoker    Packs/day: 0.50    Years: 30.00    Pack years: 15.00    Types: Cigarettes  . Smokeless tobacco: Never Used  Substance and Sexual Activity  . Alcohol use: Yes    Alcohol/week: 1.0 standard drinks    Types: 1 Shots of liquor per week    Comment: very occassionally on social occassions  . Drug use: No  . Sexual activity: Yes  Other Topics Concern  . Not on file  Social History Narrative  . Not on file   Social Determinants of Health   Financial Resource Strain:   . Difficulty of  Paying Living Expenses: Not on file  Food Insecurity:   . Worried About Programme researcher, broadcasting/film/video in the Last Year: Not on file  . Ran Out of Food in the Last Year: Not on file  Transportation Needs:   . Lack of Transportation (Medical): Not on file  . Lack of Transportation (Non-Medical): Not on file  Physical Activity:   . Days of  Exercise per Week: Not on file  . Minutes of Exercise per Session: Not on file  Stress:   . Feeling of Stress : Not on file  Social Connections:   . Frequency of Communication with Friends and Family: Not on file  . Frequency of Social Gatherings with Friends and Family: Not on file  . Attends Religious Services: Not on file  . Active Member of Clubs or Organizations: Not on file  . Attends BankerClub or Organization Meetings: Not on file  . Marital Status: Not on file  Intimate Partner Violence:   . Fear of Current or Ex-Partner: Not on file  . Emotionally Abused: Not on file  . Physically Abused: Not on file  . Sexually Abused: Not on file   Family History  Adopted: Yes  Problem Relation Age of Onset  . Heart disease Mother   . Diabetes Mother   . Hyperlipidemia Mother   . Hypertension Mother   . Heart attack Mother   . Heart disease Father   . Diabetes Father   . Heart attack Father   . Hypertension Father   . Bladder Cancer Father   . Diabetes Brother   . Lung cancer Paternal Grandfather   . Colon cancer Neg Hx   . Colon polyps Neg Hx   . Rectal cancer Neg Hx   . Stomach cancer Neg Hx       Review of Systems  All other systems reviewed and are negative.      Objective:   Physical Exam  Constitutional: She appears well-developed and well-nourished. No distress.  Cardiovascular: Normal rate, regular rhythm and intact distal pulses.  Murmur heard. Pulmonary/Chest: Effort normal. No respiratory distress. She has no wheezes. She has no rales.  Abdominal: Soft. Bowel sounds are normal. She exhibits no mass. There is no abdominal tenderness.  There is no rebound and no guarding.  Genitourinary: Uterus is not enlarged. Cervix exhibits no motion tenderness. Right adnexum displays no mass. Left adnexum displays no mass.  Skin: She is not diaphoretic.  Vitals reviewed.         Assessment & Plan:  Painful urination - Plan: Urinalysis, Routine w reflex microscopic  Essential hypertension  Acute cystitis with hematuria  Patient appears to have a urinary tract infection.  She has a history of allergies to Bactrim as well as cephalosporins.  I will use Cipro 500 mg p.o. twice daily for 5 days.  Also add hydrochlorothiazide 25 mg a day for hypertension and recheck blood pressure next week.

## 2019-11-09 NOTE — Telephone Encounter (Signed)
Pt aware.

## 2019-11-15 ENCOUNTER — Ambulatory Visit: Payer: Medicare Other | Admitting: Family Medicine

## 2019-11-29 ENCOUNTER — Ambulatory Visit: Payer: Medicare Other | Admitting: Orthopedic Surgery

## 2019-12-01 ENCOUNTER — Other Ambulatory Visit: Payer: Self-pay | Admitting: Family Medicine

## 2019-12-01 DIAGNOSIS — M5431 Sciatica, right side: Secondary | ICD-10-CM

## 2019-12-01 NOTE — Telephone Encounter (Signed)
Ok to refill 

## 2019-12-04 ENCOUNTER — Other Ambulatory Visit: Payer: Self-pay | Admitting: Family Medicine

## 2019-12-04 NOTE — Telephone Encounter (Signed)
Ok to refill??  Last office visit 11/09/2019.  Last refill 11/07/2019.

## 2019-12-05 ENCOUNTER — Other Ambulatory Visit: Payer: Self-pay | Admitting: Family Medicine

## 2019-12-05 MED ORDER — ALBUTEROL SULFATE HFA 108 (90 BASE) MCG/ACT IN AERS: 2.0000 | INHALATION_SPRAY | Freq: Four times a day (QID) | RESPIRATORY_TRACT | 3 refills | Status: DC | PRN

## 2019-12-05 NOTE — Telephone Encounter (Signed)
Requesting refill  Klonopin  LOV:  11/09/19  LRF:  11/09/19

## 2019-12-07 MED ORDER — CLONAZEPAM 1 MG PO TABS: 1.0000 mg | ORAL_TABLET | Freq: Three times a day (TID) | ORAL | 2 refills | Status: DC

## 2019-12-19 ENCOUNTER — Ambulatory Visit (INDEPENDENT_AMBULATORY_CARE_PROVIDER_SITE_OTHER): Payer: Medicare PPO

## 2019-12-19 ENCOUNTER — Other Ambulatory Visit: Payer: Self-pay

## 2019-12-19 ENCOUNTER — Ambulatory Visit
Admission: EM | Admit: 2019-12-19 | Discharge: 2019-12-19 | Disposition: A | Discharge status: Home / Self Care | Payer: Medicare PPO | Attending: Emergency Medicine | Admitting: Emergency Medicine

## 2019-12-19 ENCOUNTER — Telehealth: Payer: Self-pay | Admitting: Family Medicine

## 2019-12-19 DIAGNOSIS — J209 Acute bronchitis, unspecified: Secondary | ICD-10-CM

## 2019-12-19 DIAGNOSIS — Z20822 Contact with and (suspected) exposure to covid-19: Secondary | ICD-10-CM | POA: Diagnosis not present

## 2019-12-19 DIAGNOSIS — R0602 Shortness of breath: Secondary | ICD-10-CM

## 2019-12-19 LAB — POC SARS CORONAVIRUS 2 AG -  ED: SARS Coronavirus 2 Ag: NEGATIVE

## 2019-12-19 MED ORDER — BENZONATATE 100 MG PO CAPS: 100.0000 mg | ORAL_CAPSULE | Freq: Three times a day (TID) | ORAL | 0 refills | Status: DC

## 2019-12-19 MED ORDER — AZITHROMYCIN 250 MG PO TABS: 250.0000 mg | ORAL_TABLET | Freq: Every day | ORAL | 0 refills | Status: DC

## 2019-12-19 MED ORDER — PREDNISONE 10 MG (21) PO TBPK: ORAL_TABLET | Freq: Every day | ORAL | 0 refills | Status: DC

## 2019-12-19 MED ORDER — FLUTICASONE PROPIONATE 50 MCG/ACT NA SUSP: 1.0000 | Freq: Every day | NASAL | 0 refills | Status: DC

## 2019-12-19 NOTE — Discharge Instructions (Addendum)
Point-of-care COVID-19 test was negative Chest x-ray showed bronchitis changes in the lung COVID testing ordered.  It will take between 2-7 days for test results.  Someone will contact you regarding abnormal results.    In the meantime: You should remain isolated in your home for 10 days from symptom onset AND greater than 72 hours after symptoms resolution (absence of fever without the use of fever-reducing medication and improvement in respiratory symptoms), whichever is longer Get plenty of rest and push fluids Use medications daily for symptom relief Use OTC medications like ibuprofen or tylenol as needed fever or pain Call or go to the ED if you have any new or worsening symptoms such as fever, worsening cough, shortness of breath, chest tightness, chest pain, turning blue, changes in mental status, etc..Marland Kitchen

## 2019-12-19 NOTE — ED Triage Notes (Signed)
Patient states that she has cough with thick mucus, shortness of breath, fatigue, wheezing. posterior back pain since December. Hx of asthma Current smoker

## 2019-12-19 NOTE — Telephone Encounter (Signed)
Patient called in stating that she is having difficulty breathing. Patient states that she has been having SOB with exertion and while laying down. Also has c/o wheezing and coughing up thick chunks of mucus. Advised patient in the event of difficulty breathing with rest and with walking she needs to go to the ER. Patient verbalized understanding.

## 2019-12-19 NOTE — ED Provider Notes (Signed)
[facility]    CSN: [id number] Arrival date & time: 12/19/19  1639      History   Chief Complaint Chief Complaint  Patient presents with  . SOB, productive cough    HPI Felicia [name] is a 54 y.o. female.   With history of asthma and current smoker presented to the urgent care with a complaint of cough with thick mucus, fatigue, shortness of breath and wheezing for the past 2 months.  Denies sick exposure to COVID, flu or strep.  Denies recent travel.  Denies aggravating or alleviating symptoms.  Denies previous COVID infection.   Denies fever, chills,  nasal congestion, rhinorrhea, sore throat,  SOB, wheezing, chest pain, nausea, vomiting, changes in bowel or bladder habits.    The history is provided by the patient. No language interpreter was used.    Past Medical History:  Diagnosis Date  . Allergy    year around  . Anemia   . Anxiety   . Arthritis   . Asthma   . Atrial fibrillation ([facility])   . Bipolar 1 disorder ([facility])   . Borderline diabetes   . Depression   . Early cataract    right  . Ectopic pregnancy   . Fatty liver   . Headache   . Heart murmur   . Hyperlipidemia   . Insomnia   . Peripheral arterial disease ([facility])   . Pneumonia   . PUD (peptic ulcer disease)   . Right knee pain 12-05-2015   per pt.  she fell out of her husband's truck and landed on her right knee  . Shortness of breath dyspnea   . Wears glasses   . Wears partial dentures     Patient Active Problem List   Diagnosis Date Noted  . Chronic left shoulder pain 09/13/2019  . Partial tear of left subscapularis tendon 08/01/2019  . Low back strain 05/08/2015  . Asthma, chronic 05/08/2015  . Atherosclerosis of native artery of both lower extremities with intermittent claudication ([facility]) 10/23/2014  . PVD (peripheral vascular disease) with claudication ([facility]) 09/26/2014  . Atherosclerosis of native arteries of extremity with intermittent claudication ([facility]) 09/14/2012  . PUD  (peptic ulcer disease)   . Hyperlipidemia     Past Surgical History:  Procedure Laterality Date  . ABDOMINAL AORTAGRAM N/A 09/12/2012   Procedure: ABDOMINAL AORTAGRAM;  Surgeon: Fransisco HertzBrian L Chen, MD;  [facility];  Service: Cardiovascular;  Laterality: N/A;  . AORTA - BILATERAL FEMORAL ARTERY BYPASS GRAFT N/A 10/23/2014   Procedure: AORTOBIFEMORAL BYPASS GRAFT;  Surgeon: Chuck Hinthristopher S Dickson, MD;  [facility];  Service: Vascular;  Laterality: N/A;  . BACK SURGERY    . CESAREAN SECTION    . COLONOSCOPY  01-14-2004   > 11 yr ago Dr Loreta AveMann - normal - report in epic   . DILATATION & CURETTAGE/HYSTEROSCOPY WITH MYOSURE N/A 10/15/2015   Procedure: DILATATION & CURETTAGE/HYSTEROSCOPY WITH MYOSURE;  Surgeon: Ok EdwardsJuan H Fernandez, MD;  Location: [facility];  Service: Gynecology;  Laterality: N/A;  . ESOPHAGOGASTRODUODENOSCOPY  01-14-2004   gastritis- dr Loreta Avemann- in epic already   . MULTIPLE TOOTH EXTRACTIONS    . NOSE SURGERY    . NOSE SURGERY    . SHOULDER ARTHROSCOPY WITH SUBACROMIAL DECOMPRESSION, ROTATOR CUFF REPAIR AND BICEP TENDON REPAIR Left 09/26/2019   Procedure: left shoulder arthroscopy, biceps release and tenodesis, mini open rotator cuff tear repair subscapularis and supraspinatus;  Surgeon: Cammy Copaean, Gregory Scott, MD;  Location: [facility];  Service: Orthopedics;  Laterality: Left;  . SHOULDER SURGERY     rotator cuff repair   . TONSILLECTOMY    . TUBAL LIGATION    . UPPER GASTROINTESTINAL ENDOSCOPY  01-14-2004   in epic- dr Loreta Ave- gastritis     OB History    Gravida  3   Para  2   Term      Preterm      AB  1   Living  2     SAB      TAB      Ectopic  1   Multiple      Live Births               Home Medications    Prior to Admission medications   Medication Sig Start Date End Date Taking? Authorizing Provider  albuterol (VENTOLIN HFA) 108 (90 Base) MCG/ACT inhaler Inhale 2 puffs into the lungs every 6 (six) hours as needed for wheezing or shortness of breath.  12/05/19   Donita Brooks, MD  aspirin EC 81 MG tablet Take 81 mg by mouth daily.    [provider]  atorvastatin (LIPITOR) 80 MG tablet TAKE 1 TABLET BY MOUTH EVERY DAY Patient taking differently: Take 80 mg by mouth every evening.  03/31/19   Donita Brooks, MD  azithromycin (ZITHROMAX) 250 MG tablet Take 1 tablet (250 mg total) by mouth daily. Take first 2 tablets together, then 1 every day until finished. 12/19/19   Maicie Vanderloop, Zachery Dakins, FNP  benzonatate (TESSALON) 100 MG capsule Take 1 capsule (100 mg total) by mouth every 8 (eight) hours. 12/19/19   Naiara Lombardozzi, Zachery Dakins, FNP  ciprofloxacin (CIPRO) 500 MG tablet Take 1 tablet (500 mg total) by mouth 2 (two) times daily. 11/09/19   Donita Brooks, MD  clonazePAM (KLONOPIN) 1 MG tablet Take 1 tablet (1 mg total) by mouth 3 (three) times daily. 12/07/19   Donita Brooks, MD  cyclobenzaprine (FLEXERIL) 10 MG tablet TAKE 1 TABLET BY MOUTH THREE TIMES A DAY AS NEEDED FOR MUSCLE SPASM 12/01/19   Donita Brooks, MD  dicyclomine (BENTYL) 20 MG tablet TAKE 1 TABLET (20 MG TOTAL) BY MOUTH 3 (THREE) TIMES DAILY WITH MEALS AS NEEDED FOR SPASMS. 05/31/18   Donita Brooks, MD  diphenhydrAMINE (BENADRYL) 25 MG tablet Take 50 mg by mouth every 6 (six) hours as needed for allergies.    [provider]  EPIPEN 2-PAK 0.3 MG/0.3ML SOAJ injection USE AS DIRECTED Patient taking differently: Inject 0.3 mg into the muscle as needed for anaphylaxis.  04/29/16   Donita Brooks, MD  fluticasone (FLONASE) 50 MCG/ACT nasal spray Place 1 spray into both nostrils daily for 14 days. 12/19/19 01/02/20  Stana Bayon, Zachery Dakins, FNP  hydrochlorothiazide (HYDRODIURIL) 25 MG tablet Take 1 tablet (25 mg total) by mouth daily. 11/09/19   Donita Brooks, MD  metoprolol succinate (TOPROL-XL) 50 MG 24 hr tablet EVERY DAY Patient taking differently: Take 50 mg by mouth daily.  05/24/19   Donita Brooks, MD  naproxen sodium (ALEVE) 220 MG tablet Take 440 mg by mouth 2  (two) times daily as needed (pain).    [provider]  ondansetron (ZOFRAN) 4 MG tablet Take 1 tablet (4 mg total) by mouth every 8 (eight) hours as needed for nausea or vomiting. 08/30/19   Cristie Hem, PA-C  predniSONE (STERAPRED UNI-PAK 21 TAB) 10 MG (21) TBPK tablet Take by mouth daily. Take 6 tabs by mouth daily  for 2 days, then 5 tabs for 2 days, then 4 tabs for 2 days, then 3 tabs for 2 days, 2 tabs for 2 days, then 1 tab by mouth daily for 2 days 12/19/19   Emerson Monte, FNP  traMADol (ULTRAM) 50 MG tablet TAKE 2 TABLETS (100 MG TOTAL) BY MOUTH EVERY 8 (EIGHT) HOURS AS NEEDED. 12/05/19 01/04/20  Susy Frizzle, MD    Family History Family History  Adopted: Yes  Problem Relation Age of Onset  . Heart disease Mother   . Diabetes Mother   . Hyperlipidemia Mother   . Hypertension Mother   . Heart attack Mother   . Heart disease Father   . Diabetes Father   . Heart attack Father   . Hypertension Father   . Bladder Cancer Father   . Diabetes Brother   . Lung cancer Paternal Grandfather   . Colon cancer Neg Hx   . Colon polyps Neg Hx   . Rectal cancer Neg Hx   . Stomach cancer Neg Hx     Social History Social History   Tobacco Use  . Smoking status: Current Every Day Smoker    Packs/day: 0.50    Years: 30.00    Pack years: 15.00    Types: Cigarettes  . Smokeless tobacco: Never Used  Substance Use Topics  . Alcohol use: Yes    Alcohol/week: 1.0 standard drinks    Types: 1 Shots of liquor per week    Comment: very occassionally on social occassions  . Drug use: No     Allergies   Bee pollen, Bactrim [sulfamethoxazole-trimethoprim], Cephalosporins, Doxycycline, Tetracyclines & related, Other, Augmentin [amoxicillin-pot clavulanate], Codeine, and Reflex pain relief [menthol]   Review of Systems Review of Systems  Constitutional: Positive for fatigue.  Respiratory: Positive for cough, shortness of breath and wheezing.   Cardiovascular: Negative.    Gastrointestinal: Negative.   Neurological: Negative.      Physical Exam Triage Vital Signs ED Triage Vitals  Enc Vitals Group     BP 12/19/19 1649 (!) 145/70     Pulse Rate 12/19/19 1649 (!) 110     Resp 12/19/19 1649 18     Temp 12/19/19 1649 98.8 F (37.1 C)     Temp Source 12/19/19 1649 Oral     SpO2 12/19/19 1649 93 %     Weight --      Height --      Head Circumference --      Peak Flow --      Pain Score 12/19/19 1701 0     Pain Loc --      Pain Edu? --      Excl. in [place]? --    No data found.  Updated Vital Signs BP (!) 145/70 (BP Location: Right Arm)   Pulse (!) 110   Temp 98.8 F (37.1 C) (Oral)   Resp 18   LMP 12/26/2012   SpO2 93%   Visual Acuity Right Eye Distance:   Left Eye Distance:   Bilateral Distance:    Right Eye Near:   Left Eye Near:    Bilateral Near:     Physical Exam Vitals and nursing note reviewed.  Constitutional:      General: She is not in acute distress.    Appearance: Normal appearance. She is normal weight. She is not ill-appearing or toxic-appearing.  HENT:     Head: Normocephalic.     Right Ear: Tympanic membrane, ear canal and external ear  normal. There is no impacted cerumen.     Left Ear: Tympanic membrane, ear canal and external ear normal. There is no impacted cerumen.     Nose: Nose normal. No congestion.     Mouth/Throat:     Mouth: Mucous membranes are moist.     Pharynx: Oropharynx is clear. No oropharyngeal exudate or posterior oropharyngeal erythema.  Cardiovascular:     Rate and Rhythm: Normal rate and regular rhythm.     Pulses: Normal pulses.     Heart sounds: Normal heart sounds. No murmur.  Pulmonary:     Effort: Pulmonary effort is normal. No respiratory distress.     Breath sounds: Normal breath sounds. No wheezing or rhonchi.  Chest:     Chest wall: No tenderness.  Abdominal:     General: Abdomen is flat. Bowel sounds are normal. There is no distension.     Palpations: There is no mass.      Tenderness: There is no abdominal tenderness.  Skin:    Capillary Refill: Capillary refill takes less than 2 seconds.  Neurological:     General: No focal deficit present.     Mental Status: She is alert and oriented to person, place, and time.      UC Treatments / Results  Labs (all labs ordered are listed, but only abnormal results are displayed) Labs Reviewed  NOVEL CORONAVIRUS, NAA  POC SARS CORONAVIRUS 2 AG -  ED    EKG   Radiology DG Chest 2 View  Result Date: 12/19/2019 CLINICAL DATA:  Shortness of breath, cough EXAM: CHEST - 2 VIEW COMPARISON:  05/25/2018 FINDINGS: Mild chronic bronchitic changes. No focal consolidation, pleural effusion or pneumothorax. Normal heart size. Aortic atherosclerosis. Mild apical pleural thickening. No pneumothorax. Metallic foreign objects over the medial right chest on the frontal view. IMPRESSION: 1. No acute focal airspace disease.  Chronic bronchitic changes 2. Metallic foreign objects over the medial right chest, correlate with physical examination. Electronically Signed   By: Jasmine Pang M.D.   On: 12/19/2019 17:20    Procedures Procedures (including critical care time)  Medications Ordered in UC Medications - No data to display  Initial Impression / Assessment and Plan / UC Course  I have reviewed the triage vital signs and the nursing notes.  Pertinent labs & imaging results that were available during my care of the patient were reviewed by me and considered in my medical decision making (see chart for details).    Point-of-care COVID-19 test was negative Chest x-ray was ordered and result was reviewed.  Results show bronchitis changes. Advised patient to quarantine Prednisone was prescribed Tessalon was prescribed Flonase was prescribed Azithromycin was prescribed for possible bronchitis To go to ED for worsening of symptoms  Final Clinical Impressions(s) / UC Diagnoses   Final diagnoses:  Acute bronchitis,  unspecified organism  COVID-19 ruled out     Discharge Instructions     Point-of-care COVID-19 test was negative Chest x-ray showed bronchitis changes in the lung COVID testing ordered.  It will take between 2-7 days for test results.  Someone will contact you regarding abnormal results.    In the meantime: You should remain isolated in your home for 10 days from symptom onset AND greater than 72 hours after symptoms resolution (absence of fever without the use of fever-reducing medication and improvement in respiratory symptoms), whichever is longer Get plenty of rest and push fluids Use medications daily for symptom relief Use OTC medications like ibuprofen or tylenol  as needed fever or pain Call or go to the ED if you have any new or worsening symptoms such as fever, worsening cough, shortness of breath, chest tightness, chest pain, turning blue, changes in mental status, etc...     ED Prescriptions    Medication Sig Dispense Auth. Provider   azithromycin (ZITHROMAX) 250 MG tablet Take 1 tablet (250 mg total) by mouth daily. Take first 2 tablets together, then 1 every day until finished. 6 tablet Yaw Escoto S, FNP   predniSONE (STERAPRED UNI-PAK 21 TAB) 10 MG (21) TBPK tablet Take by mouth daily. Take 6 tabs by mouth daily  for 2 days, then 5 tabs for 2 days, then 4 tabs for 2 days, then 3 tabs for 2 days, 2 tabs for 2 days, then 1 tab by mouth daily for 2 days 42 tablet Tremain Rucinski S, FNP   fluticasone (FLONASE) 50 MCG/ACT nasal spray Place 1 spray into both nostrils daily for 14 days. 16 g Deosha Werden S, FNP   benzonatate (TESSALON) 100 MG capsule Take 1 capsule (100 mg total) by mouth every 8 (eight) hours. 21 capsule Raeley Gilmore, Zachery Dakins, FNP     PDMP not reviewed this encounter.   Durward Parcel, FNP 12/19/19 1744

## 2019-12-20 LAB — NOVEL CORONAVIRUS, NAA: SARS-CoV-2, NAA: NOT DETECTED

## 2020-01-09 ENCOUNTER — Other Ambulatory Visit: Payer: Self-pay | Admitting: Family Medicine

## 2020-01-10 NOTE — Telephone Encounter (Signed)
Ok to refill??  Last office visit 11/09/2019.  Medication is not on current list.

## 2020-02-02 ENCOUNTER — Other Ambulatory Visit: Payer: Self-pay | Admitting: Family Medicine

## 2020-02-02 DIAGNOSIS — M5431 Sciatica, right side: Secondary | ICD-10-CM

## 2020-02-02 NOTE — Telephone Encounter (Signed)
Requested Prescriptions   Pending Prescriptions Disp Refills  . cyclobenzaprine (FLEXERIL) 10 MG tablet [Pharmacy Med Name: CYCLOBENZAPRINE 10 MG TABLET] 90 tablet 1    Sig: TAKE 1 TABLET BY MOUTH THREE TIMES A DAY AS NEEDED FOR MUSCLE SPASM    Last OV 11/09/2019   Last written 12/01/2019

## 2020-02-04 ENCOUNTER — Other Ambulatory Visit: Payer: Self-pay | Admitting: Family Medicine

## 2020-02-04 DIAGNOSIS — M5431 Sciatica, right side: Secondary | ICD-10-CM

## 2020-02-05 NOTE — Telephone Encounter (Signed)
Ok to refill 

## 2020-02-08 ENCOUNTER — Telehealth: Payer: Self-pay | Admitting: Family Medicine

## 2020-02-08 ENCOUNTER — Other Ambulatory Visit: Payer: Self-pay | Admitting: Family Medicine

## 2020-02-08 NOTE — Telephone Encounter (Signed)
Pt states that she needs antibx before dental surgery and the dental clinic is calling asking if she needs antibiotics before or not?  (I do not see it noted in chart) She is scheduled for procedure tomorrow morning.   [facility] Carollee Herter [phone]

## 2020-02-08 NOTE — Telephone Encounter (Signed)
Called and spoke to [place] and let her know recommendations

## 2020-02-08 NOTE — Telephone Encounter (Signed)
   No diagnosis in chart to support need for antibiotics before dental surgery  She does not have an artifical heart valve

## 2020-02-13 ENCOUNTER — Other Ambulatory Visit: Payer: Self-pay

## 2020-02-13 ENCOUNTER — Encounter: Payer: Self-pay | Admitting: Family Medicine

## 2020-02-13 ENCOUNTER — Ambulatory Visit (INDEPENDENT_AMBULATORY_CARE_PROVIDER_SITE_OTHER): Payer: Medicare Other | Admitting: Family Medicine

## 2020-02-13 VITALS — BP 138/82 | HR 116 | Temp 97.6°F | Resp 20 | Ht <= 58 in | Wt 144.0 lb

## 2020-02-13 DIAGNOSIS — Z1231 Encounter for screening mammogram for malignant neoplasm of breast: Secondary | ICD-10-CM | POA: Diagnosis not present

## 2020-02-13 DIAGNOSIS — E7800 Pure hypercholesterolemia, unspecified: Secondary | ICD-10-CM | POA: Diagnosis not present

## 2020-02-13 DIAGNOSIS — I10 Essential (primary) hypertension: Secondary | ICD-10-CM | POA: Diagnosis not present

## 2020-02-13 DIAGNOSIS — I739 Peripheral vascular disease, unspecified: Secondary | ICD-10-CM | POA: Diagnosis not present

## 2020-02-13 DIAGNOSIS — R7303 Prediabetes: Secondary | ICD-10-CM | POA: Diagnosis not present

## 2020-02-13 MED ORDER — ALBUTEROL SULFATE HFA 108 (90 BASE) MCG/ACT IN AERS: 2.0000 | INHALATION_SPRAY | Freq: Four times a day (QID) | RESPIRATORY_TRACT | 3 refills | Status: DC | PRN

## 2020-02-13 MED ORDER — TRAMADOL HCL 50 MG PO TABS: 100.0000 mg | ORAL_TABLET | Freq: Three times a day (TID) | ORAL | 0 refills | Status: AC | PRN

## 2020-02-13 NOTE — Progress Notes (Signed)
Subjective:    Patient ID: Felicia [name], female    DOB: 10-Jan-1966, 54 y.o.   MRN: [id number]  HPI  Patient presents today for a checkup.  She has a history of hypertension as well as hyperlipidemia.  She also has a history of peripheral artery disease requiring aortobifemoral bypass in 2015.  She also history of history of peptic ulcer disease and therefore she cannot tolerate NSAIDs however she has severe bilateral shoulder pain secondary to rotator cuff tears and injuries.  For this reason she takes tramadol 100 mg every 8 hours (180 tablets/month.  She also has severe muscle spasms in her neck and in her shoulders secondary to the pain for which she takes Flexeril 10 mg every 8 hours.  She is interested in switching from Flexeril to [place].  I recommended against that due to the risk of habituation to some however I would be willing to switch to a different muscle relaxer such as Zanaflex or Robaxin to see if this would work better given the fact she has been using the Flexeril now for quite some time.  She also has a history of borderline type 2 diabetes.  She is overdue to recheck hemoglobin A1c.  She does report some blurry vision as well as dry mouth along with increased urinary frequency.  She denies any recent weight loss.  Her blood pressure today is acceptable at 138/82.  She denies any chest pain shortness of breath or dyspnea on exertion.  She does have her stable sinus tachycardia with heart rate around 110 bpm.  She continues to smoke and has no desire to quit smoking.  She is overdue for flu shot as well as a tetanus shot which she declines today.  She is overdue for mammogram which she will allow me to schedule.  She is due for HIV and hepatitis C testing but the patient declines this due to lack of risk factors. Past Medical History:  Diagnosis Date  . Allergy    year around  . Anemia   . Anxiety   . Arthritis   . Asthma   . Atrial fibrillation (HCC)   . Bipolar 1 disorder (HCC)    . Borderline diabetes   . Depression   . Early cataract    right  . Ectopic pregnancy   . Fatty liver   . Headache   . Heart murmur   . Hyperlipidemia   . Insomnia   . Peripheral arterial disease (HCC)   . Pneumonia   . PUD (peptic ulcer disease)   . Right knee pain 12-05-2015   per pt.  she fell out of her husband's truck and landed on her right knee  . Shortness of breath dyspnea   . Wears glasses   . Wears partial dentures    Past Surgical History:  Procedure Laterality Date  . ABDOMINAL AORTAGRAM N/A 09/12/2012   Procedure: ABDOMINAL AORTAGRAM;  Surgeon: Fransisco Hertz, MD;  Location: [facility] CATH LAB;  Service: Cardiovascular;  Laterality: N/A;  . AORTA - BILATERAL FEMORAL ARTERY BYPASS GRAFT N/A 10/23/2014   Procedure: AORTOBIFEMORAL BYPASS GRAFT;  Surgeon: Chuck Hint, MD;  Location: [facility] OR;  Service: Vascular;  Laterality: N/A;  . BACK SURGERY    . CESAREAN SECTION    . COLONOSCOPY  01-14-2004   > 11 yr ago Dr Loreta Ave - normal - report in epic   . DILATATION & CURETTAGE/HYSTEROSCOPY WITH MYOSURE N/A 10/15/2015   Procedure: DILATATION & CURETTAGE/HYSTEROSCOPY WITH MYOSURE;  Surgeon: Ok Edwards, MD;  Location: [facility] ORS;  Service: Gynecology;  Laterality: N/A;  . ESOPHAGOGASTRODUODENOSCOPY  01-14-2004   gastritis- dr Loreta Ave- in epic already   . MULTIPLE TOOTH EXTRACTIONS    . NOSE SURGERY    . NOSE SURGERY    . SHOULDER ARTHROSCOPY WITH SUBACROMIAL DECOMPRESSION, ROTATOR CUFF REPAIR AND BICEP TENDON REPAIR Left 09/26/2019   Procedure: left shoulder arthroscopy, biceps release and tenodesis, mini open rotator cuff tear repair subscapularis and supraspinatus;  Surgeon: Cammy Copa, MD;  Location: [facility] OR;  Service: Orthopedics;  Laterality: Left;  . SHOULDER SURGERY     rotator cuff repair   . TONSILLECTOMY    . TUBAL LIGATION    . UPPER GASTROINTESTINAL ENDOSCOPY  01-14-2004   in epic- dr Loreta Ave- gastritis    Current Outpatient Medications on File Prior to Visit   Medication Sig Dispense Refill  . aspirin EC 81 MG tablet Take 81 mg by mouth daily.    Marland Kitchen atorvastatin (LIPITOR) 80 MG tablet TAKE 1 TABLET BY MOUTH EVERY DAY (Patient taking differently: Take 80 mg by mouth every evening. ) 90 tablet 3  . clonazePAM (KLONOPIN) 1 MG tablet Take 1 tablet (1 mg total) by mouth 3 (three) times daily. 90 tablet 2  . cyclobenzaprine (FLEXERIL) 10 MG tablet TAKE 1 TABLET BY MOUTH THREE TIMES A DAY AS NEEDED FOR MUSCLE SPASM 90 tablet 1  . dicyclomine (BENTYL) 20 MG tablet TAKE 1 TABLET (20 MG TOTAL) BY MOUTH 3 (THREE) TIMES DAILY WITH MEALS AS NEEDED FOR SPASMS. 20 tablet 0  . diphenhydrAMINE (BENADRYL) 25 MG tablet Take 50 mg by mouth every 6 (six) hours as needed for allergies.    Marland Kitchen EPIPEN 2-PAK 0.3 MG/0.3ML SOAJ injection USE AS DIRECTED (Patient taking differently: Inject 0.3 mg into the muscle as needed for anaphylaxis. ) 2 Device 0  . hydrochlorothiazide (HYDRODIURIL) 25 MG tablet Take 1 tablet (25 mg total) by mouth daily. 90 tablet 3  . metoprolol succinate (TOPROL-XL) 50 MG 24 hr tablet TAKE 1 TABLET BY MOUTH EVERY DAY 90 tablet 1  . naproxen sodium (ALEVE) 220 MG tablet Take 440 mg by mouth 2 (two) times daily as needed (pain).    . ondansetron (ZOFRAN) 4 MG tablet Take 1 tablet (4 mg total) by mouth every 8 (eight) hours as needed for nausea or vomiting. 20 tablet 0  . fluticasone (FLONASE) 50 MCG/ACT nasal spray Place 1 spray into both nostrils daily for 14 days. 16 g 0   No current facility-administered medications on file prior to visit.   Allergies  Allergen Reactions  . Bee Pollen Swelling and Other (See Comments)    Also ticks   . Bactrim [Sulfamethoxazole-Trimethoprim] Hives  . Cephalosporins Hives  . Doxycycline Hives  . Tetracyclines & Related Hives  . Other Itching    Nuts causes itching and hives not sure what type  . Augmentin [Amoxicillin-Pot Clavulanate] Diarrhea  . Codeine Nausea Only  . Reflex Pain Relief [Menthol] Rash    Muscle  Rub   Social History   Socioeconomic History  . Marital status: Married    Spouse name: Not on file  . Number of children: Not on file  . Years of education: Not on file  . Highest education level: Not on file  Occupational History  . Not on file  Tobacco Use  . Smoking status: Current Every Day Smoker    Packs/day: 0.50    Years: 30.00    Pack years:  15.00    Types: Cigarettes  . Smokeless tobacco: Never Used  Substance and Sexual Activity  . Alcohol use: Yes    Alcohol/week: 1.0 standard drinks    Types: 1 Shots of liquor per week    Comment: very occassionally on social occassions  . Drug use: No  . Sexual activity: Yes  Other Topics Concern  . Not on file  Social History Narrative  . Not on file   Social Determinants of Health   Financial Resource Strain:   . Difficulty of Paying Living Expenses:   Food Insecurity:   . Worried About Charity fundraiser in the Last Year:   . Arboriculturist in the Last Year:   Transportation Needs:   . Film/video editor (Medical):   Marland Kitchen Lack of Transportation (Non-Medical):   Physical Activity:   . Days of Exercise per Week:   . Minutes of Exercise per Session:   Stress:   . Feeling of Stress :   Social Connections:   . Frequency of Communication with Friends and Family:   . Frequency of Social Gatherings with Friends and Family:   . Attends Religious Services:   . Active Member of Clubs or Organizations:   . Attends Archivist Meetings:   Marland Kitchen Marital Status:   Intimate Partner Violence:   . Fear of Current or Ex-Partner:   . Emotionally Abused:   Marland Kitchen Physically Abused:   . Sexually Abused:    Family History  Adopted: Yes  Problem Relation Age of Onset  . Heart disease Mother   . Diabetes Mother   . Hyperlipidemia Mother   . Hypertension Mother   . Heart attack Mother   . Heart disease Father   . Diabetes Father   . Heart attack Father   . Hypertension Father   . Bladder Cancer Father   . Diabetes  Brother   . Lung cancer Paternal Grandfather   . Colon cancer Neg Hx   . Colon polyps Neg Hx   . Rectal cancer Neg Hx   . Stomach cancer Neg Hx       Review of Systems  All other systems reviewed and are negative.      Objective:   Physical Exam  Constitutional: She appears well-developed and well-nourished. No distress.  Cardiovascular: Regular rhythm and intact distal pulses. Tachycardia present.  Murmur heard. Pulmonary/Chest: Effort normal. No respiratory distress. She has no wheezes. She has no rales.  Abdominal: Soft. Bowel sounds are normal. She exhibits no mass. There is no abdominal tenderness. There is no rebound and no guarding.  Genitourinary: Uterus is not enlarged. Cervix exhibits no motion tenderness. Right adnexum displays no mass. Left adnexum displays no mass.  Neurological: She exhibits normal muscle tone. Coordination normal.  Skin: She is not diaphoretic.  Vitals reviewed.         Assessment & Plan:  PVD (peripheral vascular disease) with claudication ([place]) - Plan: CBC with Differential/Platelet, COMPLETE METABOLIC PANEL WITH GFR, Lipid panel  Essential hypertension - Plan: CBC with Differential/Platelet, COMPLETE METABOLIC PANEL WITH GFR, Lipid panel  Pure hypercholesterolemia - Plan: CBC with Differential/Platelet, COMPLETE METABOLIC PANEL WITH GFR, Lipid panel  Prediabetes - Plan: Hemoglobin A1c  Encounter for screening mammogram for malignant neoplasm of breast - Plan: MM Digital Screening I will schedule the patient for a mammogram to screen for breast cancer.  Offered the patient HIV testing along with hepatitis C testing which she declined.  Offered the patient flu  shot along with tetanus shot but she declined.  I strongly recommended COVID-19 vaccination and provided her the contact information to schedule this.  Her blood pressure today is adequately controlled at 138/82.  Given her history of peripheral vascular disease I will check a CMP and a  fasting lipid panel.  Her goal LDL cholesterol is less than 70.  I will also check a hemoglobin A1c to monitor her prediabetes given her blurry vision and dry mouth.  Strongly recommended smoking cessation.  I will refill the patient's tramadol which she takes for chronic bilateral shoulder pain due to rotator cuff tears along with her Flexeril given the fact she is unable to tolerate NSAIDs due to her history of peptic ulcer disease.

## 2020-02-14 LAB — CBC WITH DIFFERENTIAL/PLATELET
Absolute Monocytes: 493 cells/uL (ref 200–950)
Basophils Absolute: 38 cells/uL (ref 0–200)
Basophils Relative: 0.6 %
Eosinophils Absolute: 128 cells/uL (ref 15–500)
Eosinophils Relative: 2 %
HCT: 41.5 % (ref 35.0–45.0)
Hemoglobin: 13.8 g/dL (ref 11.7–15.5)
Lymphs Abs: 1882 cells/uL (ref 850–3900)
MCH: 29.5 pg (ref 27.0–33.0)
MCHC: 33.3 g/dL (ref 32.0–36.0)
MCV: 88.7 fL (ref 80.0–100.0)
MPV: 10.1 fL (ref 7.5–12.5)
Monocytes Relative: 7.7 %
Neutro Abs: 3859 cells/uL (ref 1500–7800)
Neutrophils Relative %: 60.3 %
Platelets: 297 10*3/uL (ref 140–400)
RBC: 4.68 10*6/uL (ref 3.80–5.10)
RDW: 13.6 % (ref 11.0–15.0)
Total Lymphocyte: 29.4 %
WBC: 6.4 10*3/uL (ref 3.8–10.8)

## 2020-02-14 LAB — COMPLETE METABOLIC PANEL WITH GFR
AG Ratio: 1.6 (calc) (ref 1.0–2.5)
ALT: 22 U/L (ref 6–29)
AST: 20 U/L (ref 10–35)
Albumin: 4.1 g/dL (ref 3.6–5.1)
Alkaline phosphatase (APISO): 67 U/L (ref 37–153)
BUN: 16 mg/dL (ref 7–25)
CO2: 31 mmol/L (ref 20–32)
Calcium: 9.3 mg/dL (ref 8.6–10.4)
Chloride: 103 mmol/L (ref 98–110)
Creat: 0.87 mg/dL (ref 0.50–1.05)
GFR, Est African American: 86 mL/min/{1.73_m2} (ref >=60)
GFR, Est Non African American: 74 mL/min/{1.73_m2} (ref >=60)
Globulin: 2.6 g/dL (calc) (ref 1.9–3.7)
Glucose, Bld: 155 mg/dL — ABNORMAL HIGH (ref 65–99)
Potassium: 3.9 mmol/L (ref 3.5–5.3)
Sodium: 141 mmol/L (ref 135–146)
Total Bilirubin: 0.4 mg/dL (ref 0.2–1.2)
Total Protein: 6.7 g/dL (ref 6.1–8.1)

## 2020-02-14 LAB — HEMOGLOBIN A1C
Hgb A1c MFr Bld: 6.4 % of total Hgb — ABNORMAL HIGH (ref <5.7)
Mean Plasma Glucose: 137 (calc)
eAG (mmol/L): 7.6 (calc)

## 2020-02-14 LAB — LIPID PANEL
Cholesterol: 152 mg/dL (ref <200)
HDL: 31 mg/dL — ABNORMAL LOW (ref >=50)
LDL Cholesterol (Calc): 93 mg/dL (calc)
Non-HDL Cholesterol (Calc): 121 mg/dL (calc) (ref <130)
Total CHOL/HDL Ratio: 4.9 (calc) (ref <5.0)
Triglycerides: 184 mg/dL — ABNORMAL HIGH (ref <150)

## 2020-02-22 ENCOUNTER — Ambulatory Visit: Payer: Medicare PPO

## 2020-02-22 ENCOUNTER — Ambulatory Visit (HOSPITAL_COMMUNITY): Payer: Medicare PPO

## 2020-02-29 ENCOUNTER — Ambulatory Visit
Admission: RE | Admit: 2020-02-29 | Discharge: 2020-02-29 | Disposition: A | Discharge status: Home / Self Care | Payer: Medicare PPO | Source: Ambulatory Visit | Attending: Family Medicine | Admitting: Family Medicine

## 2020-02-29 ENCOUNTER — Other Ambulatory Visit: Payer: Self-pay

## 2020-02-29 DIAGNOSIS — Z1231 Encounter for screening mammogram for malignant neoplasm of breast: Secondary | ICD-10-CM | POA: Diagnosis not present

## 2020-03-18 ENCOUNTER — Other Ambulatory Visit: Payer: Self-pay | Admitting: Family Medicine

## 2020-03-18 ENCOUNTER — Other Ambulatory Visit: Payer: Self-pay | Admitting: *Deleted

## 2020-03-18 MED ORDER — ATORVASTATIN CALCIUM 80 MG PO TABS: ORAL_TABLET | ORAL | 3 refills | Status: DC

## 2020-03-18 NOTE — Telephone Encounter (Signed)
Ok to refill?  Medication is not on list.

## 2020-04-03 ENCOUNTER — Other Ambulatory Visit: Payer: Self-pay | Admitting: Family Medicine

## 2020-04-03 DIAGNOSIS — M5431 Sciatica, right side: Secondary | ICD-10-CM

## 2020-04-03 MED ORDER — CYCLOBENZAPRINE HCL 10 MG PO TABS: ORAL_TABLET | ORAL | 1 refills | Status: DC

## 2020-04-16 ENCOUNTER — Other Ambulatory Visit: Payer: Self-pay | Admitting: Family Medicine

## 2020-04-16 NOTE — Telephone Encounter (Signed)
Last refilled: 03/18/2020 Last office visit: 02/13/2020

## 2020-05-17 ENCOUNTER — Other Ambulatory Visit: Payer: Self-pay | Admitting: Family Medicine

## 2020-05-17 DIAGNOSIS — M5431 Sciatica, right side: Secondary | ICD-10-CM

## 2020-05-17 NOTE — Telephone Encounter (Signed)
Ok to refill 

## 2020-05-20 ENCOUNTER — Other Ambulatory Visit: Payer: Self-pay | Admitting: Family Medicine

## 2020-06-03 ENCOUNTER — Other Ambulatory Visit: Payer: Self-pay | Admitting: Family Medicine

## 2020-06-04 NOTE — Telephone Encounter (Signed)
Ok to refill??  Last office visit 02/13/2020.  Last refill 1/14/221, #2 refills.

## 2020-06-06 ENCOUNTER — Ambulatory Visit
Admission: RE | Admit: 2020-06-06 | Discharge: 2020-06-06 | Disposition: A | Discharge status: Home / Self Care | Payer: Medicare PPO | Source: Ambulatory Visit | Attending: Family Medicine | Admitting: Family Medicine

## 2020-06-06 ENCOUNTER — Other Ambulatory Visit: Payer: Self-pay

## 2020-06-06 ENCOUNTER — Ambulatory Visit: Payer: Medicare PPO | Admitting: Family Medicine

## 2020-06-06 VITALS — BP 130/60 | HR 97 | Temp 98.6°F | Ht <= 58 in | Wt 144.0 lb

## 2020-06-06 DIAGNOSIS — R091 Pleurisy: Secondary | ICD-10-CM | POA: Diagnosis not present

## 2020-06-06 DIAGNOSIS — R0602 Shortness of breath: Secondary | ICD-10-CM | POA: Diagnosis not present

## 2020-06-06 DIAGNOSIS — R05 Cough: Secondary | ICD-10-CM | POA: Diagnosis not present

## 2020-06-06 MED ORDER — OXYCODONE-ACETAMINOPHEN 5-325 MG PO TABS: 1.0000 | ORAL_TABLET | Freq: Four times a day (QID) | ORAL | 0 refills | Status: DC | PRN

## 2020-06-06 NOTE — Progress Notes (Signed)
Subjective:    Patient ID: Felicia [name], female    DOB: 1966-08-29, 54 y.o.   MRN: [id number]  HPI  Patient is in intense pain.  She reports the pain in a dermatomal pattern around the bottom of her ribs on the right side.  The pain began yesterday.  She believes that she may have injured herself rolling to get up from a beanbag chair.  It hurts to take a deep breath then.  It hurts to move.  It hurts to twist.  It hurts to cough.  Palpation of the ribs elicits severe pain.  She denies any hemoptysis.  She does have a cough due to smoking.  She denies any fevers or chills.  Breath sounds are diminished on the right side.  She denies any nausea or vomiting or melena or hematochezia.  Food does not seem to trigger the pain.  Abdomen is soft nondistended with normal bowel sounds Past Medical History:  Diagnosis Date  . Allergy    year around  . Anemia   . Anxiety   . Arthritis   . Asthma   . Atrial fibrillation (HCC)   . Bipolar 1 disorder (HCC)   . Borderline diabetes   . Depression   . Early cataract    right  . Ectopic pregnancy   . Fatty liver   . Headache   . Heart murmur   . Hyperlipidemia   . Insomnia   . Peripheral arterial disease (HCC)   . Pneumonia   . PUD (peptic ulcer disease)   . Right knee pain 12-05-2015   per pt.  she fell out of her husband's truck and landed on her right knee  . Shortness of breath dyspnea   . Wears glasses   . Wears partial dentures    Past Surgical History:  Procedure Laterality Date  . ABDOMINAL AORTAGRAM N/A 09/12/2012   Procedure: ABDOMINAL AORTAGRAM;  Surgeon: Fransisco Hertz, MD;  Location: [facility] CATH LAB;  Service: Cardiovascular;  Laterality: N/A;  . AORTA - BILATERAL FEMORAL ARTERY BYPASS GRAFT N/A 10/23/2014   Procedure: AORTOBIFEMORAL BYPASS GRAFT;  Surgeon: Chuck Hint, MD;  Location: [facility] OR;  Service: Vascular;  Laterality: N/A;  . BACK SURGERY    . CESAREAN SECTION    . COLONOSCOPY  01-14-2004   > 11 yr ago Dr Loreta Ave -  normal - report in epic   . DILATATION & CURETTAGE/HYSTEROSCOPY WITH MYOSURE N/A 10/15/2015   Procedure: DILATATION & CURETTAGE/HYSTEROSCOPY WITH MYOSURE;  Surgeon: Ok Edwards, MD;  Location: [facility] ORS;  Service: Gynecology;  Laterality: N/A;  . ESOPHAGOGASTRODUODENOSCOPY  01-14-2004   gastritis- dr Loreta Ave- in epic already   . MULTIPLE TOOTH EXTRACTIONS    . NOSE SURGERY    . NOSE SURGERY    . SHOULDER ARTHROSCOPY WITH SUBACROMIAL DECOMPRESSION, ROTATOR CUFF REPAIR AND BICEP TENDON REPAIR Left 09/26/2019   Procedure: left shoulder arthroscopy, biceps release and tenodesis, mini open rotator cuff tear repair subscapularis and supraspinatus;  Surgeon: Cammy Copa, MD;  Location: [facility] OR;  Service: Orthopedics;  Laterality: Left;  . SHOULDER SURGERY     rotator cuff repair   . TONSILLECTOMY    . TUBAL LIGATION    . UPPER GASTROINTESTINAL ENDOSCOPY  01-14-2004   in epic- dr Loreta Ave- gastritis    Current Outpatient Medications on File Prior to Visit  Medication Sig Dispense Refill  . albuterol (VENTOLIN HFA) 108 (90 Base) MCG/ACT inhaler Inhale 2 puffs into the lungs every 6 (  six) hours as needed for wheezing or shortness of breath. 18 g 3  . aspirin EC 81 MG tablet Take 81 mg by mouth daily.    Marland Kitchen atorvastatin (LIPITOR) 80 MG tablet TAKE 1 TABLET BY MOUTH EVERY DAY 90 tablet 3  . clonazePAM (KLONOPIN) 1 MG tablet TAKE 1 TABLET (1 MG TOTAL) BY MOUTH 3 (THREE) TIMES DAILY. 90 tablet 2  . cyclobenzaprine (FLEXERIL) 10 MG tablet TAKE 1 TABLET BY MOUTH THREE TIMES A DAY AS NEEDED FOR MUSCLE SPASM 90 tablet 1  . dicyclomine (BENTYL) 20 MG tablet TAKE 1 TABLET (20 MG TOTAL) BY MOUTH 3 (THREE) TIMES DAILY WITH MEALS AS NEEDED FOR SPASMS. 20 tablet 0  . diphenhydrAMINE (BENADRYL) 25 MG tablet Take 50 mg by mouth every 6 (six) hours as needed for allergies.    Marland Kitchen EPIPEN 2-PAK 0.3 MG/0.3ML SOAJ injection USE AS DIRECTED (Patient taking differently: Inject 0.3 mg into the muscle as needed for anaphylaxis. )  2 Device 0  . hydrochlorothiazide (HYDRODIURIL) 25 MG tablet Take 1 tablet (25 mg total) by mouth daily. 90 tablet 3  . metoprolol succinate (TOPROL-XL) 50 MG 24 hr tablet TAKE 1 TABLET BY MOUTH EVERY DAY 90 tablet 1  . naproxen sodium (ALEVE) 220 MG tablet Take 440 mg by mouth 2 (two) times daily as needed (pain).    . ondansetron (ZOFRAN) 4 MG tablet Take 1 tablet (4 mg total) by mouth every 8 (eight) hours as needed for nausea or vomiting. 20 tablet 0  . traMADol (ULTRAM) 50 MG tablet TAKE 2 TABLETS BY MOUTH EVERY 8 HOURS AS NEEDED 180 tablet 0  . fluticasone (FLONASE) 50 MCG/ACT nasal spray Place 1 spray into both nostrils daily for 14 days. 16 g 0   No current facility-administered medications on file prior to visit.   Allergies  Allergen Reactions  . Bee Pollen Swelling and Other (See Comments)    Also ticks   . Bactrim [Sulfamethoxazole-Trimethoprim] Hives  . Cephalosporins Hives  . Doxycycline Hives  . Tetracyclines & Related Hives  . Other Itching    Nuts causes itching and hives not sure what type  . Augmentin [Amoxicillin-Pot Clavulanate] Diarrhea  . Codeine Nausea Only  . Reflex Pain Relief [Menthol] Rash    Muscle Rub   Social History   Socioeconomic History  . Marital status: Married    Spouse name: Not on file  . Number of children: Not on file  . Years of education: Not on file  . Highest education level: Not on file  Occupational History  . Not on file  Tobacco Use  . Smoking status: Current Every Day Smoker    Packs/day: 0.50    Years: 30.00    Pack years: 15.00    Types: Cigarettes  . Smokeless tobacco: Never Used  Vaping Use  . Vaping Use: Never used  Substance and Sexual Activity  . Alcohol use: Yes    Alcohol/week: 1.0 standard drink    Types: 1 Shots of liquor per week    Comment: very occassionally on social occassions  . Drug use: No  . Sexual activity: Yes  Other Topics Concern  . Not on file  Social History Narrative  . Not on file    Social Determinants of Health   Financial Resource Strain:   . Difficulty of Paying Living Expenses:   Food Insecurity:   . Worried About Programme researcher, broadcasting/film/video in the Last Year:   . [employer] of Food in the  Last Year:   Transportation Needs:   . Freight forwarder (Medical):   Marland Kitchen Lack of Transportation (Non-Medical):   Physical Activity:   . Days of Exercise per Week:   . Minutes of Exercise per Session:   Stress:   . Feeling of Stress :   Social Connections:   . Frequency of Communication with Friends and Family:   . Frequency of Social Gatherings with Friends and Family:   . Attends Religious Services:   . Active Member of Clubs or Organizations:   . Attends Banker Meetings:   Marland Kitchen Marital Status:   Intimate Partner Violence:   . Fear of Current or Ex-Partner:   . Emotionally Abused:   Marland Kitchen Physically Abused:   . Sexually Abused:    Family History  Adopted: Yes  Problem Relation Age of Onset  . Heart disease Mother   . Diabetes Mother   . Hyperlipidemia Mother   . Hypertension Mother   . Heart attack Mother   . Heart disease Father   . Diabetes Father   . Heart attack Father   . Hypertension Father   . Bladder Cancer Father   . Diabetes Brother   . Lung cancer Paternal Grandfather   . Colon cancer Neg Hx   . Colon polyps Neg Hx   . Rectal cancer Neg Hx   . Stomach cancer Neg Hx       Review of Systems  All other systems reviewed and are negative.      Objective:   Physical Exam Vitals reviewed.  Constitutional:      General: She is not in acute distress.    Appearance: She is well-developed. She is not diaphoretic.  Cardiovascular:     Rate and Rhythm: Normal rate and regular rhythm.     Heart sounds: Murmur heard.   Pulmonary:     Effort: Pulmonary effort is normal. No respiratory distress.     Breath sounds: Examination of the right-middle field reveals decreased breath sounds. Examination of the right-lower field reveals decreased  breath sounds. Decreased breath sounds present. No wheezing or rales.  Chest:     Chest wall: Tenderness present. No crepitus.    Abdominal:     General: Bowel sounds are normal.     Palpations: Abdomen is soft. There is no mass.     Tenderness: There is no abdominal tenderness. There is no guarding or rebound.           Assessment & Plan:  Pleurisy - Plan: DG Chest 2 View  Patient is splinting due to pain and therefore she has diminished breath sounds on the right side.  I will obtain a chest x-ray to rule out a pneumothorax however I believe the patient most likely broke a rib on the right side and is having musculoskeletal chest wall pain.  If the chest x-ray is normal I would recommend tramadol be discontinued temporarily and replaced with Percocet 5/325 1 to 2 tablets every 6 hours as needed for severe pain.  Await the results of the chest x-ray

## 2020-06-07 ENCOUNTER — Telehealth: Payer: Self-pay | Admitting: Family Medicine

## 2020-06-07 NOTE — Telephone Encounter (Signed)
CB# [phone] Call for xray results

## 2020-06-10 NOTE — Telephone Encounter (Signed)
Have not been realeased

## 2020-06-10 NOTE — Telephone Encounter (Signed)
CB# [phone] Pt calling for xray results

## 2020-06-11 NOTE — Telephone Encounter (Signed)
Please see imaging for further information.

## 2020-06-15 ENCOUNTER — Other Ambulatory Visit: Payer: Self-pay | Admitting: Family Medicine

## 2020-06-28 ENCOUNTER — Other Ambulatory Visit: Payer: Self-pay

## 2020-06-28 MED ORDER — TRAMADOL HCL 50 MG PO TABS: ORAL_TABLET | ORAL | 0 refills | Status: DC

## 2020-07-08 ENCOUNTER — Other Ambulatory Visit: Payer: Medicare PPO

## 2020-07-08 ENCOUNTER — Other Ambulatory Visit: Payer: Self-pay

## 2020-07-08 DIAGNOSIS — N3001 Acute cystitis with hematuria: Secondary | ICD-10-CM | POA: Diagnosis not present

## 2020-07-08 LAB — URINALYSIS, ROUTINE W REFLEX MICROSCOPIC
Bilirubin Urine: NEGATIVE
Glucose, UA: NEGATIVE
Hyaline Cast: NONE SEEN /LPF
Ketones, ur: NEGATIVE
Leukocytes,Ua: NEGATIVE
Nitrite: POSITIVE — AB
Specific Gravity, Urine: 1.02 (ref 1.001–1.03)
WBC, UA: NONE SEEN /HPF (ref 0–5)
pH: 6 (ref 5.0–8.0)

## 2020-07-08 LAB — MICROSCOPIC MESSAGE

## 2020-07-09 ENCOUNTER — Other Ambulatory Visit: Payer: Self-pay

## 2020-07-09 ENCOUNTER — Other Ambulatory Visit: Payer: Self-pay | Admitting: Family Medicine

## 2020-07-09 MED ORDER — CIPROFLOXACIN HCL 500 MG PO TABS: 500.0000 mg | ORAL_TABLET | Freq: Two times a day (BID) | ORAL | 0 refills | Status: DC

## 2020-07-11 ENCOUNTER — Ambulatory Visit: Payer: Medicare PPO | Admitting: Family Medicine

## 2020-07-11 ENCOUNTER — Other Ambulatory Visit: Payer: Self-pay

## 2020-07-11 VITALS — BP 126/66 | HR 102 | Temp 96.6°F | Ht <= 58 in | Wt 138.0 lb

## 2020-07-11 DIAGNOSIS — R0781 Pleurodynia: Secondary | ICD-10-CM | POA: Diagnosis not present

## 2020-07-11 NOTE — Progress Notes (Signed)
Subjective:    Patient ID: Felicia [name], female    DOB: February 29, 1966, 54 y.o.   MRN: [id number]  HPI  06/06/20 Patient is in intense pain.  She reports the pain in a dermatomal pattern around the bottom of her ribs on the right side.  The pain began yesterday.  She believes that she may have injured herself rolling to get up from a beanbag chair.  It hurts to take a deep breath then.  It hurts to move.  It hurts to twist.  It hurts to cough.  Palpation of the ribs elicits severe pain.  She denies any hemoptysis.  She does have a cough due to smoking.  She denies any fevers or chills.  Breath sounds are diminished on the right side.  She denies any nausea or vomiting or melena or hematochezia.  Food does not seem to trigger the pain.  Abdomen is soft nondistended with normal bowel sounds.  At that time, my plan was: Patient is splinting due to pain and therefore she has diminished breath sounds on the right side.  I will obtain a chest x-ray to rule out a pneumothorax however I believe the patient most likely broke a rib on the right side and is having musculoskeletal chest wall pain.  If the chest x-ray is normal I would recommend tramadol be discontinued temporarily and replaced with Percocet 5/325 1 to 2 tablets every 6 hours as needed for severe pain.  Await the results of the chest x-ray  07/11/20 X-ray at that time was negative.  Now the patient has developed similar pain in her left side.  The pain is located on the rib just below her left breast.  It is tender to palpation in that area.  She denies any hemoptysis.  She denies any shortness of breath.  She denies any fevers or chills.  She denies any nausea or vomiting.  She denies any melena or hematochezia.  There is no association of the pain to food.  However she is extremely tender to palpation over the body of the rib just below the left breast Past Medical History:  Diagnosis Date  . Allergy    year around  . Anemia   . Anxiety   .  Arthritis   . Asthma   . Atrial fibrillation (HCC)   . Bipolar 1 disorder (HCC)   . Borderline diabetes   . Depression   . Early cataract    right  . Ectopic pregnancy   . Fatty liver   . Headache   . Heart murmur   . Hyperlipidemia   . Insomnia   . Peripheral arterial disease (HCC)   . Pneumonia   . PUD (peptic ulcer disease)   . Right knee pain 12-05-2015   per pt.  she fell out of her husband's truck and landed on her right knee  . Shortness of breath dyspnea   . Wears glasses   . Wears partial dentures    Past Surgical History:  Procedure Laterality Date  . ABDOMINAL AORTAGRAM N/A 09/12/2012   Procedure: ABDOMINAL AORTAGRAM;  Surgeon: Fransisco Hertz, MD;  Location: [facility] CATH LAB;  Service: Cardiovascular;  Laterality: N/A;  . AORTA - BILATERAL FEMORAL ARTERY BYPASS GRAFT N/A 10/23/2014   Procedure: AORTOBIFEMORAL BYPASS GRAFT;  Surgeon: Chuck Hint, MD;  Location: [facility] OR;  Service: Vascular;  Laterality: N/A;  . BACK SURGERY    . CESAREAN SECTION    . COLONOSCOPY  01-14-2004   >  11 yr ago Dr Loreta Ave - normal - report in epic   . DILATATION & CURETTAGE/HYSTEROSCOPY WITH MYOSURE N/A 10/15/2015   Procedure: DILATATION & CURETTAGE/HYSTEROSCOPY WITH MYOSURE;  Surgeon: Ok Edwards, MD;  Location: [facility] ORS;  Service: Gynecology;  Laterality: N/A;  . ESOPHAGOGASTRODUODENOSCOPY  01-14-2004   gastritis- dr Loreta Ave- in epic already   . MULTIPLE TOOTH EXTRACTIONS    . NOSE SURGERY    . NOSE SURGERY    . SHOULDER ARTHROSCOPY WITH SUBACROMIAL DECOMPRESSION, ROTATOR CUFF REPAIR AND BICEP TENDON REPAIR Left 09/26/2019   Procedure: left shoulder arthroscopy, biceps release and tenodesis, mini open rotator cuff tear repair subscapularis and supraspinatus;  Surgeon: Cammy Copa, MD;  Location: [facility] OR;  Service: Orthopedics;  Laterality: Left;  . SHOULDER SURGERY     rotator cuff repair   . TONSILLECTOMY    . TUBAL LIGATION    . UPPER GASTROINTESTINAL ENDOSCOPY  01-14-2004   in  epic- dr Loreta Ave- gastritis    Current Outpatient Medications on File Prior to Visit  Medication Sig Dispense Refill  . albuterol (VENTOLIN HFA) 108 (90 Base) MCG/ACT inhaler INHALE 2 PUFFS INTO THE LUNGS EVERY 6 HOURS AS NEEDED FOR WHEEZE OR SHORTNESS OF BREATH 18 g 3  . aspirin EC 81 MG tablet Take 81 mg by mouth daily.    Marland Kitchen atorvastatin (LIPITOR) 80 MG tablet TAKE 1 TABLET BY MOUTH EVERY DAY 90 tablet 3  . ciprofloxacin (CIPRO) 500 MG tablet Take 1 tablet (500 mg total) by mouth 2 (two) times daily. 6 tablet 0  . clonazePAM (KLONOPIN) 1 MG tablet TAKE 1 TABLET (1 MG TOTAL) BY MOUTH 3 (THREE) TIMES DAILY. 90 tablet 2  . cyclobenzaprine (FLEXERIL) 10 MG tablet TAKE 1 TABLET BY MOUTH THREE TIMES A DAY AS NEEDED FOR MUSCLE SPASM 90 tablet 1  . hydrochlorothiazide (HYDRODIURIL) 25 MG tablet Take 1 tablet (25 mg total) by mouth daily. 90 tablet 3  . metoprolol succinate (TOPROL-XL) 50 MG 24 hr tablet TAKE 1 TABLET BY MOUTH EVERY DAY 90 tablet 1  . traMADol (ULTRAM) 50 MG tablet TAKE 2 TABLETS BY MOUTH EVERY 8 HOURS AS NEEDED 180 tablet 0  . dicyclomine (BENTYL) 20 MG tablet TAKE 1 TABLET (20 MG TOTAL) BY MOUTH 3 (THREE) TIMES DAILY WITH MEALS AS NEEDED FOR SPASMS. (Patient not taking: Reported on 07/11/2020) 20 tablet 0  . diphenhydrAMINE (BENADRYL) 25 MG tablet Take 50 mg by mouth every 6 (six) hours as needed for allergies. (Patient not taking: Reported on 07/11/2020)    . EPIPEN 2-PAK 0.3 MG/0.3ML SOAJ injection USE AS DIRECTED (Patient not taking: Reported on 07/11/2020) 2 Device 0  . fluticasone (FLONASE) 50 MCG/ACT nasal spray Place 1 spray into both nostrils daily for 14 days. 16 g 0  . naproxen sodium (ALEVE) 220 MG tablet Take 440 mg by mouth 2 (two) times daily as needed (pain). (Patient not taking: Reported on 07/11/2020)    . ondansetron (ZOFRAN) 4 MG tablet Take 1 tablet (4 mg total) by mouth every 8 (eight) hours as needed for nausea or vomiting. (Patient not taking: Reported on 07/11/2020) 20  tablet 0  . oxyCODONE-acetaminophen (PERCOCET) 5-325 MG tablet Take 1-2 tablets by mouth every 6 (six) hours as needed for severe pain (stop tramadol). (Patient not taking: Reported on 07/11/2020) 60 tablet 0   No current facility-administered medications on file prior to visit.   Allergies  Allergen Reactions  . Bee Pollen Swelling and Other (See Comments)    Also  ticks   . Bactrim [Sulfamethoxazole-Trimethoprim] Hives  . Cephalosporins Hives  . Doxycycline Hives  . Tetracyclines & Related Hives  . Other Itching    Nuts causes itching and hives not sure what type  . Augmentin [Amoxicillin-Pot Clavulanate] Diarrhea  . Codeine Nausea Only  . Reflex Pain Relief [Menthol] Rash    Muscle Rub   Social History   Socioeconomic History  . Marital status: Married    Spouse name: Not on file  . Number of children: Not on file  . Years of education: Not on file  . Highest education level: Not on file  Occupational History  . Not on file  Tobacco Use  . Smoking status: Current Every Day Smoker    Packs/day: 0.50    Years: 30.00    Pack years: 15.00    Types: Cigarettes  . Smokeless tobacco: Never Used  Vaping Use  . Vaping Use: Never used  Substance and Sexual Activity  . Alcohol use: Yes    Alcohol/week: 1.0 standard drink    Types: 1 Shots of liquor per week    Comment: very occassionally on social occassions  . Drug use: No  . Sexual activity: Yes  Other Topics Concern  . Not on file  Social History Narrative  . Not on file   Social Determinants of Health   Financial Resource Strain:   . Difficulty of Paying Living Expenses: Not on file  Food Insecurity:   . Worried About Programme researcher, broadcasting/film/video in the Last Year: Not on file  . Ran Out of Food in the Last Year: Not on file  Transportation Needs:   . Lack of Transportation (Medical): Not on file  . Lack of Transportation (Non-Medical): Not on file  Physical Activity:   . Days of Exercise per Week: Not on file  .  Minutes of Exercise per Session: Not on file  Stress:   . Feeling of Stress : Not on file  Social Connections:   . Frequency of Communication with Friends and Family: Not on file  . Frequency of Social Gatherings with Friends and Family: Not on file  . Attends Religious Services: Not on file  . Active Member of Clubs or Organizations: Not on file  . Attends Banker Meetings: Not on file  . Marital Status: Not on file  Intimate Partner Violence:   . Fear of Current or Ex-Partner: Not on file  . Emotionally Abused: Not on file  . Physically Abused: Not on file  . Sexually Abused: Not on file   Family History  Adopted: Yes  Problem Relation Age of Onset  . Heart disease Mother   . Diabetes Mother   . Hyperlipidemia Mother   . Hypertension Mother   . Heart attack Mother   . Heart disease Father   . Diabetes Father   . Heart attack Father   . Hypertension Father   . Bladder Cancer Father   . Diabetes Brother   . Lung cancer Paternal Grandfather   . Colon cancer Neg Hx   . Colon polyps Neg Hx   . Rectal cancer Neg Hx   . Stomach cancer Neg Hx       Review of Systems  All other systems reviewed and are negative.      Objective:   Physical Exam Vitals reviewed.  Constitutional:      General: She is not in acute distress.    Appearance: She is well-developed. She is not diaphoretic.  Cardiovascular:     Rate and Rhythm: Normal rate and regular rhythm.     Heart sounds: Murmur heard.   Pulmonary:     Effort: Pulmonary effort is normal. No respiratory distress.     Breath sounds: Examination of the right-middle field reveals decreased breath sounds. Examination of the right-lower field reveals decreased breath sounds. Decreased breath sounds present. No wheezing or rales.  Chest:     Chest wall: Tenderness present. No crepitus.    Abdominal:     General: Bowel sounds are normal.     Palpations: Abdomen is soft. There is no mass.     Tenderness: There  is no abdominal tenderness. There is no guarding or rebound.           Assessment & Plan:  Rib pain on left side - Plan: DG Ribs Unilateral Left  Pain is reproducible with palpation along the rib body.  I definitely believe this is musculoskeletal.  Proceed with an x-ray of the ribs to evaluate further.  I do not believe that this is GI in nature

## 2020-07-22 ENCOUNTER — Other Ambulatory Visit: Payer: Self-pay | Admitting: Family Medicine

## 2020-07-22 ENCOUNTER — Ambulatory Visit (INDEPENDENT_AMBULATORY_CARE_PROVIDER_SITE_OTHER): Payer: Medicare PPO | Admitting: Family Medicine

## 2020-07-22 ENCOUNTER — Ambulatory Visit
Admission: RE | Admit: 2020-07-22 | Discharge: 2020-07-22 | Disposition: A | Discharge status: Home / Self Care | Payer: Medicare PPO | Source: Ambulatory Visit | Attending: Family Medicine | Admitting: Family Medicine

## 2020-07-22 ENCOUNTER — Other Ambulatory Visit: Payer: Self-pay

## 2020-07-22 VITALS — BP 110/60 | HR 85 | Temp 98.2°F | Ht <= 58 in | Wt 140.0 lb

## 2020-07-22 DIAGNOSIS — M546 Pain in thoracic spine: Secondary | ICD-10-CM | POA: Diagnosis not present

## 2020-07-22 DIAGNOSIS — R0781 Pleurodynia: Secondary | ICD-10-CM | POA: Diagnosis not present

## 2020-07-22 DIAGNOSIS — I700 Atherosclerosis of aorta: Secondary | ICD-10-CM | POA: Diagnosis not present

## 2020-07-22 NOTE — Progress Notes (Signed)
Subjective:    Patient ID: Felicia [name], female    DOB: [birth year], 54 y.o.   MRN: [id number]  HPI  06/06/20 Patient is in intense pain.  She reports the pain in a dermatomal pattern around the bottom of her ribs on the right side.  The pain began yesterday.  She believes that she may have injured herself rolling to get up from a beanbag chair.  It hurts to take a deep breath then.  It hurts to move.  It hurts to twist.  It hurts to cough.  Palpation of the ribs elicits severe pain.  She denies any hemoptysis.  She does have a cough due to smoking.  She denies any fevers or chills.  Breath sounds are diminished on the right side.  She denies any nausea or vomiting or melena or hematochezia.  Food does not seem to trigger the pain.  Abdomen is soft nondistended with normal bowel sounds.  At that time, my plan was: Patient is splinting due to pain and therefore she has diminished breath sounds on the right side.  I will obtain a chest x-ray to rule out a pneumothorax however I believe the patient most likely broke a rib on the right side and is having musculoskeletal chest wall pain.  If the chest x-ray is normal I would recommend tramadol be discontinued temporarily and replaced with Percocet 5/325 1 to 2 tablets every 6 hours as needed for severe pain.  Await the results of the chest x-ray  07/11/20 X-ray at that time was negative.  Now the patient has developed similar pain in her left side.  The pain is located on the rib just below her left breast.  It is tender to palpation in that area.  She denies any hemoptysis.  She denies any shortness of breath.  She denies any fevers or chills.  She denies any nausea or vomiting.  She denies any melena or hematochezia.  There is no association of the pain to food.  However she is extremely tender to palpation over the body of the rib just below the left breast.  At that time, my plan was: Pain is reproducible with palpation along the rib body.  I definitely  believe this is musculoskeletal.  Proceed with an x-ray of the ribs to evaluate further.  I do not believe that this is GI in nature  07/22/20 Patient presents today continuing to complain of pain on the left side.  The pain is located at the exact same spot as before.  It is roughly at the level of T7 or T8.  It radiates from the center of her back around through her mid axillary line into her sternum.  Is a sharp burning pain.  Palpation of the rib and intercostal muscle reproduces the pain exactly.  It hurts to cough.  It hurts to take a deep breath then.  There is no rash or evidence of shingles.  There is no erythema.  She denies any shortness of breath.  She denies any hemoptysis.  She denies any fevers or chills. Past Medical History:  Diagnosis Date  . Allergy    year around  . Anemia   . Anxiety   . Arthritis   . Asthma   . Atrial fibrillation (HCC)   . Bipolar 1 disorder (HCC)   . Borderline diabetes   . Depression   . Early cataract    right  . Ectopic pregnancy   . Fatty liver   .  Headache   . Heart murmur   . Hyperlipidemia   . Insomnia   . Peripheral arterial disease (HCC)   . Pneumonia   . PUD (peptic ulcer disease)   . Right knee pain 12-05-2015   per pt.  she fell out of her husband's truck and landed on her right knee  . Shortness of breath dyspnea   . Wears glasses   . Wears partial dentures    Past Surgical History:  Procedure Laterality Date  . ABDOMINAL AORTAGRAM N/A 09/12/2012   Procedure: ABDOMINAL AORTAGRAM;  Surgeon: Fransisco Hertz, MD;  Location: [facility] CATH LAB;  Service: Cardiovascular;  Laterality: N/A;  . AORTA - BILATERAL FEMORAL ARTERY BYPASS GRAFT N/A 10/23/2014   Procedure: AORTOBIFEMORAL BYPASS GRAFT;  Surgeon: Chuck Hint, MD;  Location: [facility] OR;  Service: Vascular;  Laterality: N/A;  . BACK SURGERY    . CESAREAN SECTION    . COLONOSCOPY  01-14-2004   > 11 yr ago Dr Loreta Ave - normal - report in epic   . DILATATION & CURETTAGE/HYSTEROSCOPY  WITH MYOSURE N/A 10/15/2015   Procedure: DILATATION & CURETTAGE/HYSTEROSCOPY WITH MYOSURE;  Surgeon: Ok Edwards, MD;  Location: [facility] ORS;  Service: Gynecology;  Laterality: N/A;  . ESOPHAGOGASTRODUODENOSCOPY  01-14-2004   gastritis- dr Loreta Ave- in epic already   . MULTIPLE TOOTH EXTRACTIONS    . NOSE SURGERY    . NOSE SURGERY    . SHOULDER ARTHROSCOPY WITH SUBACROMIAL DECOMPRESSION, ROTATOR CUFF REPAIR AND BICEP TENDON REPAIR Left 09/26/2019   Procedure: left shoulder arthroscopy, biceps release and tenodesis, mini open rotator cuff tear repair subscapularis and supraspinatus;  Surgeon: Cammy Copa, MD;  Location: [facility] OR;  Service: Orthopedics;  Laterality: Left;  . SHOULDER SURGERY     rotator cuff repair   . TONSILLECTOMY    . TUBAL LIGATION    . UPPER GASTROINTESTINAL ENDOSCOPY  01-14-2004   in epic- dr Loreta Ave- gastritis    Current Outpatient Medications on File Prior to Visit  Medication Sig Dispense Refill  . albuterol (VENTOLIN HFA) 108 (90 Base) MCG/ACT inhaler INHALE 2 PUFFS INTO THE LUNGS EVERY 6 HOURS AS NEEDED FOR WHEEZE OR SHORTNESS OF BREATH 18 g 3  . aspirin EC 81 MG tablet Take 81 mg by mouth daily.    Marland Kitchen atorvastatin (LIPITOR) 80 MG tablet TAKE 1 TABLET BY MOUTH EVERY DAY 90 tablet 3  . ciprofloxacin (CIPRO) 500 MG tablet Take 1 tablet (500 mg total) by mouth 2 (two) times daily. 6 tablet 0  . clonazePAM (KLONOPIN) 1 MG tablet TAKE 1 TABLET (1 MG TOTAL) BY MOUTH 3 (THREE) TIMES DAILY. 90 tablet 2  . cyclobenzaprine (FLEXERIL) 10 MG tablet TAKE 1 TABLET BY MOUTH THREE TIMES A DAY AS NEEDED FOR MUSCLE SPASM 90 tablet 1  . dicyclomine (BENTYL) 20 MG tablet TAKE 1 TABLET (20 MG TOTAL) BY MOUTH 3 (THREE) TIMES DAILY WITH MEALS AS NEEDED FOR SPASMS. (Patient not taking: Reported on 07/11/2020) 20 tablet 0  . diphenhydrAMINE (BENADRYL) 25 MG tablet Take 50 mg by mouth every 6 (six) hours as needed for allergies. (Patient not taking: Reported on 07/11/2020)    . EPIPEN 2-PAK 0.3  MG/0.3ML SOAJ injection USE AS DIRECTED (Patient not taking: Reported on 07/11/2020) 2 Device 0  . fluticasone (FLONASE) 50 MCG/ACT nasal spray Place 1 spray into both nostrils daily for 14 days. 16 g 0  . hydrochlorothiazide (HYDRODIURIL) 25 MG tablet Take 1 tablet (25 mg total) by mouth daily. 90 tablet  3  . metoprolol succinate (TOPROL-XL) 50 MG 24 hr tablet TAKE 1 TABLET BY MOUTH EVERY DAY 90 tablet 1  . naproxen sodium (ALEVE) 220 MG tablet Take 440 mg by mouth 2 (two) times daily as needed (pain). (Patient not taking: Reported on 07/11/2020)    . ondansetron (ZOFRAN) 4 MG tablet Take 1 tablet (4 mg total) by mouth every 8 (eight) hours as needed for nausea or vomiting. (Patient not taking: Reported on 07/11/2020) 20 tablet 0  . oxyCODONE-acetaminophen (PERCOCET) 5-325 MG tablet Take 1-2 tablets by mouth every 6 (six) hours as needed for severe pain (stop tramadol). (Patient not taking: Reported on 07/11/2020) 60 tablet 0  . traMADol (ULTRAM) 50 MG tablet TAKE 2 TABLETS BY MOUTH EVERY 8 HOURS AS NEEDED 180 tablet 0   No current facility-administered medications on file prior to visit.   Allergies  Allergen Reactions  . Bee Pollen Swelling and Other (See Comments)    Also ticks   . Bactrim [Sulfamethoxazole-Trimethoprim] Hives  . Cephalosporins Hives  . Doxycycline Hives  . Tetracyclines & Related Hives  . Other Itching    Nuts causes itching and hives not sure what type  . Augmentin [Amoxicillin-Pot Clavulanate] Diarrhea  . Codeine Nausea Only  . Reflex Pain Relief [Menthol] Rash    Muscle Rub   Social History   Socioeconomic History  . Marital status: Married    Spouse name: Not on file  . Number of children: Not on file  . Years of education: Not on file  . Highest education level: Not on file  Occupational History  . Not on file  Tobacco Use  . Smoking status: Current Every Day Smoker    Packs/day: 0.50    Years: 30.00    Pack years: 15.00    Types: Cigarettes  .  Smokeless tobacco: Never Used  Vaping Use  . Vaping Use: Never used  Substance and Sexual Activity  . Alcohol use: Yes    Alcohol/week: 1.0 standard drink    Types: 1 Shots of liquor per week    Comment: very occassionally on social occassions  . Drug use: No  . Sexual activity: Yes  Other Topics Concern  . Not on file  Social History Narrative  . Not on file   Social Determinants of Health   Financial Resource Strain:   . Difficulty of Paying Living Expenses: Not on file  Food Insecurity:   . Worried About Programme researcher, broadcasting/film/videounning Out of Food in the Last Year: Not on file  . Ran Out of Food in the Last Year: Not on file  Transportation Needs:   . Lack of Transportation (Medical): Not on file  . Lack of Transportation (Non-Medical): Not on file  Physical Activity:   . Days of Exercise per Week: Not on file  . Minutes of Exercise per Session: Not on file  Stress:   . Feeling of Stress : Not on file  Social Connections:   . Frequency of Communication with Friends and Family: Not on file  . Frequency of Social Gatherings with Friends and Family: Not on file  . Attends Religious Services: Not on file  . Active Member of Clubs or Organizations: Not on file  . Attends BankerClub or Organization Meetings: Not on file  . Marital Status: Not on file  Intimate Partner Violence:   . Fear of Current or Ex-Partner: Not on file  . Emotionally Abused: Not on file  . Physically Abused: Not on file  . Sexually Abused:  Not on file   Family History  Adopted: Yes  Problem Relation Age of Onset  . Heart disease Mother   . Diabetes Mother   . Hyperlipidemia Mother   . Hypertension Mother   . Heart attack Mother   . Heart disease Father   . Diabetes Father   . Heart attack Father   . Hypertension Father   . Bladder Cancer Father   . Diabetes Brother   . Lung cancer Paternal Grandfather   . Colon cancer Neg Hx   . Colon polyps Neg Hx   . Rectal cancer Neg Hx   . Stomach cancer Neg Hx       Review  of Systems  All other systems reviewed and are negative.      Objective:   Physical Exam Vitals reviewed.  Constitutional:      General: She is not in acute distress.    Appearance: She is well-developed. She is not diaphoretic.  Cardiovascular:     Rate and Rhythm: Normal rate and regular rhythm.     Heart sounds: Murmur heard.   Pulmonary:     Effort: Pulmonary effort is normal. No respiratory distress.     Breath sounds: Examination of the right-middle field reveals decreased breath sounds. Examination of the right-lower field reveals decreased breath sounds. Decreased breath sounds present. No wheezing or rales.  Chest:     Chest wall: Tenderness present. No crepitus.    Abdominal:     General: Bowel sounds are normal.     Palpations: Abdomen is soft. There is no mass.     Tenderness: There is no abdominal tenderness. There is no guarding or rebound.           Assessment & Plan:  Rib pain on left side - Plan: DG Ribs Unilateral Left, DG Thoracic Spine W/Swimmers  Differential diagnosis includes rib fracture, intercostal muscle strain, intercostal nerve pain.  Patient is requesting something for pain.  I have recommended that we get an x-ray first to determine what were dealing with.  I will get a thoracic spine x-ray to rule out a vertebral fracture given the fact she has had neuropathic pain radiating around first to the right and now to the left.  I will obtain an x-ray of the rib to see if there is a rib fracture.  If the x-rays are normal, I would suspect an intercostal muscle strain.  Await the results of the x-ray.

## 2020-07-24 ENCOUNTER — Other Ambulatory Visit: Payer: Self-pay | Admitting: Family Medicine

## 2020-07-24 DIAGNOSIS — M5431 Sciatica, right side: Secondary | ICD-10-CM

## 2020-07-24 NOTE — Telephone Encounter (Signed)
Ok to refill 

## 2020-07-30 ENCOUNTER — Other Ambulatory Visit: Payer: Self-pay | Admitting: Family Medicine

## 2020-07-30 NOTE — Telephone Encounter (Signed)
Ok to refill??  Last office visit 07/22/2020.  Last refill 06/28/2020.  Ok to add refills to prescription?

## 2020-08-26 ENCOUNTER — Other Ambulatory Visit: Payer: Self-pay | Admitting: Family Medicine

## 2020-08-27 ENCOUNTER — Other Ambulatory Visit: Payer: Self-pay | Admitting: Family Medicine

## 2020-09-07 DIAGNOSIS — Z20822 Contact with and (suspected) exposure to covid-19: Secondary | ICD-10-CM | POA: Diagnosis not present

## 2020-09-23 ENCOUNTER — Other Ambulatory Visit: Payer: Self-pay | Admitting: Family Medicine

## 2020-09-23 DIAGNOSIS — M5431 Sciatica, right side: Secondary | ICD-10-CM

## 2020-09-23 NOTE — Telephone Encounter (Signed)
Ok to refill 

## 2020-09-25 ENCOUNTER — Other Ambulatory Visit: Payer: Self-pay | Admitting: Family Medicine

## 2020-09-26 NOTE — Telephone Encounter (Signed)
Please Advise

## 2020-10-28 ENCOUNTER — Other Ambulatory Visit: Payer: Self-pay | Admitting: Family Medicine

## 2020-10-28 NOTE — Telephone Encounter (Signed)
Ok to refill??  Last office visit 07/22/2020.  Last refill 09/26/2020.  Ok to add refills to prescription?

## 2020-11-22 ENCOUNTER — Other Ambulatory Visit: Payer: Self-pay | Admitting: Family Medicine

## 2020-11-22 DIAGNOSIS — M5431 Sciatica, right side: Secondary | ICD-10-CM

## 2020-11-25 NOTE — Telephone Encounter (Signed)
Ok to refill 

## 2020-11-29 ENCOUNTER — Other Ambulatory Visit: Payer: Self-pay | Admitting: Family Medicine

## 2020-11-29 NOTE — Telephone Encounter (Signed)
Ok to refill??  Last office visit 07/22/2020  Last refill 07/30/2020, #3 refills.

## 2020-12-20 ENCOUNTER — Ambulatory Visit (INDEPENDENT_AMBULATORY_CARE_PROVIDER_SITE_OTHER): Payer: Medicare PPO | Admitting: Family Medicine

## 2020-12-20 ENCOUNTER — Other Ambulatory Visit: Payer: Self-pay

## 2020-12-20 VITALS — BP 126/68 | HR 83 | Temp 96.6°F | Resp 16 | Ht <= 58 in | Wt 135.0 lb

## 2020-12-20 DIAGNOSIS — R8290 Unspecified abnormal findings in urine: Secondary | ICD-10-CM

## 2020-12-20 DIAGNOSIS — R7303 Prediabetes: Secondary | ICD-10-CM

## 2020-12-20 DIAGNOSIS — K439 Ventral hernia without obstruction or gangrene: Secondary | ICD-10-CM

## 2020-12-20 LAB — URINALYSIS, ROUTINE W REFLEX MICROSCOPIC
Bilirubin Urine: NEGATIVE
Glucose, UA: NEGATIVE
Hyaline Cast: NONE SEEN /LPF
Ketones, ur: NEGATIVE
Nitrite: POSITIVE — AB
Specific Gravity, Urine: 1.025 (ref 1.001–1.03)
pH: 5.5 (ref 5.0–8.0)

## 2020-12-20 LAB — MICROSCOPIC MESSAGE

## 2020-12-20 MED ORDER — CIPROFLOXACIN HCL 500 MG PO TABS
500.0000 mg | ORAL_TABLET | Freq: Two times a day (BID) | ORAL | 0 refills | Status: DC
Start: 2020-12-20 — End: 2021-04-01

## 2020-12-20 MED ORDER — MELOXICAM 15 MG PO TABS: 15.0000 mg | ORAL_TABLET | Freq: Every day | ORAL | 0 refills | Status: DC

## 2020-12-20 NOTE — Progress Notes (Signed)
Subjective:     Patient ID: Felicia [name], female   DOB: 08/16/1966, 55 y.o.   MRN: [id number]  HPI  Patient presents today complaining of dysuria.  She states that she has had symptoms for about 4 weeks.  She reports burning when she pees.  She reports frequency and urgency and hesitancy.  She has been trying to manage it naturally by drinking more fluids and cranberry juice.  However symptoms of worsened.  Today her urinalysis shows blood, nitrites, and leukocyte esterase all suggesting a urinary tract infection.  She denies any fevers or chills or nausea or vomiting.  She is overdue to recheck her A1c.  In March of last year, her A1c was borderline for diabetes at 6.4.  She has not had any lab work since that time although she has made dietary changes and try to lose weight.  Third issue is she has noticed a bulge in the superior portion of her abdomen just around the xiphoid process.  On examination today there is a soft mass between her breast just below the xiphoid process at the superior end of a ventral surgical scar.  With Valsalva and sit ups, I soft intestinal bulge comes through a palpable defect suggesting a hernia. Past Medical History:  Diagnosis Date  . Allergy    year around  . Anemia   . Anxiety   . Arthritis   . Asthma   . Atrial fibrillation (HCC)   . Bipolar 1 disorder (HCC)   . Borderline diabetes   . Depression   . Early cataract    right  . Ectopic pregnancy   . Fatty liver   . Headache   . Heart murmur   . Hyperlipidemia   . Insomnia   . Peripheral arterial disease (HCC)   . Pneumonia   . PUD (peptic ulcer disease)   . Right knee pain 12-05-2015   per pt.  she fell out of her husband's truck and landed on her right knee  . Shortness of breath dyspnea   . Wears glasses   . Wears partial dentures    Past Surgical History:  Procedure Laterality Date  . ABDOMINAL AORTAGRAM N/A 09/12/2012   Procedure: ABDOMINAL AORTAGRAM;  Surgeon: Fransisco Hertz, MD;   Location: [facility] CATH LAB;  Service: Cardiovascular;  Laterality: N/A;  . AORTA - BILATERAL FEMORAL ARTERY BYPASS GRAFT N/A 10/23/2014   Procedure: AORTOBIFEMORAL BYPASS GRAFT;  Surgeon: Chuck Hint, MD;  Location: [facility] OR;  Service: Vascular;  Laterality: N/A;  . BACK SURGERY    . CESAREAN SECTION    . COLONOSCOPY  01-14-2004   > 11 yr ago Dr Loreta Ave - normal - report in epic   . DILATATION & CURETTAGE/HYSTEROSCOPY WITH MYOSURE N/A 10/15/2015   Procedure: DILATATION & CURETTAGE/HYSTEROSCOPY WITH MYOSURE;  Surgeon: Ok Edwards, MD;  Location: [facility] ORS;  Service: Gynecology;  Laterality: N/A;  . ESOPHAGOGASTRODUODENOSCOPY  01-14-2004   gastritis- dr Loreta Ave- in epic already   . MULTIPLE TOOTH EXTRACTIONS    . NOSE SURGERY    . NOSE SURGERY    . SHOULDER ARTHROSCOPY WITH SUBACROMIAL DECOMPRESSION, ROTATOR CUFF REPAIR AND BICEP TENDON REPAIR Left 09/26/2019   Procedure: left shoulder arthroscopy, biceps release and tenodesis, mini open rotator cuff tear repair subscapularis and supraspinatus;  Surgeon: Cammy Copa, MD;  Location: [facility] OR;  Service: Orthopedics;  Laterality: Left;  . SHOULDER SURGERY     rotator cuff repair   . TONSILLECTOMY    .  TUBAL LIGATION    . UPPER GASTROINTESTINAL ENDOSCOPY  01-14-2004   in epic- dr Loreta Ave- gastritis    Current Outpatient Medications on File Prior to Visit  Medication Sig Dispense Refill  . clonazePAM (KLONOPIN) 1 MG tablet TAKE 1 TABLET BY MOUTH THREE TIMES A DAY 90 tablet 3  . cyclobenzaprine (FLEXERIL) 10 MG tablet TAKE 1 TABLET BY MOUTH THREE TIMES A DAY AS NEEDED FOR MUSCLE SPASM 90 tablet 1  . hydrochlorothiazide (HYDRODIURIL) 25 MG tablet TAKE 1 TABLET BY MOUTH EVERY DAY 90 tablet 3  . metoprolol succinate (TOPROL-XL) 50 MG 24 hr tablet TAKE 1 TABLET BY MOUTH EVERY DAY 90 tablet 1  . ondansetron (ZOFRAN) 4 MG tablet Take 1 tablet (4 mg total) by mouth every 8 (eight) hours as needed for nausea or vomiting. 20 tablet 0  . traMADol (ULTRAM) 50  MG tablet TAKE 2 TABLETS BY MOUTH EVERY 8 HOURS AS NEEDED 180 tablet 3  . albuterol (VENTOLIN HFA) 108 (90 Base) MCG/ACT inhaler INHALE 2 PUFFS INTO THE LUNGS EVERY 6 HOURS AS NEEDED FOR WHEEZE OR SHORTNESS OF BREATH 18 g 3  . aspirin EC 81 MG tablet Take 81 mg by mouth daily.    Marland Kitchen atorvastatin (LIPITOR) 80 MG tablet TAKE 1 TABLET BY MOUTH EVERY DAY 90 tablet 3  . dicyclomine (BENTYL) 20 MG tablet TAKE 1 TABLET (20 MG TOTAL) BY MOUTH 3 (THREE) TIMES DAILY WITH MEALS AS NEEDED FOR SPASMS. (Patient not taking: No sig reported) 20 tablet 0  . diphenhydrAMINE (BENADRYL) 25 MG tablet Take 50 mg by mouth every 6 (six) hours as needed for allergies. (Patient not taking: No sig reported)    . EPIPEN 2-PAK 0.3 MG/0.3ML SOAJ injection USE AS DIRECTED (Patient not taking: No sig reported) 2 Device 0  . fluticasone (FLONASE) 50 MCG/ACT nasal spray Place 1 spray into both nostrils daily for 14 days. 16 g 0  . naproxen sodium (ALEVE) 220 MG tablet Take 440 mg by mouth 2 (two) times daily as needed (pain). (Patient not taking: No sig reported)    . oxyCODONE-acetaminophen (PERCOCET) 5-325 MG tablet Take 1-2 tablets by mouth every 6 (six) hours as needed for severe pain (stop tramadol). (Patient not taking: No sig reported) 60 tablet 0   No current facility-administered medications on file prior to visit.   Allergies  Allergen Reactions  . Bee Pollen Swelling and Other (See Comments)    Also ticks   . Bactrim [Sulfamethoxazole-Trimethoprim] Hives  . Cephalosporins Hives  . Doxycycline Hives  . Tetracyclines & Related Hives  . Other Itching    Nuts causes itching and hives not sure what type  . Augmentin [Amoxicillin-Pot Clavulanate] Diarrhea  . Codeine Nausea Only  . Reflex Pain Relief [Menthol] Rash    Muscle Rub   Social History   Socioeconomic History  . Marital status: Married    Spouse name: Not on file  . Number of children: Not on file  . Years of education: Not on file  . Highest education  level: Not on file  Occupational History  . Not on file  Tobacco Use  . Smoking status: Current Every Day Smoker    Packs/day: 0.50    Years: 30.00    Pack years: 15.00    Types: Cigarettes  . Smokeless tobacco: Never Used  Vaping Use  . Vaping Use: Never used  Substance and Sexual Activity  . Alcohol use: Yes    Alcohol/week: 1.0 standard drink    Types:  1 Shots of liquor per week    Comment: very occassionally on social occassions  . Drug use: No  . Sexual activity: Yes  Other Topics Concern  . Not on file  Social History Narrative  . Not on file   Social Determinants of Health   Financial Resource Strain: Not on file  Food Insecurity: Not on file  Transportation Needs: Not on file  Physical Activity: Not on file  Stress: Not on file  Social Connections: Not on file  Intimate Partner Violence: Not on file    Review of Systems  All other systems reviewed and are negative.      Objective:   Physical Exam Vitals reviewed.  Constitutional:      Appearance: She is well-developed.  Cardiovascular:     Heart sounds: Normal heart sounds. No murmur heard. No friction rub. No gallop.   Pulmonary:     Effort: Pulmonary effort is normal. No respiratory distress.     Breath sounds: Normal breath sounds. No stridor. No wheezing, rhonchi or rales.  Abdominal:     General: A surgical scar is present. Bowel sounds are normal. There is no distension. There are no signs of injury.     Palpations: Abdomen is soft. There is no shifting dullness or mass.     Tenderness: There is no abdominal tenderness.     Hernia: A hernia is present. Hernia is present in the ventral area.  Neurological:     Mental Status: She is alert.        Assessment:    Ventral hernia without obstruction or gangrene - Plan: Ambulatory referral to General Surgery  Prediabetes - Plan: Hemoglobin A1c, COMPLETE METABOLIC PANEL WITH GFR, CBC with Differential/Platelet  Malodorous urine - Plan:  Urinalysis, Routine w reflex microscopic, Urine Culture      Plan:       Urine suggest a urinary tract infection.  Begin Cipro 500 mg p.o. twice daily for 5 days for urinary tract infection given her history of an allergy to cephalosporins and sulfur.  Recheck a hemoglobin A1c to monitor the management of the patient's prediabetes.  Consult general surgery regarding her ventral hernia.

## 2020-12-21 LAB — CBC WITH DIFFERENTIAL/PLATELET
Absolute Monocytes: 576 cells/uL (ref 200–950)
Basophils Absolute: 63 cells/uL (ref 0–200)
Basophils Relative: 0.7 %
Eosinophils Absolute: 153 cells/uL (ref 15–500)
Eosinophils Relative: 1.7 %
HCT: 38.3 % (ref 35.0–45.0)
Hemoglobin: 13 g/dL (ref 11.7–15.5)
Lymphs Abs: 3060 cells/uL (ref 850–3900)
MCH: 30.8 pg (ref 27.0–33.0)
MCHC: 33.9 g/dL (ref 32.0–36.0)
MCV: 90.8 fL (ref 80.0–100.0)
MPV: 11.1 fL (ref 7.5–12.5)
Monocytes Relative: 6.4 %
Neutro Abs: 5148 cells/uL (ref 1500–7800)
Neutrophils Relative %: 57.2 %
Platelets: 263 10*3/uL (ref 140–400)
RBC: 4.22 10*6/uL (ref 3.80–5.10)
RDW: 12.5 % (ref 11.0–15.0)
Total Lymphocyte: 34 %
WBC: 9 10*3/uL (ref 3.8–10.8)

## 2020-12-21 LAB — COMPLETE METABOLIC PANEL WITH GFR
AG Ratio: 1.6 (calc) (ref 1.0–2.5)
ALT: 20 U/L (ref 6–29)
AST: 21 U/L (ref 10–35)
Albumin: 3.7 g/dL (ref 3.6–5.1)
Alkaline phosphatase (APISO): 61 U/L (ref 37–153)
BUN: 20 mg/dL (ref 7–25)
CO2: 32 mmol/L (ref 20–32)
Calcium: 9 mg/dL (ref 8.6–10.4)
Chloride: 104 mmol/L (ref 98–110)
Creat: 1 mg/dL (ref 0.50–1.05)
GFR, Est African American: 73 mL/min/{1.73_m2} (ref >=60)
GFR, Est Non African American: 63 mL/min/{1.73_m2} (ref >=60)
Globulin: 2.3 g/dL (calc) (ref 1.9–3.7)
Glucose, Bld: 86 mg/dL (ref 65–99)
Potassium: 3.9 mmol/L (ref 3.5–5.3)
Sodium: 142 mmol/L (ref 135–146)
Total Bilirubin: 0.3 mg/dL (ref 0.2–1.2)
Total Protein: 6 g/dL — ABNORMAL LOW (ref 6.1–8.1)

## 2020-12-21 LAB — HEMOGLOBIN A1C
Hgb A1c MFr Bld: 5.7 % of total Hgb — ABNORMAL HIGH (ref <5.7)
Mean Plasma Glucose: 117 mg/dL
eAG (mmol/L): 6.5 mmol/L

## 2020-12-22 LAB — URINE CULTURE
MICRO NUMBER:: 11470218
SPECIMEN QUALITY:: ADEQUATE

## 2020-12-26 ENCOUNTER — Other Ambulatory Visit: Payer: Self-pay | Admitting: Family Medicine

## 2020-12-29 ENCOUNTER — Other Ambulatory Visit: Payer: Self-pay | Admitting: Family Medicine

## 2020-12-29 DIAGNOSIS — M5431 Sciatica, right side: Secondary | ICD-10-CM

## 2020-12-30 NOTE — Telephone Encounter (Signed)
Ok to refill 

## 2021-01-02 ENCOUNTER — Ambulatory Visit: Payer: Medicare PPO | Admitting: General Surgery

## 2021-01-06 ENCOUNTER — Other Ambulatory Visit: Payer: Self-pay | Admitting: Family Medicine

## 2021-01-16 ENCOUNTER — Ambulatory Visit: Payer: Medicare PPO | Admitting: General Surgery

## 2021-01-23 ENCOUNTER — Ambulatory Visit: Payer: Medicare PPO | Admitting: General Surgery

## 2021-01-23 ENCOUNTER — Encounter: Payer: Self-pay | Admitting: General Surgery

## 2021-01-23 ENCOUNTER — Other Ambulatory Visit: Payer: Self-pay | Admitting: Family Medicine

## 2021-01-23 ENCOUNTER — Other Ambulatory Visit: Payer: Self-pay

## 2021-01-23 VITALS — BP 137/72 | HR 95 | Temp 97.1°F | Resp 16 | Ht <= 58 in | Wt 126.0 lb

## 2021-01-23 DIAGNOSIS — K432 Incisional hernia without obstruction or gangrene: Secondary | ICD-10-CM | POA: Insufficient documentation

## 2021-01-23 NOTE — Patient Instructions (Addendum)
Will refer you to cardiology to risk stratify you for surgery.   Ventral Hernia  A ventral hernia is a bulge of tissue from inside the abdomen that pushes through a weak area of the muscles that form the front wall of the abdomen. The tissues inside the abdomen are inside a sac (peritoneum). These tissues include the small intestine, large intestine, and the fatty tissue that covers the intestines (omentum). Sometimes, the bulge that forms a hernia contains intestines. Other hernias contain only fat. Ventral hernias do not go away without surgical treatment. There are several types of ventral hernias. You may have:  A hernia at an incision site from previous abdominal surgery (incisional hernia).  A hernia just above the belly button (epigastric hernia), or at the belly button (umbilical hernia). These types of hernias can develop from heavy lifting or straining.  A hernia that comes and goes (reducible hernia). It may be visible only when you lift or strain. This type of hernia can be pushed back into the abdomen (reduced).  A hernia that traps abdominal tissue inside the hernia (incarcerated hernia). This type of hernia does not reduce.  A hernia that cuts off blood flow to the tissues inside the hernia (strangulated hernia). The tissues can start to die if this happens. This is a very painful bulge that cannot be reduced. A strangulated hernia is a medical emergency. What are the causes? This condition is caused by abdominal tissue putting pressure on an area of weakness in the abdominal muscles. What increases the risk? The following factors may make you more likely to develop this condition:  Being age 52 or older.  Being overweight or obese.  Having had previous abdominal surgery, especially if there was an infection after surgery.  Having had an injury to the abdominal wall.  Frequently lifting or pushing heavy objects.  Having had several pregnancies.  Having a buildup of  fluid inside the abdomen (ascites).  Straining to have a bowel movement or to urinate.  Having frequent coughing episodes. What are the signs or symptoms? The only symptom of a ventral hernia may be a painless bulge in the abdomen. A reducible hernia may be visible only when you strain, cough, or lift. Other symptoms may include:  Dull pain.  A feeling of pressure. Signs and symptoms of a strangulated hernia may include:  Increasing pain.  Nausea and vomiting.  Pain when pressing on the hernia.  The skin over the hernia turning red or purple.  Constipation.  Blood in the stool (feces). How is this diagnosed? This condition may be diagnosed based on:  Your symptoms.  Your medical history.  A physical exam. You may be asked to cough or strain while standing. These actions increase the pressure inside your abdomen and force the hernia through the opening in your muscles. Your health care provider may try to reduce the hernia by gently pushing the hernia back in.  Imaging studies, such as an ultrasound or CT scan. How is this treated? This condition is treated with surgery. If you have a strangulated hernia, surgery is done as soon as possible. If your hernia is small and not incarcerated, you may be asked to lose some weight before surgery. Follow these instructions at home:  Follow instructions from your health care provider about eating or drinking restrictions.  If you are overweight, your health care provider may recommend that you increase your activity level and eat a healthier diet.  Do not lift anything that is  heavier than 10 lb (4.5 kg), or the limit that you are told, until your health care provider says that it is safe.  Return to your normal activities as told by your health care provider. Ask your health care provider what activities are safe for you. You may need to avoid activities that increase pressure on your hernia.  Take over-the-counter and  prescription medicines only as told by your health care provider.  Keep all follow-up visits. This is important. Contact a health care provider if:  Your hernia gets larger.  Your hernia becomes painful. Get help right away if:  Your hernia becomes increasingly painful.  You have pain along with any of the following: ? Changes in skin color in the area of the hernia. ? Nausea. ? Vomiting. ? Fever. These symptoms may represent a serious problem that is an emergency. Do not wait to see if the symptoms will go away. Get medical help right away. Call your local emergency services (911 in the [place] Do not drive yourself to the hospital. Summary  A ventral hernia is a bulge of tissue from inside the abdomen that pushes through a weak area of the muscles that form the front wall of the abdomen.  This condition is treated with surgery, which may be urgent depending on your hernia.  Do not lift anything that is heavier than 10 lb (4.5 kg), and follow activity instructions from your health care provider. This information is not intended to replace advice given to you by your health care provider. Make sure you discuss any questions you have with your health care provider. Document Revised: 06/28/2020 Document Reviewed: 06/28/2020 Elsevier Patient Education  2021 [employer]   Open Hernia Repair, Adult Open hernia repair is a surgical procedure to fix a hernia. A hernia occurs when an internal organ or tissue pushes through a weak spot in the muscles along the wall of the abdomen. Hernias commonly occur in the groin and around the belly button. Most hernias tend to get worse over time. Often, surgery is done to prevent the hernia from becoming bigger, uncomfortable, or an emergency. Emergency surgery may be needed if contents of the abdomen get stuck in the opening (incarcerated hernia) or if the blood supply gets cut off (strangulated hernia). In an open repair, an incision is made in the  abdomen to perform the surgery. Tell a health care provider about:  Any allergies you have.  All medicines you are taking, including vitamins, herbs, eye drops, creams, and over-the-counter medicines.  Any problems you or family members have had with anesthetic medicines.  Any blood or bone disorders you have.  Any surgeries you have had.  Any medical conditions you have, including any recent cold or flu (influenza)symptoms.  Whether you are pregnant or may be pregnant. What are the risks? Generally, this is a safe procedure. However, problems may occur, including:  Long-lasting (chronic) pain.  Bleeding.  Infection.  Damage to the testicles. This can cause shrinking or swelling.  Damage to nearby structures or organs, including the bladder, blood vessels, intestines, or nerves near the hernia.  Blood clots.  Trouble passing urine.  Return of the hernia. Medicines Ask your health care provider about:  Changing or stopping your regular medicines. This is especially important if you are taking diabetes medicines or blood thinners.  Taking medicines such as aspirin and ibuprofen. These medicines can thin your blood. Do not take these medicines unless your health care provider tells you to take them.  Taking over-the-counter medicines, vitamins, herbs, and supplements. Surgery safety Ask your health care provider:  How your surgery site will be marked.  What steps will be taken to help prevent infection. These steps may include: ? Removing hair at the surgery site. ? Washing skin with a germ-killing soap. ? Receiving antibiotic medicine. General instructions  You may have an exam or testing, such as blood tests or imaging studies.  Do not use any products that contain nicotine or tobacco for at least 4 weeks before the procedure. These products include cigarettes, chewing tobacco, and vaping devices, such as e-cigarettes. If you need help quitting, ask your health  care provider.  Let your health care provider know if you develop a cold or any infection before your surgery. If you get an infection before surgery, you may receive antibiotics to treat it.  Plan to have a responsible adult take you home from the hospital or clinic.  If you will be going home right after the procedure, plan to have a responsible adult care for you for the time you are told. This is important. What happens during the procedure?  An IV will be inserted into one of your veins.  You will be given one or more of the following: ? A medicine to help you relax (sedative). ? A medicine to numb the area (local anesthetic). ? A medicine to make you fall asleep (general anesthetic).  Your surgeon will make an incision over the hernia.  The tissues of the hernia will be moved back into place.  The edges of the hernia may be stitched (sutured) together.  The opening in the abdominal muscles will be closed with stitches (sutures). Or, your surgeon will place a mesh patch made of artificial (synthetic) material over the opening.  The incision will be closed with sutures, skin glue, or adhesive strips.  A bandage (dressing) may be placed over the incision. The procedure may vary among health care providers and hospitals.   What happens after the procedure?  Your blood pressure, heart rate, breathing rate, and blood oxygen level will be monitored until you leave the hospital or clinic.  You may be given medicine for pain.  If you were given a sedative during the procedure, it can affect you for several hours. Do not drive or operate machinery until your health care provider says that it is safe. Summary  Open hernia repair is a surgical procedure to fix a hernia. Hernias commonly occur in the groin and around the belly button.  Emergency surgery may be needed if contents of the abdomen get stuck in the opening (incarcerated hernia) or if the blood supply gets cut off  (strangulated hernia).  In this procedure, an incision is made in the abdomen to perform the surgery.  After the procedure, you may be given medicine for pain. This information is not intended to replace advice given to you by your health care provider. Make sure you discuss any questions you have with your health care provider. Document Revised: 06/24/2020 Document Reviewed: 06/24/2020 Elsevier Patient Education  2021 [employer].

## 2021-01-23 NOTE — Progress Notes (Signed)
[facility] History and Physical  Reason for Referral: Supraumbilical and epigastric hernia  Referring Physician:  Dr. Tanya Nones   Chief Complaint    Hernia      Felicia [name] is a 55 y.o. female.  HPI: Felicia [name] is a 55 yo with A fib, PAD s/p aortobifemoral bypass for revascularization. She has noticed these bulges and they are getting larger. The one at the top is right under the xyphoid process and there is another above her umbilicus. She has a large incision from her aortobifemoral bypass procedure. She has some issues with nausea and has had PUD in the past. She reports that she gets some chest pain or pressure but that it never goes to her left arm so she did not think it was cardiac in nature. The pain is dull and comes on with activity.  She also has some SOB with activity.    She has some indigestion and reflux symptoms to.    Past Medical History:  Diagnosis Date  . Allergy    year around  . Anemia   . Anxiety   . Arthritis   . Asthma   . Atrial fibrillation (HCC)   . Bipolar 1 disorder (HCC)   . Borderline diabetes   . Depression   . Early cataract    right  . Ectopic pregnancy   . Fatty liver   . Headache   . Heart murmur   . Hyperlipidemia   . Insomnia   . Peripheral arterial disease (HCC)   . Pneumonia   . PUD (peptic ulcer disease)   . Right knee pain 12-05-2015   per pt.  she fell out of her husband's truck and landed on her right knee  . Shortness of breath dyspnea   . Wears glasses   . Wears partial dentures     Past Surgical History:  Procedure Laterality Date  . ABDOMINAL AORTAGRAM N/A 09/12/2012   Procedure: ABDOMINAL AORTAGRAM;  Surgeon: Fransisco Hertz, MD;  Location: [facility] CATH LAB;  Service: Cardiovascular;  Laterality: N/A;  . AORTA - BILATERAL FEMORAL ARTERY BYPASS GRAFT N/A 10/23/2014   Procedure: AORTOBIFEMORAL BYPASS GRAFT;  Surgeon: Chuck Hint, MD;  Location: [facility] OR;  Service: Vascular;  Laterality: N/A;  .  BACK SURGERY    . CESAREAN SECTION    . COLONOSCOPY  01-14-2004   > 11 yr ago Dr Loreta Ave - normal - report in epic   . DILATATION & CURETTAGE/HYSTEROSCOPY WITH MYOSURE N/A 10/15/2015   Procedure: DILATATION & CURETTAGE/HYSTEROSCOPY WITH MYOSURE;  Surgeon: Ok Edwards, MD;  Location: [facility] ORS;  Service: Gynecology;  Laterality: N/A;  . ESOPHAGOGASTRODUODENOSCOPY  01-14-2004   gastritis- dr Loreta Ave- in epic already   . MULTIPLE TOOTH EXTRACTIONS    . NOSE SURGERY    . NOSE SURGERY    . SHOULDER ARTHROSCOPY WITH SUBACROMIAL DECOMPRESSION, ROTATOR CUFF REPAIR AND BICEP TENDON REPAIR Left 09/26/2019   Procedure: left shoulder arthroscopy, biceps release and tenodesis, mini open rotator cuff tear repair subscapularis and supraspinatus;  Surgeon: Cammy Copa, MD;  Location: [facility] OR;  Service: Orthopedics;  Laterality: Left;  . SHOULDER SURGERY     rotator cuff repair   . TONSILLECTOMY    . TUBAL LIGATION    . UPPER GASTROINTESTINAL ENDOSCOPY  01-14-2004   in epic- dr Loreta Ave- gastritis     Family History  Adopted: Yes  Problem Relation Age of Onset  . Heart disease Mother   . Diabetes Mother   .  Hyperlipidemia Mother   . Hypertension Mother   . Heart attack Mother   . Heart disease Father   . Diabetes Father   . Heart attack Father   . Hypertension Father   . Bladder Cancer Father   . Diabetes Brother   . Lung cancer Paternal Grandfather   . Colon cancer Neg Hx   . Colon polyps Neg Hx   . Rectal cancer Neg Hx   . Stomach cancer Neg Hx     Social History   Tobacco Use  . Smoking status: Current Every Day Smoker    Packs/day: 0.50    Years: 30.00    Pack years: 15.00    Types: Cigarettes  . Smokeless tobacco: Never Used  Vaping Use  . Vaping Use: Never used  Substance Use Topics  . Alcohol use: Yes    Alcohol/week: 1.0 standard drink    Types: 1 Shots of liquor per week    Comment: very occassionally on social occassions  . Drug use: No    Medications: I have reviewed  the patient's current medications. Allergies as of 01/23/2021      Reactions   Bee Pollen Swelling, Other (See Comments)   Also ticks    Bactrim [sulfamethoxazole-trimethoprim] Hives   Cephalosporins Hives   Doxycycline Hives   Tetracyclines & Related Hives   Other Itching   Nuts causes itching and hives not sure what type   Augmentin [amoxicillin-pot Clavulanate] Diarrhea   Codeine Nausea Only   Reflex Pain Relief [menthol] Rash   Muscle Rub      Medication List       Accurate as of January 23, 2021  2:07 PM. If you have any questions, ask your nurse or doctor.        albuterol 108 (90 Base) MCG/ACT inhaler Commonly known as: VENTOLIN HFA INHALE 2 PUFFS INTO THE LUNGS EVERY 6 HOURS AS NEEDED FOR WHEEZE/SHORTNES OF BREATH   aspirin EC 81 MG tablet Take 81 mg by mouth daily.   atorvastatin 80 MG tablet Commonly known as: LIPITOR TAKE 1 TABLET BY MOUTH EVERY DAY What changed:   how much to take  how to take this  when to take this  additional instructions Changed by: Donita BrooksWarren T Pickard, MD   ciprofloxacin 500 MG tablet Commonly known as: Cipro Take 1 tablet (500 mg total) by mouth 2 (two) times daily.   clonazePAM 1 MG tablet Commonly known as: KLONOPIN TAKE 1 TABLET BY MOUTH THREE TIMES A DAY   cyclobenzaprine 10 MG tablet Commonly known as: FLEXERIL TAKE 1 TABLET BY MOUTH THREE TIMES A DAY AS NEEDED FOR MUSCLE SPASM   dicyclomine 20 MG tablet Commonly known as: BENTYL TAKE 1 TABLET (20 MG TOTAL) BY MOUTH 3 (THREE) TIMES DAILY WITH MEALS AS NEEDED FOR SPASMS.   diphenhydrAMINE 25 MG tablet Commonly known as: BENADRYL Take 50 mg by mouth every 6 (six) hours as needed for allergies.   EpiPen 2-Pak 0.3 mg/0.3 mL Soaj injection Generic drug: EPINEPHrine USE AS DIRECTED   fluticasone 50 MCG/ACT nasal spray Commonly known as: FLONASE Place 1 spray into both nostrils daily for 14 days.   hydrochlorothiazide 25 MG tablet Commonly known as: HYDRODIURIL TAKE 1  TABLET BY MOUTH EVERY DAY   meloxicam 15 MG tablet Commonly known as: MOBIC TAKE 1 TABLET (15 MG TOTAL) BY MOUTH DAILY.   metoprolol succinate 50 MG 24 hr tablet Commonly known as: TOPROL-XL TAKE 1 TABLET BY MOUTH EVERY DAY   naproxen  sodium 220 MG tablet Commonly known as: ALEVE Take 440 mg by mouth 2 (two) times daily as needed (pain).   ondansetron 4 MG tablet Commonly known as: Zofran Take 1 tablet (4 mg total) by mouth every 8 (eight) hours as needed for nausea or vomiting.   oxyCODONE-acetaminophen 5-325 MG tablet Commonly known as: Percocet Take 1-2 tablets by mouth every 6 (six) hours as needed for severe pain (stop tramadol).   traMADol 50 MG tablet Commonly known as: ULTRAM TAKE 2 TABLETS BY MOUTH EVERY 8 HOURS AS NEEDED        ROS:  A comprehensive review of systems was negative except for: Respiratory: positive for wheezing and SOB Cardiovascular: positive for chest pressure/discomfort Gastrointestinal: positive for abdominal pain, nausea and reflux symptoms Musculoskeletal: positive for back pain and joint pain  Blood pressure 137/72, pulse 95, temperature (!) 97.1 F (36.2 C), temperature source Other (Comment), resp. rate 16, height 4\' 9"  (1.448 m), weight 126 lb (57.2 kg), last menstrual period 12/26/2012, SpO2 95 %. Physical Exam Vitals reviewed.  HENT:     Head: Normocephalic.     Nose: Nose normal.  Eyes:     Extraocular Movements: Extraocular movements intact.  Cardiovascular:     Rate and Rhythm: Normal rate.  Pulmonary:     Effort: Pulmonary effort is normal.  Abdominal:     General: There is no distension.     Palpations: Abdomen is soft.     Tenderness: There is no abdominal tenderness.     Hernia: A hernia is present. Hernia is present in the ventral area.     Comments: Defect under xyphoid process about 2cm, supraumbilical defect about 1.5 cm, reduced   Musculoskeletal:        General: No swelling.     Cervical back: Normal range of  motion.  Skin:    General: Skin is warm.  Neurological:     General: No focal deficit present.     Mental Status: She is alert and oriented to person, place, and time.  Psychiatric:        Mood and Affect: Mood normal.        Behavior: Behavior normal.        Thought Content: Thought content normal.        Judgment: Judgment normal.     Results: Reviewed CT from 05/2018 and see two areas of corresponding to the hernias an area in the supraumbilical area and one just below the xyphoid, both have fat only and are between 1-2 cm  Assessment & Plan:  JANALYNN EDER is a 55 y.o. female with two hernias in her midline likely related to her incision from her aortobifemoral bypass. She wants to get these repaired as they are causing her some discomfort. She does have some chest pain and SOB complaints. She has seen Dr. 59 in the past but this was several years ago. She is high risk for cardiac disease given her PVD and prior bypass.   Discussed open incisional hernia repair and risk of bleeding, infection, injury to bowel, need for mesh and risk of recurrence. Discussed smoking, diabetes, and obesity being the most likely risk factors for recurrence. Discussed that her chest pain and SOB needs to be further evaluated given her vascular disease and risk of having coronary disease.   Referral to Cardiology for risk stratification.  Will get her scheduled to see me back or for surgery pending when she is seen by cardiology.   All questions  were answered to the satisfaction of the patient.   Lucretia Roers 01/23/2021, 2:07 PM

## 2021-02-04 ENCOUNTER — Telehealth: Payer: Self-pay

## 2021-02-24 ENCOUNTER — Other Ambulatory Visit: Payer: Self-pay | Admitting: Family Medicine

## 2021-03-20 ENCOUNTER — Other Ambulatory Visit: Payer: Self-pay | Admitting: *Deleted

## 2021-03-20 DIAGNOSIS — M5431 Sciatica, right side: Secondary | ICD-10-CM

## 2021-03-20 MED ORDER — CYCLOBENZAPRINE HCL 10 MG PO TABS: 10.0000 mg | ORAL_TABLET | Freq: Three times a day (TID) | ORAL | 1 refills | Status: DC | PRN

## 2021-03-20 NOTE — Telephone Encounter (Signed)
Received call from patient.   Requested refill on Flexeril.   Ok to refill?? 

## 2021-03-21 ENCOUNTER — Other Ambulatory Visit: Payer: Self-pay | Admitting: *Deleted

## 2021-03-21 MED ORDER — MELOXICAM 15 MG PO TABS
15.0000 mg | ORAL_TABLET | Freq: Every day | ORAL | 3 refills | Status: DC
Start: 2021-03-21 — End: 2021-09-08

## 2021-04-01 ENCOUNTER — Ambulatory Visit: Payer: Medicare PPO | Admitting: Cardiology

## 2021-04-01 ENCOUNTER — Telehealth: Payer: Self-pay | Admitting: Cardiology

## 2021-04-01 ENCOUNTER — Encounter: Payer: Self-pay | Admitting: Cardiology

## 2021-04-01 ENCOUNTER — Encounter: Payer: Self-pay | Admitting: *Deleted

## 2021-04-01 VITALS — BP 156/70 | HR 92 | Ht <= 58 in | Wt 133.0 lb

## 2021-04-01 DIAGNOSIS — R0600 Dyspnea, unspecified: Secondary | ICD-10-CM

## 2021-04-01 DIAGNOSIS — I350 Nonrheumatic aortic (valve) stenosis: Secondary | ICD-10-CM | POA: Diagnosis not present

## 2021-04-01 DIAGNOSIS — R0609 Other forms of dyspnea: Secondary | ICD-10-CM

## 2021-04-01 NOTE — Patient Instructions (Signed)
Medication Instructions:  Your physician recommends that you continue on your current medications as directed. Please refer to the Current Medication list given to you today.  Labwork: none  Testing/Procedures: Your physician has requested that you have an echocardiogram. Echocardiography is a painless test that uses sound waves to create images of your heart. It provides your doctor with information about the size and shape of your heart and how well your heart's chambers and valves are working. This procedure takes approximately one hour. There are no restrictions for this procedure. Your physician has requested that you have a lexiscan myoview. For further information please visit [email or web address]. Please follow instruction sheet, as given.  Follow-Up: Your physician recommends that you schedule a follow-up appointment in: 6 months  Any Other Special Instructions Will Be Listed Below (If Applicable).  If you need a refill on your cardiac medications before your next appointment, please call your pharmacy. 

## 2021-04-01 NOTE — Telephone Encounter (Signed)
Checking percert on the following patient for testing scheduled at [facility].   LEXISCAN   04/15/2021

## 2021-04-01 NOTE — Progress Notes (Addendum)
Clinical Summary Felicia [name] is a 55 y.o.female seen today as a new consult, referred by Dr Henreitta Leber for SOB and preoperative evaluation  1. SOB  01/2019 echo: LVEF 60-65%, grade I dd, normal RV, mild aortic stenosis  - recent SOB. Progressed over the last few months. - DOE walking up the stairs when carrying things. If not carrying anything no symptoms - denies any chest pain -rare infrequent swelling.    2. Mild aortic stenosis - 01/2019 echo mean grad 11, AVA VTI 2.6  2. PAD Prior aortobifemoral bypass grafting.   3. Preoperative evaluation - considering hernia repair  4. Asthma - chronic coughing, wheezing.   5. HTN - compliant with meds - recent other provider visits 120s-130s/60s-70s   6. Carotid stenosis - mild bilateral disease by 2020 [place]   Past Medical History:  Diagnosis Date   Allergy    year around   Anemia    Anxiety    Arthritis    Asthma    Atrial fibrillation (HCC)    Bipolar 1 disorder (HCC)    Borderline diabetes    Depression    Early cataract    right   Ectopic pregnancy    Fatty liver    Headache    Heart murmur    Hyperlipidemia    Insomnia    Peripheral arterial disease (HCC)    Pneumonia    PUD (peptic ulcer disease)    Right knee pain 12-05-2015   per pt.  she fell out of her husband's truck and landed on her right knee   Shortness of breath dyspnea    Wears glasses    Wears partial dentures      Allergies  Allergen Reactions   Bee Pollen Swelling and Other (See Comments)    Also ticks    Bactrim [Sulfamethoxazole-Trimethoprim] Hives   Cephalosporins Hives   Doxycycline Hives   Tetracyclines & Related Hives   Other Itching    Nuts causes itching and hives not sure what type   Augmentin [Amoxicillin-Pot Clavulanate] Diarrhea   Codeine Nausea Only   Reflex Pain Relief [Menthol] Rash    Muscle Rub     Current Outpatient Medications  Medication Sig Dispense Refill   albuterol (VENTOLIN HFA) 108 (90 Base)  MCG/ACT inhaler INHALE 2 PUFFS INTO THE LUNGS EVERY 6 HOURS AS NEEDED FOR WHEEZE/SHORTNES OF BREATH 18 each 3   aspirin EC 81 MG tablet Take 81 mg by mouth daily.     atorvastatin (LIPITOR) 80 MG tablet TAKE 1 TABLET BY MOUTH EVERY DAY 90 tablet 3   ciprofloxacin (CIPRO) 500 MG tablet Take 1 tablet (500 mg total) by mouth 2 (two) times daily. 10 tablet 0   clonazePAM (KLONOPIN) 1 MG tablet TAKE 1 TABLET BY MOUTH THREE TIMES A DAY 90 tablet 3   cyclobenzaprine (FLEXERIL) 10 MG tablet Take 1 tablet (10 mg total) by mouth 3 (three) times daily as needed for muscle spasms. 90 tablet 1   dicyclomine (BENTYL) 20 MG tablet TAKE 1 TABLET (20 MG TOTAL) BY MOUTH 3 (THREE) TIMES DAILY WITH MEALS AS NEEDED FOR SPASMS. 20 tablet 0   diphenhydrAMINE (BENADRYL) 25 MG tablet Take 50 mg by mouth every 6 (six) hours as needed for allergies.     EPIPEN 2-PAK 0.3 MG/0.3ML SOAJ injection USE AS DIRECTED 2 Device 0   fluticasone (FLONASE) 50 MCG/ACT nasal spray Place 1 spray into both nostrils daily for 14 days. 16 g 0   hydrochlorothiazide (HYDRODIURIL)  25 MG tablet TAKE 1 TABLET BY MOUTH EVERY DAY 90 tablet 3   meloxicam (MOBIC) 15 MG tablet Take 1 tablet (15 mg total) by mouth daily. 30 tablet 3   metoprolol succinate (TOPROL-XL) 50 MG 24 hr tablet TAKE 1 TABLET BY MOUTH EVERY DAY 90 tablet 1   naproxen sodium (ALEVE) 220 MG tablet Take 440 mg by mouth 2 (two) times daily as needed (pain).     ondansetron (ZOFRAN) 4 MG tablet Take 1 tablet (4 mg total) by mouth every 8 (eight) hours as needed for nausea or vomiting. 20 tablet 0   oxyCODONE-acetaminophen (PERCOCET) 5-325 MG tablet Take 1-2 tablets by mouth every 6 (six) hours as needed for severe pain (stop tramadol). 60 tablet 0   traMADol (ULTRAM) 50 MG tablet TAKE 2 TABLETS BY MOUTH EVERY 8 HOURS AS NEEDED 180 tablet 3   No current facility-administered medications for this visit.     Past Surgical History:  Procedure Laterality Date   ABDOMINAL AORTAGRAM N/A  09/12/2012   Procedure: ABDOMINAL AORTAGRAM;  Surgeon: Fransisco Hertz, MD;  Location: [facility] CATH LAB;  Service: Cardiovascular;  Laterality: N/A;   AORTA - BILATERAL FEMORAL ARTERY BYPASS GRAFT N/A 10/23/2014   Procedure: AORTOBIFEMORAL BYPASS GRAFT;  Surgeon: Chuck Hint, MD;  Location: [facility] OR;  Service: Vascular;  Laterality: N/A;   BACK SURGERY     CESAREAN SECTION     COLONOSCOPY  01-14-2004   > 11 yr ago Dr Loreta Ave - normal - report in epic    DILATATION & CURETTAGE/HYSTEROSCOPY WITH MYOSURE N/A 10/15/2015   Procedure: DILATATION & CURETTAGE/HYSTEROSCOPY WITH MYOSURE;  Surgeon: Ok Edwards, MD;  Location: [facility] ORS;  Service: Gynecology;  Laterality: N/A;   ESOPHAGOGASTRODUODENOSCOPY  01-14-2004   gastritis- dr Loreta Ave- in epic already    MULTIPLE TOOTH EXTRACTIONS     NOSE SURGERY     NOSE SURGERY     SHOULDER ARTHROSCOPY WITH SUBACROMIAL DECOMPRESSION, ROTATOR CUFF REPAIR AND BICEP TENDON REPAIR Left 09/26/2019   Procedure: left shoulder arthroscopy, biceps release and tenodesis, mini open rotator cuff tear repair subscapularis and supraspinatus;  Surgeon: Cammy Copa, MD;  Location: [facility] OR;  Service: Orthopedics;  Laterality: Left;   SHOULDER SURGERY     rotator cuff repair    TONSILLECTOMY     TUBAL LIGATION     UPPER GASTROINTESTINAL ENDOSCOPY  01-14-2004   in epic- dr Loreta Ave- gastritis      Allergies  Allergen Reactions   Bee Pollen Swelling and Other (See Comments)    Also ticks    Bactrim [Sulfamethoxazole-Trimethoprim] Hives   Cephalosporins Hives   Doxycycline Hives   Tetracyclines & Related Hives   Other Itching    Nuts causes itching and hives not sure what type   Augmentin [Amoxicillin-Pot Clavulanate] Diarrhea   Codeine Nausea Only   Reflex Pain Relief [Menthol] Rash    Muscle Rub      Family History  Adopted: Yes  Problem Relation Age of Onset   Heart disease Mother    Diabetes Mother    Hyperlipidemia Mother    Hypertension Mother    Heart  attack Mother    Heart disease Father    Diabetes Father    Heart attack Father    Hypertension Father    Bladder Cancer Father    Diabetes Brother    Lung cancer Paternal Grandfather    Colon cancer Neg Hx    Colon polyps Neg Hx    Rectal cancer  Neg Hx    Stomach cancer Neg Hx      Social History Felicia [name] reports that she has been smoking cigarettes. She has a 15.00 pack-year smoking history. She has never used smokeless tobacco. Felicia [name] reports current alcohol use of about 1.0 standard drink of alcohol per week.   Review of Systems CONSTITUTIONAL: No weight loss, fever, chills, weakness or fatigue.  HEENT: Eyes: No visual loss, blurred vision, double vision or yellow sclerae.No hearing loss, sneezing, congestion, runny nose or sore throat.  SKIN: No rash or itching.  CARDIOVASCULAR: per hpi RESPIRATORY: per hpi GASTROINTESTINAL: No anorexia, nausea, vomiting or diarrhea. No abdominal pain or blood.  GENITOURINARY: No burning on urination, no polyuria NEUROLOGICAL: No headache, dizziness, syncope, paralysis, ataxia, numbness or tingling in the extremities. No change in bowel or bladder control.  MUSCULOSKELETAL: No muscle, back pain, joint pain or stiffness.  LYMPHATICS: No enlarged nodes. No history of splenectomy.  PSYCHIATRIC: No history of depression or anxiety.  ENDOCRINOLOGIC: No reports of sweating, cold or heat intolerance. No polyuria or polydipsia.  Marland Kitchen   Physical Examination Today's Vitals   04/01/21 1316  BP: (!) 156/70  Pulse: 92  SpO2: 98%  Weight: 133 lb (60.3 kg)  Height: 4\' 9"  (1.448 m)   Body mass index is 28.78 kg/m.  Gen: resting comfortably, no acute distress HEENT: no scleral icterus, pupils equal round and reactive, no palptable cervical adenopathy,  CV: RRR, 3/6 systolic murmur rusb, no jvd Resp: Clear to auscultation bilaterally GI: abdomen is soft, non-tender, non-distended, normal bowel sounds, no hepatosplenomegaly MSK:  extremities are warm, no edema.  Skin: warm, no rash Neuro:  no focal deficits Psych: appropriate affect   Diagnostic Studies 01/2019 echo IMPRESSIONS     1. The left ventricle has normal systolic function with an ejection  fraction of 60-65%. The cavity size was normal. Left ventricular diastolic  Doppler parameters are consistent with impaired relaxation.   2. The right ventricle has normal systolic function. The cavity was  normal.   3. There is moderate mitral annular calcification present.   4. The aortic valve is tricuspid Aortic valve regurgitation is mild by  color flow Doppler. mild stenosis of the aortic valve.   5. Normal LV systolic function; mild diastolic dysfunction; mild AS (mean  gradient 12 mmHg); mild AI.     Assessment and Plan  1. SOB - unclear etiology, primarily with higher levels of exertion - given prior history of aortic stenosis will repeat echo to see if any progression - given history of PAD and exertioanl DOE plan for lexiscan  - EKG today shows NSR  2. HTN - elevated today but last few clinic visits with other providers has been at goal, continue to monitor  3. Preoperative valuation - cardiac testing as reported above, would await results before defining overall risk     Addendum: benign echo and stress test, recommend proceeding with surgery as planned from cardiac standpoint.   F/u 6 months.      02/2019, M.D.

## 2021-04-04 ENCOUNTER — Other Ambulatory Visit: Payer: Self-pay | Admitting: Family Medicine

## 2021-04-10 ENCOUNTER — Encounter (HOSPITAL_BASED_OUTPATIENT_CLINIC_OR_DEPARTMENT_OTHER)
Admission: RE | Admit: 2021-04-10 | Discharge: 2021-04-10 | Disposition: A | Discharge status: Home / Self Care | Payer: Medicare PPO | Source: Ambulatory Visit | Attending: Cardiology | Admitting: Cardiology

## 2021-04-10 ENCOUNTER — Other Ambulatory Visit: Payer: Self-pay

## 2021-04-10 ENCOUNTER — Encounter (HOSPITAL_COMMUNITY)
Admission: RE | Admit: 2021-04-10 | Discharge: 2021-04-10 | Disposition: A | Discharge status: Home / Self Care | Payer: Medicare PPO | Source: Ambulatory Visit | Attending: Cardiology | Admitting: Cardiology

## 2021-04-10 DIAGNOSIS — R0600 Dyspnea, unspecified: Secondary | ICD-10-CM | POA: Diagnosis not present

## 2021-04-10 DIAGNOSIS — R0609 Other forms of dyspnea: Secondary | ICD-10-CM

## 2021-04-10 MED ORDER — REGADENOSON 0.4 MG/5ML IV SOLN
INTRAVENOUS | Status: AC
  Administered 2021-04-10: 0.4 mg via INTRAVENOUS
  Filled 2021-04-10: qty 5

## 2021-04-10 MED ORDER — TECHNETIUM TC 99M TETROFOSMIN IV KIT
30.0000 | PACK | Freq: Once | INTRAVENOUS | Status: AC | PRN
  Administered 2021-04-10: 31 via INTRAVENOUS

## 2021-04-10 MED ORDER — TECHNETIUM TC 99M TETROFOSMIN IV KIT
10.0000 | PACK | Freq: Once | INTRAVENOUS | Status: AC | PRN
  Administered 2021-04-10: 11 via INTRAVENOUS

## 2021-04-10 MED ORDER — SODIUM CHLORIDE FLUSH 0.9 % IV SOLN
INTRAVENOUS | Status: AC
  Administered 2021-04-10: 10 mL via INTRAVENOUS
  Filled 2021-04-10: qty 10

## 2021-04-10 MED ORDER — AMINOPHYLLINE 25 MG/ML IV SOLN
INTRAVENOUS | Status: AC
  Filled 2021-04-10: qty 10

## 2021-04-11 LAB — NM MYOCAR MULTI W/SPECT W/WALL MOTION / EF
LV dias vol: 58 mL (ref 46–106)
LV sys vol: 13 mL
Peak HR: 86 {beats}/min
RATE: 0.52
Rest HR: 68 {beats}/min
SDS: 0
SRS: 2
SSS: 2
TID: 1.32

## 2021-04-15 ENCOUNTER — Other Ambulatory Visit (HOSPITAL_COMMUNITY): Payer: Medicare PPO

## 2021-04-15 ENCOUNTER — Encounter (HOSPITAL_COMMUNITY): Payer: Medicare PPO

## 2021-04-22 ENCOUNTER — Telehealth: Payer: Self-pay | Admitting: *Deleted

## 2021-04-22 NOTE — Telephone Encounter (Signed)
Pt aware - echo scheduled for 6/16

## 2021-04-22 NOTE — Telephone Encounter (Signed)
-----   Message from Antoine Poche, MD sent at 04/22/2021  3:24 PM EDT ----- Stress test looks fine, has she scheduled her echo?  Dominga Ferry MD

## 2021-04-23 ENCOUNTER — Other Ambulatory Visit: Payer: Self-pay | Admitting: Family Medicine

## 2021-04-23 DIAGNOSIS — M5431 Sciatica, right side: Secondary | ICD-10-CM

## 2021-05-01 MED ORDER — METOPROLOL SUCCINATE ER 50 MG PO TB24
50.0000 mg | ORAL_TABLET | Freq: Every day | ORAL | 1 refills | Status: DC
Start: 2021-05-01 — End: 2021-12-17

## 2021-05-01 NOTE — Telephone Encounter (Signed)
Received fax requesting refill on Metoprolol.   Prescription sent to pharmacy.

## 2021-05-08 ENCOUNTER — Other Ambulatory Visit: Payer: Self-pay | Admitting: Family Medicine

## 2021-05-08 ENCOUNTER — Ambulatory Visit (INDEPENDENT_AMBULATORY_CARE_PROVIDER_SITE_OTHER): Payer: Medicare PPO

## 2021-05-08 DIAGNOSIS — I350 Nonrheumatic aortic (valve) stenosis: Secondary | ICD-10-CM

## 2021-05-08 LAB — ECHOCARDIOGRAM COMPLETE
AR max vel: 1.47 cm2
AV Area VTI: 1.8 cm2
AV Area mean vel: 1.47 cm2
AV Mean grad: 7.1 mmHg
AV Peak grad: 13.4 mmHg
Ao pk vel: 1.83 m/s
Area-P 1/2: 3.93 cm2
Calc EF: 78.3 %
P 1/2 time: 560 msec
S' Lateral: 2.3 cm
Single Plane A2C EF: 61 %
Single Plane A4C EF: 89.5 %

## 2021-05-15 ENCOUNTER — Telehealth: Payer: Self-pay | Admitting: Cardiology

## 2021-05-15 NOTE — Telephone Encounter (Signed)
-----   Message from Antoine Poche, MD sent at 05/13/2021 11:20 AM EDT ----- Echo shows normal heart function, her heart pump is actually very strong. Any reason she would be dehdyrated (vomiting, diarrhea, poor oral hydration?) can we ask how much fluid and what she drinks on a daily basis, sometimes, sometimes high pumping function of the heart can be due to dehydration   Dina Rich MD

## 2021-05-15 NOTE — Telephone Encounter (Signed)
Patient returning call to [facility] for results

## 2021-05-15 NOTE — Telephone Encounter (Signed)
Pt notified and stated that for the past three (3) weeks she has been having issues with diarrhea and leg cramping. Pt also states that she drinks 2-3 32 oz. Yeti cups (64-96 oz) of water daily and only drinking sodas at lunch/dinner.

## 2021-05-16 NOTE — Telephone Encounter (Signed)
Pt notified, verbalized understanding.

## 2021-05-16 NOTE — Telephone Encounter (Signed)
Heart testing looks fine, nothing to prevent her from her surgery. She should arrange f/u with her surgeon    Dominga Ferry MD

## 2021-06-25 ENCOUNTER — Other Ambulatory Visit: Payer: Self-pay | Admitting: Family Medicine

## 2021-06-25 DIAGNOSIS — M5431 Sciatica, right side: Secondary | ICD-10-CM

## 2021-06-25 NOTE — Telephone Encounter (Signed)
Ok to refill??  Last office visit 12/20/2020.  Last refill 02/24/2021, #3 refills.

## 2021-07-08 ENCOUNTER — Encounter: Payer: Self-pay | Admitting: General Surgery

## 2021-07-08 ENCOUNTER — Ambulatory Visit: Payer: Medicare PPO | Admitting: General Surgery

## 2021-07-08 ENCOUNTER — Other Ambulatory Visit: Payer: Self-pay

## 2021-07-08 VITALS — BP 143/75 | HR 96 | Temp 98.6°F | Resp 16 | Ht <= 58 in | Wt 130.0 lb

## 2021-07-08 DIAGNOSIS — K432 Incisional hernia without obstruction or gangrene: Secondary | ICD-10-CM

## 2021-07-08 NOTE — Progress Notes (Signed)
[facility] History and Physical   Chief Complaint   Follow-up     Felicia [name] is a 55 y.o. female.  HPI: Felicia [name] is a 55 yo who has two areas of incisional hernia after her aortobifemoral bypass, she also has A fib in the past but is not on blood thinners and had normal sinus rhythm on her last EKG.   She was evaluated by Cardiology and deemed acceptable risk of surgery.  She is having worsening pain and says that the hernias are getting larger. She had to recently lift her father off the ground after he fell and thinks that this has made things worse. She is ready to get it repaired as soon as possible.   Past Medical History:  Diagnosis Date   Allergy    year around   Anemia    Anxiety    Arthritis    Asthma    Atrial fibrillation (HCC)    Bipolar 1 disorder (HCC)    Borderline diabetes    Depression    Early cataract    right   Ectopic pregnancy    Fatty liver    Headache    Heart murmur    Hyperlipidemia    Insomnia    Peripheral arterial disease (HCC)    Pneumonia    PUD (peptic ulcer disease)    Right knee pain 12-05-2015   per pt.  she fell out of her husband's truck and landed on her right knee   Shortness of breath dyspnea    Wears glasses    Wears partial dentures     Past Surgical History:  Procedure Laterality Date   ABDOMINAL AORTAGRAM N/A 09/12/2012   Procedure: ABDOMINAL Ronny Flurry;  Surgeon: Fransisco Hertz, MD;  Location: [facility] CATH LAB;  Service: Cardiovascular;  Laterality: N/A;   AORTA - BILATERAL FEMORAL ARTERY BYPASS GRAFT N/A 10/23/2014   Procedure: AORTOBIFEMORAL BYPASS GRAFT;  Surgeon: Chuck Hint, MD;  Location: [facility] OR;  Service: Vascular;  Laterality: N/A;   BACK SURGERY     CESAREAN SECTION     COLONOSCOPY  01-14-2004   > 11 yr ago Dr Loreta Ave - normal - report in epic    DILATATION & CURETTAGE/HYSTEROSCOPY WITH MYOSURE N/A 10/15/2015   Procedure: DILATATION & CURETTAGE/HYSTEROSCOPY WITH MYOSURE;  Surgeon: Ok Edwards, MD;  Location: [facility] ORS;  Service: Gynecology;  Laterality: N/A;   ESOPHAGOGASTRODUODENOSCOPY  01-14-2004   gastritis- dr Loreta Ave- in epic already    MULTIPLE TOOTH EXTRACTIONS     NOSE SURGERY     NOSE SURGERY     SHOULDER ARTHROSCOPY WITH SUBACROMIAL DECOMPRESSION, ROTATOR CUFF REPAIR AND BICEP TENDON REPAIR Left 09/26/2019   Procedure: left shoulder arthroscopy, biceps release and tenodesis, mini open rotator cuff tear repair subscapularis and supraspinatus;  Surgeon: Cammy Copa, MD;  Location: [facility] OR;  Service: Orthopedics;  Laterality: Left;   SHOULDER SURGERY     rotator cuff repair    TONSILLECTOMY     TUBAL LIGATION     UPPER GASTROINTESTINAL ENDOSCOPY  01-14-2004   in epic- dr Loreta Ave- gastritis     Family History  Adopted: Yes  Problem Relation Age of Onset   Heart disease Mother    Diabetes Mother    Hyperlipidemia Mother    Hypertension Mother    Heart attack Mother    Heart disease Father    Diabetes Father    Heart attack Father    Hypertension Father    Bladder  Cancer Father    Diabetes Brother    Lung cancer Paternal Grandfather    Colon cancer Neg Hx    Colon polyps Neg Hx    Rectal cancer Neg Hx    Stomach cancer Neg Hx     Social History   Tobacco Use   Smoking status: Every Day    Packs/day: 0.50    Years: 30.00    Pack years: 15.00    Types: Cigarettes    Start date: 01/29/1981   Smokeless tobacco: Never  Vaping Use   Vaping Use: Never used  Substance Use Topics   Alcohol use: Yes    Alcohol/week: 1.0 standard drink    Types: 1 Shots of liquor per week    Comment: very occassionally on social occassions   Drug use: No    Medications: I have reviewed the patient's current medications. Allergies as of 07/08/2021       Reactions   Bee Pollen Swelling, Other (See Comments)   Also ticks    Bactrim [sulfamethoxazole-trimethoprim] Hives   Cephalosporins Hives   Doxycycline Hives   Tetracyclines & Related Hives   Other Itching    Nuts causes itching and hives not sure what type   Augmentin [amoxicillin-pot Clavulanate] Diarrhea   Codeine Nausea Only   Reflex Pain Relief [menthol] Rash   Muscle Rub        Medication List        Accurate as of July 08, 2021 10:27 AM. If you have any questions, ask your nurse or doctor.          albuterol 108 (90 Base) MCG/ACT inhaler Commonly known as: VENTOLIN HFA INHALE 2 PUFFS INTO THE LUNGS EVERY 6 HOURS AS NEEDED FOR WHEEZE/SHORTNES OF BREATH   aspirin EC 81 MG tablet Take 81 mg by mouth daily.   atorvastatin 80 MG tablet Commonly known as: LIPITOR TAKE 1 TABLET BY MOUTH EVERY DAY   clonazePAM 1 MG tablet Commonly known as: KLONOPIN TAKE 1 TABLET BY MOUTH THREE TIMES A DAY   cyclobenzaprine 10 MG tablet Commonly known as: FLEXERIL TAKE 1 TABLET BY MOUTH THREE TIMES A DAY AS NEEDED FOR MUSCLE SPASMS   dicyclomine 20 MG tablet Commonly known as: BENTYL TAKE 1 TABLET (20 MG TOTAL) BY MOUTH 3 (THREE) TIMES DAILY WITH MEALS AS NEEDED FOR SPASMS.   diphenhydrAMINE 25 MG tablet Commonly known as: BENADRYL Take 50 mg by mouth every 6 (six) hours as needed for allergies.   EpiPen 2-Pak 0.3 mg/0.3 mL Soaj injection Generic drug: EPINEPHrine USE AS DIRECTED   fluticasone 50 MCG/ACT nasal spray Commonly known as: FLONASE Place 1 spray into both nostrils daily for 14 days.   hydrochlorothiazide 25 MG tablet Commonly known as: HYDRODIURIL TAKE 1 TABLET BY MOUTH EVERY DAY   meloxicam 15 MG tablet Commonly known as: MOBIC Take 1 tablet (15 mg total) by mouth daily.   metoprolol succinate 50 MG 24 hr tablet Commonly known as: TOPROL-XL Take 1 tablet (50 mg total) by mouth daily. Take with or immediately following a meal.   naproxen sodium 220 MG tablet Commonly known as: ALEVE Take 440 mg by mouth 2 (two) times daily as needed (pain).   ondansetron 4 MG tablet Commonly known as: Zofran Take 1 tablet (4 mg total) by mouth every 8 (eight) hours as  needed for nausea or vomiting.   traMADol 50 MG tablet Commonly known as: ULTRAM TAKE 2 TABLETS BY MOUTH EVERY 8 HOURS AS NEEDED  ROS:  A comprehensive review of systems was negative except for: Gastrointestinal: positive for abdominal pain  Blood pressure (!) 143/75, pulse 96, temperature 98.6 F (37 C), temperature source Other (Comment), resp. rate 16, height 4\' 9"  (1.448 m), weight 130 lb (59 kg), last menstrual period 12/26/2012, SpO2 96 %. Physical Exam Vitals reviewed.  Constitutional:      Appearance: Normal appearance.  HENT:     Head: Normocephalic.     Nose: Nose normal.  Eyes:     Extraocular Movements: Extraocular movements intact.  Cardiovascular:     Rate and Rhythm: Normal rate and regular rhythm.  Pulmonary:     Effort: Pulmonary effort is normal.     Breath sounds: Normal breath sounds.  Abdominal:     General: There is no distension.     Palpations: Abdomen is soft.     Tenderness: There is abdominal tenderness.     Hernia: A hernia is present.     Comments: 2cm hernia below xyphoid and supraumbilical hernia about 1-2 cm  Musculoskeletal:        General: No swelling.     Cervical back: Normal range of motion.  Skin:    General: Skin is warm.  Neurological:     General: No focal deficit present.     Mental Status: She is alert.  Psychiatric:        Mood and Affect: Mood normal.        Behavior: Behavior normal.        Thought Content: Thought content normal.    Results: Reviewed CT from 05/2018 and see two areas of corresponding to the hernias an area in the supraumbilical area and one just below the xyphoid, both have fat only and are between 1-2 cm   Assessment & Plan:  Felicia [name] is a 55 y.o. female with two incision hernias that need repaired. Given her extensive scar, I am going to repair both of these in an open manner. Discussed two incisions at the locations, risk of bleeding, infection, recurrence, use of mesh, injury to  bowel and primary repair.   She is on Disability and will not need FMLA for work.   All questions were answered to the satisfaction of the patient.   58 07/08/2021, 10:27 AM

## 2021-07-09 NOTE — H&P (Signed)
[facility] History and Physical   Chief Complaint   Follow-up     Felicia [name] is a 55 y.o. female.  HPI: Felicia [name] is a 55 yo who has two areas of incisional hernia after her aortobifemoral bypass, Felicia [name] also has A fib in the past but is not on blood thinners and had normal sinus rhythm on her last EKG.   Felicia [name] was evaluated by Cardiology and deemed acceptable risk of surgery.  Felicia [name] is having worsening pain and says that the hernias are getting larger. Felicia [name] had to recently lift her father off the ground after he fell and thinks that this has made things worse. Felicia [name] is ready to get it repaired as soon as possible.   Past Medical History:  Diagnosis Date   Allergy    year around   Anemia    Anxiety    Arthritis    Asthma    Atrial fibrillation (HCC)    Bipolar 1 disorder (HCC)    Borderline diabetes    Depression    Early cataract    right   Ectopic pregnancy    Fatty liver    Headache    Heart murmur    Hyperlipidemia    Insomnia    Peripheral arterial disease (HCC)    Pneumonia    PUD (peptic ulcer disease)    Right knee pain 12-05-2015   per pt.  Felicia [name] fell out of her husband's truck and landed on her right knee   Shortness of breath dyspnea    Wears glasses    Wears partial dentures     Past Surgical History:  Procedure Laterality Date   ABDOMINAL AORTAGRAM N/A 09/12/2012   Procedure: ABDOMINAL AORTAGRAM;  Surgeon: Brian L Chen, MD;  Location: [facility] CATH LAB;  Service: Cardiovascular;  Laterality: N/A;   AORTA - BILATERAL FEMORAL ARTERY BYPASS GRAFT N/A 10/23/2014   Procedure: AORTOBIFEMORAL BYPASS GRAFT;  Surgeon: Christopher S Dickson, MD;  Location: [facility] OR;  Service: Vascular;  Laterality: N/A;   BACK SURGERY     CESAREAN SECTION     COLONOSCOPY  01-14-2004   > 11 yr ago Dr Mann - normal - report in epic    DILATATION & CURETTAGE/HYSTEROSCOPY WITH MYOSURE N/A 10/15/2015   Procedure: DILATATION & CURETTAGE/HYSTEROSCOPY WITH MYOSURE;  Surgeon: Juan H  Fernandez, MD;  Location: [facility] ORS;  Service: Gynecology;  Laterality: N/A;   ESOPHAGOGASTRODUODENOSCOPY  01-14-2004   gastritis- dr mann- in epic already    MULTIPLE TOOTH EXTRACTIONS     NOSE SURGERY     NOSE SURGERY     SHOULDER ARTHROSCOPY WITH SUBACROMIAL DECOMPRESSION, ROTATOR CUFF REPAIR AND BICEP TENDON REPAIR Left 09/26/2019   Procedure: left shoulder arthroscopy, biceps release and tenodesis, mini open rotator cuff tear repair subscapularis and supraspinatus;  Surgeon: Dean, Gregory Scott, MD;  Location: [facility] OR;  Service: Orthopedics;  Laterality: Left;   SHOULDER SURGERY     rotator cuff repair    TONSILLECTOMY     TUBAL LIGATION     UPPER GASTROINTESTINAL ENDOSCOPY  01-14-2004   in epic- dr mann- gastritis     Family History  Adopted: Yes  Problem Relation Age of Onset   Heart disease Mother    Diabetes Mother    Hyperlipidemia Mother    Hypertension Mother    Heart attack Mother    Heart disease Father    Diabetes Father    Heart attack Father    Hypertension Father    Bladder   Cancer Father    Diabetes Brother    Lung cancer Paternal Grandfather    Colon cancer Neg Hx    Colon polyps Neg Hx    Rectal cancer Neg Hx    Stomach cancer Neg Hx     Social History   Tobacco Use   Smoking status: Every Day    Packs/day: 0.50    Years: 30.00    Pack years: 15.00    Types: Cigarettes    Start date: 01/29/1981   Smokeless tobacco: Never  Vaping Use   Vaping Use: Never used  Substance Use Topics   Alcohol use: Yes    Alcohol/week: 1.0 standard drink    Types: 1 Shots of liquor per week    Comment: very occassionally on social occassions   Drug use: No    Medications: I have reviewed the patient's current medications. Allergies as of 07/08/2021       Reactions   Bee Pollen Swelling, Other (See Comments)   Also ticks    Bactrim [sulfamethoxazole-trimethoprim] Hives   Cephalosporins Hives   Doxycycline Hives   Tetracyclines & Related Hives   Other Itching    Nuts causes itching and hives not sure what type   Augmentin [amoxicillin-pot Clavulanate] Diarrhea   Codeine Nausea Only   Reflex Pain Relief [menthol] Rash   Muscle Rub        Medication List        Accurate as of July 08, 2021 10:27 AM. If you have any questions, ask your nurse or doctor.          albuterol 108 (90 Base) MCG/ACT inhaler Commonly known as: VENTOLIN HFA INHALE 2 PUFFS INTO THE LUNGS EVERY 6 HOURS AS NEEDED FOR WHEEZE/SHORTNES OF BREATH   aspirin EC 81 MG tablet Take 81 mg by mouth daily.   atorvastatin 80 MG tablet Commonly known as: LIPITOR TAKE 1 TABLET BY MOUTH EVERY DAY   clonazePAM 1 MG tablet Commonly known as: KLONOPIN TAKE 1 TABLET BY MOUTH THREE TIMES A DAY   cyclobenzaprine 10 MG tablet Commonly known as: FLEXERIL TAKE 1 TABLET BY MOUTH THREE TIMES A DAY AS NEEDED FOR MUSCLE SPASMS   dicyclomine 20 MG tablet Commonly known as: BENTYL TAKE 1 TABLET (20 MG TOTAL) BY MOUTH 3 (THREE) TIMES DAILY WITH MEALS AS NEEDED FOR SPASMS.   diphenhydrAMINE 25 MG tablet Commonly known as: BENADRYL Take 50 mg by mouth every 6 (six) hours as needed for allergies.   EpiPen 2-Pak 0.3 mg/0.3 mL Soaj injection Generic drug: EPINEPHrine USE AS DIRECTED   fluticasone 50 MCG/ACT nasal spray Commonly known as: FLONASE Place 1 spray into both nostrils daily for 14 days.   hydrochlorothiazide 25 MG tablet Commonly known as: HYDRODIURIL TAKE 1 TABLET BY MOUTH EVERY DAY   meloxicam 15 MG tablet Commonly known as: MOBIC Take 1 tablet (15 mg total) by mouth daily.   metoprolol succinate 50 MG 24 hr tablet Commonly known as: TOPROL-XL Take 1 tablet (50 mg total) by mouth daily. Take with or immediately following a meal.   naproxen sodium 220 MG tablet Commonly known as: ALEVE Take 440 mg by mouth 2 (two) times daily as needed (pain).   ondansetron 4 MG tablet Commonly known as: Zofran Take 1 tablet (4 mg total) by mouth every 8 (eight) hours as  needed for nausea or vomiting.   traMADol 50 MG tablet Commonly known as: ULTRAM TAKE 2 TABLETS BY MOUTH EVERY 8 HOURS AS NEEDED           ROS:  A comprehensive review of systems was negative except for: Gastrointestinal: positive for abdominal pain  Blood pressure (!) 143/75, pulse 96, temperature 98.6 F (37 C), temperature source Other (Comment), resp. rate 16, height 4' 9" (1.448 m), weight 130 lb (59 kg), last menstrual period 12/26/2012, SpO2 96 %. Physical Exam Vitals reviewed.  Constitutional:      Appearance: Normal appearance.  HENT:     Head: Normocephalic.     Nose: Nose normal.  Eyes:     Extraocular Movements: Extraocular movements intact.  Cardiovascular:     Rate and Rhythm: Normal rate and regular rhythm.  Pulmonary:     Effort: Pulmonary effort is normal.     Breath sounds: Normal breath sounds.  Abdominal:     General: There is no distension.     Palpations: Abdomen is soft.     Tenderness: There is abdominal tenderness.     Hernia: A hernia is present.     Comments: 2cm hernia below xyphoid and supraumbilical hernia about 1-2 cm  Musculoskeletal:        General: No swelling.     Cervical back: Normal range of motion.  Skin:    General: Skin is warm.  Neurological:     General: No focal deficit present.     Mental Status: Felicia [name] is alert.  Psychiatric:        Mood and Affect: Mood normal.        Behavior: Behavior normal.        Thought Content: Thought content normal.    Results: Reviewed CT from 05/2018 and see two areas of corresponding to the hernias an area in the supraumbilical area and one just below the xyphoid, both have fat only and are between 1-2 cm   Assessment & Plan:  Felicia [name] is a 55 y.o. female with two incision hernias that need repaired. Given her extensive scar, I am going to repair both of these in an open manner. Discussed two incisions at the locations, risk of bleeding, infection, recurrence, use of mesh, injury to  bowel and primary repair.   Felicia [name] is on Disability and will not need FMLA for work.   All questions were answered to the satisfaction of the patient.   Ulrich Soules C Jeannelle Wiens 07/08/2021, 10:27 AM       

## 2021-07-23 ENCOUNTER — Other Ambulatory Visit (HOSPITAL_COMMUNITY): Payer: Medicare PPO

## 2021-07-25 DIAGNOSIS — K432 Incisional hernia without obstruction or gangrene: Secondary | ICD-10-CM | POA: Diagnosis present

## 2021-07-25 NOTE — Patient Instructions (Signed)
Felicia [name]  07/25/2021     @PREFPERIOPPHARMACY @   Your procedure is scheduled on  08/01/2021.   [facility] to [facility] at  0730 A.M.   Call this number if you have problems the morning of surgery:  [phone]   Remember:  Do not eat or drink after midnight.      Take these medicines the morning of surgery with A SIP OF WATER       clonazepam, flexeril (if needed), metoprolol, zofran(if needed), mobic or tramadol (if needed).    Do not wear jewelry, make-up or nail polish.  Do not wear lotions, powders, or perfumes, or deodorant.  Do not shave 48 hours prior to surgery.  Men may shave face and neck.  Do not bring valuables to the hospital.  [facility] is not responsible for any belongings or valuables.  Contacts, dentures or bridgework may not be worn into surgery.  Leave your suitcase in the car.  After surgery it may be brought to your room.  For patients admitted to the hospital, discharge time will be determined by your treatment team.  Patients discharged the day of surgery will not be allowed to drive home and must have someone with them for 24 hours.    Special instructions:   DO NOT smoke tobacco orvape fore 24 hours before your procedure.  Please read over the following fact sheets that you were given. Coughing and Deep Breathing, Surgical Site Infection Prevention, Anesthesia Post-op Instructions, and Care and Recovery After Surgery      Open Hernia Repair, Adult, Care After What can I expect after the procedure? After the procedure, it is common to have: Mild discomfort. Slight bruising. Mild swelling. Pain in the belly (abdomen). A small amount of blood from the cut from surgery (incision). Follow these instructions at home: Your doctor may give you more specific instructions. If you have problems, call your doctor. Medicines Take over-the-counter and prescription medicines only as told by your doctor. If told, take steps to prevent  problems with pooping (constipation). You may need to: Drink enough fluid to keep your pee (urine) pale yellow. Take medicines. You will be told what medicines to take. Eat foods that are high in fiber. These include beans, whole grains, and fresh fruits and vegetables. Limit foods that are high in fat and sugar. These include fried or sweet foods. Ask your doctor if you should avoid driving or using machines while you are taking your medicine. Incision care  Follow instructions from your doctor about how to take care of your incision. Make sure you: Wash your hands with soap and water for at least 20 seconds before and after you change your bandage (dressing). If you cannot use soap and water, use hand sanitizer. Change your bandage. Leave stitches or skin glue in place for at least 2 weeks. Leave tape strips alone unless you are told to take them off. You may trim the edges of the tape strips if they curl up. Check your incision every day for signs of infection. Check for: More redness, swelling, or pain. More fluid or blood. Warmth. Pus or a bad smell. Wear loose, soft clothing while your incision heals. Activity  Rest as told by your doctor. Do not lift anything that is heavier than 10 lb (4.5 kg), or the limit that you are told. Do not play contact sports until your doctor says that this is safe. If you were given a  sedative during your procedure, do not drive or use machines until your doctor says that it is safe. A sedative is a medicine that helps you relax. Return to your normal activities when your doctor says that it is safe. General instructions Do not take baths, swim, or use a hot tub. Ask your doctor about taking showers or sponge baths. Hold a pillow over your belly when you cough or sneeze. This helps with pain. Do not smoke or use any products that contain nicotine or tobacco. If you need help quitting, ask your doctor. Keep all follow-up visits. Contact a doctor  if: You have any of these signs of infection in or around your incision: More redness, swelling, or pain. More fluid or blood. Warmth. Pus. A bad smell. You have a fever or chills. You have blood in your poop (stool). You have not pooped (had a bowel movement) in 2-3 days. Medicine does not help your pain. Get help right away if: You have chest pain, or you are short of breath. You feel faint or light-headed. You have very bad pain. You vomit and your pain is worse. You have pain, swelling, or redness in a leg. These symptoms may be an emergency. Get help right away. Call your local emergency services (911 in the [place] Do not wait to see if the symptoms will go away. Do not drive yourself to the hospital. Summary After this procedure, it is common to have mild discomfort, slight bruising, and mild swelling. Follow instructions from your doctor about how to take care of your cut from surgery (incision). Check every day for signs of infection. Do not lift heavy objects or play contact sports until your doctor says it is safe. Return to your normal activities as told by your doctor. This information is not intended to replace advice given to you by your health care provider. Make sure you discuss any questions you have with your health care provider. Document Revised: 06/24/2020 Document Reviewed: 06/24/2020 Elsevier Patient Education  2022 [employer]. General Anesthesia, Adult, Care After This sheet gives you information about how to care for yourself after your procedure. Your health care provider may also give you more specific instructions. If you have problems or questions, contact your health care provider. What can I expect after the procedure? After the procedure, the following side effects are common: Pain or discomfort at the IV site. Nausea. Vomiting. Sore throat. Trouble concentrating. Feeling cold or chills. Feeling weak or tired. Sleepiness and fatigue. Soreness  and body aches. These side effects can affect parts of the body that were not involved in surgery. Follow these instructions at home: For the time period you were told by your health care provider:  Rest. Do not participate in activities where you could fall or become injured. Do not drive or use machinery. Do not drink alcohol. Do not take sleeping pills or medicines that cause drowsiness. Do not make important decisions or sign legal documents. Do not take care of children on your own. Eating and drinking Follow any instructions from your health care provider about eating or drinking restrictions. When you feel hungry, start by eating small amounts of foods that are soft and easy to digest (bland), such as toast. Gradually return to your regular diet. Drink enough fluid to keep your urine pale yellow. If you vomit, rehydrate by drinking water, juice, or clear broth. General instructions If you have sleep apnea, surgery and certain medicines can increase your risk for breathing problems. Follow  instructions from your health care provider about wearing your sleep device: Anytime you are sleeping, including during daytime naps. While taking prescription pain medicines, sleeping medicines, or medicines that make you drowsy. Have a responsible adult stay with you for the time you are told. It is important to have someone help care for you until you are awake and alert. Return to your normal activities as told by your health care provider. Ask your health care provider what activities are safe for you. Take over-the-counter and prescription medicines only as told by your health care provider. If you smoke, do not smoke without supervision. Keep all follow-up visits as told by your health care provider. This is important. Contact a health care provider if: You have nausea or vomiting that does not get better with medicine. You cannot eat or drink without vomiting. You have pain that does not  get better with medicine. You are unable to pass urine. You develop a skin rash. You have a fever. You have redness around your IV site that gets worse. Get help right away if: You have difficulty breathing. You have chest pain. You have blood in your urine or stool, or you vomit blood. Summary After the procedure, it is common to have a sore throat or nausea. It is also common to feel tired. Have a responsible adult stay with you for the time you are told. It is important to have someone help care for you until you are awake and alert. When you feel hungry, start by eating small amounts of foods that are soft and easy to digest (bland), such as toast. Gradually return to your regular diet. Drink enough fluid to keep your urine pale yellow. Return to your normal activities as told by your health care provider. Ask your health care provider what activities are safe for you. This information is not intended to replace advice given to you by your health care provider. Make sure you discuss any questions you have with your health care provider. Document Revised: 07/25/2020 Document Reviewed: 02/22/2020 Elsevier Patient Education  2022 Elsevier Inc. How to Use Chlorhexidine for Bathing Chlorhexidine gluconate (CHG) is a germ-killing (antiseptic) solution that is used to clean the skin. It can get rid of the bacteria that normally live on the skin and can keep them away for about 24 hours. To clean your skin with CHG, you may be given: A CHG solution to use in the shower or as part of a sponge bath. A prepackaged cloth that contains CHG. Cleaning your skin with CHG may help lower the risk for infection: While you are staying in the intensive care unit of the hospital. If you have a vascular access, such as a central line, to provide short-term or long-term access to your veins. If you have a catheter to drain urine from your bladder. If you are on a ventilator. A ventilator is a machine that  helps you breathe by moving air in and out of your lungs. After surgery. What are the risks? Risks of using CHG include: A skin reaction. Hearing loss, if CHG gets in your ears and you have a perforated eardrum. Eye injury, if CHG gets in your eyes and is not rinsed out. The CHG product catching fire. Make sure that you avoid smoking and flames after applying CHG to your skin. Do not use CHG: If you have a chlorhexidine allergy or have previously reacted to chlorhexidine. On babies younger than 37 months of age. How to use CHG solution  Use CHG only as told by your health care provider, and follow the instructions on the label. Use the full amount of CHG as directed. Usually, this is one bottle. During a shower Follow these steps when using CHG solution during a shower (unless your health care provider gives you different instructions): Start the shower. Use your normal soap and shampoo to wash your face and hair. Turn off the shower or move out of the shower stream. Pour the CHG onto a clean washcloth. Do not use any type of brush or rough-edged sponge. Starting at your neck, lather your body down to your toes. Make sure you follow these instructions: If you will be having surgery, pay special attention to the part of your body where you will be having surgery. Scrub this area for at least 1 minute. Do not use CHG on your head or face. If the solution gets into your ears or eyes, rinse them well with water. Avoid your genital area. Avoid any areas of skin that have broken skin, cuts, or scrapes. Scrub your back and under your arms. Make sure to wash skin folds. Let the lather sit on your skin for 1-2 minutes or as long as told by your health care provider. Thoroughly rinse your entire body in the shower. Make sure that all body creases and crevices are rinsed well. Dry off with a clean towel. Do not put any substances on your body afterward--such as powder, lotion, or perfume--unless you  are told to do so by your health care provider. Only use lotions that are recommended by the manufacturer. Put on clean clothes or pajamas. If it is the night before your surgery, sleep in clean sheets.  During a sponge bath Follow these steps when using CHG solution during a sponge bath (unless your health care provider gives you different instructions): Use your normal soap and shampoo to wash your face and hair. Pour the CHG onto a clean washcloth. Starting at your neck, lather your body down to your toes. Make sure you follow these instructions: If you will be having surgery, pay special attention to the part of your body where you will be having surgery. Scrub this area for at least 1 minute. Do not use CHG on your head or face. If the solution gets into your ears or eyes, rinse them well with water. Avoid your genital area. Avoid any areas of skin that have broken skin, cuts, or scrapes. Scrub your back and under your arms. Make sure to wash skin folds. Let the lather sit on your skin for 1-2 minutes or as long as told by your health care provider. Using a different clean, wet washcloth, thoroughly rinse your entire body. Make sure that all body creases and crevices are rinsed well. Dry off with a clean towel. Do not put any substances on your body afterward--such as powder, lotion, or perfume--unless you are told to do so by your health care provider. Only use lotions that are recommended by the manufacturer. Put on clean clothes or pajamas. If it is the night before your surgery, sleep in clean sheets. How to use CHG prepackaged cloths Only use CHG cloths as told by your health care provider, and follow the instructions on the label. Use the CHG cloth on clean, dry skin. Do not use the CHG cloth on your head or face unless your health care provider tells you to. When washing with the CHG cloth: Avoid your genital area. Avoid any areas of skin that  have broken skin, cuts, or  scrapes. Before surgery Follow these steps when using a CHG cloth to clean before surgery (unless your health care provider gives you different instructions): Using the CHG cloth, vigorously scrub the part of your body where you will be having surgery. Scrub using a back-and-forth motion for 3 minutes. The area on your body should be completely wet with CHG when you are done scrubbing. Do not rinse. Discard the cloth and let the area air-dry. Do not put any substances on the area afterward, such as powder, lotion, or perfume. Put on clean clothes or pajamas. If it is the night before your surgery, sleep in clean sheets.  For general bathing Follow these steps when using CHG cloths for general bathing (unless your health care provider gives you different instructions). Use a separate CHG cloth for each area of your body. Make sure you wash between any folds of skin and between your fingers and toes. Wash your body in the following order, switching to a new cloth after each step: The front of your neck, shoulders, and chest. Both of your arms, under your arms, and your hands. Your stomach and groin area, avoiding the genitals. Your right leg and foot. Your left leg and foot. The back of your neck, your back, and your buttocks. Do not rinse. Discard the cloth and let the area air-dry. Do not put any substances on your body afterward--such as powder, lotion, or perfume--unless you are told to do so by your health care provider. Only use lotions that are recommended by the manufacturer. Put on clean clothes or pajamas. Contact a health care provider if: Your skin gets irritated after scrubbing. You have questions about using your solution or cloth. You swallow any chlorhexidine. Call your local poison control center (403-625-4038 in the [place] Get help right away if: Your eyes itch badly, or they become very red or swollen. Your skin itches badly and is red or swollen. Your hearing  changes. You have trouble seeing. You have swelling or tingling in your mouth or throat. You have trouble breathing. These symptoms may represent a serious problem that is an emergency. Do not wait to see if the symptoms will go away. Get medical help right away. Call your local emergency services (911 in the [place] Do not drive yourself to the hospital. Summary Chlorhexidine gluconate (CHG) is a germ-killing (antiseptic) solution that is used to clean the skin. Cleaning your skin with CHG may help to lower your risk for infection. You may be given CHG to use for bathing. It may be in a bottle or in a prepackaged cloth to use on your skin. Carefully follow your health care provider's instructions and the instructions on the product label. Do not use CHG if you have a chlorhexidine allergy. Contact your health care provider if your skin gets irritated after scrubbing. This information is not intended to replace advice given to you by your health care provider. Make sure you discuss any questions you have with your health care provider. Document Revised: 01/20/2021 Document Reviewed: 01/20/2021 Elsevier Patient Education  2022 [employer].

## 2021-07-30 ENCOUNTER — Encounter (HOSPITAL_COMMUNITY)
Admission: RE | Admit: 2021-07-30 | Discharge: 2021-07-30 | Disposition: A | Discharge status: Home / Self Care | Payer: Medicare PPO | Source: Ambulatory Visit | Attending: General Surgery | Admitting: General Surgery

## 2021-07-30 NOTE — Pre-Procedure Instructions (Signed)
Patient was a no show for PAT today. Message left for [facility] at Dr Lovell Sheehan office.

## 2021-07-31 ENCOUNTER — Other Ambulatory Visit: Payer: Self-pay

## 2021-07-31 ENCOUNTER — Encounter (HOSPITAL_COMMUNITY)
Admission: RE | Admit: 2021-07-31 | Discharge: 2021-07-31 | Disposition: A | Discharge status: Home / Self Care | Payer: Medicare PPO | Source: Ambulatory Visit | Attending: General Surgery | Admitting: General Surgery

## 2021-07-31 ENCOUNTER — Other Ambulatory Visit: Payer: Self-pay | Admitting: Family Medicine

## 2021-07-31 DIAGNOSIS — E876 Hypokalemia: Secondary | ICD-10-CM

## 2021-07-31 DIAGNOSIS — Z01812 Encounter for preprocedural laboratory examination: Secondary | ICD-10-CM | POA: Insufficient documentation

## 2021-07-31 LAB — CBC WITH DIFFERENTIAL/PLATELET
Abs Immature Granulocytes: 0.03 10*3/uL (ref 0.00–0.07)
Basophils Absolute: 0.1 10*3/uL (ref 0.0–0.1)
Basophils Relative: 1 %
Eosinophils Absolute: 0.2 10*3/uL (ref 0.0–0.5)
Eosinophils Relative: 3 %
HCT: 40.8 % (ref 36.0–46.0)
Hemoglobin: 13.4 g/dL (ref 12.0–15.0)
Immature Granulocytes: 0 %
Lymphocytes Relative: 35 %
Lymphs Abs: 2.7 10*3/uL (ref 0.7–4.0)
MCH: 31.2 pg (ref 26.0–34.0)
MCHC: 32.8 g/dL (ref 30.0–36.0)
MCV: 94.9 fL (ref 80.0–100.0)
Monocytes Absolute: 0.5 10*3/uL (ref 0.1–1.0)
Monocytes Relative: 6 %
Neutro Abs: 4.1 10*3/uL (ref 1.7–7.7)
Neutrophils Relative %: 55 %
Platelets: 248 10*3/uL (ref 150–400)
RBC: 4.3 MIL/uL (ref 3.87–5.11)
RDW: 12.6 % (ref 11.5–15.5)
WBC: 7.6 10*3/uL (ref 4.0–10.5)
nRBC: 0 % (ref 0.0–0.2)

## 2021-07-31 LAB — BASIC METABOLIC PANEL
Anion gap: 11 (ref 5–15)
BUN: 19 mg/dL (ref 6–20)
CO2: 28 mmol/L (ref 22–32)
Calcium: 9 mg/dL (ref 8.9–10.3)
Chloride: 104 mmol/L (ref 98–111)
Creatinine, Ser: 1.19 mg/dL — ABNORMAL HIGH (ref 0.44–1.00)
GFR, Estimated: 53 mL/min — ABNORMAL LOW (ref >60)
Glucose, Bld: 122 mg/dL — ABNORMAL HIGH (ref 70–99)
Potassium: 2.8 mmol/L — ABNORMAL LOW (ref 3.5–5.1)
Sodium: 143 mmol/L (ref 135–145)

## 2021-07-31 MED ORDER — POTASSIUM CHLORIDE CRYS ER 20 MEQ PO TBCR
40.0000 meq | EXTENDED_RELEASE_TABLET | Freq: Two times a day (BID) | ORAL | 0 refills | Status: DC
Start: 2021-07-31 — End: 2021-10-02

## 2021-08-01 ENCOUNTER — Ambulatory Visit (HOSPITAL_COMMUNITY): Payer: Medicare PPO | Admitting: Anesthesiology

## 2021-08-01 ENCOUNTER — Ambulatory Visit (HOSPITAL_COMMUNITY)
Admission: RE | Admit: 2021-08-01 | Discharge: 2021-08-01 | Disposition: A | Discharge status: Home / Self Care | Payer: Medicare PPO | Attending: General Surgery | Admitting: General Surgery

## 2021-08-01 ENCOUNTER — Encounter (HOSPITAL_COMMUNITY): Payer: Self-pay | Admitting: General Surgery

## 2021-08-01 ENCOUNTER — Encounter (HOSPITAL_COMMUNITY)
Admission: RE | Disposition: A | Discharge status: Home / Self Care | Payer: Self-pay | Source: Home / Self Care | Attending: General Surgery

## 2021-08-01 DIAGNOSIS — F17210 Nicotine dependence, cigarettes, uncomplicated: Secondary | ICD-10-CM | POA: Insufficient documentation

## 2021-08-01 DIAGNOSIS — Z791 Long term (current) use of non-steroidal anti-inflammatories (NSAID): Secondary | ICD-10-CM | POA: Insufficient documentation

## 2021-08-01 DIAGNOSIS — Z7982 Long term (current) use of aspirin: Secondary | ICD-10-CM | POA: Insufficient documentation

## 2021-08-01 DIAGNOSIS — K432 Incisional hernia without obstruction or gangrene: Secondary | ICD-10-CM | POA: Insufficient documentation

## 2021-08-01 DIAGNOSIS — Z79899 Other long term (current) drug therapy: Secondary | ICD-10-CM | POA: Diagnosis not present

## 2021-08-01 DIAGNOSIS — J45909 Unspecified asthma, uncomplicated: Secondary | ICD-10-CM | POA: Diagnosis not present

## 2021-08-01 HISTORY — PX: INCISIONAL HERNIA REPAIR: SHX193

## 2021-08-01 LAB — POCT I-STAT, CHEM 8
BUN: 24 mg/dL — ABNORMAL HIGH (ref 6–20)
Calcium, Ion: 0.99 mmol/L — ABNORMAL LOW (ref 1.15–1.40)
Chloride: 103 mmol/L (ref 98–111)
Creatinine, Ser: 1.2 mg/dL — ABNORMAL HIGH (ref 0.44–1.00)
Glucose, Bld: 91 mg/dL (ref 70–99)
HCT: 40 % (ref 36.0–46.0)
Hemoglobin: 13.6 g/dL (ref 12.0–15.0)
Potassium: 4.4 mmol/L (ref 3.5–5.1)
Sodium: 140 mmol/L (ref 135–145)
TCO2: 33 mmol/L — ABNORMAL HIGH (ref 22–32)

## 2021-08-01 SURGERY — REPAIR, HERNIA, INCISIONAL
Anesthesia: General | Site: Abdomen

## 2021-08-01 MED ORDER — CHLORHEXIDINE GLUCONATE CLOTH 2 % EX PADS: 6.0000 | MEDICATED_PAD | Freq: Once | CUTANEOUS | Status: DC

## 2021-08-01 MED ORDER — BUPIVACAINE LIPOSOME 1.3 % IJ SUSP
INTRAMUSCULAR | Status: DC | PRN
  Administered 2021-08-01: 20 mL

## 2021-08-01 MED ORDER — FENTANYL CITRATE (PF) 250 MCG/5ML IJ SOLN
INTRAMUSCULAR | Status: AC
  Filled 2021-08-01: qty 5

## 2021-08-01 MED ORDER — FENTANYL CITRATE (PF) 100 MCG/2ML IJ SOLN
INTRAMUSCULAR | Status: DC | PRN
  Administered 2021-08-01: 50 ug via INTRAVENOUS
  Administered 2021-08-01: 100 ug via INTRAVENOUS
  Administered 2021-08-01 (×2): 50 ug via INTRAVENOUS

## 2021-08-01 MED ORDER — BUPIVACAINE LIPOSOME 1.3 % IJ SUSP
INTRAMUSCULAR | Status: AC
  Filled 2021-08-01: qty 20

## 2021-08-01 MED ORDER — ONDANSETRON HCL 4 MG/2ML IJ SOLN
INTRAMUSCULAR | Status: DC | PRN
  Administered 2021-08-01: 4 mg via INTRAVENOUS

## 2021-08-01 MED ORDER — DEXAMETHASONE SODIUM PHOSPHATE 10 MG/ML IJ SOLN
INTRAMUSCULAR | Status: DC | PRN
  Administered 2021-08-01: 10 mg via INTRAVENOUS

## 2021-08-01 MED ORDER — CIPROFLOXACIN IN D5W 400 MG/200ML IV SOLN
400.0000 mg | INTRAVENOUS | Status: AC
  Administered 2021-08-01: 400 mg via INTRAVENOUS
  Filled 2021-08-01: qty 200

## 2021-08-01 MED ORDER — ROCURONIUM BROMIDE 100 MG/10ML IV SOLN
INTRAVENOUS | Status: DC | PRN
  Administered 2021-08-01: 50 mg via INTRAVENOUS

## 2021-08-01 MED ORDER — FENTANYL CITRATE PF 50 MCG/ML IJ SOSY
25.0000 ug | PREFILLED_SYRINGE | INTRAMUSCULAR | Status: DC | PRN
  Administered 2021-08-01 (×2): 50 ug via INTRAVENOUS
  Filled 2021-08-01: qty 1

## 2021-08-01 MED ORDER — ONDANSETRON HCL 4 MG/2ML IJ SOLN: 4.0000 mg | Freq: Once | INTRAMUSCULAR | Status: DC | PRN

## 2021-08-01 MED ORDER — DEXAMETHASONE SODIUM PHOSPHATE 10 MG/ML IJ SOLN
INTRAMUSCULAR | Status: AC
  Filled 2021-08-01: qty 1

## 2021-08-01 MED ORDER — EPHEDRINE 5 MG/ML INJ
INTRAVENOUS | Status: AC
  Filled 2021-08-01: qty 5

## 2021-08-01 MED ORDER — 0.9 % SODIUM CHLORIDE (POUR BTL) OPTIME
TOPICAL | Status: DC | PRN
  Administered 2021-08-01: 1000 mL

## 2021-08-01 MED ORDER — ONDANSETRON HCL 4 MG/2ML IJ SOLN
INTRAMUSCULAR | Status: AC
  Filled 2021-08-01: qty 2

## 2021-08-01 MED ORDER — ROCURONIUM BROMIDE 10 MG/ML (PF) SYRINGE
PREFILLED_SYRINGE | INTRAVENOUS | Status: AC
  Filled 2021-08-01: qty 10

## 2021-08-01 MED ORDER — CHLORHEXIDINE GLUCONATE 0.12 % MT SOLN
15.0000 mL | Freq: Once | OROMUCOSAL | Status: AC
  Administered 2021-08-01: 15 mL via OROMUCOSAL

## 2021-08-01 MED ORDER — LIDOCAINE HCL (PF) 2 % IJ SOLN
INTRAMUSCULAR | Status: AC
  Filled 2021-08-01: qty 5

## 2021-08-01 MED ORDER — LIDOCAINE HCL (CARDIAC) PF 100 MG/5ML IV SOSY
PREFILLED_SYRINGE | INTRAVENOUS | Status: DC | PRN
  Administered 2021-08-01: 100 mg via INTRAVENOUS

## 2021-08-01 MED ORDER — PHENYLEPHRINE HCL (PRESSORS) 10 MG/ML IV SOLN
INTRAVENOUS | Status: DC | PRN
  Administered 2021-08-01 (×2): 120 ug via INTRAVENOUS
  Administered 2021-08-01 (×2): 80 ug via INTRAVENOUS

## 2021-08-01 MED ORDER — EPHEDRINE SULFATE 50 MG/ML IJ SOLN
INTRAMUSCULAR | Status: DC | PRN
  Administered 2021-08-01: 5 mg via INTRAVENOUS
  Administered 2021-08-01 (×4): 10 mg via INTRAVENOUS

## 2021-08-01 MED ORDER — PROPOFOL 10 MG/ML IV BOLUS
INTRAVENOUS | Status: DC | PRN
  Administered 2021-08-01: 150 mg via INTRAVENOUS

## 2021-08-01 MED ORDER — ONDANSETRON HCL 4 MG PO TABS: 4.0000 mg | ORAL_TABLET | Freq: Three times a day (TID) | ORAL | 0 refills | Status: DC | PRN

## 2021-08-01 MED ORDER — KETOROLAC TROMETHAMINE 15 MG/ML IJ SOLN
15.0000 mg | Freq: Once | INTRAMUSCULAR | Status: AC
  Administered 2021-08-01: 15 mg via INTRAVENOUS

## 2021-08-01 MED ORDER — MIDAZOLAM HCL 5 MG/5ML IJ SOLN
INTRAMUSCULAR | Status: DC | PRN
  Administered 2021-08-01: 2 mg via INTRAVENOUS

## 2021-08-01 MED ORDER — KETOROLAC TROMETHAMINE 15 MG/ML IJ SOLN
INTRAMUSCULAR | Status: AC
  Filled 2021-08-01: qty 1

## 2021-08-01 MED ORDER — HYDROCODONE-ACETAMINOPHEN 5-325 MG PO TABS: 1.0000 | ORAL_TABLET | ORAL | 0 refills | Status: DC | PRN

## 2021-08-01 MED ORDER — SUGAMMADEX SODIUM 200 MG/2ML IV SOLN
INTRAVENOUS | Status: DC | PRN
  Administered 2021-08-01: 125 mg via INTRAVENOUS

## 2021-08-01 MED ORDER — MIDAZOLAM HCL 2 MG/2ML IJ SOLN
INTRAMUSCULAR | Status: AC
  Filled 2021-08-01: qty 2

## 2021-08-01 MED ORDER — LACTATED RINGERS IV SOLN
INTRAVENOUS | Status: DC
  Administered 2021-08-01: 1000 mL via INTRAVENOUS
  Administered 2021-08-01: 500 mL via INTRAVENOUS

## 2021-08-01 MED ORDER — FENTANYL CITRATE PF 50 MCG/ML IJ SOSY
PREFILLED_SYRINGE | INTRAMUSCULAR | Status: AC
  Filled 2021-08-01: qty 1

## 2021-08-01 SURGICAL SUPPLY — 49 items
ADH SKN CLS APL DERMABOND .7 (GAUZE/BANDAGES/DRESSINGS) ×1
APL PRP STRL LF DISP 70% ISPRP (MISCELLANEOUS) ×1
CHLORAPREP W/TINT 26 (MISCELLANEOUS) ×2 IMPLANT
CLOTH BEACON ORANGE TIMEOUT ST (SAFETY) ×2 IMPLANT
COVER LIGHT HANDLE STERIS (MISCELLANEOUS) ×4 IMPLANT
DERMABOND ADVANCED (GAUZE/BANDAGES/DRESSINGS) ×1
DERMABOND ADVANCED .7 DNX12 (GAUZE/BANDAGES/DRESSINGS) ×1 IMPLANT
ELECT REM PT RETURN 9FT ADLT (ELECTROSURGICAL) ×2
ELECTRODE REM PT RTRN 9FT ADLT (ELECTROSURGICAL) ×1 IMPLANT
GAUZE 4X4 16PLY ~~LOC~~+RFID DBL (SPONGE) ×2 IMPLANT
GAUZE SPONGE 4X4 12PLY STRL (GAUZE/BANDAGES/DRESSINGS) ×2 IMPLANT
GLOVE SURG ENC MOIS LTX SZ6.5 (GLOVE) ×2 IMPLANT
GLOVE SURG LTX SZ7 (GLOVE) ×2 IMPLANT
GLOVE SURG POLYISO LF SZ7.5 (GLOVE) ×2 IMPLANT
GLOVE SURG UNDER POLY LF SZ6.5 (GLOVE) ×2 IMPLANT
GLOVE SURG UNDER POLY LF SZ7 (GLOVE) ×4 IMPLANT
GLOVE SURG UNDER POLY LF SZ7.5 (GLOVE) ×2 IMPLANT
GOWN STRL REUS W/TWL LRG LVL3 (GOWN DISPOSABLE) ×6 IMPLANT
INST SET MINOR GENERAL (KITS) ×2 IMPLANT
KIT TURNOVER KIT A (KITS) ×2 IMPLANT
MANIFOLD NEPTUNE II (INSTRUMENTS) ×2 IMPLANT
MESH VENTRALEX ST 1-7/10 CRC S (Mesh General) ×2 IMPLANT
NEEDLE HYPO 18GX1.5 BLUNT FILL (NEEDLE) ×2 IMPLANT
NEEDLE HYPO 21X1.5 SAFETY (NEEDLE) ×2 IMPLANT
NS IRRIG 1000ML POUR BTL (IV SOLUTION) ×2 IMPLANT
PACK MAJOR ABDOMINAL (CUSTOM PROCEDURE TRAY) IMPLANT
PACK MINOR (CUSTOM PROCEDURE TRAY) ×2 IMPLANT
PAD ARMBOARD 7.5X6 YLW CONV (MISCELLANEOUS) ×2 IMPLANT
SET BASIN LINEN APH (SET/KITS/TRAYS/PACK) ×2 IMPLANT
STAPLER VISISTAT (STAPLE) IMPLANT
SUT ETHIBOND 0 MO6 C/R (SUTURE) ×2 IMPLANT
SUT ETHIBOND NAB MO 7 #0 18IN (SUTURE) ×2 IMPLANT
SUT MNCRL AB 4-0 PS2 18 (SUTURE) ×4 IMPLANT
SUT NOVA NAB GS-21 1 T12 (SUTURE) IMPLANT
SUT NOVA NAB GS-22 2 2-0 T-19 (SUTURE) IMPLANT
SUT NOVA NAB GS-26 0 60 (SUTURE) IMPLANT
SUT PROLENE 0 CT 1 CR/8 (SUTURE) IMPLANT
SUT PROLENE 2 0 SH 30 (SUTURE) IMPLANT
SUT SILK 2 0 (SUTURE)
SUT SILK 2-0 18XBRD TIE 12 (SUTURE) IMPLANT
SUT SILK 3 0 (SUTURE)
SUT SILK 3-0 18XBRD TIE 12 (SUTURE) IMPLANT
SUT VIC AB 2-0 CT1 27 (SUTURE) ×2
SUT VIC AB 2-0 CT1 TAPERPNT 27 (SUTURE) ×1 IMPLANT
SUT VIC AB 3-0 SH 27 (SUTURE) ×4
SUT VIC AB 3-0 SH 27X BRD (SUTURE) ×2 IMPLANT
SUT VIC AB 4-0 PS2 27 (SUTURE) ×2 IMPLANT
SUT VICRYL AB 2 0 TIES (SUTURE) IMPLANT
SYR 20ML LL LF (SYRINGE) ×4 IMPLANT

## 2021-08-01 NOTE — Interval H&P Note (Signed)
History and Physical Interval Note:  08/01/2021 8:41 AM  Felicia [name]  has presented today for surgery, with the diagnosis of Incisional hernia.  The various methods of treatment have been discussed with the patient and family. After consideration of risks, benefits and other options for treatment, the patient has consented to  Procedure(s): OPEN HERNIA REPAIR INCISIONAL WITH MESH (N/A) as a surgical intervention.  The patient's history has been reviewed, patient examined, no change in status, stable for surgery.  I have reviewed the patient's chart and labs.  Questions were answered to the patient's satisfaction.   2 areas marked for hernia repair.   Lucretia Roers

## 2021-08-01 NOTE — Progress Notes (Addendum)
[facility]  Updated husband. Has tramadol at home and refilled in September 180 pills. She does not think the tramadol will be enough for her surgery, will send in norco 5-325 q 4 PRN #20 and let Dr. Tanya Nones know. Zofran refilled. Binder ordered.  [name], MD [facility] [address] [place] [place], [place] [address] [phone] (office)

## 2021-08-01 NOTE — Op Note (Signed)
[facility] Operative Note  08/01/21  Preoperative Diagnosis: Incisional hernia epigastric and supraumbilical    Postoperative Diagnosis: Same   Procedure(s) Performed: Incisional hernia repair with mesh epigastric hernia 4.3 Ventralex ST Patch and primary repair of supraumbilical hernia    Surgeon: Leatrice Jewels. Henreitta Leber, MD   Assistants: No qualified resident was available    Anesthesia: General endotracheal   Anesthesiologist: Windell Norfolk, MD    Specimens: None    Estimated Blood Loss: Minimal   Blood Replacement: None    Complications: None   Wound Class:Clean    Operative Indications: Felicia [name] is a 55 yo with a large incision from an aortobifemoral bypass. She has two defects and wants this repaired. She has some discomfort from this at times. We discussed repair, risk of recurrence, risk of injury to bowel, risk of bleeding, mesh repair versus primary repair.   Findings: Epigastric defect 2cm; supraumbilical defect <1 cm    Procedure: The patient was taken to the operating room and placed supine. General endotracheal anesthesia was induced. Intravenous antibiotics were administered per protocol.  The abdomen was prepared and draped in the usual sterile fashion.   The epigastric hernia was noted to be reducible and measured about 2cm. An incision was made over the area, and carried down through the subcutaneous tissue with electrocautery.  Dissection was performed down to the level of the fascia, exposing the hernia sac.  The hernia sac was opened with care, and excess hernia sac was resected with electrocautery.  A finger was ran on the underlying peritoneum and this was clear.  A 4.3 cm Ventralex St Hernia Patch was placed and secured with 0 Ethibond sutures ensuring that it was against the peritoneal cavity.  The hernia defect was then closed with 0 Ethibond suture in an interrupted fashion over the patch.  The liver was right under this patch and  likelihood of any bowel getting into this defect was minimal.  The incision was injected with Exparel and closed with interrupted 3-0 Vicryl and 4-0 Monocryl subcuticular and dermabond. The supraumbilical defect was felt and had be marked previously. An incision was made in her prior incision scar and carried down to the fascia. Th defect was noted and was less than 1cm in size and had fat in the hernia. The fat was dissected out and the hernia was reduced. The area was closed with 0 Ethibond suture primarily.  The incision was injected with Exparel and closed with 3-0 Vicryl interrupted and 4-0 Monocryl subcuticular and dermabond.     All counts were correct at the end of the case. The patient was awakened from anesthesia and extubated without complication.  The patient went to the PACU in stable condition.  [name], MD [facility] [address] [place] [place], [place] [address] [phone] (office)

## 2021-08-01 NOTE — Anesthesia Postprocedure Evaluation (Signed)
Anesthesia Post Note  Patient: JERRIYAH LOUIS  Procedure(s) Performed: INCISIONAL HERNIA REPAIR TIMES TWO, ONE WITH VENTRALEX ST HERNIA MESH, THE SECOND BY PRIMARY CLOSURE (Abdomen)  Patient location during evaluation: Phase II Anesthesia Type: General Level of consciousness: awake Pain management: pain level controlled Vital Signs Assessment: post-procedure vital signs reviewed and stable Respiratory status: spontaneous breathing and respiratory function stable Cardiovascular status: blood pressure returned to baseline and stable Postop Assessment: no headache and no apparent nausea or vomiting Anesthetic complications: no Comments: Late entry   No notable events documented.   Last Vitals:  Vitals:   08/01/21 1200 08/01/21 1211  BP:  (!) 122/58  Pulse: 66 70  Resp: (!) 8 16  Temp:    SpO2: 93% 94%    Last Pain:  Vitals:   08/01/21 1211  TempSrc:   PainSc: 0-No pain                 Windell Norfolk

## 2021-08-01 NOTE — Anesthesia Procedure Notes (Signed)
Procedure Name: Intubation Date/Time: 08/01/2021 9:19 AM Performed by: Junious Silk, CRNA Pre-anesthesia Checklist: Patient identified, Emergency Drugs available, Suction available, Patient being monitored and Timeout performed Patient Re-evaluated:Patient Re-evaluated prior to induction Oxygen Delivery Method: Circle system utilized Preoxygenation: Pre-oxygenation with 100% oxygen Induction Type: IV induction Ventilation: Mask ventilation without difficulty Laryngoscope Size: Miller and 2 Grade View: Grade I Tube type: Oral Number of attempts: 1 Airway Equipment and Method: Stylet Placement Confirmation: ETT inserted through vocal cords under direct vision, positive ETCO2, CO2 detector and breath sounds checked- equal and bilateral Secured at: 20 cm Tube secured with: Tape Dental Injury: Teeth and Oropharynx as per pre-operative assessment

## 2021-08-01 NOTE — Discharge Instructions (Signed)
Discharge Instructions Hernia:  Common Complaints: Pain at the incision site is common. This will improve with time. Take your pain medications as described below. Some nausea is common and poor appetite. The main goal is to stay hydrated the first few days after surgery.   Diet/ Activity: Diet as tolerated. You may not have an appetite, but it is important to stay hydrated. Drink 64 ounces of water a day. Your appetite will return with time.  Shower per your regular routine daily.  Do not take hot showers.  Take warm showers that are less than 10 minutes. Rest and listen to your body, but do not remain in bed all day.Walk everyday for at least 15-20 minutes.  Deep cough and move around every 1-2 hours in the first few days after surgery. Do not pick at the dermabond glue on your incision sites.  This glue film will remain in place for 1-2 weeks and will start to peel off. Do not place lotions or balms on your incision unless instructed to specifically by Dr. Henreitta Leber. Do not lift > 10 lbs, perform excessive bending, pushing, pulling, squatting for 6-8 weeks after surgery. Where your abdominal binder with activity as much as possible. The activity restrictions and the abdominal binder are to prevent hernia formation at your incision while you are healing.   You are on chronic medication and just filled your tramadol in September for 180 pills. Take your tramadol as prescribed for incisional type pain. Also use tylenol and ibuprofen.    Pain Expectations and Narcotics: -After surgery you will have pain associated with your incisions and this is normal. The pain is muscular and nerve pain, and will get better with time. -You are encouraged and expected to take non narcotic medications like tylenol and ibuprofen (when able) to treat pain as multiple modalities can aid with pain treatment. -Narcotics are only used when pain is severe or there is breakthrough pain. -You are not expected to have a  pain score of 0 after surgery, as we cannot prevent pain. A pain score of 3-4 that allows you to be functional, move, walk, and tolerate some activity is the goal. The pain will continue to improve over the days after surgery and is dependent on your surgery. -Due to [place] law, we are only able to give a certain amount of pain medication to treat post operative pain, and we only give additional narcotics on a patient by patient basis.  -For most laparoscopic surgery, studies have shown that the majority of patients only need 10-15 narcotic pills, and for open surgeries most patients only need 15-20.   -Having appropriate expectations of pain and knowledge of pain management with non narcotics is important as we do not want anyone to become addicted to narcotic pain medication.  -Using ice packs in the first 48 hours and heating pads after 48 hours, wearing an abdominal binder (when recommended), and using over the counter medications are all ways to help with pain management.   -Simple acts like meditation and mindfulness practices after surgery can also help with pain control and research has proven the benefit of these practices.  Medication: Take tylenol and ibuprofen as needed for pain control, alternating every 4-6 hours.  Example:  Tylenol 1000mg  @ 6am, 12noon, 6pm, (Do not exceed 4000mg  of tylenol a day). Ibuprofen 800mg  @ 9am, 3pm, 9pm, 3am (Do not exceed 3600mg  of ibuprofen a day).  Take Tramadol for breakthrough pain as prescribed.  Take Colace for constipation  related to narcotic pain medication. If you do not have a bowel movement in 2 days, take Miralax over the counter.  Drink plenty of water to also prevent constipation.   Contact Information: If you have questions or concerns, please call our office, [phone], Monday- Thursday 8AM-5PM and Friday 8AM-12Noon.  If it is after hours or on the weekend, please call [facility] Main [facility], [phone], [phone], and ask to speak  to the surgeon on call for Dr. Henreitta Leber at [facility].

## 2021-08-01 NOTE — Transfer of Care (Signed)
Immediate Anesthesia Transfer of Care Note  Patient: Felicia [name]  Procedure(s) Performed: INCISIONAL HERNIA REPAIR TIMES TWO, ONE WITH VENTRALEX ST HERNIA MESH, THE SECOND BY PRIMARY CLOSURE (Abdomen)  Patient Location: PACU  Anesthesia Type:General  Level of Consciousness: awake, alert  and oriented  Airway & Oxygen Therapy: Patient Spontanous Breathing and Patient connected to nasal cannula oxygen  Post-op Assessment: Report given to RN and Post -op Vital signs reviewed and stable  Post vital signs: Reviewed and stable  Last Vitals:  Vitals Value Taken Time  BP 131/116 08/01/21 1043  Temp    Pulse 69 08/01/21 1044  Resp 11 08/01/21 1044  SpO2 95 % 08/01/21 1044  Vitals shown include unvalidated device data.  Last Pain:  Vitals:   08/01/21 0832  TempSrc: Oral  PainSc: 0-No pain      Patients Stated Pain Goal: 6 (08/01/21 0454)  Complications: No notable events documented.

## 2021-08-01 NOTE — Anesthesia Preprocedure Evaluation (Signed)
Anesthesia Evaluation  Patient identified by MRN, date of birth, ID band Patient awake    Reviewed: Allergy & Precautions, H&P , NPO status , Patient's Chart, lab work & pertinent test results, reviewed documented beta blocker date and time   Airway Mallampati: II  TM Distance: >3 FB Neck ROM: full    Dental no notable dental hx. (+) Partial Upper   Pulmonary shortness of breath, asthma , Current Smoker,    Pulmonary exam normal breath sounds clear to auscultation       Cardiovascular Exercise Tolerance: Good + Peripheral Vascular Disease   Rhythm:regular Rate:Normal     Neuro/Psych  Headaches,  Neuromuscular disease negative psych ROS   GI/Hepatic Neg liver ROS, PUD,   Endo/Other  negative endocrine ROS  Renal/GU negative Renal ROS  negative genitourinary   Musculoskeletal   Abdominal   Peds  Hematology  (+) Blood dyscrasia, anemia ,   Anesthesia Other Findings 1. Hyperdynamic LV with systolic cavity obliteration creates dynavmic  cavitary gradient, peak 62 mmHg. Marland Kitchen Left ventricular ejection fraction, by  estimation, is >75%. The left ventricle has hyperdynamic function. The  left ventricle has no regional wall  motion abnormalities. Left ventricular diastolic parameters are consistent  with Grade I diastolic dysfunction (impaired relaxation).  2. Right ventricular systolic function is normal. The right ventricular  size is normal.  3. The mitral valve is normal in structure. No evidence of mitral valve  regurgitation. No evidence of mitral stenosis.  4. The aortic valve was not well visualized. Aortic valve regurgitation  is trivial. No aortic stenosis is present.  5. The inferior vena cava is normal in size with greater than 50%  respiratory variability, suggesting right atrial pressure of 3 mmHg.  Reproductive/Obstetrics negative OB ROS                             Anesthesia  Physical Anesthesia Plan  ASA: 3  Anesthesia Plan: General   Post-op Pain Management:    Induction:   PONV Risk Score and Plan: Ondansetron  Airway Management Planned:   Additional Equipment:   Intra-op Plan:   Post-operative Plan:   Informed Consent: I have reviewed the patients History and Physical, chart, labs and discussed the procedure including the risks, benefits and alternatives for the proposed anesthesia with the patient or authorized representative who has indicated his/her understanding and acceptance.     Dental Advisory Given  Plan Discussed with: CRNA  Anesthesia Plan Comments:         Anesthesia Quick Evaluation

## 2021-08-04 ENCOUNTER — Encounter (HOSPITAL_COMMUNITY): Payer: Self-pay | Admitting: General Surgery

## 2021-08-07 ENCOUNTER — Other Ambulatory Visit: Payer: Self-pay | Admitting: General Surgery

## 2021-08-26 ENCOUNTER — Other Ambulatory Visit: Payer: Self-pay | Admitting: Family Medicine

## 2021-08-26 DIAGNOSIS — M5431 Sciatica, right side: Secondary | ICD-10-CM

## 2021-08-26 NOTE — Telephone Encounter (Signed)
Ok to refill??  Last office visit 12/20/2020.  Last refill 04/07/2021, #3 refills.

## 2021-08-28 ENCOUNTER — Encounter: Payer: Self-pay | Admitting: Emergency Medicine

## 2021-08-28 ENCOUNTER — Other Ambulatory Visit: Payer: Self-pay

## 2021-08-28 ENCOUNTER — Ambulatory Visit
Admission: EM | Admit: 2021-08-28 | Discharge: 2021-08-28 | Disposition: A | Discharge status: Home / Self Care | Payer: Medicare PPO | Attending: Internal Medicine | Admitting: Internal Medicine

## 2021-08-28 ENCOUNTER — Encounter: Payer: Medicare PPO | Admitting: General Surgery

## 2021-08-28 DIAGNOSIS — M545 Low back pain, unspecified: Secondary | ICD-10-CM | POA: Diagnosis not present

## 2021-08-28 MED ORDER — IBUPROFEN 600 MG PO TABS: 600.0000 mg | ORAL_TABLET | Freq: Four times a day (QID) | ORAL | 0 refills | Status: DC | PRN

## 2021-08-28 MED ORDER — KETOROLAC TROMETHAMINE 30 MG/ML IJ SOLN
15.0000 mg | Freq: Once | INTRAMUSCULAR | Status: AC
  Administered 2021-08-28: 15 mg via INTRAMUSCULAR
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
## 2021-08-28 NOTE — Discharge Instructions (Addendum)
Gentle range of motion exercises Take medications as prescribed Avoid driving or operating heavy machinery after taking muscle relaxants. Warm compress over your back within 20 minutes on-20 minutes off cycle will help with the pain and muscle stiffness Return to urgent care if symptoms worsen.
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
## 2021-09-04 ENCOUNTER — Other Ambulatory Visit: Payer: Self-pay

## 2021-09-04 ENCOUNTER — Ambulatory Visit (INDEPENDENT_AMBULATORY_CARE_PROVIDER_SITE_OTHER): Payer: Medicare PPO | Admitting: General Surgery

## 2021-09-04 ENCOUNTER — Encounter: Payer: Self-pay | Admitting: General Surgery

## 2021-09-04 VITALS — BP 143/74 | HR 86 | Temp 98.3°F | Resp 16 | Ht <= 58 in | Wt 129.0 lb

## 2021-09-04 DIAGNOSIS — K432 Incisional hernia without obstruction or gangrene: Secondary | ICD-10-CM

## 2021-09-04 NOTE — Patient Instructions (Signed)
No heavy lifting > 10 lbs, excessive bending, pushing, pulling, or squatting for 6-8 weeks after surgery.  You can slowly start lifting more and then back to normal at 8 weeks.

## 2021-09-05 ENCOUNTER — Ambulatory Visit: Payer: Medicare PPO | Admitting: Family Medicine

## 2021-09-05 NOTE — Progress Notes (Signed)
[facility]  Hernia sites are healing. No erythema or drainage.  No pain complaints.   BP (!) 143/74   Pulse 86   Temp 98.3 F (36.8 C) (Other (Comment))   Resp 16   Ht 4\' 9"  (1.448 m)   Wt 129 lb (58.5 kg)   LMP 12/26/2012   SpO2 92%   BMI 27.92 kg/m   No heavy lifting > 10 lbs, excessive bending, pushing, pulling, or squatting for 6-8 weeks after surgery.   You can slowly start lifting more and then back to normal at 8 weeks.  02/23/2013, MD [facility] [address] [place], [place] [phone] (office)

## 2021-09-08 ENCOUNTER — Other Ambulatory Visit: Payer: Self-pay

## 2021-09-08 ENCOUNTER — Encounter: Payer: Self-pay | Admitting: Family Medicine

## 2021-09-08 ENCOUNTER — Ambulatory Visit (INDEPENDENT_AMBULATORY_CARE_PROVIDER_SITE_OTHER): Payer: Medicare PPO | Admitting: Family Medicine

## 2021-09-08 VITALS — BP 138/84 | HR 88 | Temp 97.8°F | Resp 16 | Ht <= 58 in | Wt 130.0 lb

## 2021-09-08 DIAGNOSIS — M5441 Lumbago with sciatica, right side: Secondary | ICD-10-CM

## 2021-09-08 MED ORDER — PREDNISONE 20 MG PO TABS: ORAL_TABLET | ORAL | 0 refills | Status: DC
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
## 2021-09-10 ENCOUNTER — Other Ambulatory Visit: Payer: Self-pay

## 2021-09-10 ENCOUNTER — Ambulatory Visit (HOSPITAL_COMMUNITY)
Admission: RE | Admit: 2021-09-10 | Discharge: 2021-09-10 | Disposition: A | Discharge status: Home / Self Care | Payer: Medicare PPO | Source: Ambulatory Visit | Attending: Family Medicine | Admitting: Family Medicine

## 2021-09-10 DIAGNOSIS — M5441 Lumbago with sciatica, right side: Secondary | ICD-10-CM

## 2021-09-10 DIAGNOSIS — M545 Low back pain, unspecified: Secondary | ICD-10-CM | POA: Diagnosis not present

## 2021-09-19 ENCOUNTER — Other Ambulatory Visit: Payer: Self-pay

## 2021-09-19 ENCOUNTER — Ambulatory Visit (INDEPENDENT_AMBULATORY_CARE_PROVIDER_SITE_OTHER): Payer: Medicare PPO | Admitting: Family Medicine

## 2021-09-19 ENCOUNTER — Encounter: Payer: Self-pay | Admitting: Family Medicine

## 2021-09-19 VITALS — BP 130/84 | HR 93 | Temp 97.8°F | Wt 130.6 lb

## 2021-09-19 DIAGNOSIS — I739 Peripheral vascular disease, unspecified: Secondary | ICD-10-CM | POA: Diagnosis not present

## 2021-09-19 DIAGNOSIS — R7303 Prediabetes: Secondary | ICD-10-CM | POA: Diagnosis not present

## 2021-09-19 DIAGNOSIS — Z0000 Encounter for general adult medical examination without abnormal findings: Secondary | ICD-10-CM

## 2021-09-19 DIAGNOSIS — Z23 Encounter for immunization: Secondary | ICD-10-CM

## 2021-09-19 DIAGNOSIS — E7800 Pure hypercholesterolemia, unspecified: Secondary | ICD-10-CM | POA: Diagnosis not present

## 2021-09-19 DIAGNOSIS — Z124 Encounter for screening for malignant neoplasm of cervix: Secondary | ICD-10-CM

## 2021-09-19 DIAGNOSIS — I10 Essential (primary) hypertension: Secondary | ICD-10-CM

## 2021-09-19 DIAGNOSIS — Z1231 Encounter for screening mammogram for malignant neoplasm of breast: Secondary | ICD-10-CM | POA: Diagnosis not present
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
## 2021-09-20 LAB — COMPLETE METABOLIC PANEL WITH GFR
AG Ratio: 1.7 (calc) (ref 1.0–2.5)
ALT: 13 U/L (ref 6–29)
AST: 13 U/L (ref 10–35)
Albumin: 3.8 g/dL (ref 3.6–5.1)
Alkaline phosphatase (APISO): 73 U/L (ref 37–153)
BUN: 19 mg/dL (ref 7–25)
CO2: 31 mmol/L (ref 20–32)
Calcium: 9.3 mg/dL (ref 8.6–10.4)
Chloride: 103 mmol/L (ref 98–110)
Creat: 0.96 mg/dL (ref 0.50–1.03)
Globulin: 2.3 g/dL (calc) (ref 1.9–3.7)
Glucose, Bld: 121 mg/dL — ABNORMAL HIGH (ref 65–99)
Potassium: 4 mmol/L (ref 3.5–5.3)
Sodium: 142 mmol/L (ref 135–146)
Total Bilirubin: 0.4 mg/dL (ref 0.2–1.2)
Total Protein: 6.1 g/dL (ref 6.1–8.1)
eGFR: 69 mL/min/{1.73_m2} (ref >=60)

## 2021-09-20 LAB — LIPID PANEL
Cholesterol: 193 mg/dL (ref <200)
HDL: 35 mg/dL — ABNORMAL LOW (ref >=50)
LDL Cholesterol (Calc): 125 mg/dL (calc) — ABNORMAL HIGH
Non-HDL Cholesterol (Calc): 158 mg/dL (calc) — ABNORMAL HIGH (ref <130)
Total CHOL/HDL Ratio: 5.5 (calc) — ABNORMAL HIGH (ref <5.0)
Triglycerides: 210 mg/dL — ABNORMAL HIGH (ref <150)

## 2021-09-20 LAB — HEMOGLOBIN A1C
Hgb A1c MFr Bld: 5.6 % of total Hgb (ref <5.7)
Mean Plasma Glucose: 114 mg/dL
eAG (mmol/L): 6.3 mmol/L

## 2021-09-22 ENCOUNTER — Telehealth: Payer: Self-pay

## 2021-09-22 NOTE — Telephone Encounter (Signed)
Pt called in stating that she may have had a allergic reaction to her flu shot that she received in office last week. Pt states that shot area is red and warm to the touch and has developed a rash around arm. Pt states that if rash and redness have not gotten any better by Wed (11/2) pt will schedule an office visit to be seen.  Cb#: [phone]

## 2021-09-22 NOTE — Telephone Encounter (Signed)
Noted  

## 2021-09-24 LAB — PAP, TP IMAGING W/ HPV RNA, RFLX HPV TYPE 16,18/45: HPV DNA High Risk: NOT DETECTED

## 2021-10-02 ENCOUNTER — Telehealth: Payer: Self-pay | Admitting: Family Medicine

## 2021-10-02 MED ORDER — POTASSIUM CHLORIDE CRYS ER 20 MEQ PO TBCR: 40.0000 meq | EXTENDED_RELEASE_TABLET | Freq: Two times a day (BID) | ORAL | 0 refills | Status: DC

## 2021-10-02 MED ORDER — EZETIMIBE 10 MG PO TABS: 10.0000 mg | ORAL_TABLET | Freq: Every day | ORAL | 3 refills | Status: DC

## 2021-10-02 NOTE — Telephone Encounter (Signed)
Patient left voicemail message to request results of recent labs for A1C and potassium; awaiting refill for potassium medication pending results (patient unsure of name). Please advise at [phone]

## 2021-10-02 NOTE — Telephone Encounter (Signed)
Call placed to patient and patient made aware.   Prescription sent to pharmacy.  

## 2021-10-14 ENCOUNTER — Other Ambulatory Visit: Payer: Self-pay | Admitting: Orthopedic Surgery

## 2021-10-14 DIAGNOSIS — M259 Joint disorder, unspecified: Secondary | ICD-10-CM | POA: Diagnosis not present

## 2021-10-14 DIAGNOSIS — M533 Sacrococcygeal disorders, not elsewhere classified: Secondary | ICD-10-CM

## 2021-10-14 DIAGNOSIS — M5451 Vertebrogenic low back pain: Secondary | ICD-10-CM | POA: Diagnosis not present

## 2021-10-24 ENCOUNTER — Other Ambulatory Visit: Payer: Self-pay | Admitting: Family Medicine

## 2021-10-27 ENCOUNTER — Other Ambulatory Visit: Payer: Self-pay

## 2021-10-27 ENCOUNTER — Telehealth: Payer: Self-pay

## 2021-10-27 ENCOUNTER — Other Ambulatory Visit: Payer: Self-pay | Admitting: Family Medicine

## 2021-10-27 MED ORDER — CLONAZEPAM 1 MG PO TABS: 1.0000 mg | ORAL_TABLET | Freq: Three times a day (TID) | ORAL | 3 refills | Status: DC

## 2021-10-27 MED ORDER — POTASSIUM CHLORIDE CRYS ER 20 MEQ PO TBCR: 40.0000 meq | EXTENDED_RELEASE_TABLET | Freq: Two times a day (BID) | ORAL | 0 refills | Status: DC

## 2021-10-27 NOTE — Telephone Encounter (Signed)
Pt called in requesting a refill of clonazePAM (KLONOPIN) 1 MG. Pt states that she is completely out of this med and having trouble sleeping.   Cb#: [phone]

## 2021-11-05 ENCOUNTER — Telehealth: Payer: Self-pay | Admitting: Family Medicine

## 2021-11-05 NOTE — Telephone Encounter (Signed)
Left message for patient to call back and schedule Medicare Annual Wellness Visit (AWV) in office.  ° °If not able to come in office, please offer to do virtually or by telephone.  Left office number and my jabber #[phone]. ° °Due for AWVI ° °Please schedule at anytime with Nurse Health Advisor. °  °

## 2021-11-20 ENCOUNTER — Telehealth: Payer: Self-pay

## 2021-11-20 ENCOUNTER — Other Ambulatory Visit: Payer: Self-pay | Admitting: Family Medicine

## 2021-11-20 DIAGNOSIS — M5431 Sciatica, right side: Secondary | ICD-10-CM

## 2021-11-20 MED ORDER — CYCLOBENZAPRINE HCL 10 MG PO TABS: ORAL_TABLET | ORAL | 1 refills | Status: DC

## 2021-11-20 NOTE — Telephone Encounter (Signed)
Cyclobenzabrine refill request from [facility].  Patient last seen 09/19/2021.  Last filled 08/26/21.

## 2021-11-25 ENCOUNTER — Other Ambulatory Visit: Payer: Self-pay

## 2021-11-25 MED ORDER — HYDROCHLOROTHIAZIDE 25 MG PO TABS: 25.0000 mg | ORAL_TABLET | Freq: Every day | ORAL | 3 refills | Status: DC

## 2021-12-04 ENCOUNTER — Other Ambulatory Visit: Payer: Self-pay

## 2021-12-04 ENCOUNTER — Telehealth: Payer: Medicare PPO | Admitting: Nurse Practitioner

## 2021-12-04 ENCOUNTER — Ambulatory Visit: Payer: Medicare PPO

## 2021-12-12 ENCOUNTER — Ambulatory Visit (INDEPENDENT_AMBULATORY_CARE_PROVIDER_SITE_OTHER): Payer: Medicare PPO

## 2021-12-12 ENCOUNTER — Telehealth: Payer: Self-pay

## 2021-12-12 ENCOUNTER — Other Ambulatory Visit: Payer: Self-pay

## 2021-12-12 VITALS — Ht <= 58 in | Wt 130.0 lb

## 2021-12-12 DIAGNOSIS — Z87891 Personal history of nicotine dependence: Secondary | ICD-10-CM

## 2021-12-12 DIAGNOSIS — F17200 Nicotine dependence, unspecified, uncomplicated: Secondary | ICD-10-CM

## 2021-12-12 DIAGNOSIS — Z0000 Encounter for general adult medical examination without abnormal findings: Secondary | ICD-10-CM | POA: Diagnosis not present

## 2021-12-12 NOTE — Telephone Encounter (Signed)
Yes, she needs an appointment and I have availability this afternoon please schedule

## 2021-12-12 NOTE — Patient Instructions (Signed)
Felicia [name] , Thank you for taking time to come for your Medicare Wellness Visit. I appreciate your ongoing commitment to your health goals. Please review the following plan we discussed and let me know if I can assist you in the future.   Screening recommendations/referrals: Colonoscopy: Done 05/14/2016 Repeat in 10 years  Mammogram: Ordered for April 2023. Please call office if you do not receive a call to set up. Bone Density: Not due until age 58.  Recommended yearly ophthalmology/optometry visit for glaucoma screening and checkup Recommended yearly dental visit for hygiene and checkup  Vaccinations: Influenza vaccine: Done 09/19/2021 Pneumococcal vaccine: Done 01/11/2018 and 09/19/2021. Tdap vaccine: Due. Repeat in 10 years  Shingles vaccine: Shingrix discussed. Please contact your pharmacy for coverage information.    Covid-19: Done 07/22/2020.  Advanced directives: Advance directive discussed with you today. Even though you declined this today, please call our office should you change your mind, and we can give you the proper paperwork for you to fill out.   Conditions/risks identified: Aim for 30 minutes of exercise or brisk walking each day, drink 6-8 glasses of water and eat lots of fruits and vegetables. KEEP UP THE GOOD WORK!!  Next appointment: Follow up in one year for your annual wellness visit. 2024  Preventive Care 40-64 Years, Female Preventive care refers to lifestyle choices and visits with your health care provider that can promote health and wellness. What does preventive care include? A yearly physical exam. This is also called an annual well check. Dental exams once or twice a year. Routine eye exams. Ask your health care provider how often you should have your eyes checked. Personal lifestyle choices, including: Daily care of your teeth and gums. Regular physical activity. Eating a healthy diet. Avoiding tobacco and drug use. Limiting alcohol  use. Practicing safe sex. Taking low-dose aspirin daily starting at age 71. Taking vitamin and mineral supplements as recommended by your health care provider. What happens during an annual well check? The services and screenings done by your health care provider during your annual well check will depend on your age, overall health, lifestyle risk factors, and family history of disease. Counseling  Your health care provider may ask you questions about your: Alcohol use. Tobacco use. Drug use. Emotional well-being. Home and relationship well-being. Sexual activity. Eating habits. Work and work Statistician. Method of birth control. Menstrual cycle. Pregnancy history. Screening  You may have the following tests or measurements: Height, weight, and BMI. Blood pressure. Lipid and cholesterol levels. These may be checked every 5 years, or more frequently if you are over 81 years old. Skin check. Lung cancer screening. You may have this screening every year starting at age 63 if you have a 30-pack-year history of smoking and currently smoke or have quit within the past 15 years. Fecal occult blood test (FOBT) of the stool. You may have this test every year starting at age 47. Flexible sigmoidoscopy or colonoscopy. You may have a sigmoidoscopy every 5 years or a colonoscopy every 10 years starting at age 58. Hepatitis C blood test. Hepatitis B blood test. Sexually transmitted disease (STD) testing. Diabetes screening. This is done by checking your blood sugar (glucose) after you have not eaten for a while (fasting). You may have this done every 1-3 years. Mammogram. This may be done every 1-2 years. Talk to your health care provider about when you should start having regular mammograms. This may depend on whether you have a family history of breast cancer. BRCA-related  cancer screening. This may be done if you have a family history of breast, ovarian, tubal, or peritoneal cancers. Pelvic  exam and Pap test. This may be done every 3 years starting at age 21. Starting at age 75, this may be done every 5 years if you have a Pap test in combination with an HPV test. Bone density scan. This is done to screen for osteoporosis. You may have this scan if you are at high risk for osteoporosis. Discuss your test results, treatment options, and if necessary, the need for more tests with your health care provider. Vaccines  Your health care provider may recommend certain vaccines, such as: Influenza vaccine. This is recommended every year. Tetanus, diphtheria, and acellular pertussis (Tdap, Td) vaccine. You may need a Td booster every 10 years. Zoster vaccine. You may need this after age 6. Pneumococcal 13-valent conjugate (PCV13) vaccine. You may need this if you have certain conditions and were not previously vaccinated. Pneumococcal polysaccharide (PPSV23) vaccine. You may need one or two doses if you smoke cigarettes or if you have certain conditions. Talk to your health care provider about which screenings and vaccines you need and how often you need them. This information is not intended to replace advice given to you by your health care provider. Make sure you discuss any questions you have with your health care provider. Document Released: 12/06/2015 Document Revised: 07/29/2016 Document Reviewed: 09/10/2015 Elsevier Interactive Patient Education  2017 [place] Prevention in the Home Falls can cause injuries. They can happen to people of all ages. There are many things you can do to make your home safe and to help prevent falls. What can I do on the outside of my home? Regularly fix the edges of walkways and driveways and fix any cracks. Remove anything that might make you trip as you walk through a door, such as a raised step or threshold. Trim any bushes or trees on the path to your home. Use bright outdoor lighting. Clear any walking paths of anything that might  make someone trip, such as rocks or tools. Regularly check to see if handrails are loose or broken. Make sure that both sides of any steps have handrails. Any raised decks and porches should have guardrails on the edges. Have any leaves, snow, or ice cleared regularly. Use sand or salt on walking paths during winter. Clean up any spills in your garage right away. This includes oil or grease spills. What can I do in the bathroom? Use night lights. Install grab bars by the toilet and in the tub and shower. Do not use towel bars as grab bars. Use non-skid mats or decals in the tub or shower. If you need to sit down in the shower, use a plastic, non-slip stool. Keep the floor dry. Clean up any water that spills on the floor as soon as it happens. Remove soap buildup in the tub or shower regularly. Attach bath mats securely with double-sided non-slip rug tape. Do not have throw rugs and other things on the floor that can make you trip. What can I do in the bedroom? Use night lights. Make sure that you have a light by your bed that is easy to reach. Do not use any sheets or blankets that are too big for your bed. They should not hang down onto the floor. Have a firm chair that has side arms. You can use this for support while you get dressed. Do not have throw  rugs and other things on the floor that can make you trip. What can I do in the kitchen? Clean up any spills right away. Avoid walking on wet floors. Keep items that you use a lot in easy-to-reach places. If you need to reach something above you, use a strong step stool that has a grab bar. Keep electrical cords out of the way. Do not use floor polish or wax that makes floors slippery. If you must use wax, use non-skid floor wax. Do not have throw rugs and other things on the floor that can make you trip. What can I do with my stairs? Do not leave any items on the stairs. Make sure that there are handrails on both sides of the stairs  and use them. Fix handrails that are broken or loose. Make sure that handrails are as long as the stairways. Check any carpeting to make sure that it is firmly attached to the stairs. Fix any carpet that is loose or worn. Avoid having throw rugs at the top or bottom of the stairs. If you do have throw rugs, attach them to the floor with carpet tape. Make sure that you have a light switch at the top of the stairs and the bottom of the stairs. If you do not have them, ask someone to add them for you. What else can I do to help prevent falls? Wear shoes that: Do not have high heels. Have rubber bottoms. Are comfortable and fit you well. Are closed at the toe. Do not wear sandals. If you use a stepladder: Make sure that it is fully opened. Do not climb a closed stepladder. Make sure that both sides of the stepladder are locked into place. Ask someone to hold it for you, if possible. Clearly mark and make sure that you can see: Any grab bars or handrails. First and last steps. Where the edge of each step is. Use tools that help you move around (mobility aids) if they are needed. These include: Canes. Walkers. Scooters. Crutches. Turn on the lights when you go into a dark area. Replace any light bulbs as soon as they burn out. Set up your furniture so you have a clear path. Avoid moving your furniture around. If any of your floors are uneven, fix them. If there are any pets around you, be aware of where they are. Review your medicines with your doctor. Some medicines can make you feel dizzy. This can increase your chance of falling. Ask your doctor what other things that you can do to help prevent falls. This information is not intended to replace advice given to you by your health care provider. Make sure you discuss any questions you have with your health care provider. Document Released: 09/05/2009 Document Revised: 04/16/2016 Document Reviewed: 12/14/2014 Elsevier Interactive Patient  Education  2017 [employer].

## 2021-12-12 NOTE — Progress Notes (Signed)
Subjective:   Felicia [name] is a 56 y.o. female who presents for an Initial Medicare Annual Wellness Visit. Virtual Visit via Telephone Note  I connected with  Felicia [name] on 12/12/21 at 12:00 PM EST by telephone and verified that I am speaking with the correct person using two identifiers.  Location: Patient: HOME Provider: [facility] Persons participating in the virtual visit: patient/Nurse Health Advisor   I discussed the limitations, risks, security and privacy concerns of performing an evaluation and management service by telephone and the availability of in person appointments. The patient expressed understanding and agreed to proceed.  Interactive audio and video telecommunications were attempted between this nurse and patient, however failed, due to patient having technical difficulties OR patient did not have access to video capability.  We continued and completed visit with audio only.  Some vital signs may be absent or patient reported.   Felicia Dash, LPN  Review of Systems     Cardiac Risk Factors include: advanced age (>9men, >34 women);dyslipidemia;sedentary lifestyle  PHONE VISIT. PT AT HOME. NURSE AT [facility].    Objective:    Today's Vitals   12/12/21 1205 12/12/21 1209  Weight: 130 lb (59 kg)   Height: 4\' 9"  (1.448 m)   PainSc:  7    Body mass index is 28.13 kg/m.  Advanced Directives 12/12/2021 08/01/2021 07/31/2021 12/08/2018 06/23/2017 04/29/2016 12/25/2015  Does Patient Have a Medical Advance Directive? No No No No No No No  Would patient like information on creating a medical advance directive? No - Patient declined No - Patient declined No - Patient declined No - Patient declined No - Patient declined - Yes - Educational materials given  Pre-existing out of facility DNR order (yellow form or pink MOST form) - - - - - - -    Current Medications (verified) Outpatient Encounter Medications as of 12/12/2021  Medication Sig   albuterol (VENTOLIN HFA) 108  (90 Base) MCG/ACT inhaler INHALE 2 PUFFS INTO THE LUNGS EVERY 6 HOURS AS NEEDED FOR WHEEZE/SHORTNES OF BREATH   aspirin EC 325 MG tablet Take 325 mg by mouth daily.   atorvastatin (LIPITOR) 80 MG tablet TAKE 1 TABLET BY MOUTH EVERY DAY   clonazePAM (KLONOPIN) 1 MG tablet Take 1 tablet (1 mg total) by mouth 3 (three) times daily.   cyclobenzaprine (FLEXERIL) 10 MG tablet TAKE 1 TABLET BY MOUTH THREE TIMES A DAY AS NEEDED FOR MUSCLE SPASMS   dicyclomine (BENTYL) 20 MG tablet TAKE 1 TABLET (20 MG TOTAL) BY MOUTH 3 (THREE) TIMES DAILY WITH MEALS AS NEEDED FOR SPASMS.   diphenhydrAMINE (BENADRYL) 25 MG tablet Take 50 mg by mouth every 6 (six) hours as needed for allergies.   EPIPEN 2-PAK 0.3 MG/0.3ML SOAJ injection USE AS DIRECTED (Patient taking differently: Inject 0.3 mg into the muscle as needed for anaphylaxis.)   ezetimibe (ZETIA) 10 MG tablet Take 1 tablet (10 mg total) by mouth daily.   hydrochlorothiazide (HYDRODIURIL) 25 MG tablet Take 1 tablet (25 mg total) by mouth daily.   meloxicam (MOBIC) 15 MG tablet meloxicam 15 mg tablet  TAKE 1 TABLET (15 MG TOTAL) BY MOUTH DAILY.   metoprolol succinate (TOPROL-XL) 50 MG 24 hr tablet Take 1 tablet (50 mg total) by mouth daily. Take with or immediately following a meal.   ondansetron (ZOFRAN) 4 MG tablet Take 1 tablet (4 mg total) by mouth every 8 (eight) hours as needed for nausea or vomiting.   potassium chloride [name] (KLOR-CON M)  20 MEQ tablet Take 2 tablets (40 mEq total) by mouth 2 (two) times daily.   predniSONE (DELTASONE) 20 MG tablet 3 tabs poqday 1-2, 2 tabs poqday 3-4, 1 tab poqday 5-6   traMADol (ULTRAM) 50 MG tablet TAKE 2 TABLETS BY MOUTH EVERY 8 HOURS AS NEEDED   No facility-administered encounter medications on file as of 12/12/2021.    Allergies (verified) Bee pollen, Bactrim [sulfamethoxazole-trimethoprim], Cephalosporins, Doxycycline, Tetracyclines & related, Other, Augmentin [amoxicillin-pot clavulanate], Codeine, and Reflex pain  relief [menthol]   History: Past Medical History:  Diagnosis Date   Allergy    year around   Anemia    Anxiety    Arthritis    Asthma    Atrial fibrillation (HCC)    Bipolar 1 disorder (HCC)    Borderline diabetes    Depression    Early cataract    right   Ectopic pregnancy    Fatty liver    Headache    Heart murmur    Hyperlipidemia    Insomnia    Peripheral arterial disease (HCC)    Pneumonia    PUD (peptic ulcer disease)    Right knee pain 12-05-2015   per pt.  she fell out of her husband's truck and landed on her right knee   Shortness of breath dyspnea    Wears glasses    Wears partial dentures    Past Surgical History:  Procedure Laterality Date   ABDOMINAL AORTAGRAM N/A 09/12/2012   Procedure: ABDOMINAL Ronny FlurryAORTAGRAM;  Surgeon: Fransisco HertzBrian L Chen, MD;  [facility];  Service: Cardiovascular;  Laterality: N/A;   AORTA - BILATERAL FEMORAL ARTERY BYPASS GRAFT N/A 10/23/2014   Procedure: AORTOBIFEMORAL BYPASS GRAFT;  Surgeon: Chuck Hinthristopher S Dickson, MD;  [facility];  Service: Vascular;  Laterality: N/A;   BACK SURGERY     CESAREAN SECTION     COLONOSCOPY  01-14-2004   > 11 yr ago Dr Loreta AveMann - normal - report in epic    DILATATION & CURETTAGE/HYSTEROSCOPY WITH MYOSURE N/A 10/15/2015   Procedure: DILATATION & CURETTAGE/HYSTEROSCOPY WITH MYOSURE;  Surgeon: Ok EdwardsJuan H Fernandez, MD;  Location: [facility] ORS;  Service: Gynecology;  Laterality: N/A;   ESOPHAGOGASTRODUODENOSCOPY  01-14-2004   gastritis- dr Loreta Avemann- in epic already    [facility]   Procedure: INCISIONAL HERNIA REPAIR TIMES TWO, ONE WITH VENTRALEX ST HERNIA MESH, THE SECOND BY PRIMARY CLOSURE;  Surgeon: Lucretia RoersBridges, Lindsay C, MD;  Location: [facility] ORS;  Service: General;  Laterality: N/A;   MULTIPLE TOOTH EXTRACTIONS     NOSE SURGERY     NOSE SURGERY     SHOULDER ARTHROSCOPY WITH SUBACROMIAL DECOMPRESSION, ROTATOR CUFF REPAIR AND BICEP TENDON REPAIR Left 09/26/2019   Procedure: left shoulder  arthroscopy, biceps release and tenodesis, mini open rotator cuff tear repair subscapularis and supraspinatus;  Surgeon: Cammy Copaean, Gregory Scott, MD;  Location: [facility] OR;  Service: Orthopedics;  Laterality: Left;   SHOULDER SURGERY     rotator cuff repair    TONSILLECTOMY     TUBAL LIGATION     UPPER GASTROINTESTINAL ENDOSCOPY  01-14-2004   in epic- dr Loreta Avemann- gastritis    Family History  Adopted: Yes  Problem Relation Age of Onset   Heart disease Mother    Diabetes Mother    Hyperlipidemia Mother    Hypertension Mother    Heart attack Mother    Heart disease Father    Diabetes Father    Heart attack Father    Hypertension Father  Bladder Cancer Father    Diabetes Brother    Lung cancer Paternal Grandfather    Colon cancer Neg Hx    Colon polyps Neg Hx    Rectal cancer Neg Hx    Stomach cancer Neg Hx    Social History   Socioeconomic History   Marital status: Married    Spouse name: Loraine Leriche   Number of children: 2   Years of education: Not on file   Highest education level: Not on file  Occupational History   Not on file  Tobacco Use   Smoking status: Every Day    Packs/day: 0.50    Years: 30.00    Pack years: 15.00    Types: Cigarettes    Start date: 01/29/1981   Smokeless tobacco: Never  Vaping Use   Vaping Use: Never used  Substance and Sexual Activity   Alcohol use: Yes    Alcohol/week: 1.0 standard drink    Types: 1 Shots of liquor per week    Comment: very occassionally on social occassions   Drug use: No   Sexual activity: Yes  Other Topics Concern   Not on file  Social History Narrative   Married 38 years in 2022.   2 daughters   4 grandsons, 1 granddaughter   Social Determinants of Corporate investment banker Strain: Low Risk    Difficulty of Paying Living Expenses: Not very hard  Food Insecurity: No Food Insecurity   Worried About Programme researcher, broadcasting/film/video in the Last Year: Never true   Barista in the Last Year: Never true  Transportation Needs: No  Transportation Needs   Lack of Transportation (Medical): No   Lack of Transportation (Non-Medical): No  Physical Activity: Insufficiently Active   Days of Exercise per Week: 5 days   Minutes of Exercise per Session: 20 min  Stress: No Stress Concern Present   Feeling of Stress : Only a little  Social Connections: Moderately Integrated   Frequency of Communication with Friends and Family: More than three times a week   Frequency of Social Gatherings with Friends and Family: More than three times a week   Attends Religious Services: 1 to 4 times per year   Active Member of [employer] or Organizations: No   Attends Engineer, structural: Never   Marital Status: Married    Tobacco Counseling Ready to quit: Not Answered Counseling given: Not Answered   Clinical Intake:  Pre-visit preparation completed: Yes  Pain : 0-10 Pain Score: 7  Pain Type: Chronic pain Pain Location: Back Pain Descriptors / Indicators: Aching Pain Onset: More than a month ago Pain Frequency: Intermittent     BMI - recorded: 28.13 Nutritional Status: BMI 25 -29 Overweight Nutritional Risks: None Diabetes: No  How often do you need to have someone help you when you read instructions, pamphlets, or other written materials from your doctor or pharmacy?: 1 - Never  Diabetic?NO  Interpreter Needed?: No  Information entered by :: Felicia Rashaun Curl, LPN   Activities of Daily Living In your present state of health, do you have any difficulty performing the following activities: 12/12/2021 07/31/2021  Hearing? Y N  Comment R ear -  Vision? N -  Difficulty concentrating or making decisions? N N  Comment Memory at times. -  Walking or climbing stairs? N N  Dressing or bathing? N N  Doing errands, shopping? N N  Preparing Food and eating ? N -  Using the Toilet? N -  In the past six months, have you accidently leaked urine? Y -  Comment at times. -  Do you have problems with loss of bowel control? Y -   Comment at times -  Managing your Medications? N -  Managing your Finances? N -  Housekeeping or managing your Housekeeping? N -  Some recent data might be hidden    Patient Care Team: Donita Brooks, MD as PCP - General (Family Medicine) Wyline Mood, Dorothe Pea, MD as PCP - Cardiology (Cardiology) Donita Brooks, MD (Family Medicine) Dannielle Huh, MD (Orthopedic Surgery) Dannielle Huh, MD (Orthopedic Surgery)  Indicate any recent Medical Services you may have received from other than [facility] providers in the past year (date may be approximate).     Assessment:   This is a routine wellness examination for Felicia [name].  Hearing/Vision screen Hearing Screening - Comments:: Hearing issues to R ear.  Vision Screening - Comments:: Glasses. Dr. Harriette Bouillon. 2022.   Dietary issues and exercise activities discussed: Current Exercise Habits: Home exercise routine, Type of exercise: walking, Time (Minutes): 20, Frequency (Times/Week): 5, Weekly Exercise (Minutes/Week): 100, Intensity: Mild, Exercise limited by: cardiac condition(s);respiratory conditions(s)   Goals Addressed             This Visit's Progress    DIET - REDUCE CALORIE INTAKE       Weight loss.       Depression Screen PHQ 2/9 Scores 12/12/2021 05/11/2019 02/02/2019 08/25/2018 01/11/2018 12/27/2017 11/28/2014  PHQ - 2 Score 1 0 5 6 0 1 0  PHQ- 9 Score - - 14 10 - 3 -    Fall Risk Fall Risk  12/12/2021 07/08/2021 01/23/2021 12/20/2020 05/11/2019  Falls in the past year? 1 0 0 0 1  Number falls in past yr: 1 - - 0 0  Injury with Fall? 1 - - 0 0  Risk for fall due to : History of fall(s);Impaired balance/gait - - No Fall Risks -  Follow up Falls prevention discussed Falls evaluation completed Falls evaluation completed Falls evaluation completed Falls evaluation completed    FALL RISK PREVENTION PERTAINING TO THE HOME:  Any stairs in or around the home? Yes  If so, are there any without handrails? No  Home free of loose throw rugs  in walkways, pet beds, electrical cords, etc? Yes  Adequate lighting in your home to reduce risk of falls? Yes   ASSISTIVE DEVICES UTILIZED TO PREVENT FALLS:  Life alert? No  Use of a cane, walker or w/c? No  Grab bars in the bathroom? No  Shower chair or bench in shower? Yes  Elevated toilet seat or a handicapped toilet? No   TIMED UP AND GO:  Was the test performed? No .  Phone visit.   Cognitive Function:     6CIT Screen 12/12/2021  What Year? 0 points  What month? 0 points  What time? 0 points  Count back from 20 0 points  Months in reverse 0 points  Repeat phrase 0 points  Total Score 0    Immunizations Immunization History  Administered Date(s) Administered   Influenza,inj,Quad PF,6+ Mos 09/05/2013, 08/28/2014, 09/20/2015, 10/01/2016, 01/11/2018, 09/19/2021   Janssen (J&J) SARS-COV-2 Vaccination 07/22/2020   Pneumococcal Conjugate-13 01/11/2018   Pneumococcal Polysaccharide-23 10/27/2014, 09/19/2021    TDAP status: Due, Education has been provided regarding the importance of this vaccine. Advised may receive this vaccine at local pharmacy or Health Dept. Aware to provide a copy of the vaccination record if obtained from local pharmacy or Health  Dept. Verbalized acceptance and understanding.  Flu Vaccine status: Up to date  Pneumococcal vaccine status: Up to date  Covid-19 vaccine status: Completed vaccines  Qualifies for Shingles Vaccine? Yes   Zostavax completed No   Shingrix Completed?: No.    Education has been provided regarding the importance of this vaccine. Patient has been advised to call insurance company to determine out of pocket expense if they have not yet received this vaccine. Advised may also receive vaccine at local pharmacy or Health Dept. Verbalized acceptance and understanding.  Screening Tests Health Maintenance  Topic Date Due   HIV Screening  Never done   Hepatitis C Screening  Never done   TETANUS/TDAP  Never done   Zoster Vaccines-  Shingrix (1 of 2) Never done   COVID-19 Vaccine (2 - Booster for Janssen series) 09/16/2020   MAMMOGRAM  02/28/2022   PAP SMEAR-Modifier  09/19/2024   COLONOSCOPY (Pts [id number] Insurance coverage will need to be confirmed)  05/14/2026   INFLUENZA VACCINE  Completed   HPV VACCINES  Aged Out    Health Maintenance  Health Maintenance Due  Topic Date Due   HIV Screening  Never done   Hepatitis C Screening  Never done   TETANUS/TDAP  Never done   Zoster Vaccines- Shingrix (1 of 2) Never done   COVID-19 Vaccine (2 - Booster for Janssen series) 09/16/2020    Colorectal cancer screening: Type of screening: Colonoscopy. Completed 05/14/2016. Repeat every 10 years  Mammogram status: Ordered 09/19/2021 by Dr. Tanya NonesPickard. Pt provided with contact info and advised to call to schedule appt.   Bone Density status: Ordered Not due until age 58. Pt provided with contact info and advised to call to schedule appt.  Lung Cancer Screening: (Low Dose CT Chest recommended if Age 52-80 years, 30 pack-year currently smoking OR have quit w/in 15years.) does qualify.   Lung Cancer Screening Referral: Lung cancer screening order placed.  Additional Screening:  Hepatitis C Screening: does qualify; Completed Due  Vision Screening: Recommended annual ophthalmology exams for early detection of glaucoma and other disorders of the eye. Is the patient up to date with their annual eye exam?  Yes  Who is the provider or what is the name of the office in which the patient attends annual eye exams? Dr. Harriette [place] If pt is not established with a provider, would they like to be referred to a provider to establish care? No .   Dental Screening: Recommended annual dental exams for proper oral hygiene  Community Resource Referral / Chronic Care Management: CRR required this visit?  No   CCM required this visit?  No      Plan:     I have personally reviewed and noted the following in the patients chart:    Medical and social history Use of alcohol, tobacco or illicit drugs  Current medications and supplements including opioid prescriptions. Patient is not currently taking opioid prescriptions. Functional ability and status Nutritional status Physical activity Advanced directives List of other physicians Hospitalizations, surgeries, and ER visits in previous 12 months Vitals Screenings to include cognitive, depression, and falls Referrals and appointments  In addition, I have reviewed and discussed with patient certain preventive protocols, quality metrics, and best practice recommendations. A written personalized care plan for preventive services as well as general preventive health recommendations were provided to patient.     Felicia DashMary Jane K Mliss Wedin, LPN   [id number]/20/2023   Nurse Notes: Pt is up to date with age appropriate health maintenance.  Discussed Tdap and Shingrix vaccines and how to obtain. Order placed for Mammogram at 09/19/2021 visit for April of 23. Pt would like to do Lung Cancer Screen.

## 2021-12-12 NOTE — Telephone Encounter (Signed)
Called pt to do AWV. Pt had an appointment with you last Friday for a possible UTI and pt was supposed to bring in a urine sample. Pt did not bring sample in last week and was a no show for her phone appointment. Pt states her father fell and was unable to come in or call. Pt asks if she can bring a sample in today to be checked. I advised the patient that she may need an appointment but I would ask. Pt verbalized understanding.

## 2021-12-15 ENCOUNTER — Telehealth (INDEPENDENT_AMBULATORY_CARE_PROVIDER_SITE_OTHER): Payer: Medicare PPO | Admitting: Nurse Practitioner

## 2021-12-15 ENCOUNTER — Other Ambulatory Visit: Payer: Self-pay

## 2021-12-15 ENCOUNTER — Encounter: Payer: Self-pay | Admitting: Nurse Practitioner

## 2021-12-15 DIAGNOSIS — N3001 Acute cystitis with hematuria: Secondary | ICD-10-CM | POA: Diagnosis not present

## 2021-12-15 DIAGNOSIS — R300 Dysuria: Secondary | ICD-10-CM

## 2021-12-15 LAB — URINALYSIS, ROUTINE W REFLEX MICROSCOPIC
Bilirubin Urine: NEGATIVE
Glucose, UA: NEGATIVE
Hyaline Cast: NONE SEEN /LPF
Ketones, ur: NEGATIVE
Leukocytes,Ua: NEGATIVE
Nitrite: POSITIVE — AB
Specific Gravity, Urine: 1.02 (ref 1.001–1.035)
pH: 5.5 (ref 5.0–8.0)

## 2021-12-15 LAB — MICROSCOPIC MESSAGE

## 2021-12-15 MED ORDER — CIPROFLOXACIN HCL 250 MG PO TABS
250.0000 mg | ORAL_TABLET | Freq: Two times a day (BID) | ORAL | 0 refills | Status: AC
Start: 2021-12-15 — End: 2021-12-18

## 2021-12-15 NOTE — Progress Notes (Signed)
Subjective:    Patient ID: Felicia [name], female    DOB: [birth year], 56 y.o.   MRN: [id number]  HPI: Felicia [name] is a 56 y.o. female presenting virtually for pain with urination and increased urinary urgency.  Chief Complaint  Patient presents with   Dysuria   Urinary Urgency   URINARY SYMPTOMS Duration: weeks Dysuria: yes Urinary frequency: yes Urgency: yes Small volume voids: yes Symptom severity: moderate Urinary incontinence: yes Foul odor: yes Hematuria: no Abdominal pain: yes; lower abdomen Back pain: yes; right back pain is not new Suprapubic pain/pressure: yes Flank pain: no Fever:  no Nausea: no Vomiting: no Relief with cranberry juice: no Relief with pyridium: no Status: worse Previous urinary tract infection: yes Recurrent urinary tract infection: no Sexual activity: No sexually active/monogomous/practicing safe sex Vaginal discharge: no Treatments attempted: water, cranberry juice   Allergies  Allergen Reactions   Bee Pollen Swelling and Other (See Comments)    Also ticks    Bactrim [Sulfamethoxazole-Trimethoprim] Hives   Cephalosporins Hives   Doxycycline Hives   Tetracyclines & Related Hives   Other Itching    Nuts causes itching and hives not sure what type   Augmentin [Amoxicillin-Pot Clavulanate] Diarrhea   Codeine Nausea Only   Reflex Pain Relief [Menthol] Rash    Muscle Rub    Outpatient Encounter Medications as of 12/15/2021  Medication Sig   ciprofloxacin (CIPRO) 250 MG tablet Take 1 tablet (250 mg total) by mouth 2 (two) times daily for 3 days.   albuterol (VENTOLIN HFA) 108 (90 Base) MCG/ACT inhaler INHALE 2 PUFFS INTO THE LUNGS EVERY 6 HOURS AS NEEDED FOR WHEEZE/SHORTNES OF BREATH   aspirin EC 325 MG tablet Take 325 mg by mouth daily.   atorvastatin (LIPITOR) 80 MG tablet TAKE 1 TABLET BY MOUTH EVERY DAY   clonazePAM (KLONOPIN) 1 MG tablet Take 1 tablet (1 mg total) by mouth 3 (three) times daily.   cyclobenzaprine  (FLEXERIL) 10 MG tablet TAKE 1 TABLET BY MOUTH THREE TIMES A DAY AS NEEDED FOR MUSCLE SPASMS   dicyclomine (BENTYL) 20 MG tablet TAKE 1 TABLET (20 MG TOTAL) BY MOUTH 3 (THREE) TIMES DAILY WITH MEALS AS NEEDED FOR SPASMS.   diphenhydrAMINE (BENADRYL) 25 MG tablet Take 50 mg by mouth every 6 (six) hours as needed for allergies.   EPIPEN 2-PAK 0.3 MG/0.3ML SOAJ injection USE AS DIRECTED (Patient taking differently: Inject 0.3 mg into the muscle as needed for anaphylaxis.)   ezetimibe (ZETIA) 10 MG tablet Take 1 tablet (10 mg total) by mouth daily.   hydrochlorothiazide (HYDRODIURIL) 25 MG tablet Take 1 tablet (25 mg total) by mouth daily.   meloxicam (MOBIC) 15 MG tablet meloxicam 15 mg tablet  TAKE 1 TABLET (15 MG TOTAL) BY MOUTH DAILY.   metoprolol succinate (TOPROL-XL) 50 MG 24 hr tablet Take 1 tablet (50 mg total) by mouth daily. Take with or immediately following a meal.   ondansetron (ZOFRAN) 4 MG tablet Take 1 tablet (4 mg total) by mouth every 8 (eight) hours as needed for nausea or vomiting.   potassium chloride SA (KLOR-CON M) 20 MEQ tablet Take 2 tablets (40 mEq total) by mouth 2 (two) times daily.   traMADol (ULTRAM) 50 MG tablet TAKE 2 TABLETS BY MOUTH EVERY 8 HOURS AS NEEDED   [DISCONTINUED] predniSONE (DELTASONE) 20 MG tablet 3 tabs poqday 1-2, 2 tabs poqday 3-4, 1 tab poqday 5-6   No facility-administered encounter medications on file as of 12/15/2021.  Patient Active Problem List   Diagnosis Date Noted   Incisional hernia, without obstruction or gangrene 01/23/2021   Chronic left shoulder pain 09/13/2019   Partial tear of left subscapularis tendon 08/01/2019   Low back strain 05/08/2015   Asthma, chronic 05/08/2015   Atherosclerosis of native artery of both lower extremities with intermittent claudication ([place]) 10/23/2014   PVD (peripheral vascular disease) with claudication ([place]) 09/26/2014   Atherosclerosis of native arteries of extremity with intermittent claudication  ([place]) 09/14/2012   PUD (peptic ulcer disease)    Hyperlipidemia     Past Medical History:  Diagnosis Date   Allergy    year around   Anemia    Anxiety    Arthritis    Asthma    Atrial fibrillation ([place])    Bipolar 1 disorder ([place])    Borderline diabetes    Depression    Early cataract    right   Ectopic pregnancy    Fatty liver    Headache    Heart murmur    Hyperlipidemia    Insomnia    Peripheral arterial disease ([facility])    Pneumonia    PUD (peptic ulcer disease)    Right knee pain 12-05-2015   per pt.  she fell out of her husband's truck and landed on her right knee   Shortness of breath dyspnea    Wears glasses    Wears partial dentures     Relevant past medical, surgical, family and social history reviewed and updated as indicated. Interim medical history since our last visit reviewed.  Review of Systems Per HPI unless specifically indicated above     Objective:    LMP 12/26/2012   Wt Readings from Last 3 Encounters:  12/12/21 130 lb (59 kg)  09/19/21 130 lb 9.6 oz (59.2 kg)  09/08/21 130 lb (59 kg)    Physical Exam Vitals and nursing note reviewed.  Constitutional:      General: She is not in acute distress.    Appearance: Normal appearance. She is not toxic-appearing.  Eyes:     General: No scleral icterus. Pulmonary:     Effort: Pulmonary effort is normal. No respiratory distress.  Skin:    Coloration: Skin is not jaundiced or pale.     Findings: No erythema.  Neurological:     Mental Status: She is alert and oriented to person, place, and time.  Psychiatric:        Mood and Affect: Mood normal.        Behavior: Behavior normal.        Thought Content: Thought content normal.        Judgment: Judgment normal.    Results for orders placed or performed in visit on 12/15/21  Urinalysis, Routine w reflex microscopic  Result Value Ref Range   Color, Urine YELLOW YELLOW   APPearance CLOUDY (A) CLEAR   Specific Gravity, Urine 1.020 1.001 -  1.035   pH 5.5 5.0 - 8.0   Glucose, UA NEGATIVE NEGATIVE   Bilirubin Urine NEGATIVE NEGATIVE   Ketones, ur NEGATIVE NEGATIVE   Hgb urine dipstick 3+ (A) NEGATIVE   Protein, ur 2+ (A) NEGATIVE   Nitrite POSITIVE (A) NEGATIVE   Leukocytes,Ua NEGATIVE NEGATIVE   WBC, UA 0-5 0 - 5 /HPF   RBC / HPF 3-10 (A) 0 - 2 /HPF   Squamous Epithelial / LPF 0-5 < OR = 5 /HPF   Bacteria, UA MANY (A) NONE SEEN /HPF   Hyaline Cast NONE  SEEN NONE SEEN /LPF   Yeast FEW (A) NONE SEEN /HPF  Microscopic Message  Result Value Ref Range   Note        Assessment & Plan:  1. Dysuria Acute.  UA today shows 3+ blood, nitrites, 2+ protein; 3-10 RBC under microscope, many bacteria, and few yeast.  Treat for acute cystitis with Ciprofloxacin 250 twice daily for 3 days.  Patient with a history of allergic reaction to cephalosporins, Bactrim, among other antibiotics.  Send urine for culture and follow.  If symptoms worsen or nausea/vomiting develop and patient unable to keep fluids or food down, I encouraged her to go to the ER.  - Urinalysis, Routine w reflex microscopic - Urine Culture - Microscopic Message - ciprofloxacin (CIPRO) 250 MG tablet; Take 1 tablet (250 mg total) by mouth 2 (two) times daily for 3 days.  Dispense: 6 tablet; Refill: 0  Follow up plan: Return if symptoms worsen or fail to improve.  Due to the catastrophic nature of the COVID-19 pandemic, this video visit was completed soley via audio and visual contact via Caregility due to the restrictions of the COVID-19 pandemic.  All issues as above were discussed and addressed. Physical exam was done as above through visual confirmation on Caregility. If it was felt that the patient should be evaluated in the office, they were directed there. The patient verbally consented to this visit. Location of the patient: home Location of the provider: work Those involved with this call:  Provider: Noemi Chapel, DNP, FNP-C CMA: Elizabeth Palau, CMA Front  Desk/Registration: Vevelyn Pat  Time spent on call:  5 minutes with patient face to face via video conference. More than 50% of this time was spent in counseling and coordination of care. 15 minutes total spent in review of patient's record and preparation of their chart. I verified patient identity using two factors (patient name and date of birth). Patient consents verbally to being seen via telemedicine visit today.

## 2021-12-16 ENCOUNTER — Telehealth: Payer: Self-pay

## 2021-12-16 NOTE — Telephone Encounter (Signed)
Pt came into office wanting to get her handicap placard form completed. Intake form and placard form placed in nurse's folder. Will call pt when available for pick up.   Bc#: [phone]

## 2021-12-17 ENCOUNTER — Other Ambulatory Visit: Payer: Self-pay

## 2021-12-17 ENCOUNTER — Ambulatory Visit: Payer: Medicare PPO | Admitting: Nurse Practitioner

## 2021-12-17 LAB — URINE CULTURE
MICRO NUMBER:: 12905315
SPECIMEN QUALITY:: ADEQUATE

## 2021-12-17 MED ORDER — METOPROLOL SUCCINATE ER 50 MG PO TB24: 50.0000 mg | ORAL_TABLET | Freq: Every day | ORAL | 3 refills | Status: DC

## 2021-12-24 ENCOUNTER — Telehealth: Payer: Self-pay

## 2021-12-24 NOTE — Telephone Encounter (Signed)
Pt called in inquiring about if it is possible for her to get a earlier refill of traMADol (ULTRAM) 50 MG next week around 2/7. Pt states that she will be going out of town and will be out of this med. Please advise.  Cb#: [phone]

## 2021-12-24 NOTE — Telephone Encounter (Signed)
Per pharmacy pt picked up last rx on 12/08/21. This means pt cannot pick up next rx any sooner than 01/05/22, this would be a 3-day early fill.  Spoke with pt and advised. She states she will be back from out of town by then, so that is fine, she will not need an early fill. Nothing further needed at this time.

## 2022-01-05 ENCOUNTER — Other Ambulatory Visit: Payer: Self-pay | Admitting: Family Medicine

## 2022-01-19 ENCOUNTER — Other Ambulatory Visit: Payer: Self-pay | Admitting: Family Medicine

## 2022-01-19 DIAGNOSIS — M5431 Sciatica, right side: Secondary | ICD-10-CM

## 2022-01-29 ENCOUNTER — Other Ambulatory Visit: Payer: Self-pay | Admitting: Family Medicine

## 2022-02-11 NOTE — Telephone Encounter (Signed)
Patient called to request provider complete application for handicapped license plate; parking placard not needed. ? ?Patient already paid $20 fee; wants to know if she will be charged another $20 fee. ? ?Intake form and application placed in hanging folder at nurse's desk; requesting call when ready for pick up.  ? ?Please advise at [phone].  ?

## 2022-02-12 NOTE — Telephone Encounter (Signed)
Spoke with pt re application for handicapped license plate. Told pt we will no be able to complete the form since her name is not on title for the car which she wants the plate for.  ? ?Per pt she understands and will talk to her husband and if needed she will go get another form. ?

## 2022-02-15 ENCOUNTER — Other Ambulatory Visit: Payer: Self-pay | Admitting: Family Medicine

## 2022-02-17 NOTE — Telephone Encounter (Signed)
LOV 09/19/21 ? ?Tramadol ?Last refill 01/06/22, #180, 0 refills ? ?Klonopin ?Last refill 10/27/21, #90, 3 refills ? ?Please review, thanks! ? ?

## 2022-02-19 ENCOUNTER — Other Ambulatory Visit: Payer: Self-pay | Admitting: Family Medicine

## 2022-02-19 DIAGNOSIS — M5431 Sciatica, right side: Secondary | ICD-10-CM

## 2022-03-15 ENCOUNTER — Other Ambulatory Visit: Payer: Self-pay | Admitting: Family Medicine

## 2022-03-15 DIAGNOSIS — M5431 Sciatica, right side: Secondary | ICD-10-CM

## 2022-03-16 NOTE — Telephone Encounter (Signed)
Refilled 01/19/22, #90, 1 refill ?Not sure how long or how often patient should be taking. ? ? ?Please advise if ok to refill, thanks! ?

## 2022-03-23 ENCOUNTER — Other Ambulatory Visit: Payer: Self-pay | Admitting: Family Medicine

## 2022-03-24 NOTE — Telephone Encounter (Signed)
Requested medication (s) are due for refill today: yes ? ?Requested medication (s) are on the active medication list: yes ? ?Last refill:  02/17/22 #180/0 ? ?Future visit scheduled: no ? ?Notes to clinic:  Unable to refill per protocol, cannot delegate. ? ?  ?Requested Prescriptions  ?Pending Prescriptions Disp Refills  ? traMADol (ULTRAM) 50 MG tablet [Pharmacy Med Name: TRAMADOL HCL 50 MG TABLET] 180 tablet 0  ?  Sig: TAKE 2 TABLETS BY MOUTH EVERY 8 HOURS AS NEEDED  ?  ? Not Delegated - Analgesics:  Opioid Agonists Failed - 03/23/2022  9:30 PM  ?  ?  Failed - This refill cannot be delegated  ?  ?  Failed - Urine Drug Screen completed in last 360 days  ?  ?  Failed - Valid encounter within last 3 months  ?  Recent Outpatient Visits   ? ?      ? 3 months ago Acute cystitis with hematuria  ? [facility] Valentino Nose, NP  ? 6 months ago General medical exam  ? [facility] Donita Brooks, MD  ? 6 months ago Acute midline low back pain with right-sided sciatica  ? [facility] Pickard, Priscille Heidelberg, MD  ? 1 year ago Ventral hernia without obstruction or gangrene  ? [facility] Pickard, Priscille Heidelberg, MD  ? 1 year ago Rib pain on left side  ? [facility] Pickard, Priscille Heidelberg, MD  ? ?  ?  ? ? ?  ?  ?  ? ?

## 2022-03-26 NOTE — Telephone Encounter (Signed)
LOV 09/19/21 ? ?Last refill ?02/17/22 #180  0 refills ? ?Please review, thanks! ? ?

## 2022-05-01 ENCOUNTER — Other Ambulatory Visit: Payer: Self-pay | Admitting: Family Medicine

## 2022-05-14 ENCOUNTER — Other Ambulatory Visit: Payer: Self-pay | Admitting: Family Medicine

## 2022-05-14 DIAGNOSIS — M5431 Sciatica, right side: Secondary | ICD-10-CM

## 2022-05-14 NOTE — Telephone Encounter (Signed)
Requested medications are due for refill today.  yes  Requested medications are on the active medications list.  yes  Last refill. 03/16/2022 #90 1 refill  Future visit scheduled.   yes  Notes to clinic.  Medication refill is not delegated.    Requested Prescriptions  Pending Prescriptions Disp Refills   cyclobenzaprine (FLEXERIL) 10 MG tablet [Pharmacy Med Name: CYCLOBENZAPRINE 10 MG TABLET] 90 tablet 1    Sig: TAKE 1 TABLET BY MOUTH THREE TIMES A DAY AS NEEDED FOR MUSCLE SPASMS     Not Delegated - Analgesics:  Muscle Relaxants Failed - 05/14/2022 10:06 AM      Failed - This refill cannot be delegated      Passed - Valid encounter within last 6 months    Recent Outpatient Visits           5 months ago Acute cystitis with hematuria   [facility] Valentino Nose, NP   7 months ago General medical exam   [facility] Donita Brooks, MD   8 months ago Acute midline low back pain with right-sided sciatica   [facility] Donita Brooks, MD   1 year ago Ventral hernia without obstruction or gangrene   [facility] Tanya Nones Priscille Heidelberg, MD   1 year ago Rib pain on left side   [facility] Pickard, Priscille Heidelberg, MD

## 2022-05-19 ENCOUNTER — Other Ambulatory Visit: Payer: Self-pay | Admitting: Family Medicine

## 2022-05-25 NOTE — Telephone Encounter (Signed)
Requested medication (s) are due for refill today: unknown, historical provider, no date noted  Requested medication (s) are on the active medication list: yes  Last refill:  unclear  Future visit scheduled: 06/09/22 Pt asking for enough Mobic until appt later this month.  Notes to clinic:  Historical Provider, please assess.       Requested Prescriptions  Pending Prescriptions Disp Refills   meloxicam (MOBIC) 15 MG tablet [Pharmacy Med Name: MELOXICAM 15 MG TABLET] 90 tablet 3    Sig: TAKE 1 TABLET (15 MG TOTAL) BY MOUTH DAILY.     Analgesics:  COX2 Inhibitors Failed - 05/25/2022 10:38 AM      Failed - Manual Review: Labs are only required if the patient has taken medication for more than 8 weeks.      Passed - HGB in normal range and within 360 days    Hemoglobin  Date Value Ref Range Status  08/01/2021 13.6 12.0 - 15.0 g/dL Final         Passed - Cr in normal range and within 360 days    Creat  Date Value Ref Range Status  09/19/2021 0.96 0.50 - 1.03 mg/dL Final         Passed - HCT in normal range and within 360 days    HCT  Date Value Ref Range Status  08/01/2021 40.0 36.0 - 46.0 % Final         Passed - AST in normal range and within 360 days    AST  Date Value Ref Range Status  09/19/2021 13 10 - 35 U/L Final         Passed - ALT in normal range and within 360 days    ALT  Date Value Ref Range Status  09/19/2021 13 6 - 29 U/L Final         Passed - eGFR is 30 or above and within 360 days    GFR, Est African American  Date Value Ref Range Status  12/20/2020 73 > OR = 60 mL/min/1.63m Final   GFR, Est Non African American  Date Value Ref Range Status  12/20/2020 63 > OR = 60 mL/min/1.717mFinal   GFR, Estimated  Date Value Ref Range Status  07/31/2021 53 (L) >60 mL/min Final    Comment:    (NOTE) Calculated using the CKD-EPI Creatinine Equation (2021)    eGFR  Date Value Ref Range Status  09/19/2021 69 > OR = 60 mL/min/1.7344minal     Comment:    The eGFR is based on the CKD-EPI 2021 equation. To calculate  the new eGFR from a previous Creatinine or Cystatin C result, go to https://www.kidney.org/professionals/ kdoqi/gfr%5Fcalculator          Passed - Patient is not pregnant      Passed - Valid encounter within last 12 months    Recent Outpatient Visits           5 months ago Acute cystitis with hematuria   [facility]   8 months ago General medical exam   [facility]   8 months ago Acute midline low back pain with right-sided sciatica   [facility]   1 year ago Ventral hernia without obstruction or gangrene   [facility] Mc[facility]   1 year ago Rib pain on left side   [place]  [place]       Future Appointments             In 2 weeks Pickard, Cammie Mcgee, MD [place]

## 2022-05-25 NOTE — Telephone Encounter (Signed)
Pt called in requesting a courtesy refill of her (FLEXERIL) 10 MG to last until her scheduled ov on 7/18. Pt asks for this refill to be sent to [facility] on [address]. Please advise.  Cb#: [phone]

## 2022-05-28 NOTE — Telephone Encounter (Signed)
Patient called stating that she has an appt on June 09, 2022. Patient has been out since last Tuesday had a bad headache and not able to sleeping well. She would like to know if she could please get a refill prior to her appt.  Last OV 12/17/2021

## 2022-06-09 ENCOUNTER — Ambulatory Visit: Payer: Medicare PPO | Admitting: Family Medicine

## 2022-06-09 VITALS — BP 142/84 | HR 99 | Temp 98.4°F | Ht <= 58 in | Wt 136.0 lb

## 2022-06-09 DIAGNOSIS — G8929 Other chronic pain: Secondary | ICD-10-CM

## 2022-06-09 DIAGNOSIS — M549 Dorsalgia, unspecified: Secondary | ICD-10-CM | POA: Diagnosis not present

## 2022-06-09 DIAGNOSIS — E7800 Pure hypercholesterolemia, unspecified: Secondary | ICD-10-CM | POA: Diagnosis not present

## 2022-06-09 DIAGNOSIS — M25512 Pain in left shoulder: Secondary | ICD-10-CM

## 2022-06-09 DIAGNOSIS — R739 Hyperglycemia, unspecified: Secondary | ICD-10-CM | POA: Diagnosis not present

## 2022-06-09 DIAGNOSIS — F411 Generalized anxiety disorder: Secondary | ICD-10-CM

## 2022-06-09 DIAGNOSIS — I10 Essential (primary) hypertension: Secondary | ICD-10-CM | POA: Diagnosis not present

## 2022-06-09 MED ORDER — PREDNISONE 20 MG PO TABS: ORAL_TABLET | ORAL | 0 refills | Status: DC

## 2022-06-09 MED ORDER — HYDROCHLOROTHIAZIDE 25 MG PO TABS: 25.0000 mg | ORAL_TABLET | Freq: Every day | ORAL | 3 refills | Status: DC

## 2022-06-09 MED ORDER — METOPROLOL SUCCINATE ER 50 MG PO TB24: 50.0000 mg | ORAL_TABLET | Freq: Every day | ORAL | 3 refills | Status: DC

## 2022-06-09 MED ORDER — CLONAZEPAM 1 MG PO TABS
1.0000 mg | ORAL_TABLET | Freq: Three times a day (TID) | ORAL | 3 refills | Status: DC
Start: 2022-06-09 — End: 2022-10-19

## 2022-06-09 MED ORDER — ATORVASTATIN CALCIUM 80 MG PO TABS: 80.0000 mg | ORAL_TABLET | Freq: Every day | ORAL | 3 refills | Status: DC

## 2022-06-09 MED ORDER — ALBUTEROL SULFATE HFA 108 (90 BASE) MCG/ACT IN AERS: INHALATION_SPRAY | RESPIRATORY_TRACT | 3 refills | Status: DC

## 2022-06-09 MED ORDER — EZETIMIBE 10 MG PO TABS: 10.0000 mg | ORAL_TABLET | Freq: Every day | ORAL | 3 refills | Status: AC

## 2022-06-09 MED ORDER — POTASSIUM CHLORIDE CRYS ER 20 MEQ PO TBCR
40.0000 meq | EXTENDED_RELEASE_TABLET | Freq: Every day | ORAL | 1 refills | Status: DC
Start: 2022-06-09 — End: 2022-10-22

## 2022-06-09 NOTE — Progress Notes (Signed)
Subjective:     Patient ID: Felicia [name], female   DOB: 1966/10/10, 56 y.o.   MRN: [id number]  Patient has a history of asthma.  She also has a history of smoking and continues to smoke.  As result she reports several weeks of coughing and wheezing is getting worse with the increased heat humidity and poor air quality.  Today on examination she has diminished breath sounds bilaterally with faint expiratory wheezing.  She denies any fevers or chills.  She denies any hemoptysis.  She is also overdue for fasting lab work.  She has a history of prediabetes and she would like to have her hemoglobin A1c tested.  Her blood pressure today is borderline at 142/84.  She denies any chest pain or angina.  However she does report her chronic back pain.  She has a history of surgery on her back as well as shoulder surgery due to a rotator cuff repair and shoulder arthroscopy.  She takes chronic tramadol for this.  She would also like a refill of her Klonopin that she uses for anxiety.  She takes Klonopin 3 times a day due to stress and anxiety.  She has her father currently living with her and she is providing his care and assistance with his ADLs.  This increases her anxiety level.  She denies any depression or suicidal thoughts Past Medical History:  Diagnosis Date   Allergy    year around   Anemia    Anxiety    Arthritis    Asthma    Atrial fibrillation (HCC)    Bipolar 1 disorder (HCC)    Borderline diabetes    Depression    Early cataract    right   Ectopic pregnancy    Fatty liver    Headache    Heart murmur    Hyperlipidemia    Insomnia    Peripheral arterial disease (HCC)    Pneumonia    PUD (peptic ulcer disease)    Right knee pain 12-05-2015   per pt.  she fell out of her husband's truck and landed on her right knee   Shortness of breath dyspnea    Wears glasses    Wears partial dentures    Past Surgical History:  Procedure Laterality Date   ABDOMINAL AORTAGRAM N/A 09/12/2012    Procedure: ABDOMINAL Ronny Flurry;  Surgeon: Fransisco Hertz, MD;  Location: [facility] CATH LAB;  Service: Cardiovascular;  Laterality: N/A;   AORTA - BILATERAL FEMORAL ARTERY BYPASS GRAFT N/A 10/23/2014   Procedure: AORTOBIFEMORAL BYPASS GRAFT;  Surgeon: Chuck Hint, MD;  Location: [facility] OR;  Service: Vascular;  Laterality: N/A;   BACK SURGERY     CESAREAN SECTION     COLONOSCOPY  01-14-2004   > 11 yr ago Dr Loreta Ave - normal - report in epic    DILATATION & CURETTAGE/HYSTEROSCOPY WITH MYOSURE N/A 10/15/2015   Procedure: DILATATION & CURETTAGE/HYSTEROSCOPY WITH MYOSURE;  Surgeon: Ok Edwards, MD;  Location: [facility] ORS;  Service: Gynecology;  Laterality: N/A;   ESOPHAGOGASTRODUODENOSCOPY  01-14-2004   gastritis- dr Loreta Ave- in epic already    INCISIONAL HERNIA REPAIR N/A 08/01/2021   Procedure: INCISIONAL HERNIA REPAIR TIMES TWO, ONE WITH VENTRALEX ST HERNIA MESH, THE SECOND BY PRIMARY CLOSURE;  Surgeon: Lucretia Roers, MD;  Location: [facility]S;  Service: General;  Laterality: N/A;   MULTIPLE TOOTH EXTRACTIONS     NOSE SURGERY     NOSE SURGERY     SHOULDER ARTHROSCOPY WITH SUBACROMIAL DECOMPRESSION, ROTATOR  CUFF REPAIR AND BICEP TENDON REPAIR Left 09/26/2019   Procedure: left shoulder arthroscopy, biceps release and tenodesis, mini open rotator cuff tear repair subscapularis and supraspinatus;  Surgeon: Cammy Copa, MD;  Location: [facility] OR;  Service: Orthopedics;  Laterality: Left;   SHOULDER SURGERY     rotator cuff repair    TONSILLECTOMY     TUBAL LIGATION     UPPER GASTROINTESTINAL ENDOSCOPY  01-14-2004   in epic- dr Loreta Ave- gastritis    Current Outpatient Medications on File Prior to Visit  Medication Sig Dispense Refill   aspirin EC 325 MG tablet Take 325 mg by mouth daily.     cyclobenzaprine (FLEXERIL) 10 MG tablet TAKE 1 TABLET BY MOUTH THREE TIMES A DAY AS NEEDED FOR MUSCLE SPASMS 90 tablet 1   dicyclomine (BENTYL) 20 MG tablet TAKE 1 TABLET (20 MG TOTAL) BY MOUTH 3 (THREE) TIMES DAILY WITH  MEALS AS NEEDED FOR SPASMS. 20 tablet 0   diphenhydrAMINE (BENADRYL) 25 MG tablet Take 50 mg by mouth every 6 (six) hours as needed for allergies.     EPIPEN 2-PAK 0.3 MG/0.3ML SOAJ injection USE AS DIRECTED (Patient taking differently: Inject 0.3 mg into the muscle as needed for anaphylaxis.) 2 Device 0   meloxicam (MOBIC) 15 MG tablet meloxicam 15 mg tablet  TAKE 1 TABLET (15 MG TOTAL) BY MOUTH DAILY.     ondansetron (ZOFRAN) 4 MG tablet Take 1 tablet (4 mg total) by mouth every 8 (eight) hours as needed for nausea or vomiting. 20 tablet 0   traMADol (ULTRAM) 50 MG tablet TAKE 2 TABLETS BY MOUTH EVERY 8 HOURS AS NEEDED 180 tablet 0   No current facility-administered medications on file prior to visit.   Allergies  Allergen Reactions   Bee Pollen Swelling and Other (See Comments)    Also ticks    Bactrim [Sulfamethoxazole-Trimethoprim] Hives   Cephalosporins Hives   Doxycycline Hives   Tetracyclines & Related Hives   Other Itching    Nuts causes itching and hives not sure what type   Augmentin [Amoxicillin-Pot Clavulanate] Diarrhea   Codeine Nausea Only   Reflex Pain Relief [Menthol] Rash    Muscle Rub   Social History   Socioeconomic History   Marital status: Married    Spouse name: Loraine Leriche   Number of children: 2   Years of education: Not on file   Highest education level: Not on file  Occupational History   Not on file  Tobacco Use   Smoking status: Every Day    Packs/day: 0.50    Years: 30.00    Total pack years: 15.00    Types: Cigarettes    Start date: 01/29/1981   Smokeless tobacco: Never  Vaping Use   Vaping Use: Never used  Substance and Sexual Activity   Alcohol use: Yes    Alcohol/week: 1.0 standard drink of alcohol    Types: 1 Shots of liquor per week    Comment: very occassionally on social occassions   Drug use: No   Sexual activity: Yes  Other Topics Concern   Not on file  Social History Narrative   Married 38 years in 2022.   2 daughters   4  grandsons, 1 granddaughter   Social Determinants of Health   Financial Resource Strain: Low Risk  (12/12/2021)   Overall Financial Resource Strain (CARDIA)    Difficulty of Paying Living Expenses: Not very hard  Food Insecurity: No Food Insecurity (12/12/2021)   Hunger Vital Sign  Worried About Programme researcher, broadcasting/film/video in the Last Year: Never true    Ran Out of Food in the Last Year: Never true  Transportation Needs: No Transportation Needs (12/12/2021)   PRAPARE - Administrator, Civil Service (Medical): No    Lack of Transportation (Non-Medical): No  Physical Activity: Insufficiently Active (12/12/2021)   Exercise Vital Sign    Days of Exercise per Week: 5 days    Minutes of Exercise per Session: 20 min  Stress: No Stress Concern Present (12/12/2021)   [employer] of Occupational Health - Occupational Stress Questionnaire    Feeling of Stress : Only a little  Social Connections: Moderately Integrated (12/12/2021)   Social Connection and Isolation Panel [NHANES]    Frequency of Communication with Friends and Family: More than three times a week    Frequency of Social Gatherings with Friends and Family: More than three times a week    Attends Religious Services: 1 to 4 times per year    Active Member of [employer] or Organizations: No    Attends Banker Meetings: Never    Marital Status: Married  Catering manager Violence: Not At Risk (12/12/2021)   Humiliation, Afraid, Rape, and Kick questionnaire    Fear of Current or Ex-Partner: No    Emotionally Abused: No    Physically Abused: No    Sexually Abused: No    Review of Systems  All other systems reviewed and are negative.      Objective:   Physical Exam Vitals reviewed. Exam conducted with a chaperone present.  Constitutional:      General: She is not in acute distress.    Appearance: Normal appearance. She is well-developed and normal weight. She is not ill-appearing, toxic-appearing or diaphoretic.   HENT:     Right Ear: Tympanic membrane and ear canal normal.     Left Ear: Tympanic membrane and ear canal normal.     Nose: Nose normal.     Mouth/Throat:     Mouth: Mucous membranes are dry.     Pharynx: Oropharynx is clear. No oropharyngeal exudate.  Eyes:     Conjunctiva/sclera: Conjunctivae normal.     Pupils: Pupils are equal, round, and reactive to light.  Cardiovascular:     Heart sounds: Normal heart sounds. No murmur heard.    No friction rub. No gallop.  Pulmonary:     Effort: Pulmonary effort is normal. No respiratory distress.     Breath sounds: Decreased air movement present. No stridor. Decreased breath sounds and wheezing present. No rhonchi or rales.  Abdominal:     General: A surgical scar is present. Bowel sounds are normal. There is no distension. There are no signs of injury.     Palpations: Abdomen is soft. There is no shifting dullness or mass.     Tenderness: There is no abdominal tenderness.     Hernia: No hernia is present.  Genitourinary:    Exam position: Lithotomy position.     Vagina: No vaginal discharge.     Cervix: No cervical motion tenderness.     Uterus: Normal.      Adnexa: Right adnexa normal and left adnexa normal.       Right: No mass.         Left: No mass.    Musculoskeletal:     Lumbar back: Spasms, tenderness and bony tenderness present. No swelling or edema. Decreased range of motion. Negative right straight leg raise test and negative  left straight leg raise test.       Back:  Skin:    Findings: No rash.  Neurological:     General: No focal deficit present.     Mental Status: She is alert and oriented to person, place, and time. Mental status is at baseline.     Sensory: Sensation is intact. No sensory deficit.     Motor: No weakness.     Coordination: Coordination is intact. Coordination normal.     Gait: Gait is intact. Gait normal.     Deep Tendon Reflexes: Reflexes normal.        Assessment:     Essential  hypertension  Chronic back pain greater than 3 months duration  Pure hypercholesterolemia - Plan: CBC with Differential/Platelet, Lipid panel, COMPLETE METABOLIC PANEL WITH GFR  GAD (generalized anxiety disorder)  Chronic left shoulder pain     Plan:     Patient's blood pressure today is acceptable.  I will check a CBC and CMP and a lipid panel.  Goal LDL cholesterol is less than 70 given her history of peripheral vascular disease.  If her blood sugar is elevated I will add a hemoglobin A1c to monitor her prediabetes.  I will refill her Klonopin that she uses for generalized anxiety disorder.  I will refill her tramadol that she uses for chronic low back pain and chronic left shoulder pain.  However I continue to encourage the patient to quit smoking.  I believe that she is likely dealing with an asthma exacerbation made worse by her smoking.  In addition to smoking cessation, I will start the patient on a prednisone taper pack.  Reassess in 1 week if no better or sooner if worse

## 2022-06-10 ENCOUNTER — Other Ambulatory Visit: Payer: Self-pay | Admitting: Family Medicine

## 2022-06-11 ENCOUNTER — Other Ambulatory Visit: Payer: Self-pay

## 2022-06-11 ENCOUNTER — Other Ambulatory Visit: Payer: Self-pay | Admitting: Family Medicine

## 2022-06-11 MED ORDER — MELOXICAM 15 MG PO TABS: 15.0000 mg | ORAL_TABLET | Freq: Every day | ORAL | 3 refills | Status: DC

## 2022-06-11 NOTE — Telephone Encounter (Signed)
Requested medication (s) are due for refill today: {yes  Requested medication (s) are on the active medication list: yes  Last refill:  05/03/22  Future visit scheduled: no  Notes to clinic:  Unable to refill per protocol, cannot delegate.      Requested Prescriptions  Pending Prescriptions Disp Refills   traMADol (ULTRAM) 50 MG tablet [Pharmacy Med Name: TRAMADOL HCL 50 MG TABLET] 180 tablet 0    Sig: TAKE 2 TABLETS BY MOUTH EVERY 8 HOURS AS NEEDED     Not Delegated - Analgesics:  Opioid Agonists Failed - 06/10/2022  8:13 PM      Failed - This refill cannot be delegated      Failed - Urine Drug Screen completed in last 360 days      Failed - Valid encounter within last 3 months    Recent Outpatient Visits           5 months ago Acute cystitis with hematuria   [facility] Valentino Nose, NP   8 months ago General medical exam   [facility] Donita Brooks, MD   9 months ago Acute midline low back pain with right-sided sciatica   [facility] Tanya Nones Priscille Heidelberg, MD   1 year ago Ventral hernia without obstruction or gangrene   [facility] Tanya Nones Priscille Heidelberg, MD   1 year ago Rib pain on left side   [facility] Pickard, Priscille Heidelberg, MD

## 2022-06-12 ENCOUNTER — Other Ambulatory Visit: Payer: Self-pay | Admitting: Family Medicine

## 2022-06-12 ENCOUNTER — Telehealth: Payer: Self-pay

## 2022-06-12 MED ORDER — TRAMADOL HCL 50 MG PO TABS
50.0000 mg | ORAL_TABLET | Freq: Four times a day (QID) | ORAL | 0 refills | Status: DC | PRN
Start: 2022-06-12 — End: 2022-07-07

## 2022-06-12 NOTE — Telephone Encounter (Signed)
Pt called in stating that all meds were refilled except this med traMADol (ULTRAM) 50 MG tablet. Pt asks [facility] [address] [facility]  LOV: 06/09/22  Cb#: [phone]

## 2022-06-13 LAB — CBC WITH DIFFERENTIAL/PLATELET
Absolute Monocytes: 431 cells/uL (ref 200–950)
Basophils Absolute: 46 cells/uL (ref 0–200)
Basophils Relative: 0.6 %
Eosinophils Absolute: 123 cells/uL (ref 15–500)
Eosinophils Relative: 1.6 %
HCT: 39.8 % (ref 35.0–45.0)
Hemoglobin: 13.4 g/dL (ref 11.7–15.5)
Lymphs Abs: 1833 cells/uL (ref 850–3900)
MCH: 30.4 pg (ref 27.0–33.0)
MCHC: 33.7 g/dL (ref 32.0–36.0)
MCV: 90.2 fL (ref 80.0–100.0)
MPV: 10.6 fL (ref 7.5–12.5)
Monocytes Relative: 5.6 %
Neutro Abs: 5267 cells/uL (ref 1500–7800)
Neutrophils Relative %: 68.4 %
Platelets: 268 10*3/uL (ref 140–400)
RBC: 4.41 10*6/uL (ref 3.80–5.10)
RDW: 12.7 % (ref 11.0–15.0)
Total Lymphocyte: 23.8 %
WBC: 7.7 10*3/uL (ref 3.8–10.8)

## 2022-06-13 LAB — COMPLETE METABOLIC PANEL WITH GFR
AG Ratio: 1.5 (calc) (ref 1.0–2.5)
ALT: 16 U/L (ref 6–29)
AST: 16 U/L (ref 10–35)
Albumin: 4 g/dL (ref 3.6–5.1)
Alkaline phosphatase (APISO): 67 U/L (ref 37–153)
BUN: 11 mg/dL (ref 7–25)
CO2: 29 mmol/L (ref 20–32)
Calcium: 9.3 mg/dL (ref 8.6–10.4)
Chloride: 103 mmol/L (ref 98–110)
Creat: 0.87 mg/dL (ref 0.50–1.03)
Globulin: 2.6 g/dL (calc) (ref 1.9–3.7)
Glucose, Bld: 200 mg/dL — ABNORMAL HIGH (ref 65–99)
Potassium: 4.1 mmol/L (ref 3.5–5.3)
Sodium: 142 mmol/L (ref 135–146)
Total Bilirubin: 0.3 mg/dL (ref 0.2–1.2)
Total Protein: 6.6 g/dL (ref 6.1–8.1)
eGFR: 77 mL/min/{1.73_m2} (ref >=60)

## 2022-06-13 LAB — TEST AUTHORIZATION

## 2022-06-13 LAB — LIPID PANEL
Cholesterol: 169 mg/dL (ref <200)
HDL: 38 mg/dL — ABNORMAL LOW (ref >=50)
LDL Cholesterol (Calc): 101 mg/dL (calc) — ABNORMAL HIGH
Non-HDL Cholesterol (Calc): 131 mg/dL (calc) — ABNORMAL HIGH (ref <130)
Total CHOL/HDL Ratio: 4.4 (calc) (ref <5.0)
Triglycerides: 179 mg/dL — ABNORMAL HIGH (ref <150)

## 2022-06-13 LAB — HEMOGLOBIN A1C W/OUT EAG: Hgb A1c MFr Bld: 5.9 % of total Hgb — ABNORMAL HIGH (ref <5.7)

## 2022-07-07 ENCOUNTER — Other Ambulatory Visit: Payer: Self-pay

## 2022-07-07 MED ORDER — TRAMADOL HCL 50 MG PO TABS: 50.0000 mg | ORAL_TABLET | Freq: Four times a day (QID) | ORAL | 0 refills | Status: DC | PRN

## 2022-07-07 NOTE — Telephone Encounter (Signed)
LOV 06/12/22 Last refill 06/12/22, #185, 0 refills  Pt also stated that the current sig. is incorrect. Pt stated that per prev sig. she was taking 2 to 3 tabs as needed? Stated that the QTY# will not be enough. Pt would like to know why the sig. has been changed?  Pls varify sig?

## 2022-07-22 ENCOUNTER — Other Ambulatory Visit: Payer: Self-pay | Admitting: Family Medicine

## 2022-07-22 DIAGNOSIS — M5431 Sciatica, right side: Secondary | ICD-10-CM

## 2022-07-22 NOTE — Telephone Encounter (Signed)
Requested medication (s) are due for refill today: Yes  Requested medication (s) are on the active medication list: Yes  Last refill:  05/29/22  Future visit scheduled: No  Notes to clinic:  See request.    Requested Prescriptions  Pending Prescriptions Disp Refills   cyclobenzaprine (FLEXERIL) 10 MG tablet [Pharmacy Med Name: CYCLOBENZAPRINE 10 MG TABLET] 90 tablet 1    Sig: TAKE 1 TABLET BY MOUTH THREE TIMES A DAY AS NEEDED FOR MUSCLE SPASM     Not Delegated - Analgesics:  Muscle Relaxants Failed - 07/22/2022  3:30 AM      Failed - This refill cannot be delegated      Failed - Valid encounter within last 6 months    Recent Outpatient Visits           7 months ago Acute cystitis with hematuria   [facility] Valentino Nose, NP   10 months ago General medical exam   [facility] Donita Brooks, MD   10 months ago Acute midline low back pain with right-sided sciatica   [facility] Tanya Nones Priscille Heidelberg, MD   1 year ago Ventral hernia without obstruction or gangrene   [facility] Donita Brooks, MD   2 years ago Rib pain on left side   [facility] Pickard, Priscille Heidelberg, MD

## 2022-08-24 ENCOUNTER — Other Ambulatory Visit: Payer: Self-pay | Admitting: Family Medicine

## 2022-08-24 ENCOUNTER — Telehealth: Payer: Self-pay | Admitting: Family Medicine

## 2022-08-24 MED ORDER — TRAMADOL HCL 50 MG PO TABS: 50.0000 mg | ORAL_TABLET | Freq: Four times a day (QID) | ORAL | 0 refills | Status: DC | PRN

## 2022-08-24 NOTE — Telephone Encounter (Signed)
Patient called to follow up on refill needed for  traMADol (ULTRAM) 50 MG tablet     Sig - Route: Take 1 tablet (50 mg total) by mouth every 6 (six) hours as needed. - Oral  [id number] [place] [place]  [address] [name] [address]  Phone:  [phone]  Fax:  [phone]  DEA #:  [id number]  LOV: 06/09/2022  Patient stated she's been taking 2 TID for several years and is unsure of why the script was lowered. Patient also stated pain in her shoulders and back is increasing and she feels the pain going down her leg (sciatica).  Patient took last pill Wednesday or Thursday of last week.  Please advise patient at [phone]

## 2022-09-18 ENCOUNTER — Other Ambulatory Visit: Payer: Self-pay | Admitting: Family Medicine

## 2022-09-18 DIAGNOSIS — M5431 Sciatica, right side: Secondary | ICD-10-CM

## 2022-09-21 NOTE — Telephone Encounter (Signed)
Refilled 06/09/2022 #180 1 rf  Requested Prescriptions  Pending Prescriptions Disp Refills  . KLOR-CON M20 20 MEQ tablet [Pharmacy Med Name: KLOR-CON M20 TABLET] 180 tablet 1    Sig: TAKE 2 TABLETS BY MOUTH DAILY     Endocrinology:  Minerals - Potassium Supplementation Passed - 09/18/2022  9:21 PM      Passed - K in normal range and within 360 days    Potassium  Date Value Ref Range Status  06/09/2022 4.1 3.5 - 5.3 mmol/L Final         Passed - Cr in normal range and within 360 days    Creat  Date Value Ref Range Status  06/09/2022 0.87 0.50 - 1.03 mg/dL Final         Passed - Valid encounter within last 12 months    Recent Outpatient Visits          9 months ago Acute cystitis with hematuria   [place] Eulogio Bear, NP   1 year ago General medical exam   [place] Susy Frizzle, MD   1 year ago Acute midline low back pain with right-sided sciatica   [place] Susy Frizzle, MD   1 year ago Ventral hernia without obstruction or gangrene   [place] Susy Frizzle, MD   2 years ago Rib pain on left side   [place] Pickard, Cammie Mcgee, MD

## 2022-09-26 ENCOUNTER — Other Ambulatory Visit: Payer: Self-pay | Admitting: Family Medicine

## 2022-09-28 NOTE — Telephone Encounter (Signed)
Requested medication (s) are due for refill today -yes  Requested medication (s) are on the active medication list -yes  Future visit scheduled -no  Last refill: 08/24/22 #180  Notes to clinic: non delegated Rx  Requested Prescriptions  Pending Prescriptions Disp Refills   traMADol (ULTRAM) 50 MG tablet [Pharmacy Med Name: TRAMADOL HCL 50 MG TABLET] 120 tablet 1    Sig: TAKE 1 TABLET BY MOUTH EVERY 6 HOURS AS NEEDED. (INS PAYS FOR 30 DAYS)     Not Delegated - Analgesics:  Opioid Agonists Failed - 09/26/2022  3:58 PM      Failed - This refill cannot be delegated      Failed - Urine Drug Screen completed in last 360 days      Failed - Valid encounter within last 3 months    Recent Outpatient Visits           9 months ago Acute cystitis with hematuria   [place] Eulogio Bear, NP   1 year ago General medical exam   [place] Susy Frizzle, MD   1 year ago Acute midline low back pain with right-sided sciatica   [place] Susy Frizzle, MD   1 year ago Ventral hernia without obstruction or gangrene   [place] Susy Frizzle, MD   2 years ago Rib pain on left side   [place] Pickard, Cammie Mcgee, MD                 Requested Prescriptions  Pending Prescriptions Disp Refills   traMADol (ULTRAM) 50 MG tablet [Pharmacy Med Name: TRAMADOL HCL 50 MG TABLET] 120 tablet 1    Sig: TAKE 1 TABLET BY MOUTH EVERY 6 HOURS AS NEEDED. (INS PAYS FOR 30 DAYS)     Not Delegated - Analgesics:  Opioid Agonists Failed - 09/26/2022  3:58 PM      Failed - This refill cannot be delegated      Failed - Urine Drug Screen completed in last 360 days      Failed - Valid encounter within last 3 months    Recent Outpatient Visits           9 months ago Acute cystitis with hematuria   [place] Eulogio Bear, NP   1 year ago General medical exam   [place] Susy Frizzle, MD   1 year ago Acute midline low back pain with right-sided sciatica   [employer] Susy Frizzle, MD   1 year ago Ventral hernia without obstruction or gangrene   [place] Susy Frizzle, MD   2 years ago Rib pain on left side   [place] Pickard, Cammie Mcgee, MD

## 2022-10-01 ENCOUNTER — Telehealth: Payer: Self-pay | Admitting: Family Medicine

## 2022-10-01 ENCOUNTER — Ambulatory Visit: Payer: Medicare PPO | Admitting: Family Medicine

## 2022-10-01 ENCOUNTER — Other Ambulatory Visit: Payer: Self-pay | Admitting: Family Medicine

## 2022-10-01 MED ORDER — NIRMATRELVIR/RITONAVIR (PAXLOVID)TABLET: 3.0000 | ORAL_TABLET | Freq: Two times a day (BID) | ORAL | 0 refills | Status: AC

## 2022-10-01 NOTE — Progress Notes (Deleted)
Acute Office Visit  Subjective:     Patient ID: Felicia [name], female    DOB: Dec 20, 1965, 56 y.o.   MRN: [id number]  No chief complaint on file.   HPI Patient is in today for ***    Past Medical History:  Diagnosis Date   Allergy    year around   Anemia    Anxiety    Arthritis    Asthma    Atrial fibrillation (HCC)    Bipolar 1 disorder (HCC)    Borderline diabetes    Depression    Early cataract    right   Ectopic pregnancy    Fatty liver    Headache    Heart murmur    Hyperlipidemia    Insomnia    Peripheral arterial disease (HCC)    Pneumonia    PUD (peptic ulcer disease)    Right knee pain 12-05-2015   per pt.  she fell out of her husband's truck and landed on her right knee   Shortness of breath dyspnea    Wears glasses    Wears partial dentures    Past Surgical History:  Procedure Laterality Date   ABDOMINAL AORTAGRAM N/A 09/12/2012   Procedure: ABDOMINAL Ronny Flurry;  Surgeon: Fransisco Hertz, MD;  Location: [facility] CATH LAB;  Service: Cardiovascular;  Laterality: N/A;   AORTA - BILATERAL FEMORAL ARTERY BYPASS GRAFT N/A 10/23/2014   Procedure: AORTOBIFEMORAL BYPASS GRAFT;  Surgeon: Chuck Hint, MD;  Location: [facility] OR;  Service: Vascular;  Laterality: N/A;   BACK SURGERY     CESAREAN SECTION     COLONOSCOPY  01-14-2004   > 11 yr ago Dr Loreta Ave - normal - report in epic    DILATATION & CURETTAGE/HYSTEROSCOPY WITH MYOSURE N/A 10/15/2015   Procedure: DILATATION & CURETTAGE/HYSTEROSCOPY WITH MYOSURE;  Surgeon: Ok Edwards, MD;  Location: [facility] ORS;  Service: Gynecology;  Laterality: N/A;   ESOPHAGOGASTRODUODENOSCOPY  01-14-2004   gastritis- dr Loreta Ave- in epic already    [facility] HERNIA REPAIR N/A 08/01/2021   Procedure: INCISIONAL HERNIA REPAIR TIMES TWO, ONE WITH VENTRALEX ST HERNIA MESH, THE SECOND BY PRIMARY CLOSURE;  Surgeon: Lucretia Roers, MD;  Location: [facility] ORS;  Service: General;  Laterality: N/A;   MULTIPLE TOOTH EXTRACTIONS     NOSE SURGERY      NOSE SURGERY     SHOULDER ARTHROSCOPY WITH SUBACROMIAL DECOMPRESSION, ROTATOR CUFF REPAIR AND BICEP TENDON REPAIR Left 09/26/2019   Procedure: left shoulder arthroscopy, biceps release and tenodesis, mini open rotator cuff tear repair subscapularis and supraspinatus;  Surgeon: Cammy Copa, MD;  Location: [facility] OR;  Service: Orthopedics;  Laterality: Left;   SHOULDER SURGERY     rotator cuff repair    TONSILLECTOMY     TUBAL LIGATION     UPPER GASTROINTESTINAL ENDOSCOPY  01-14-2004   in epic- dr Loreta Ave- gastritis    Current Outpatient Medications on File Prior to Visit  Medication Sig Dispense Refill   albuterol (VENTOLIN HFA) 108 (90 Base) MCG/ACT inhaler INHALE 2 PUFFS INTO THE LUNGS EVERY 6 HOURS AS NEEDED FOR WHEEZE/SHORTNES OF BREATH 18 each 3   aspirin EC 325 MG tablet Take 325 mg by mouth daily.     atorvastatin (LIPITOR) 80 MG tablet Take 1 tablet (80 mg total) by mouth daily. 90 tablet 3   clonazePAM (KLONOPIN) 1 MG tablet Take 1 tablet (1 mg total) by mouth 3 (three) times daily. 90 tablet 3   cyclobenzaprine (FLEXERIL) 10 MG tablet TAKE 1  TABLET BY MOUTH THREE TIMES A DAY AS NEEDED FOR MUSCLE SPASM 90 tablet 1   dicyclomine (BENTYL) 20 MG tablet TAKE 1 TABLET (20 MG TOTAL) BY MOUTH 3 (THREE) TIMES DAILY WITH MEALS AS NEEDED FOR SPASMS. 20 tablet 0   diphenhydrAMINE (BENADRYL) 25 MG tablet Take 50 mg by mouth every 6 (six) hours as needed for allergies.     EPIPEN 2-PAK 0.3 MG/0.3ML SOAJ injection USE AS DIRECTED (Patient taking differently: Inject 0.3 mg into the muscle as needed for anaphylaxis.) 2 Device 0   ezetimibe (ZETIA) 10 MG tablet Take 1 tablet (10 mg total) by mouth daily. 90 tablet 3   hydrochlorothiazide (HYDRODIURIL) 25 MG tablet Take 1 tablet (25 mg total) by mouth daily. 90 tablet 3   meloxicam (MOBIC) 15 MG tablet Take 1 tablet (15 mg total) by mouth daily. 90 tablet 3   metoprolol succinate (TOPROL-XL) 50 MG 24 hr tablet Take 1 tablet (50 mg total) by mouth daily.  Take with or immediately following a meal. 90 tablet 3   ondansetron (ZOFRAN) 4 MG tablet Take 1 tablet (4 mg total) by mouth every 8 (eight) hours as needed for nausea or vomiting. 20 tablet 0   potassium chloride SA (KLOR-CON M) 20 MEQ tablet Take 2 tablets (40 mEq total) by mouth daily. 180 tablet 1   predniSONE (DELTASONE) 20 MG tablet 3 tabs poqday 1-2, 2 tabs poqday 3-4, 1 tab poqday 5-6 12 tablet 0   traMADol (ULTRAM) 50 MG tablet TAKE 1 TABLET BY MOUTH EVERY 6 HOURS AS NEEDED. (INS PAYS FOR 30 DAYS) 120 tablet 1   No current facility-administered medications on file prior to visit.   Allergies  Allergen Reactions   Bee Pollen Swelling and Other (See Comments)    Also ticks    Bactrim [Sulfamethoxazole-Trimethoprim] Hives   Cephalosporins Hives   Doxycycline Hives   Tetracyclines & Related Hives   Other Itching    Nuts causes itching and hives not sure what type   Augmentin [Amoxicillin-Pot Clavulanate] Diarrhea   Codeine Nausea Only   Reflex Pain Relief [Menthol] Rash    Muscle Rub    ROS      Objective:    LMP 12/26/2012  {Vitals History (Optional):23777}  Physical Exam  No results found for any visits on 10/01/22.      Assessment & Plan:   Problem List Items Addressed This Visit   None   No orders of the defined types were placed in this encounter.   No follow-ups on file.  Park Meo, FNP

## 2022-10-01 NOTE — Telephone Encounter (Signed)
Patient called to report positive COVID test morning of 11/8 - feeling terrible (body aches, fever, congestion, sneezing, coughing). Patient requesting Paxlovid be sent to her pharmacy.   Pharm[facility]   [id number] [place], [place] - [address] [place] [address] Phone: [phone]  Fax: [phone] DEA #: [id number]    Please advise at [phone].

## 2022-10-19 ENCOUNTER — Encounter: Payer: Self-pay | Admitting: Family Medicine

## 2022-10-19 ENCOUNTER — Ambulatory Visit: Payer: Medicare PPO | Admitting: Family Medicine

## 2022-10-19 VITALS — BP 128/72 | HR 114 | Ht <= 58 in | Wt 136.0 lb

## 2022-10-19 DIAGNOSIS — R310 Gross hematuria: Secondary | ICD-10-CM | POA: Diagnosis not present

## 2022-10-19 DIAGNOSIS — M5431 Sciatica, right side: Secondary | ICD-10-CM

## 2022-10-19 DIAGNOSIS — R300 Dysuria: Secondary | ICD-10-CM | POA: Diagnosis not present

## 2022-10-19 DIAGNOSIS — J441 Chronic obstructive pulmonary disease with (acute) exacerbation: Secondary | ICD-10-CM

## 2022-10-19 DIAGNOSIS — N3289 Other specified disorders of bladder: Secondary | ICD-10-CM | POA: Diagnosis not present

## 2022-10-19 LAB — URINALYSIS, ROUTINE W REFLEX MICROSCOPIC
Bilirubin Urine: NEGATIVE
Glucose, UA: NEGATIVE
Hyaline Cast: NONE SEEN /LPF
Ketones, ur: NEGATIVE
Leukocytes,Ua: NEGATIVE
Nitrite: POSITIVE — AB
Specific Gravity, Urine: 1.025 (ref 1.001–1.035)
pH: 5.5 (ref 5.0–8.0)

## 2022-10-19 LAB — MICROSCOPIC MESSAGE

## 2022-10-19 MED ORDER — PREDNISONE 20 MG PO TABS: ORAL_TABLET | ORAL | 0 refills | Status: DC

## 2022-10-19 MED ORDER — CYCLOBENZAPRINE HCL 10 MG PO TABS: 10.0000 mg | ORAL_TABLET | Freq: Three times a day (TID) | ORAL | 1 refills | Status: DC | PRN

## 2022-10-19 MED ORDER — CIPROFLOXACIN HCL 500 MG PO TABS: 500.0000 mg | ORAL_TABLET | Freq: Two times a day (BID) | ORAL | 0 refills | Status: AC

## 2022-10-19 MED ORDER — CLONAZEPAM 1 MG PO TABS: 1.0000 mg | ORAL_TABLET | Freq: Three times a day (TID) | ORAL | 3 refills | Status: DC

## 2022-10-19 NOTE — Progress Notes (Signed)
Subjective:     Patient ID: Felicia [name], female   DOB: 02-May-1966, 56 y.o.   MRN: [id number]  Patient has 2 concerns today.  First she states that ever since August she has been having cramping pelvic pain.  The pain is located in her bladder.  She also notices blood colored urine.  The pain improves when she urinates.  She reports increasing frequency.  This has been going on on a daily basis for the last 3 months.  She denies any dysuria however.  She denies any burning or stinging when she urinates.  Urinalysis today shows +3 blood, +2 protein, positive nitrates, negative leukocyte esterase.  She denies any right flank pain.  She denies any fevers or chills or nausea or vomiting however she states that this has been going on now for 3 months.  She also reports cough and wheezing.  She had COVID earlier in November.  Since that time she reports chest congestion temporarily relieved with albuterol.  Today on examination she has diffuse wheezing in all 4 lung fields and also diminished breath sounds bilaterally. Past Medical History:  Diagnosis Date   Allergy    year around   Anemia    Anxiety    Arthritis    Asthma    Atrial fibrillation (HCC)    Bipolar 1 disorder (HCC)    Borderline diabetes    Depression    Early cataract    right   Ectopic pregnancy    Fatty liver    Headache    Heart murmur    Hyperlipidemia    Insomnia    Peripheral arterial disease (HCC)    Pneumonia    PUD (peptic ulcer disease)    Right knee pain 12-05-2015   per pt.  she fell out of her husband's truck and landed on her right knee   Shortness of breath dyspnea    Wears glasses    Wears partial dentures    Past Surgical History:  Procedure Laterality Date   ABDOMINAL AORTAGRAM N/A 09/12/2012   Procedure: ABDOMINAL Ronny Flurry;  Surgeon: Fransisco Hertz, MD;  Location: [facility] CATH LAB;  Service: Cardiovascular;  Laterality: N/A;   AORTA - BILATERAL FEMORAL ARTERY BYPASS GRAFT N/A 10/23/2014   Procedure:  AORTOBIFEMORAL BYPASS GRAFT;  Surgeon: Chuck Hint, MD;  Location: [facility] OR;  Service: Vascular;  Laterality: N/A;   BACK SURGERY     CESAREAN SECTION     COLONOSCOPY  01-14-2004   > 11 yr ago Dr Loreta Ave - normal - report in epic    DILATATION & CURETTAGE/HYSTEROSCOPY WITH MYOSURE N/A 10/15/2015   Procedure: DILATATION & CURETTAGE/HYSTEROSCOPY WITH MYOSURE;  Surgeon: Ok Edwards, MD;  Location: [facility] ORS;  Service: Gynecology;  Laterality: N/A;   ESOPHAGOGASTRODUODENOSCOPY  01-14-2004   gastritis- dr Loreta Ave- in epic already    [facility] HERNIA REPAIR N/A 08/01/2021   Procedure: INCISIONAL HERNIA REPAIR TIMES TWO, ONE WITH VENTRALEX ST HERNIA MESH, THE SECOND BY PRIMARY CLOSURE;  Surgeon: Lucretia Roers, MD;  Location: [facility] ORS;  Service: General;  Laterality: N/A;   MULTIPLE TOOTH EXTRACTIONS     NOSE SURGERY     NOSE SURGERY     SHOULDER ARTHROSCOPY WITH SUBACROMIAL DECOMPRESSION, ROTATOR CUFF REPAIR AND BICEP TENDON REPAIR Left 09/26/2019   Procedure: left shoulder arthroscopy, biceps release and tenodesis, mini open rotator cuff tear repair subscapularis and supraspinatus;  Surgeon: Cammy Copa, MD;  Location: [facility] OR;  Service: Orthopedics;  Laterality: Left;  SHOULDER SURGERY     rotator cuff repair    TONSILLECTOMY     TUBAL LIGATION     UPPER GASTROINTESTINAL ENDOSCOPY  01-14-2004   in epic- dr Loreta Ave- gastritis    Current Outpatient Medications on File Prior to Visit  Medication Sig Dispense Refill   albuterol (VENTOLIN HFA) 108 (90 Base) MCG/ACT inhaler INHALE 2 PUFFS INTO THE LUNGS EVERY 6 HOURS AS NEEDED FOR WHEEZE/SHORTNES OF BREATH 18 each 3   aspirin EC 325 MG tablet Take 325 mg by mouth daily.     atorvastatin (LIPITOR) 80 MG tablet Take 1 tablet (80 mg total) by mouth daily. 90 tablet 3   clonazePAM (KLONOPIN) 1 MG tablet Take 1 tablet (1 mg total) by mouth 3 (three) times daily. 90 tablet 3   cyclobenzaprine (FLEXERIL) 10 MG tablet TAKE 1 TABLET BY MOUTH THREE  TIMES A DAY AS NEEDED FOR MUSCLE SPASM 90 tablet 1   dicyclomine (BENTYL) 20 MG tablet TAKE 1 TABLET (20 MG TOTAL) BY MOUTH 3 (THREE) TIMES DAILY WITH MEALS AS NEEDED FOR SPASMS. 20 tablet 0   diphenhydrAMINE (BENADRYL) 25 MG tablet Take 50 mg by mouth every 6 (six) hours as needed for allergies.     EPIPEN 2-PAK 0.3 MG/0.3ML SOAJ injection USE AS DIRECTED (Patient taking differently: Inject 0.3 mg into the muscle as needed for anaphylaxis.) 2 Device 0   ezetimibe (ZETIA) 10 MG tablet Take 1 tablet (10 mg total) by mouth daily. 90 tablet 3   hydrochlorothiazide (HYDRODIURIL) 25 MG tablet Take 1 tablet (25 mg total) by mouth daily. 90 tablet 3   meloxicam (MOBIC) 15 MG tablet Take 1 tablet (15 mg total) by mouth daily. 90 tablet 3   metoprolol succinate (TOPROL-XL) 50 MG 24 hr tablet Take 1 tablet (50 mg total) by mouth daily. Take with or immediately following a meal. 90 tablet 3   ondansetron (ZOFRAN) 4 MG tablet Take 1 tablet (4 mg total) by mouth every 8 (eight) hours as needed for nausea or vomiting. 20 tablet 0   potassium chloride SA (KLOR-CON M) 20 MEQ tablet Take 2 tablets (40 mEq total) by mouth daily. 180 tablet 1   traMADol (ULTRAM) 50 MG tablet TAKE 1 TABLET BY MOUTH EVERY 6 HOURS AS NEEDED. (INS PAYS FOR 30 DAYS) 120 tablet 1   No current facility-administered medications on file prior to visit.   Allergies  Allergen Reactions   Bee Pollen Swelling and Other (See Comments)    Also ticks    Bactrim [Sulfamethoxazole-Trimethoprim] Hives   Cephalosporins Hives   Doxycycline Hives   Tetracyclines & Related Hives   Other Itching    Nuts causes itching and hives not sure what type   Augmentin [Amoxicillin-Pot Clavulanate] Diarrhea   Codeine Nausea Only   Reflex Pain Relief [Menthol] Rash    Muscle Rub   Social History   Socioeconomic History   Marital status: Married    Spouse name: Loraine Leriche   Number of children: 2   Years of education: Not on file   Highest education level:  Not on file  Occupational History   Not on file  Tobacco Use   Smoking status: Every Day    Packs/day: 0.50    Years: 30.00    Total pack years: 15.00    Types: Cigarettes    Start date: 01/29/1981   Smokeless tobacco: Never  Vaping Use   Vaping Use: Never used  Substance and Sexual Activity   Alcohol use: Yes  Alcohol/week: 1.0 standard drink of alcohol    Types: 1 Shots of liquor per week    Comment: very occassionally on social occassions   Drug use: No   Sexual activity: Yes  Other Topics Concern   Not on file  Social History Narrative   Married 38 years in 2022.   2 daughters   4 grandsons, 1 granddaughter   Social Determinants of Health   Financial Resource Strain: Low Risk  (12/12/2021)   Overall Financial Resource Strain (CARDIA)    Difficulty of Paying Living Expenses: Not very hard  Food Insecurity: No Food Insecurity (12/12/2021)   Hunger Vital Sign    Worried About Running Out of Food in the Last Year: Never true    Ran Out of Food in the Last Year: Never true  Transportation Needs: No Transportation Needs (12/12/2021)   PRAPARE - Administrator, Civil Service (Medical): No    Lack of Transportation (Non-Medical): No  Physical Activity: Insufficiently Active (12/12/2021)   Exercise Vital Sign    Days of Exercise per Week: 5 days    Minutes of Exercise per Session: 20 min  Stress: No Stress Concern Present (12/12/2021)   [employer] of Occupational Health - Occupational Stress Questionnaire    Feeling of Stress : Only a little  Social Connections: Moderately Integrated (12/12/2021)   Social Connection and Isolation Panel [NHANES]    Frequency of Communication with Friends and Family: More than three times a week    Frequency of Social Gatherings with Friends and Family: More than three times a week    Attends Religious Services: 1 to 4 times per year    Active Member of [employer] or Organizations: No    Attends Banker Meetings:  Never    Marital Status: Married  Catering manager Violence: Not At Risk (12/12/2021)   Humiliation, Afraid, Rape, and Kick questionnaire    Fear of Current or Ex-Partner: No    Emotionally Abused: No    Physically Abused: No    Sexually Abused: No    Review of Systems  All other systems reviewed and are negative.      Objective:   Physical Exam Vitals reviewed. Exam conducted with a chaperone present.  Constitutional:      General: She is not in acute distress.    Appearance: Normal appearance. She is well-developed and normal weight. She is not ill-appearing, toxic-appearing or diaphoretic.  HENT:     Right Ear: Tympanic membrane and ear canal normal.     Left Ear: Tympanic membrane and ear canal normal.     Nose: Nose normal.     Mouth/Throat:     Mouth: Mucous membranes are dry.  Eyes:     Conjunctiva/sclera: Conjunctivae normal.  Cardiovascular:     Heart sounds: Normal heart sounds. No murmur heard.    No friction rub. No gallop.  Pulmonary:     Effort: Pulmonary effort is normal. No respiratory distress.     Breath sounds: Decreased air movement present. No stridor. Decreased breath sounds and wheezing present. No rhonchi or rales.  Abdominal:     General: A surgical scar is present. Bowel sounds are normal. There is no distension. There are no signs of injury.     Palpations: Abdomen is soft. There is no shifting dullness, hepatomegaly, splenomegaly or mass.     Tenderness: There is no abdominal tenderness. There is no rebound.     Hernia: No hernia is present.  Genitourinary:  Exam position: Lithotomy position.     Vagina: No vaginal discharge.     Cervix: No cervical motion tenderness.     Uterus: Normal.      Adnexa: Right adnexa normal and left adnexa normal.       Right: No mass.         Left: No mass.    Musculoskeletal:     Lumbar back: Spasms, tenderness and bony tenderness present. No swelling or edema. Decreased range of motion. Negative right  straight leg raise test and negative left straight leg raise test.       Back:  Skin:    Findings: No rash.  Neurological:     Mental Status: She is alert. Mental status is at baseline.     Sensory: Sensation is intact. No sensory deficit.     Coordination: Coordination is intact. Coordination normal.     Gait: Gait is intact. Gait normal.     Deep Tendon Reflexes: Reflexes normal.        Assessment:     Bladder spasm - Plan: Urinalysis, Routine w reflex microscopic  Gross hematuria - Plan: CT Abdomen Pelvis W Contrast, Urine Culture  COPD exacerbation (HCC)  Plan:    Patient seems to be having daily bladder spasms which I believe is causing her urinary frequency and urgency and she is also having gross hematuria.  This has been going on now for 3 months.  I believe a urinary tract infection is unlikely however will send a urine culture.  Given the nitrates I will also treat her empirically with Cipro which would also cover a COPD exacerbation 500 mg twice daily for 7 days.  I am also going to schedule the patient for a CT scan of the abdomen and pelvis given the gross hematuria causing more concerned about a lesion in the bladder causing bladder irritability and bladder spasms along with gross hematuria.  Also on the differential diagnosis would be nephrolithiasis.  Patient is also having a COPD exacerbation so we will treat her with a prednisone taper pack in addition to the Cipro.

## 2022-10-22 ENCOUNTER — Other Ambulatory Visit: Payer: Self-pay | Admitting: Family Medicine

## 2022-10-22 LAB — URINE CULTURE
MICRO NUMBER:: 14233428
SPECIMEN QUALITY:: ADEQUATE

## 2022-11-20 ENCOUNTER — Ambulatory Visit
Admission: RE | Admit: 2022-11-20 | Discharge: 2022-11-20 | Disposition: A | Discharge status: Home / Self Care | Payer: Medicare PPO | Source: Ambulatory Visit | Attending: Family Medicine | Admitting: Family Medicine

## 2022-11-20 DIAGNOSIS — R310 Gross hematuria: Secondary | ICD-10-CM

## 2022-11-20 DIAGNOSIS — R319 Hematuria, unspecified: Secondary | ICD-10-CM | POA: Diagnosis not present

## 2022-11-20 DIAGNOSIS — K439 Ventral hernia without obstruction or gangrene: Secondary | ICD-10-CM | POA: Diagnosis not present

## 2022-11-20 MED ORDER — IOPAMIDOL (ISOVUE-300) INJECTION 61%
100.0000 mL | Freq: Once | INTRAVENOUS | Status: AC | PRN
  Administered 2022-11-20: 100 mL via INTRAVENOUS

## 2022-11-25 ENCOUNTER — Other Ambulatory Visit: Payer: Self-pay

## 2022-11-25 DIAGNOSIS — R319 Hematuria, unspecified: Secondary | ICD-10-CM

## 2022-12-01 ENCOUNTER — Other Ambulatory Visit: Payer: Self-pay | Admitting: Family Medicine

## 2022-12-01 DIAGNOSIS — Z1231 Encounter for screening mammogram for malignant neoplasm of breast: Secondary | ICD-10-CM

## 2022-12-15 ENCOUNTER — Inpatient Hospital Stay: Admission: RE | Admit: 2022-12-15 | Payer: Medicare PPO | Source: Ambulatory Visit

## 2022-12-21 ENCOUNTER — Telehealth: Payer: Self-pay

## 2022-12-21 NOTE — Telephone Encounter (Signed)
Pt called stated that she is having a lot of pain from her shoulders, around her neck and down from her back to her rib cage. Per pt tried OTC pain meds, Alieve and heat/ice and tramadol is not working either.   Please advice?

## 2022-12-22 NOTE — Telephone Encounter (Signed)
Pls call pt per pcp"Be glad to see her to discuss her back pain."

## 2022-12-28 ENCOUNTER — Ambulatory Visit: Payer: Medicare PPO | Admitting: Family Medicine

## 2023-01-06 ENCOUNTER — Telehealth: Payer: Self-pay

## 2023-01-06 NOTE — Telephone Encounter (Signed)
Prescription Request  01/06/2023  Is this a "Controlled Substance" medicine? Yes  LOV: 10/19/22  What is the name of the medication or equipment? traMADol (ULTRAM) 50 MG  Have you contacted your pharmacy to request a refill? Yes   Which pharmacy would you like this sent to?  [id number] [id number] [id number]: [phone]: [phone]   Patient notified that their request is being sent to the clinical staff for review and that they should receive a response within 2 business days.   Please advise at Mobile [phone](mobile)

## 2023-01-07 ENCOUNTER — Other Ambulatory Visit: Payer: Self-pay | Admitting: Family Medicine

## 2023-01-07 MED ORDER — CLONAZEPAM 1 MG PO TABS: 1.0000 mg | ORAL_TABLET | Freq: Three times a day (TID) | ORAL | 3 refills | Status: DC

## 2023-01-11 NOTE — Progress Notes (Unsigned)
Subjective:   Felicia [name] is a 57 y.o. female who presents for Medicare Annual (Subsequent) preventive examination.  Review of Systems    ***       Objective:    There were no vitals filed for this visit. There is no height or weight on file to calculate BMI.     12/12/2021   12:18 PM 08/01/2021    8:13 AM 07/31/2021    9:56 AM 12/08/2018   12:27 PM 06/23/2017   11:30 AM 04/29/2016    2:45 PM 12/25/2015   11:40 AM  Advanced Directives  Does Patient Have a Medical Advance Directive? No No No No No No No  Would patient like information on creating a medical advance directive? No - Patient declined No - Patient declined No - Patient declined No - Patient declined No - Patient declined  Yes - Educational materials given    Current Medications (verified) Outpatient Encounter Medications as of 01/12/2023  Medication Sig   albuterol (VENTOLIN HFA) 108 (90 Base) MCG/ACT inhaler INHALE 2 PUFFS INTO THE LUNGS EVERY 6 HOURS AS NEEDED FOR WHEEZE/SHORTNES OF BREATH   aspirin EC 325 MG tablet Take 325 mg by mouth daily.   atorvastatin (LIPITOR) 80 MG tablet Take 1 tablet (80 mg total) by mouth daily.   clonazePAM (KLONOPIN) 1 MG tablet Take 1 tablet (1 mg total) by mouth 3 (three) times daily.   cyclobenzaprine (FLEXERIL) 10 MG tablet Take 1 tablet (10 mg total) by mouth 3 (three) times daily as needed for muscle spasms.   dicyclomine (BENTYL) 20 MG tablet TAKE 1 TABLET (20 MG TOTAL) BY MOUTH 3 (THREE) TIMES DAILY WITH MEALS AS NEEDED FOR SPASMS.   diphenhydrAMINE (BENADRYL) 25 MG tablet Take 50 mg by mouth every 6 (six) hours as needed for allergies.   EPIPEN 2-PAK 0.3 MG/0.3ML SOAJ injection USE AS DIRECTED (Patient taking differently: Inject 0.3 mg into the muscle as needed for anaphylaxis.)   ezetimibe (ZETIA) 10 MG tablet Take 1 tablet (10 mg total) by mouth daily.   hydrochlorothiazide (HYDRODIURIL) 25 MG tablet Take 1 tablet (25 mg total) by mouth daily.   KLOR-CON M20 20 MEQ tablet TAKE  2 TABLETS BY MOUTH DAILY   meloxicam (MOBIC) 15 MG tablet Take 1 tablet (15 mg total) by mouth daily.   metoprolol succinate (TOPROL-XL) 50 MG 24 hr tablet Take 1 tablet (50 mg total) by mouth daily. Take with or immediately following a meal.   ondansetron (ZOFRAN) 4 MG tablet Take 1 tablet (4 mg total) by mouth every 8 (eight) hours as needed for nausea or vomiting.   predniSONE (DELTASONE) 20 MG tablet 3 tabs poqday 1-2, 2 tabs poqday 3-4, 1 tab poqday 5-6   traMADol (ULTRAM) 50 MG tablet TAKE 1 TABLET BY MOUTH EVERY 6 HOURS AS NEEDED. (INS PAYS FOR 30 DAYS) (Patient taking differently: Take 100 mg by mouth in the morning, at noon, and at bedtime. 180/month)   No facility-administered encounter medications on file as of 01/12/2023.    Allergies (verified) Bee pollen, Bactrim [sulfamethoxazole-trimethoprim], Cephalosporins, Doxycycline, Tetracyclines & related, Other, Augmentin [amoxicillin-pot clavulanate], Codeine, and Reflex pain relief [menthol]   History: Past Medical History:  Diagnosis Date   Allergy    year around   Anemia    Anxiety    Arthritis    Asthma    Atrial fibrillation ([place])    Bipolar 1 disorder ([place])    Borderline diabetes    Depression    Early cataract  right   Ectopic pregnancy    Fatty liver    Headache    Heart murmur    Hyperlipidemia    Insomnia    Peripheral arterial disease (H[facility])    Pneumonia    PUD (peptic ulcer disease)    Right knee pain 12-05-2015   per pt.  she fell out of her husband's truck and landed on her right knee   Shortness of breath dyspnea    Wears glasses    Wears partial dentures    Past Surgical History:  Procedure Laterality Date   ABDOMINAL AORTAGRAM N/A 09/12/2012   Procedure: ABDOMINAL Maxcine Ham;  Surgeon: Conrad Cleghorn, MD;  [facility] C[facility];  Service: Cardiovascular;  Laterality: N/A;   AORTA - BILATERAL FEMORAL ARTERY BYPASS GRAFT N/A 10/23/2014   Procedure: AORTOBIFEMORAL BYPASS GRAFT;  Surgeon: Angelia Mould, MD;  Location: [place];  Service: Vascular;  Laterality: N/A;   BACK SURGERY     CESAREAN SECTION     COLONOSCOPY  01-14-2004   > 11 yr ago Dr Collene Mares - normal - report in [place] N/A 10/15/2015   Procedure: [place];  Surgeon: Terrance Mass, MD;  Location: [facility];  Service: Gynecology;  Laterality: N/A;   ESOPHAGOGASTRODUODENOSCOPY  01-14-2004   gastritis- dr Collene Mares- in epic already    [place] N/A 08/01/2021   Procedure: INCISIONAL HERNIA REPAIR TIMES TWO, ONE WITH VENTRALEX ST HERNIA MESH, THE SECOND BY PRIMARY CLOSURE;  Surgeon: Virl Cagey, MD;  Location: [facility];  Service: General;  Laterality: N/A;   MULTIPLE TOOTH EXTRACTIONS     NOSE SURGERY     NOSE SURGERY     SHOULDER ARTHROSCOPY WITH SUBACROMIAL DECOMPRESSION, ROTATOR CUFF REPAIR AND BICEP TENDON REPAIR Left 09/26/2019   Procedure: left shoulder arthroscopy, biceps release and tenodesis, mini open rotator cuff tear repair subscapularis and supraspinatus;  Surgeon: Meredith Pel, MD;  Location: [place];  Service: Orthopedics;  Laterality: Left;   SHOULDER SURGERY     rotator cuff repair    TONSILLECTOMY     TUBAL LIGATION     UPPER GASTROINTESTINAL ENDOSCOPY  01-14-2004   in epic- dr Collene Mares- gastritis    Family History  Adopted: Yes  Problem Relation Age of Onset   Heart disease Mother    Diabetes Mother    Hyperlipidemia Mother    Hypertension Mother    Heart attack Mother    Heart disease Father    Diabetes Father    Heart attack Father    Hypertension Father    Bladder Cancer Father    Diabetes Brother    Lung cancer Paternal Grandfather    Colon cancer Neg Hx    Colon polyps Neg Hx    Rectal cancer Neg Hx    Stomach cancer Neg Hx    Social History   Socioeconomic History   Marital status: Married    Spouse name: Elta Guadeloupe   Number of children: 2   Years of education: Not on file   Highest  education level: Not on file  Occupational History   Not on file  Tobacco Use   Smoking status: Every Day    Packs/day: 0.50    Years: 30.00    Total pack years: 15.00    Types: Cigarettes    Start date: 01/29/1981   Smokeless tobacco: Never  Vaping Use   Vaping Use: Never used  Substance and  Sexual Activity   Alcohol use: Yes    Alcohol/week: 1.0 standard drink of alcohol    Types: 1 Shots of liquor per week    Comment: very occassionally on social occassions   Drug use: No   Sexual activity: Yes  Other Topics Concern   Not on file  Social History Narrative   Married 63 years in 2022.   2 daughters   4 grandsons, 1 granddaughter   Social Determinants of Health   Financial Resource Strain: Low Risk  (12/12/2021)   Overall Financial Resource Strain (CARDIA)    Difficulty of Paying Living Expenses: Not very hard  Food Insecurity: No Food Insecurity (12/12/2021)   Hunger Vital Sign    Worried About Running Out of Food in the Last Year: Never true    Ran Out of Food in the Last Year: Never true  Transportation Needs: No Transportation Needs (12/12/2021)   PRAPARE - Hydrologist (Medical): No    Lack of Transportation (Non-Medical): No  Physical Activity: Insufficiently Active (12/12/2021)   Exercise Vital Sign    Days of Exercise per Week: 5 days    Minutes of Exercise per Session: 20 min  Stress: No Stress Concern Present (12/12/2021)   [place]    Feeling of Stress : Only a little  Social Connections: Moderately Integrated (12/12/2021)   Social Connection and Isolation Panel [NHANES]    Frequency of Communication with Friends and Family: More than three times a week    Frequency of Social Gatherings with Friends and Family: More than three times a week    Attends Religious Services: 1 to 4 times per year    Active Member of [employer] or Organizations: No    Attends Theatre manager Meetings: Never    Marital Status: Married    Tobacco Counseling Ready to quit: Not Answered Counseling given: Not Answered   Clinical Intake:                 Diabetic?No          Activities of Daily Living     No data to display          Patient Care Team: Susy Frizzle, MD as PCP - General (Family Medicine) Harl Bowie, Alphonse Guild, MD as PCP - Cardiology (Cardiology) Susy Frizzle, MD (Family Medicine) Vickey Huger, MD (Orthopedic Surgery) Vickey Huger, MD (Orthopedic Surgery)  Indicate any recent Medical Services you may have received from other than [facility] providers in the past year (date may be approximate).     Assessment:   This is a routine wellness examination for [place].  Hearing/Vision screen No results found.  Dietary issues and exercise activities discussed:     Goals Addressed   None    Depression Screen    10/19/2022   10:59 AM 12/12/2021   12:14 PM 05/11/2019    9:18 AM 02/02/2019   11:03 AM 08/25/2018   12:03 PM 01/11/2018    9:35 AM 12/27/2017    2:34 PM  PHQ 2/9 Scores  PHQ - 2 Score 0 1 0 5 6 0 1  PHQ- 9 Score    14 10  3    $ Fall Risk    10/19/2022   10:59 AM 12/12/2021   12:18 PM 07/08/2021    9:46 AM 01/23/2021    1:56 PM 12/20/2020    3:53 PM  Fall Risk   Falls in the  past year? 0 1 0 0 0  Number falls in past yr: 0 1   0  Injury with Fall? 0 1   0  Risk for fall due to : No Fall Risks History of fall(s);Impaired balance/gait   No Fall Risks  Follow up Falls prevention discussed Falls prevention discussed Falls evaluation completed Falls evaluation completed Falls evaluation completed    [place]:  Any stairs in or around the home? {YES/NO:21197} If so, are there any without handrails? {YES/NO:21197} Home free of loose throw rugs in walkways, pet beds, electrical cords, etc? {YES/NO:21197} Adequate lighting in your home to reduce risk of falls?  {YES/NO:21197}  ASSISTIVE DEVICES UTILIZED TO PREVENT FALLS:  Life alert? {YES/NO:21197} Use of a cane, walker or w/c? {YES/NO:21197} Grab bars in the bathroom? {YES/NO:21197} Shower chair or bench in shower? {YES/NO:21197} Elevated toilet seat or a handicapped toilet? {YES/NO:21197}  TIMED UP AND GO:  Was the test performed? No . Telephonic visit   Cognitive Function:        12/12/2021   12:29 PM  6CIT Screen  What Year? 0 points  What month? 0 points  What time? 0 points  Count back from 20 0 points  Months in reverse 0 points  Repeat phrase 0 points  Total Score 0 points    Immunizations Immunization History  Administered Date(s) Administered   Influenza,inj,Quad PF,6+ Mos 09/05/2013, 08/28/2014, 09/20/2015, 10/01/2016, 01/11/2018, 09/19/2021   Janssen (J&J) SARS-COV-2 Vaccination 07/22/2020   Pneumococcal Conjugate-13 01/11/2018   Pneumococcal Polysaccharide-23 10/27/2014, 09/19/2021    {TDAP status:2101805}  {Flu Vaccine status:2101806}  Pneumococcal vaccine status: Up to date  Covid-19 vaccine status: Information provided on how to obtain vaccines.   Qualifies for Shingles Vaccine? Yes   Zostavax completed No   Shingrix Completed?: No.    Education has been provided regarding the importance of this vaccine. Patient has been advised to call insurance company to determine out of pocket expense if they have not yet received this vaccine. Advised may also receive vaccine at local pharmacy or Health Dept. Verbalized acceptance and understanding.  Screening Tests Health Maintenance  Topic Date Due   DTaP/Tdap/Td (1 - Tdap) Never done   COVID-19 Vaccine (2 - 2023-24 season) 07/24/2022   Medicare Annual Wellness (AWV)  12/12/2022   Zoster Vaccines- Shingrix (1 of 2) 01/19/2023 (Originally 01/29/2014)   INFLUENZA VACCINE  02/21/2023 (Originally 06/23/2022)   HIV Screening  10/02/2023 (Originally 01/30/1981)   MAMMOGRAM  10/20/2023 (Originally 02/28/2022)   Hepatitis C  Screening  10/20/2023 (Originally 01/29/1982)   PAP SMEAR-Modifier  09/19/2024   COLONOSCOPY (Pts 45-63yr Insurance coverage will need to be confirmed)  05/14/2026   HPV VACCINES  Aged Out    Health Maintenance  Health Maintenance Due  Topic Date Due   DTaP/Tdap/Td (1 - Tdap) Never done   COVID-19 Vaccine (2 - 2023-24 season) 07/24/2022   Medicare Annual Wellness (AWV)  12/12/2022    Colorectal cancer screening: Type of screening: Colonoscopy. Completed 05/14/16. Repeat every 10 years  {Mammogram status:21018020}   Lung Cancer Screening: (Low Dose CT Chest recommended if Age 59-80years, 30 pack-year currently smoking OR have quit w/in 15years.) does not qualify.   Lung Cancer Screening Referral: n/a  Additional Screening:  Hepatitis C Screening: does qualify; Completed at next office visit   Vision Screening: Recommended annual ophthalmology exams for early detection of glaucoma and other disorders of the eye. Is the patient up to date with their annual eye exam?  {  YES/NO:21197} Who is the provider or what is the name of the office in which the patient attends annual eye exams? *** If pt is not established with a provider, would they like to be referred to a provider to establish care? {YES/NO:21197}.   Dental Screening: Recommended annual dental exams for proper oral hygiene  Community Resource Referral / Chronic Care Management: CRR required this visit?  {YES/NO:21197}  CCM required this visit?  {YES/NO:21197}     Plan:     I have personally reviewed and noted the following in the patient's chart:   Medical and social history Use of alcohol, tobacco or illicit drugs  Current medications and supplements including opioid prescriptions. {Opioid Prescriptions:[phone]} Functional ability and status Nutritional status Physical activity Advanced directives List of other physicians Hospitalizations, surgeries, and ER visits in previous 12 months Vitals Screenings  to include cognitive, depression, and falls Referrals and appointments  In addition, I have reviewed and discussed with patient certain preventive protocols, quality metrics, and best practice recommendations. A written personalized care plan for preventive services as well as general preventive health recommendations were provided to patient.     Vanetta Mulders, [place]   [id number]   Due to this being a virtual visit, the after visit summary with patients personalized plan was offered to patient via mail or my-chart. ***Patient declined at this time./ Patient would like to access on my-chart/ per request, patient was mailed a copy of AVS./ Patient preferred to pick up at office at next visit   Nurse Notes: ***

## 2023-01-11 NOTE — Patient Instructions (Incomplete)
Felicia [name] , Thank you for taking time to come for your Medicare Wellness Visit. I appreciate your ongoing commitment to your health goals. Please review the following plan we discussed and let me know if I can assist you in the future.   These are the goals we discussed:  Goals      DIET - REDUCE CALORIE INTAKE     Weight loss.        This is a list of the screening recommended for you and due dates:  Health Maintenance  Topic Date Due   DTaP/Tdap/Td vaccine (1 - Tdap) Never done   COVID-19 Vaccine (2 - 2023-24 season) 07/24/2022   Zoster (Shingles) Vaccine (1 of 2) 01/19/2023*   Flu Shot  02/21/2023*   HIV Screening  10/02/2023*   Mammogram  10/20/2023*   Hepatitis C Screening: USPSTF Recommendation to screen - Ages 18-79 yo.  10/20/2023*   Medicare Annual Wellness Visit  01/13/2024   Pap Smear  09/19/2024   Colon Cancer Screening  05/14/2026   HPV Vaccine  Aged Out  *Topic was postponed. The date shown is not the original due date.    Advanced directives: Advance directive discussed with you today. I have provided a copy for you to complete at home and have notarized. Once this is complete please bring a copy in to our office so we can scan it into your chart.   Conditions/risks identified: Aim for 30 minutes of exercise or brisk walking, 6-8 glasses of water, and 5 servings of fruits and vegetables each day.   Next appointment: Follow up in one year for your annual wellness visit.   Preventive Care 40-64 Years, Female Preventive care refers to lifestyle choices and visits with your health care provider that can promote health and wellness. What does preventive care include? A yearly physical exam. This is also called an annual well check. Dental exams once or twice a year. Routine eye exams. Ask your health care provider how often you should have your eyes checked. Personal lifestyle choices, including: Daily care of your teeth and gums. Regular physical  activity. Eating a healthy diet. Avoiding tobacco and drug use. Limiting alcohol use. Practicing safe sex. Taking low-dose aspirin daily starting at age 31. Taking vitamin and mineral supplements as recommended by your health care provider. What happens during an annual well check? The services and screenings done by your health care provider during your annual well check will depend on your age, overall health, lifestyle risk factors, and family history of disease. Counseling  Your health care provider may ask you questions about your: Alcohol use. Tobacco use. Drug use. Emotional well-being. Home and relationship well-being. Sexual activity. Eating habits. Work and work Statistician. Method of birth control. Menstrual cycle. Pregnancy history. Screening  You may have the following tests or measurements: Height, weight, and BMI. Blood pressure. Lipid and cholesterol levels. These may be checked every 5 years, or more frequently if you are over 31 years old. Skin check. Lung cancer screening. You may have this screening every year starting at age 80 if you have a 30-pack-year history of smoking and currently smoke or have quit within the past 15 years. Fecal occult blood test (FOBT) of the stool. You may have this test every year starting at age 2. Flexible sigmoidoscopy or colonoscopy. You may have a sigmoidoscopy every 5 years or a colonoscopy every 10 years starting at age 30. Hepatitis C blood test. Hepatitis B blood test. Sexually transmitted disease (STD) testing.  Diabetes screening. This is done by checking your blood sugar (glucose) after you have not eaten for a while (fasting). You may have this done every 1-3 years. Mammogram. This may be done every 1-2 years. Talk to your health care provider about when you should start having regular mammograms. This may depend on whether you have a family history of breast cancer. BRCA-related cancer screening. This may be done if  you have a family history of breast, ovarian, tubal, or peritoneal cancers. Pelvic exam and Pap test. This may be done every 3 years starting at age 11. Starting at age 69, this may be done every 5 years if you have a Pap test in combination with an HPV test. Bone density scan. This is done to screen for osteoporosis. You may have this scan if you are at high risk for osteoporosis. Discuss your test results, treatment options, and if necessary, the need for more tests with your health care provider. Vaccines  Your health care provider may recommend certain vaccines, such as: Influenza vaccine. This is recommended every year. Tetanus, diphtheria, and acellular pertussis (Tdap, Td) vaccine. You may need a Td booster every 10 years. Zoster vaccine. You may need this after age 55. Pneumococcal 13-valent conjugate (PCV13) vaccine. You may need this if you have certain conditions and were not previously vaccinated. Pneumococcal polysaccharide (PPSV23) vaccine. You may need one or two doses if you smoke cigarettes or if you have certain conditions. Talk to your health care provider about which screenings and vaccines you need and how often you need them. This information is not intended to replace advice given to you by your health care provider. Make sure you discuss any questions you have with your health care provider. Document Released: 12/06/2015 Document Revised: 07/29/2016 Document Reviewed: 09/10/2015 Elsevier Interactive Patient Education  2017 [address] Prevention in the Home Falls can cause injuries. They can happen to people of all ages. There are many things you can do to make your home safe and to help prevent falls. What can I do on the outside of my home? Regularly fix the edges of walkways and driveways and fix any cracks. Remove anything that might make you trip as you walk through a door, such as a raised step or threshold. Trim any bushes or trees on the path to your  home. Use bright outdoor lighting. Clear any walking paths of anything that might make someone trip, such as rocks or tools. Regularly check to see if handrails are loose or broken. Make sure that both sides of any steps have handrails. Any raised decks and porches should have guardrails on the edges. Have any leaves, snow, or ice cleared regularly. Use sand or salt on walking paths during winter. Clean up any spills in your garage right away. This includes oil or grease spills. What can I do in the bathroom? Use night lights. Install grab bars by the toilet and in the tub and shower. Do not use towel bars as grab bars. Use non-skid mats or decals in the tub or shower. If you need to sit down in the shower, use a plastic, non-slip stool. Keep the floor dry. Clean up any water that spills on the floor as soon as it happens. Remove soap buildup in the tub or shower regularly. Attach bath mats securely with double-sided non-slip rug tape. Do not have throw rugs and other things on the floor that can make you trip. What can I do in  the bedroom? Use night lights. Make sure that you have a light by your bed that is easy to reach. Do not use any sheets or blankets that are too big for your bed. They should not hang down onto the floor. Have a firm chair that has side arms. You can use this for support while you get dressed. Do not have throw rugs and other things on the floor that can make you trip. What can I do in the kitchen? Clean up any spills right away. Avoid walking on wet floors. Keep items that you use a lot in easy-to-reach places. If you need to reach something above you, use a strong step stool that has a grab bar. Keep electrical cords out of the way. Do not use floor polish or wax that makes floors slippery. If you must use wax, use non-skid floor wax. Do not have throw rugs and other things on the floor that can make you trip. What can I do with my stairs? Do not leave any  items on the stairs. Make sure that there are handrails on both sides of the stairs and use them. Fix handrails that are broken or loose. Make sure that handrails are as long as the stairways. Check any carpeting to make sure that it is firmly attached to the stairs. Fix any carpet that is loose or worn. Avoid having throw rugs at the top or bottom of the stairs. If you do have throw rugs, attach them to the floor with carpet tape. Make sure that you have a light switch at the top of the stairs and the bottom of the stairs. If you do not have them, ask someone to add them for you. What else can I do to help prevent falls? Wear shoes that: Do not have high heels. Have rubber bottoms. Are comfortable and fit you well. Are closed at the toe. Do not wear sandals. If you use a stepladder: Make sure that it is fully opened. Do not climb a closed stepladder. Make sure that both sides of the stepladder are locked into place. Ask someone to hold it for you, if possible. Clearly mark and make sure that you can see: Any grab bars or handrails. First and last steps. Where the edge of each step is. Use tools that help you move around (mobility aids) if they are needed. These include: Canes. Walkers. Scooters. Crutches. Turn on the lights when you go into a dark area. Replace any light bulbs as soon as they burn out. Set up your furniture so you have a clear path. Avoid moving your furniture around. If any of your floors are uneven, fix them. If there are any pets around you, be aware of where they are. Review your medicines with your doctor. Some medicines can make you feel dizzy. This can increase your chance of falling. Ask your doctor what other things that you can do to help prevent falls. This information is not intended to replace advice given to you by your health care provider. Make sure you discuss any questions you have with your health care provider. Document Released: 09/05/2009  Document Revised: 04/16/2016 Document Reviewed: 12/14/2014 Elsevier Interactive Patient Education  2017 [employer].

## 2023-01-12 ENCOUNTER — Ambulatory Visit (INDEPENDENT_AMBULATORY_CARE_PROVIDER_SITE_OTHER): Payer: Medicare PPO

## 2023-01-12 VITALS — Ht <= 58 in | Wt 136.0 lb

## 2023-01-12 DIAGNOSIS — Z0000 Encounter for general adult medical examination without abnormal findings: Secondary | ICD-10-CM

## 2023-01-12 DIAGNOSIS — Z1231 Encounter for screening mammogram for malignant neoplasm of breast: Secondary | ICD-10-CM

## 2023-01-14 ENCOUNTER — Ambulatory Visit: Payer: Medicare PPO | Admitting: Family Medicine

## 2023-01-14 ENCOUNTER — Encounter: Payer: Self-pay | Admitting: Family Medicine

## 2023-01-14 ENCOUNTER — Other Ambulatory Visit: Payer: Self-pay | Admitting: Family Medicine

## 2023-01-14 VITALS — BP 120/72 | HR 92 | Temp 98.2°F | Ht <= 58 in | Wt 135.0 lb

## 2023-01-14 DIAGNOSIS — M25512 Pain in left shoulder: Secondary | ICD-10-CM | POA: Diagnosis not present

## 2023-01-14 MED ORDER — TRAMADOL HCL 50 MG PO TABS: 100.0000 mg | ORAL_TABLET | Freq: Three times a day (TID) | ORAL | 0 refills | Status: DC

## 2023-01-14 NOTE — Progress Notes (Signed)
Subjective:     Patient ID: Felicia [name], female   DOB: 05/15/1966, 57 y.o.   MRN: [id number]  MRI left shoulder 2020- IMPRESSION: 1. Prior rotator cuff repair. Mild tendinosis of the supraspinatus tendon with a small full thickness fissure anteriorly. Mild Tendinosis of the infraspinatus tendon. High-grade partial-thickness tear of the subscapularis tendon. 2. Atrophy of the subscapularis muscle.  3 weeks ago, the patient was trying to step over a gate.  She tripped and hung her foot on the gait falling and landing directly on the tip of her shoulder.  Since that time has had aching pain in the shoulder.  Report pain acromion process she is unable to abduct her arm greater than 80 degrees actively.  With passive abduction greater than 90 degrees she has significant pain.  Drop testing produces significant pain.  Cross body testing also elicits pain in the [facility] joint.   Past Medical History:  Diagnosis Date   Allergy    year around   Anemia    Anxiety    Arthritis    Asthma    Atrial fibrillation ([place])    Bipolar 1 disorder (HCC)    Borderline diabetes    Depression    Early cataract    right   Ectopic pregnancy    Fatty liver    Headache    Heart murmur    Hyperlipidemia    Insomnia    Peripheral arterial disease (HCC)    Pneumonia    PUD (peptic ulcer disease)    Right knee pain 12-05-2015   per pt.  she fell out of her husband's truck and landed on her right knee   Shortness of breath dyspnea    Wears glasses    Wears partial dentures    Past Surgical History:  Procedure Laterality Date   ABDOMINAL AORTAGRAM N/A 09/12/2012   Procedure: ABDOMINAL Maxcine Ham;  Surgeon: Conrad Monterey, MD;  Location: [facility] CATH LAB;  Service: Cardiovascular;  Laterality: N/A;   AORTA - BILATERAL FEMORAL ARTERY BYPASS GRAFT N/A 10/23/2014   Procedure: AORTOBIFEMORAL BYPASS GRAFT;  Surgeon: Angelia Mould, MD;  Location: [place];  Service: Vascular;  Laterality: N/A;   BACK SURGERY      CESAREAN SECTION     COLONOSCOPY  01-14-2004   > 11 yr ago Dr Collene Mares - normal - report in [place] N/A 10/15/2015   Procedure: [place];  Surgeon: Terrance Mass, MD;  Location: [facility];  Service: Gynecology;  Laterality: N/A;   ESOPHAGOGASTRODUODENOSCOPY  01-14-2004   gastritis- dr Collene Mares- in epic already    [place] N/A 08/01/2021   Procedure: INCISIONAL HERNIA REPAIR TIMES TWO, ONE WITH VENTRALEX ST HERNIA MESH, THE SECOND BY PRIMARY CLOSURE;  Surgeon: Virl Cagey, MD;  Location: [facility];  Service: General;  Laterality: N/A;   MULTIPLE TOOTH EXTRACTIONS     NOSE SURGERY     NOSE SURGERY     SHOULDER ARTHROSCOPY WITH SUBACROMIAL DECOMPRESSION, ROTATOR CUFF REPAIR AND BICEP TENDON REPAIR Left 09/26/2019   Procedure: left shoulder arthroscopy, biceps release and tenodesis, mini open rotator cuff tear repair subscapularis and supraspinatus;  Surgeon: Meredith Pel, MD;  Location: [place];  Service: Orthopedics;  Laterality: Left;   SHOULDER SURGERY     rotator cuff repair    TONSILLECTOMY     TUBAL LIGATION     UPPER GASTROINTESTINAL ENDOSCOPY  01-14-2004   in epic- dr  mann- gastritis    Current Outpatient Medications on File Prior to Visit  Medication Sig Dispense Refill   albuterol (VENTOLIN HFA) 108 (90 Base) MCG/ACT inhaler INHALE 2 PUFFS INTO THE LUNGS EVERY 6 HOURS AS NEEDED FOR WHEEZE/SHORTNES OF BREATH 18 each 3   aspirin EC 325 MG tablet Take 325 mg by mouth daily.     atorvastatin (LIPITOR) 80 MG tablet Take 1 tablet (80 mg total) by mouth daily. 90 tablet 3   clonazePAM (KLONOPIN) 1 MG tablet Take 1 tablet (1 mg total) by mouth 3 (three) times daily. 90 tablet 3   cyclobenzaprine (FLEXERIL) 10 MG tablet Take 1 tablet (10 mg total) by mouth 3 (three) times daily as needed for muscle spasms. 90 tablet 1   diphenhydrAMINE (BENADRYL) 25 MG tablet Take 50 mg by mouth every 6 (six)  hours as needed for allergies.     EPIPEN 2-PAK 0.3 MG/0.3ML SOAJ injection USE AS DIRECTED (Patient taking differently: Inject 0.3 mg into the muscle as needed for anaphylaxis.) 2 Device 0   ezetimibe (ZETIA) 10 MG tablet Take 1 tablet (10 mg total) by mouth daily. 90 tablet 3   hydrochlorothiazide (HYDRODIURIL) 25 MG tablet Take 1 tablet (25 mg total) by mouth daily. 90 tablet 3   KLOR-CON M20 20 MEQ tablet TAKE 2 TABLETS BY MOUTH DAILY 180 tablet 1   meloxicam (MOBIC) 15 MG tablet Take 1 tablet (15 mg total) by mouth daily. 90 tablet 3   metoprolol succinate (TOPROL-XL) 50 MG 24 hr tablet Take 1 tablet (50 mg total) by mouth daily. Take with or immediately following a meal. 90 tablet 3   traMADol (ULTRAM) 50 MG tablet TAKE 1 TABLET BY MOUTH EVERY 6 HOURS AS NEEDED. (INS PAYS FOR 30 DAYS) (Patient taking differently: Take 100 mg by mouth in the morning, at noon, and at bedtime. 180/month) 120 tablet 1   No current facility-administered medications on file prior to visit.   Allergies  Allergen Reactions   Bee Pollen Swelling and Other (See Comments)    Also ticks    Bactrim [Sulfamethoxazole-Trimethoprim] Hives   Cephalosporins Hives   Doxycycline Hives   Tetracyclines & Related Hives   Other Itching    Nuts causes itching and hives not sure what type   Augmentin [Amoxicillin-Pot Clavulanate] Diarrhea   Codeine Nausea Only   Reflex Pain Relief [Menthol] Rash    Muscle Rub   Social History   Socioeconomic History   Marital status: Married    Spouse name: Elta Guadeloupe   Number of children: 2   Years of education: Not on file   Highest education level: Not on file  Occupational History   Not on file  Tobacco Use   Smoking status: Every Day    Packs/day: 0.50    Years: 30.00    Total pack years: 15.00    Types: Cigarettes    Start date: 01/29/1981   Smokeless tobacco: Never  Vaping Use   Vaping Use: Never used  Substance and Sexual Activity   Alcohol use: Yes    Alcohol/week: 1.0  standard drink of alcohol    Types: 1 Shots of liquor per week    Comment: very occassionally on social occassions   Drug use: No   Sexual activity: Yes  Other Topics Concern   Not on file  Social History Narrative   Married 38 years in 2022.   2 daughters   4 grandsons, 1 granddaughter   Social Determinants of Health  Financial Resource Strain: Low Risk  (01/12/2023)   Overall Financial Resource Strain (CARDIA)    Difficulty of Paying Living Expenses: Not hard at all  Food Insecurity: No Food Insecurity (01/12/2023)   Hunger Vital Sign    Worried About Running Out of Food in the Last Year: Never true    Ran Out of Food in the Last Year: Never true  Transportation Needs: No Transportation Needs (01/12/2023)   PRAPARE - Hydrologist (Medical): No    Lack of Transportation (Non-Medical): No  Physical Activity: Insufficiently Active (01/12/2023)   Exercise Vital Sign    Days of Exercise per Week: 3 days    Minutes of Exercise per Session: 30 min  Stress: No Stress Concern Present (01/12/2023)   [place]    Feeling of Stress : Not at all  Social Connections: Moderately Integrated (01/12/2023)   Social Connection and Isolation Panel [NHANES]    Frequency of Communication with Friends and Family: More than three times a week    Frequency of Social Gatherings with Friends and Family: Three times a week    Attends Religious Services: 1 to 4 times per year    Active Member of Clubs or Organizations: No    Attends Archivist Meetings: Never    Marital Status: Married  Human resources officer Violence: Not At Risk (01/12/2023)   Humiliation, Afraid, Rape, and Kick questionnaire    Fear of Current or Ex-Partner: No    Emotionally Abused: No    Physically Abused: No    Sexually Abused: No    Review of Systems  All other systems reviewed and are negative.      Objective:   Physical  Exam Vitals reviewed. Exam conducted with a chaperone present.  Constitutional:      General: She is not in acute distress.    Appearance: Normal appearance. She is well-developed and normal weight. She is not ill-appearing, toxic-appearing or diaphoretic.  Cardiovascular:     Heart sounds: Normal heart sounds. No murmur heard.    No friction rub. No gallop.  Pulmonary:     Effort: Pulmonary effort is normal. No respiratory distress.     Breath sounds: Decreased air movement present. No stridor. Decreased breath sounds present. No wheezing, rhonchi or rales.  Abdominal:     General: A surgical scar is present. There are no signs of injury.     Palpations: There is no shifting dullness, hepatomegaly or splenomegaly.  Genitourinary:    Exam position: Lithotomy position.     Cervix: No cervical motion tenderness.     Uterus: Normal.      Adnexa: Right adnexa normal and left adnexa normal.       Right: No mass.         Left: No mass.    Musculoskeletal:     Left shoulder: Tenderness and bony tenderness present. No swelling, deformity or effusion. Decreased range of motion. Decreased strength.  Neurological:     Mental Status: She is alert.     Sensory: Sensation is intact.     Coordination: Coordination is intact.     Gait: Gait is intact.        Assessment:     Acute pain of left shoulder - Plan: DG Shoulder Left   Plan:   I am very concerned, that the patient may have injured the supraspinatus tendon.  Begin by obtaining an x-ray to ensure that there  has not been a fracture of the shoulder itself.  If x-ray is normal, would recommend therapy with Jen shoulder.  Using sterile technique, I injected the subacromial space on the left side with 2 cc of lidocaine, 2 cc of Marcaine, and 2 cc of 40 mg/mL Kenalog.  She tolerated the procedure well without complication.  If the pain does not improve after the cortisone injection, the neck step would be to proceed with an MRI to see if she  is injured the supraspinatus tendon during the fall

## 2023-01-14 NOTE — Telephone Encounter (Signed)
Prescription Request  01/14/2023  Is this a "Controlled Substance" medicine? Yes  LOV: 01/14/2023  What is the name of the medication or equipment? traMADol (ULTRAM) 50 MG tablet [id number]  Have you contacted your pharmacy to request a refill? No   Which pharmacy would you like this sent to?  [id number] [address]: [phone]: [phone]   Patient notified that their request is being sent to the clinical staff for review and that they should receive a response within 2 business days.   Please advise at [phone]

## 2023-01-19 ENCOUNTER — Other Ambulatory Visit: Payer: Self-pay | Admitting: Family Medicine

## 2023-01-19 DIAGNOSIS — M5431 Sciatica, right side: Secondary | ICD-10-CM

## 2023-01-19 NOTE — Telephone Encounter (Signed)
Prescription Request  01/19/2023  Is this a "Controlled Substance" medicine? No   LOV: 01/14/2023  What is the name of the medication or equipment?   cyclobenzaprine (FLEXERIL) 10 MG tablet   Have you contacted your pharmacy to request a refill? Yes    Which pharmacy would you like this sent to?  [id number]: [phone]: [phone]   Patient notified that their request is being sent to the clinical staff for review and that they should receive a response within 2 business days.   Please advise pharmacist at [phone]

## 2023-01-19 NOTE — Telephone Encounter (Signed)
Requested medication (s) are due for refill today: yes  Requested medication (s) are on the active medication list: yes  Last refill:  09/07/22 #90 1 RF  Future visit scheduled: no  Notes to clinic:  med not delegated to NT to RF   Requested Prescriptions  Pending Prescriptions Disp Refills   cyclobenzaprine (FLEXERIL) 10 MG tablet 90 tablet 1    Sig: Take 1 tablet (10 mg total) by mouth 3 (three) times daily as needed for muscle spasms.     Not Delegated - Analgesics:  Muscle Relaxants Failed - 01/19/2023 11:08 AM      Failed - This refill cannot be delegated      Failed - Valid encounter within last 6 months    Recent Outpatient Visits           1 year ago Acute cystitis with hematuria   [place] Eulogio Bear, NP   1 year ago General medical exam   [place] Susy Frizzle, MD   1 year ago Acute midline low back pain with right-sided sciatica   [place] Susy Frizzle, MD   2 years ago Ventral hernia without obstruction or gangrene   [place] Susy Frizzle, MD   2 years ago Rib pain on left side   [place] Pickard, Cammie Mcgee, MD

## 2023-01-22 MED ORDER — CYCLOBENZAPRINE HCL 10 MG PO TABS: 10.0000 mg | ORAL_TABLET | Freq: Three times a day (TID) | ORAL | 1 refills | Status: DC | PRN

## 2023-01-22 NOTE — Telephone Encounter (Signed)
2nd request for this mediation.

## 2023-02-02 ENCOUNTER — Inpatient Hospital Stay: Admission: RE | Admit: 2023-02-02 | Payer: Medicare PPO | Source: Ambulatory Visit

## 2023-02-11 ENCOUNTER — Ambulatory Visit: Payer: Medicare PPO | Admitting: Family Medicine

## 2023-02-15 ENCOUNTER — Ambulatory Visit: Payer: Medicare PPO | Admitting: Family Medicine

## 2023-02-15 VITALS — BP 122/74 | HR 88 | Ht <= 58 in | Wt 139.0 lb

## 2023-02-15 DIAGNOSIS — R413 Other amnesia: Secondary | ICD-10-CM

## 2023-02-15 NOTE — Progress Notes (Signed)
Subjective:     Patient ID: Felicia [name], female   DOB: 11-Aug-1966, 57 y.o.   MRN: [id number] Patient presents today with her husband.  They are both concerned over short-term memory loss.  Husband states that they will have the same conversation repeatedly without the patient being able to remember.  He will have to answer the same question multiple times as though the patient is unable to commit this to her short-term memory.  She is also leaving on appliances such as stove.  She left her dog outside last night.  She took the dog out to use the bathroom but then left it outside and forgot to bring it back in.  She is not getting lost driving however she is isolated at home caring for her elderly father and she very seldom interacts with other people.  Her husband thinks that she may be forgetting and taking her medication inappropriately by accident.  They deny any strokelike symptoms.  I performed a Mini-Mental status exam today.  The patient scored 27.  She got 1 incorrect on serial sevens.  She was unable to remember today's date.  She also could only remember 2 out of 3 objects on recall. Past Medical History:  Diagnosis Date  . Allergy    year around  . Anemia   . Anxiety   . Arthritis   . Asthma   . Atrial fibrillation ([place])   . Bipolar 1 disorder ([place])   . Borderline diabetes   . Depression   . Early cataract    right  . Ectopic pregnancy   . Fatty liver   . Headache   . Heart murmur   . Hyperlipidemia   . Insomnia   . Peripheral arterial disease ([place])   . Pneumonia   . PUD (peptic ulcer disease)   . Right knee pain 12-05-2015   per pt.  she fell out of her husband's truck and landed on her right knee  . Shortness of breath dyspnea   . Wears glasses   . Wears partial dentures    Past Surgical History:  Procedure Laterality Date  . ABDOMINAL AORTAGRAM N/A 09/12/2012   Procedure: ABDOMINAL AORTAGRAM;  Surgeon: Conrad Blandburg, MD;  Location: [facility] CATH LAB;  Service:  Cardiovascular;  Laterality: N/A;  . AORTA - BILATERAL FEMORAL ARTERY BYPASS GRAFT N/A 10/23/2014   Procedure: AORTOBIFEMORAL BYPASS GRAFT;  Surgeon: Angelia Mould, MD;  Location: [place];  Service: Vascular;  Laterality: N/A;  . BACK SURGERY    . CESAREAN SECTION    . COLONOSCOPY  01-14-2004   > 11 yr ago Dr Collene Mares - normal - report in epic   . DILATATION & CURETTAGE/HYSTEROSCOPY WITH MYOSURE N/A 10/15/2015   Procedure: DILATATION & CURETTAGE/HYSTEROSCOPY WITH MYOSURE;  Surgeon: Terrance Mass, MD;  Location: [facility];  Service: Gynecology;  Laterality: N/A;  . ESOPHAGOGASTRODUODENOSCOPY  01-14-2004   gastritis- dr Collene Mares- in epic already   . INCISIONAL HERNIA REPAIR N/A 08/01/2021   Procedure: INCISIONAL HERNIA REPAIR TIMES TWO, ONE WITH VENTRALEX ST HERNIA MESH, THE SECOND BY PRIMARY CLOSURE;  Surgeon: Virl Cagey, MD;  Location: [facility];  Service: General;  Laterality: N/A;  . MULTIPLE TOOTH EXTRACTIONS    . NOSE SURGERY    . NOSE SURGERY    . SHOULDER ARTHROSCOPY WITH SUBACROMIAL DECOMPRESSION, ROTATOR CUFF REPAIR AND BICEP TENDON REPAIR Left 09/26/2019   Procedure: left shoulder arthroscopy, biceps release and tenodesis, mini open rotator cuff tear repair subscapularis and  supraspinatus;  Surgeon: Meredith Pel, MD;  Location: [place];  Service: Orthopedics;  Laterality: Left;  . SHOULDER SURGERY     rotator cuff repair   . TONSILLECTOMY    . TUBAL LIGATION    . UPPER GASTROINTESTINAL ENDOSCOPY  01-14-2004   in epic- dr Collene Mares- gastritis    Current Outpatient Medications on File Prior to Visit  Medication Sig Dispense Refill  . albuterol (VENTOLIN HFA) 108 (90 Base) MCG/ACT inhaler INHALE 2 PUFFS INTO THE LUNGS EVERY 6 HOURS AS NEEDED FOR WHEEZE/SHORTNES OF BREATH 18 each 3  . aspirin EC 325 MG tablet Take 325 mg by mouth daily.    Marland Kitchen atorvastatin (LIPITOR) 80 MG tablet Take 1 tablet (80 mg total) by mouth daily. 90 tablet 3  . clonazePAM (KLONOPIN) 1 MG tablet Take 1 tablet (1  mg total) by mouth 3 (three) times daily. 90 tablet 3  . cyclobenzaprine (FLEXERIL) 10 MG tablet Take 1 tablet (10 mg total) by mouth 3 (three) times daily as needed for muscle spasms. 90 tablet 1  . diphenhydrAMINE (BENADRYL) 25 MG tablet Take 50 mg by mouth every 6 (six) hours as needed for allergies.    Marland Kitchen EPIPEN 2-PAK 0.3 MG/0.3ML SOAJ injection USE AS DIRECTED (Patient taking differently: Inject 0.3 mg into the muscle as needed for anaphylaxis.) 2 Device 0  . ezetimibe (ZETIA) 10 MG tablet Take 1 tablet (10 mg total) by mouth daily. 90 tablet 3  . hydrochlorothiazide (HYDRODIURIL) 25 MG tablet Take 1 tablet (25 mg total) by mouth daily. 90 tablet 3  . KLOR-CON M20 20 MEQ tablet TAKE 2 TABLETS BY MOUTH DAILY 180 tablet 1  . meloxicam (MOBIC) 15 MG tablet Take 1 tablet (15 mg total) by mouth daily. 90 tablet 3  . metoprolol succinate (TOPROL-XL) 50 MG 24 hr tablet Take 1 tablet (50 mg total) by mouth daily. Take with or immediately following a meal. 90 tablet 3  . traMADol (ULTRAM) 50 MG tablet Take 2 tablets (100 mg total) by mouth in the morning, at noon, and at bedtime. 180/month 180 tablet 0   No current facility-administered medications on file prior to visit.   Allergies  Allergen Reactions  . Bee Pollen Swelling and Other (See Comments)    Also ticks   . Bactrim [Sulfamethoxazole-Trimethoprim] Hives  . Cephalosporins Hives  . Doxycycline Hives  . Tetracyclines & Related Hives  . Other Itching    Nuts causes itching and hives not sure what type  . Augmentin [Amoxicillin-Pot Clavulanate] Diarrhea  . Codeine Nausea Only  . Reflex Pain Relief [Menthol] Rash    Muscle Rub   Social History   Socioeconomic History  . Marital status: Married    Spouse name: Elta Guadeloupe  . Number of children: 2  . Years of education: Not on file  . Highest education level: Not on file  Occupational History  . Not on file  Tobacco Use  . Smoking status: Every Day    Packs/day: 0.50    Years: 30.00     Additional pack years: 0.00    Total pack years: 15.00    Types: Cigarettes    Start date: 01/29/1981  . Smokeless tobacco: Never  Vaping Use  . Vaping Use: Never used  Substance and Sexual Activity  . Alcohol use: Yes    Alcohol/week: 1.0 standard drink of alcohol    Types: 1 Shots of liquor per week    Comment: very occassionally on social occassions  . Drug use:  No  . Sexual activity: Yes  Other Topics Concern  . Not on file  Social History Narrative   Married 24 years in 2022.   2 daughters   4 grandsons, 1 granddaughter   Social Determinants of Health   Financial Resource Strain: Low Risk  (01/12/2023)   Overall Financial Resource Strain (CARDIA)   . Difficulty of Paying Living Expenses: Not hard at all  Food Insecurity: No Food Insecurity (01/12/2023)   Hunger Vital Sign   . Worried About Charity fundraiser in the Last Year: Never true   . Ran Out of Food in the Last Year: Never true  Transportation Needs: No Transportation Needs (01/12/2023)   PRAPARE - Transportation   . Lack of Transportation (Medical): No   . Lack of Transportation (Non-Medical): No  Physical Activity: Insufficiently Active (01/12/2023)   Exercise Vital Sign   . Days of Exercise per Week: 3 days   . Minutes of Exercise per Session: 30 min  Stress: No Stress Concern Present (01/12/2023)   [place]   . Feeling of Stress : Not at all  Social Connections: Moderately Integrated (01/12/2023)   Social Connection and Isolation Panel [NHANES]   . Frequency of Communication with Friends and Family: More than three times a week   . Frequency of Social Gatherings with Friends and Family: Three times a week   . Attends Religious Services: 1 to 4 times per year   . Active Member of Clubs or Organizations: No   . Attends Archivist Meetings: Never   . Marital Status: Married  Human resources officer Violence: Not At Risk (01/12/2023)    Humiliation, Afraid, Rape, and Kick questionnaire   . Fear of Current or Ex-Partner: No   . Emotionally Abused: No   . Physically Abused: No   . Sexually Abused: No    Review of Systems  All other systems reviewed and are negative.      Objective:   Physical Exam Vitals reviewed. Exam conducted with a chaperone present.  Constitutional:      General: She is not in acute distress.    Appearance: Normal appearance. She is well-developed and normal weight. She is not ill-appearing, toxic-appearing or diaphoretic.  HENT:     Nose: Nose normal.     Mouth/Throat:     Mouth: Mucous membranes are dry.  Eyes:     Conjunctiva/sclera: Conjunctivae normal.  Cardiovascular:     Heart sounds: Normal heart sounds. No murmur heard.    No friction rub. No gallop.  Pulmonary:     Effort: Pulmonary effort is normal. No respiratory distress.     Breath sounds: Decreased air movement present. No stridor. Decreased breath sounds and wheezing present. No rhonchi or rales.  Abdominal:     General: A surgical scar is present. Bowel sounds are normal. There is no distension. There are no signs of injury.     Palpations: Abdomen is soft. There is no shifting dullness, hepatomegaly, splenomegaly or mass.     Tenderness: There is no abdominal tenderness. There is no rebound.     Hernia: No hernia is present.  Genitourinary:    Exam position: Lithotomy position.     Cervix: No cervical motion tenderness.     Uterus: Normal.      Adnexa: Right adnexa normal and left adnexa normal.       Right: No mass.         Left: No  mass.    Skin:    Findings: No rash.  Neurological:     Mental Status: She is alert. Mental status is at baseline.     Sensory: Sensation is intact. No sensory deficit.     Coordination: Coordination is intact. Coordination normal.     Gait: Gait is intact. Gait normal.     Deep Tendon Reflexes: Reflexes normal.       Assessment:     Memory loss - Plan: Vitamin B12,  TSH  Plan:   Patient certainly has short-term memory loss and remains the same.  First I recommended we reduce her medications which could be contributing.  Discontinue Flexeril.  Reduce tramadol and Klonopin by 50% and recheck the patient in 2 weeks to see if there is any improvement.  Strongly recommended we try to wean her off all centrally acting medication.  Check a B12 level as well as a TSH and also obtain an MRI to evaluate for any vascular causes of dementia.

## 2023-02-16 LAB — TSH: TSH: 1.39 mIU/L (ref 0.40–4.50)

## 2023-02-16 LAB — VITAMIN B12: Vitamin B-12: 281 pg/mL (ref 200–1100)

## 2023-02-16 LAB — EXTRA LAV TOP TUBE

## 2023-02-20 ENCOUNTER — Other Ambulatory Visit: Payer: Self-pay | Admitting: Family Medicine

## 2023-02-20 DIAGNOSIS — M5431 Sciatica, right side: Secondary | ICD-10-CM

## 2023-02-22 NOTE — Telephone Encounter (Signed)
Requested medications are due for refill today.  Unsure  Requested medications are on the active medications list.  yes  Last refill. 01/22/2023 #90 1 rf  Future visit scheduled.   no  Notes to clinic.  Refill not delegated.    Requested Prescriptions  Pending Prescriptions Disp Refills   cyclobenzaprine (FLEXERIL) 10 MG tablet [Pharmacy Med Name: CYCLOBENZAPRINE 10 MG TABLET] 90 tablet 1    Sig: TAKE 1 TABLET BY MOUTH THREE TIMES A DAY AS NEEDED FOR MUSCLE SPASMS     Not Delegated - Analgesics:  Muscle Relaxants Failed - 02/20/2023  5:08 PM      Failed - This refill cannot be delegated      Failed - Valid encounter within last 6 months    Recent Outpatient Visits           1 year ago Acute cystitis with hematuria   [place] Eulogio Bear, NP   1 year ago General medical exam   [place] [facility] Susy Frizzle, MD   1 year ago Acute midline low back pain with right-sided sciatica   [place] Susy Frizzle, MD   2 years ago Ventral hernia without obstruction or gangrene   [place] Susy Frizzle, MD   2 years ago Rib pain on left side   [place] Pickard, Cammie Mcgee, MD

## 2023-02-24 ENCOUNTER — Inpatient Hospital Stay: Admission: RE | Admit: 2023-02-24 | Payer: Medicare PPO | Source: Ambulatory Visit

## 2023-03-01 ENCOUNTER — Ambulatory Visit: Payer: Medicare PPO | Admitting: Family Medicine

## 2023-03-01 ENCOUNTER — Other Ambulatory Visit: Payer: Self-pay | Admitting: Family Medicine

## 2023-03-01 VITALS — BP 132/84 | HR 105 | Ht <= 58 in | Wt 135.2 lb

## 2023-03-01 DIAGNOSIS — R413 Other amnesia: Secondary | ICD-10-CM | POA: Diagnosis not present

## 2023-03-01 MED ORDER — VITAMIN B-12 1000 MCG PO TABS: 1000.0000 ug | ORAL_TABLET | Freq: Every day | ORAL | 11 refills | Status: AC

## 2023-03-01 MED ORDER — CYCLOBENZAPRINE HCL 5 MG PO TABS: 5.0000 mg | ORAL_TABLET | Freq: Two times a day (BID) | ORAL | 0 refills | Status: DC

## 2023-03-01 MED ORDER — DONEPEZIL HCL 5 MG PO TABS: 5.0000 mg | ORAL_TABLET | Freq: Every day | ORAL | 1 refills | Status: DC

## 2023-03-01 NOTE — Progress Notes (Signed)
Subjective:     Patient ID: Felicia [name], female   DOB: [birth year], 57 y.o.   MRN: [id number] 02/15/23 Patient presents today with her husband.  They are both concerned over short-term memory loss.  Husband states that they will have the same conversation repeatedly without the patient being able to remember.  He will have to answer the same question multiple times as though the patient is unable to commit this to her short-term memory.  She is also leaving on appliances such as stove.  She left her dog outside last night.  She took the dog out to use the bathroom but then left it outside and forgot to bring it back in.  She is not getting lost driving however she is isolated at home caring for her elderly father and she very seldom interacts with other people.  Her husband thinks that she may be forgetting and taking her medication inappropriately by accident.  They deny any strokelike symptoms.  I performed a Mini-Mental status exam today.  The patient scored 27.  She got 1 incorrect on serial sevens.  She was unable to remember today's date.  She also could only remember 2 out of 3 objects on recall.  At that time, my plan was:  Patient certainly has short-term memory loss..  First I recommended we reduce her medications which could be contributing.  Discontinue Flexeril.  Reduce tramadol and Klonopin by 50% and recheck the patient in 2 weeks to see if there is any improvement.  Strongly recommended we try to wean her off all centrally acting medication.  Check a B12 level as well as a TSH and also obtain an MRI to evaluate for any vascular causes of dementia.  03/01/23 Patient's husband states that she still taking medication.  She did not stop the Flexeril.  She did reduce Klonopin slightly.  She is still taking tramadol.  I am concerned that she did not change the medication after we discussed that.  However he states that the memory loss seems to be getting worse.  MRI has not been scheduled.  TSH was  normal.  B12 was borderline low.  We called the patient and told her to start taking B12 but she never started the B12   Past Medical History:  Diagnosis Date   Allergy    year around   Anemia    Anxiety    Arthritis    Asthma    Atrial fibrillation (HCC)    Bipolar 1 disorder (HCC)    Borderline diabetes    Depression    Early cataract    right   Ectopic pregnancy    Fatty liver    Headache    Heart murmur    Hyperlipidemia    Insomnia    Peripheral arterial disease (HCC)    Pneumonia    PUD (peptic ulcer disease)    Right knee pain 12-05-2015   per pt.  she fell out of her husband's truck and landed on her right knee   Shortness of breath dyspnea    Wears glasses    Wears partial dentures    Past Surgical History:  Procedure Laterality Date   ABDOMINAL AORTAGRAM N/A 09/12/2012   Procedure: ABDOMINAL Ronny Flurry;  Surgeon: Fransisco Hertz, MD;  Location: [facility] CATH LAB;  Service: Cardiovascular;  Laterality: N/A;   AORTA - BILATERAL FEMORAL ARTERY BYPASS GRAFT N/A 10/23/2014   Procedure: AORTOBIFEMORAL BYPASS GRAFT;  Surgeon: Chuck Hint, MD;  Location: [facility] OR;  Service: Vascular;  Laterality: N/A;   BACK SURGERY     CESAREAN SECTION     COLONOSCOPY  01-14-2004   > 11 yr ago Dr Loreta Ave - normal - report in epic    DILATATION & CURETTAGE/HYSTEROSCOPY WITH MYOSURE N/A 10/15/2015   Procedure: DILATATION & CURETTAGE/HYSTEROSCOPY WITH MYOSURE;  Surgeon: Ok Edwards, MD;  Location: [facility] ORS;  Service: Gynecology;  Laterality: N/A;   ESOPHAGOGASTRODUODENOSCOPY  01-14-2004   gastritis- dr Loreta Ave- in epic already    [facility] HERNIA REPAIR N/A 08/01/2021   Procedure: INCISIONAL HERNIA REPAIR TIMES TWO, ONE WITH VENTRALEX ST HERNIA MESH, THE SECOND BY PRIMARY CLOSURE;  Surgeon: Lucretia Roers, MD;  Location: [facility] ORS;  Service: General;  Laterality: N/A;   MULTIPLE TOOTH EXTRACTIONS     NOSE SURGERY     NOSE SURGERY     SHOULDER ARTHROSCOPY WITH SUBACROMIAL DECOMPRESSION,  ROTATOR CUFF REPAIR AND BICEP TENDON REPAIR Left 09/26/2019   Procedure: left shoulder arthroscopy, biceps release and tenodesis, mini open rotator cuff tear repair subscapularis and supraspinatus;  Surgeon: Cammy Copa, MD;  Location: [facility] OR;  Service: Orthopedics;  Laterality: Left;   SHOULDER SURGERY     rotator cuff repair    TONSILLECTOMY     TUBAL LIGATION     UPPER GASTROINTESTINAL ENDOSCOPY  01-14-2004   in epic- dr Loreta Ave- gastritis    Current Outpatient Medications on File Prior to Visit  Medication Sig Dispense Refill   albuterol (VENTOLIN HFA) 108 (90 Base) MCG/ACT inhaler INHALE 2 PUFFS INTO THE LUNGS EVERY 6 HOURS AS NEEDED FOR WHEEZE/SHORTNES OF BREATH 18 each 3   aspirin EC 325 MG tablet Take 325 mg by mouth daily.     atorvastatin (LIPITOR) 80 MG tablet Take 1 tablet (80 mg total) by mouth daily. 90 tablet 3   clonazePAM (KLONOPIN) 1 MG tablet Take 1 tablet (1 mg total) by mouth 3 (three) times daily. 90 tablet 3   cyclobenzaprine (FLEXERIL) 10 MG tablet TAKE 1 TABLET BY MOUTH THREE TIMES A DAY AS NEEDED FOR MUSCLE SPASMS 90 tablet 1   diphenhydrAMINE (BENADRYL) 25 MG tablet Take 50 mg by mouth every 6 (six) hours as needed for allergies.     EPIPEN 2-PAK 0.3 MG/0.3ML SOAJ injection USE AS DIRECTED (Patient taking differently: Inject 0.3 mg into the muscle as needed for anaphylaxis.) 2 Device 0   ezetimibe (ZETIA) 10 MG tablet Take 1 tablet (10 mg total) by mouth daily. 90 tablet 3   hydrochlorothiazide (HYDRODIURIL) 25 MG tablet Take 1 tablet (25 mg total) by mouth daily. 90 tablet 3   KLOR-CON M20 20 MEQ tablet TAKE 2 TABLETS BY MOUTH DAILY 180 tablet 1   meloxicam (MOBIC) 15 MG tablet Take 1 tablet (15 mg total) by mouth daily. 90 tablet 3   metoprolol succinate (TOPROL-XL) 50 MG 24 hr tablet Take 1 tablet (50 mg total) by mouth daily. Take with or immediately following a meal. 90 tablet 3   traMADol (ULTRAM) 50 MG tablet Take 2 tablets (100 mg total) by mouth in the  morning, at noon, and at bedtime. 180/month 180 tablet 0   No current facility-administered medications on file prior to visit.   Allergies  Allergen Reactions   Bee Pollen Swelling and Other (See Comments)    Also ticks    Bactrim [Sulfamethoxazole-Trimethoprim] Hives   Cephalosporins Hives   Doxycycline Hives   Tetracyclines & Related Hives   Other Itching    Nuts causes itching and hives  not sure what type   Augmentin [Amoxicillin-Pot Clavulanate] Diarrhea   Codeine Nausea Only   Reflex Pain Relief [Menthol] Rash    Muscle Rub   Social History   Socioeconomic History   Marital status: Married    Spouse name: Loraine LericheMark   Number of children: 2   Years of education: Not on file   Highest education level: Not on file  Occupational History   Not on file  Tobacco Use   Smoking status: Every Day    Packs/day: 0.50    Years: 30.00    Additional pack years: 0.00    Total pack years: 15.00    Types: Cigarettes    Start date: 01/29/1981   Smokeless tobacco: Never  Vaping Use   Vaping Use: Never used  Substance and Sexual Activity   Alcohol use: Yes    Alcohol/week: 1.0 standard drink of alcohol    Types: 1 Shots of liquor per week    Comment: very occassionally on social occassions   Drug use: No   Sexual activity: Yes  Other Topics Concern   Not on file  Social History Narrative   Married 38 years in 2022.   2 daughters   4 grandsons, 1 granddaughter   Social Determinants of Health   Financial Resource Strain: Low Risk  (01/12/2023)   Overall Financial Resource Strain (CARDIA)    Difficulty of Paying Living Expenses: Not hard at all  Food Insecurity: No Food Insecurity (01/12/2023)   Hunger Vital Sign    Worried About Running Out of Food in the Last Year: Never true    Ran Out of Food in the Last Year: Never true  Transportation Needs: No Transportation Needs (01/12/2023)   PRAPARE - Administrator, Civil ServiceTransportation    Lack of Transportation (Medical): No    Lack of Transportation  (Non-Medical): No  Physical Activity: Insufficiently Active (01/12/2023)   Exercise Vital Sign    Days of Exercise per Week: 3 days    Minutes of Exercise per Session: 30 min  Stress: No Stress Concern Present (01/12/2023)   [employer] - Occupational Stress Questionnaire    Feeling of Stress : Not at all  Social Connections: Moderately Integrated (01/12/2023)   Social Connection and Isolation Panel [NHANES]    Frequency of Communication with Friends and Family: More than three times a week    Frequency of Social Gatherings with Friends and Family: Three times a week    Attends Religious Services: 1 to 4 times per year    Active Member of Clubs or Organizations: No    Attends BankerClub or Organization Meetings: Never    Marital Status: Married  Catering managerntimate Partner Violence: Not At Risk (01/12/2023)   Humiliation, Afraid, Rape, and Kick questionnaire    Fear of Current or Ex-Partner: No    Emotionally Abused: No    Physically Abused: No    Sexually Abused: No    Review of Systems  All other systems reviewed and are negative.      Objective:   Physical Exam Vitals reviewed. Exam conducted with a chaperone present.  Constitutional:      General: She is not in acute distress.    Appearance: Normal appearance. She is well-developed and normal weight. She is not ill-appearing, toxic-appearing or diaphoretic.  HENT:     Nose: Nose normal.     Mouth/Throat:     Mouth: Mucous membranes are dry.  Eyes:     Conjunctiva/sclera: Conjunctivae normal.  Cardiovascular:  Heart sounds: Normal heart sounds. No murmur heard.    No friction rub. No gallop.  Pulmonary:     Effort: Pulmonary effort is normal. No respiratory distress.     Breath sounds: Decreased air movement present. No stridor. Decreased breath sounds and wheezing present. No rhonchi or rales.  Abdominal:     General: A surgical scar is present. Bowel sounds are normal. There is no distension. There are  no signs of injury.     Palpations: Abdomen is soft. There is no shifting dullness, hepatomegaly, splenomegaly or mass.     Tenderness: There is no abdominal tenderness. There is no rebound.     Hernia: No hernia is present.  Genitourinary:    Exam position: Lithotomy position.     Cervix: No cervical motion tenderness.     Uterus: Normal.      Adnexa: Right adnexa normal and left adnexa normal.       Right: No mass.         Left: No mass.    Skin:    Findings: No rash.  Neurological:     Mental Status: She is alert. Mental status is at baseline.     Sensory: Sensation is intact. No sensory deficit.     Coordination: Coordination is intact. Coordination normal.     Gait: Gait is intact. Gait normal.     Deep Tendon Reflexes: Reflexes normal.        Assessment:   Memory loss - Plan: MR Brain W Wo Contrast  Plan:  Memory loss - Plan: MR Brain W Wo Contrast Proceed with MRI to determine if there is vascular dementia.  Begin Aricept 5 mg daily.  Discontinue tramadol.  Reduce Flexeril to 5 mg twice daily.  Reduce Klonopin to 0.5 mg twice daily.  Strongly encouraged the patient to try to cut back on these medications as they can affect her memory.  Recheck in 6 months

## 2023-03-02 ENCOUNTER — Other Ambulatory Visit: Payer: Self-pay | Admitting: Family Medicine

## 2023-03-02 MED ORDER — CLONAZEPAM 0.5 MG PO TABS: 1.0000 mg | ORAL_TABLET | Freq: Two times a day (BID) | ORAL | 0 refills | Status: DC

## 2023-03-08 ENCOUNTER — Other Ambulatory Visit: Payer: Self-pay | Admitting: Family Medicine

## 2023-03-09 ENCOUNTER — Other Ambulatory Visit: Payer: Self-pay | Admitting: Family Medicine

## 2023-03-10 ENCOUNTER — Telehealth: Payer: Self-pay

## 2023-03-10 NOTE — Telephone Encounter (Signed)
Pt called in stating that she has been experiencing severe leg cramps at night since starting this med donepezil (ARICEPT) 5 MG tablet. Pt states that she is afraid to continue to take this med. Please advise   Cb#: [phone]

## 2023-03-17 ENCOUNTER — Other Ambulatory Visit: Payer: Self-pay | Admitting: Family Medicine

## 2023-03-18 NOTE — Telephone Encounter (Signed)
Requested medication (s) are due for refill today:   Provider to review  Requested medication (s) are on the active medication list:   Yes  Future visit scheduled:   No  LOV 03/01/2023   Last ordered: 03/01/2023 #60, 0 refills  Returned because this is a non delegated refill.    Dose was decreased to 5 mg during last visit.    This is a duplicate request.  Requested Prescriptions  Pending Prescriptions Disp Refills   cyclobenzaprine (FLEXERIL) 5 MG tablet [Pharmacy Med Name: CYCLOBENZAPRINE 5 MG TABLET] 60 tablet 0    Sig: TAKE 1 TABLET BY MOUTH TWICE A DAY     Not Delegated - Analgesics:  Muscle Relaxants Failed - 03/17/2023  5:03 PM      Failed - This refill cannot be delegated      Failed - Valid encounter within last 6 months    Recent Outpatient Visits           1 year ago Acute cystitis with hematuria   [facility] Valentino Nose, NP   1 year ago General medical exam   [facility] Donita Brooks, MD   1 year ago Acute midline low back pain with right-sided sciatica   [facility] Donita Brooks, MD   2 years ago Ventral hernia without obstruction or gangrene   [facility] Donita Brooks, MD   2 years ago Rib pain on left side   [facility] Pickard, Priscille Heidelberg, MD

## 2023-03-20 ENCOUNTER — Ambulatory Visit
Admission: RE | Admit: 2023-03-20 | Discharge: 2023-03-20 | Disposition: A | Discharge status: Home / Self Care | Payer: Medicare PPO | Source: Ambulatory Visit | Attending: Family Medicine | Admitting: Family Medicine

## 2023-03-20 DIAGNOSIS — R413 Other amnesia: Secondary | ICD-10-CM

## 2023-03-20 MED ORDER — GADOPICLENOL 0.5 MMOL/ML IV SOLN
6.0000 mL | Freq: Once | INTRAVENOUS | Status: AC | PRN
  Administered 2023-03-20: 6 mL via INTRAVENOUS

## 2023-03-26 ENCOUNTER — Other Ambulatory Visit: Payer: Self-pay | Admitting: Family Medicine

## 2023-03-26 NOTE — Telephone Encounter (Signed)
Requested medication (s) are due for refill today: yes  Requested medication (s) are on the active medication list: yes  Last refill:  03/01/23 #60/0  Future visit scheduled: yes  Notes to clinic:  Unable to refill per protocol, cannot delegate.     Requested Prescriptions  Pending Prescriptions Disp Refills   cyclobenzaprine (FLEXERIL) 5 MG tablet [Pharmacy Med Name: CYCLOBENZAPRINE 5 MG TABLET] 60 tablet 0    Sig: TAKE 1 TABLET BY MOUTH TWICE A DAY     Not Delegated - Analgesics:  Muscle Relaxants Failed - 03/26/2023  4:35 PM      Failed - This refill cannot be delegated      Failed - Valid encounter within last 6 months    Recent Outpatient Visits           1 year ago Acute cystitis with hematuria   [facility] Valentino Nose, NP   1 year ago General medical exam   [facility] Donita Brooks, MD   1 year ago Acute midline low back pain with right-sided sciatica   [facility] Donita Brooks, MD   2 years ago Ventral hernia without obstruction or gangrene   [facility] Donita Brooks, MD   2 years ago Rib pain on left side   [facility] Pickard, Priscille Heidelberg, MD

## 2023-03-31 ENCOUNTER — Other Ambulatory Visit: Payer: Self-pay | Admitting: Family Medicine

## 2023-03-31 NOTE — Telephone Encounter (Signed)
Requested medication (s) are due for refill today: yes  Requested medication (s) are on the active medication list: yes  Last refill:  03/01/23  Future visit scheduled: no  Notes to clinic:  Unable to refill per protocol, cannot delegate. Medication was recently refused, but medication is due for a refill. Last refill 03/01/23 for 30 days. Routing for approval.      Requested Prescriptions  Pending Prescriptions Disp Refills   cyclobenzaprine (FLEXERIL) 5 MG tablet [Pharmacy Med Name: CYCLOBENZAPRINE 5 MG TABLET] 60 tablet 0    Sig: TAKE 1 TABLET BY MOUTH TWICE A DAY     Not Delegated - Analgesics:  Muscle Relaxants Failed - 03/31/2023 12:59 PM      Failed - This refill cannot be delegated      Failed - Valid encounter within last 6 months    Recent Outpatient Visits           1 year ago Acute cystitis with hematuria   [facility] Valentino Nose, NP   1 year ago General medical exam   [facility] Donita Brooks, MD   1 year ago Acute midline low back pain with right-sided sciatica   [facility] Donita Brooks, MD   2 years ago Ventral hernia without obstruction or gangrene   [facility] Donita Brooks, MD   2 years ago Rib pain on left side   [facility] Pickard, Priscille Heidelberg, MD

## 2023-04-13 ENCOUNTER — Ambulatory Visit: Payer: Medicare PPO | Admitting: Family Medicine

## 2023-04-13 ENCOUNTER — Encounter: Payer: Self-pay | Admitting: Family Medicine

## 2023-04-13 VITALS — BP 164/70 | HR 109 | Temp 97.8°F | Ht <= 58 in | Wt 139.0 lb

## 2023-04-13 DIAGNOSIS — N3001 Acute cystitis with hematuria: Secondary | ICD-10-CM

## 2023-04-13 DIAGNOSIS — R300 Dysuria: Secondary | ICD-10-CM | POA: Diagnosis not present

## 2023-04-13 DIAGNOSIS — I10 Essential (primary) hypertension: Secondary | ICD-10-CM | POA: Diagnosis not present

## 2023-04-13 HISTORY — DX: Essential (primary) hypertension: I10

## 2023-04-13 LAB — URINALYSIS, ROUTINE W REFLEX MICROSCOPIC
Bilirubin Urine: NEGATIVE
Glucose, UA: NEGATIVE
Hyaline Cast: NONE SEEN /LPF
Ketones, ur: NEGATIVE
Nitrite: POSITIVE — AB
Specific Gravity, Urine: 1.02 (ref 1.001–1.035)
pH: 5.5 (ref 5.0–8.0)

## 2023-04-13 LAB — MICROSCOPIC MESSAGE

## 2023-04-13 MED ORDER — CIPROFLOXACIN HCL 500 MG PO TABS
500.0000 mg | ORAL_TABLET | Freq: Two times a day (BID) | ORAL | 0 refills | Status: DC
Start: 2023-04-13 — End: 2023-04-15

## 2023-04-13 NOTE — Assessment & Plan Note (Signed)
Urine dipstick shows positive for RBC's, positive for protein, positive for nitrates, and positive for leukocytes.  Micro exam: 0-5 WBC's per HPF, 0-5 RBC's per HPF, and many+ bacteria. Will treat with Cipro 500mg  BID for 7 days. Push fluids. May continue Advil for pain. Return to office if symptoms persist or worsen.

## 2023-04-13 NOTE — Assessment & Plan Note (Signed)
BP elevated in office today, likely due to acute illness, instructed to monitor at home and if remains elevated to schedule appointment with PCP. 

## 2023-04-13 NOTE — Progress Notes (Signed)
Acute Office Visit  Subjective:     Patient ID: Felicia [name], female    DOB: August 12, 1966, 57 y.o.   MRN: [id number]  Chief Complaint  Patient presents with   Acute Visit    Possible bladder infection    HPI Patient is in today for urinary frequency, nocturia, lower abdominal pain, headaches, chills, dysuria Denies fever, hematuria, lower bilateral flank pain. Has tried Advil   Review of Systems  All other systems reviewed and are negative.  Past Medical History:  Diagnosis Date   Allergy    year around   Anemia    Anxiety    Arthritis    Asthma    Atrial fibrillation (HCC)    Bipolar 1 disorder (HCC)    Borderline diabetes    Depression    Early cataract    right   Ectopic pregnancy    Essential hypertension 04/13/2023   Fatty liver    Headache    Heart murmur    Hyperlipidemia    Insomnia    Peripheral arterial disease (HCC)    Pneumonia    PUD (peptic ulcer disease)    Right knee pain 12-05-2015   per pt.  she fell out of her husband's truck and landed on her right knee   Shortness of breath dyspnea    Wears glasses    Wears partial dentures    Past Surgical History:  Procedure Laterality Date   ABDOMINAL AORTAGRAM N/A 09/12/2012   Procedure: ABDOMINAL Ronny Flurry;  Surgeon: Fransisco Hertz, MD;  Location: [facility] CATH LAB;  Service: Cardiovascular;  Laterality: N/A;   AORTA - BILATERAL FEMORAL ARTERY BYPASS GRAFT N/A 10/23/2014   Procedure: AORTOBIFEMORAL BYPASS GRAFT;  Surgeon: Chuck Hint, MD;  Location: [facility] OR;  Service: Vascular;  Laterality: N/A;   BACK SURGERY     CESAREAN SECTION     COLONOSCOPY  01-14-2004   > 11 yr ago Dr Loreta Ave - normal - report in epic    DILATATION & CURETTAGE/HYSTEROSCOPY WITH MYOSURE N/A 10/15/2015   Procedure: DILATATION & CURETTAGE/HYSTEROSCOPY WITH MYOSURE;  Surgeon: Ok Edwards, MD;  Location: [facility] ORS;  Service: Gynecology;  Laterality: N/A;   ESOPHAGOGASTRODUODENOSCOPY  01-14-2004   gastritis- dr Loreta Ave- in epic  already    [facility] HERNIA REPAIR N/A 08/01/2021   Procedure: INCISIONAL HERNIA REPAIR TIMES TWO, ONE WITH VENTRALEX ST HERNIA MESH, THE SECOND BY PRIMARY CLOSURE;  Surgeon: Lucretia Roers, MD;  Location: [facility] ORS;  Service: General;  Laterality: N/A;   MULTIPLE TOOTH EXTRACTIONS     NOSE SURGERY     NOSE SURGERY     SHOULDER ARTHROSCOPY WITH SUBACROMIAL DECOMPRESSION, ROTATOR CUFF REPAIR AND BICEP TENDON REPAIR Left 09/26/2019   Procedure: left shoulder arthroscopy, biceps release and tenodesis, mini open rotator cuff tear repair subscapularis and supraspinatus;  Surgeon: Cammy Copa, MD;  Location: [facility] OR;  Service: Orthopedics;  Laterality: Left;   SHOULDER SURGERY     rotator cuff repair    TONSILLECTOMY     TUBAL LIGATION     UPPER GASTROINTESTINAL ENDOSCOPY  01-14-2004   in epic- dr Loreta Ave- gastritis    Current Outpatient Medications on File Prior to Visit  Medication Sig Dispense Refill   albuterol (VENTOLIN HFA) 108 (90 Base) MCG/ACT inhaler INHALE 2 PUFFS INTO THE LUNGS EVERY 6 HOURS AS NEEDED FOR WHEEZE/SHORTNES OF BREATH 18 each 3   aspirin EC 325 MG tablet Take 325 mg by mouth daily.     atorvastatin (LIPITOR)  80 MG tablet Take 1 tablet (80 mg total) by mouth daily. 90 tablet 3   clonazePAM (KLONOPIN) 0.5 MG tablet Take 2 tablets (1 mg total) by mouth 2 (two) times daily. 60 tablet 0   cyanocobalamin (VITAMIN B12) 1000 MCG tablet Take 1 tablet (1,000 mcg total) by mouth daily. 30 tablet 11   cyclobenzaprine (FLEXERIL) 5 MG tablet TAKE 1 TABLET BY MOUTH TWICE A DAY 60 tablet 0   diphenhydrAMINE (BENADRYL) 25 MG tablet Take 50 mg by mouth every 6 (six) hours as needed for allergies.     donepezil (ARICEPT) 5 MG tablet Take 1 tablet (5 mg total) by mouth at bedtime. 90 tablet 1   EPIPEN 2-PAK 0.3 MG/0.3ML SOAJ injection USE AS DIRECTED (Patient taking differently: Inject 0.3 mg into the muscle as needed for anaphylaxis.) 2 Device 0   ezetimibe (ZETIA) 10 MG tablet Take 1  tablet (10 mg total) by mouth daily. 90 tablet 3   hydrochlorothiazide (HYDRODIURIL) 25 MG tablet Take 1 tablet (25 mg total) by mouth daily. 90 tablet 3   KLOR-CON M20 20 MEQ tablet TAKE 2 TABLETS BY MOUTH DAILY 180 tablet 1   meloxicam (MOBIC) 15 MG tablet Take 1 tablet (15 mg total) by mouth daily. 90 tablet 3   metoprolol succinate (TOPROL-XL) 50 MG 24 hr tablet Take 1 tablet (50 mg total) by mouth daily. Take with or immediately following a meal. 90 tablet 3   No current facility-administered medications on file prior to visit.   Allergies  Allergen Reactions   Bee Pollen Swelling and Other (See Comments)    Also ticks    Bactrim [Sulfamethoxazole-Trimethoprim] Hives   Cephalosporins Hives   Doxycycline Hives   Tetracyclines & Related Hives   Other Itching    Nuts causes itching and hives not sure what type   Augmentin [Amoxicillin-Pot Clavulanate] Diarrhea   Codeine Nausea Only   Reflex Pain Relief [Menthol] Rash    Muscle Rub        Objective:    BP (!) 164/70   Pulse (!) 109   Temp 97.8 F (36.6 C) (Oral)   Ht 4\' 9"  (1.448 m)   Wt 139 lb (63 kg)   LMP 12/26/2012   SpO2 98%   BMI 30.08 kg/m  BP Readings from Last 3 Encounters:  04/13/23 (!) 164/70  03/01/23 132/84  02/15/23 122/74      Physical Exam Vitals and nursing note reviewed.  Constitutional:      Appearance: Normal appearance. She is normal weight.  HENT:     Head: Normocephalic and atraumatic.  Abdominal:     Palpations: Abdomen is soft.     Tenderness: There is no abdominal tenderness. There is no right CVA tenderness or left CVA tenderness.  Skin:    General: Skin is warm and dry.  Neurological:     General: No focal deficit present.     Mental Status: She is alert and oriented to person, place, and time. Mental status is at baseline.  Psychiatric:        Mood and Affect: Mood normal.        Behavior: Behavior normal.        Thought Content: Thought content normal.        Judgment:  Judgment normal.     No results found for any visits on 04/13/23.      Assessment & Plan:   Problem List Items Addressed This Visit     Acute cystitis with hematuria -  Primary    Urine dipstick shows positive for RBC's, positive for protein, positive for nitrates, and positive for leukocytes.  Micro exam: 0-5 WBC's per HPF, 0-5 RBC's per HPF, and many+ bacteria. Will treat with Cipro 500mg  BID for 7 days. Push fluids. May continue Advil for pain. Return to office if symptoms persist or worsen.       Essential hypertension    BP elevated in office today, likely due to acute illness, instructed to monitor at home and if remains elevated to schedule appointment with PCP.      Other Visit Diagnoses     Dysuria       Relevant Orders   Urinalysis, Routine w reflex microscopic       Meds ordered this encounter  Medications   ciprofloxacin (CIPRO) 500 MG tablet    Sig: Take 1 tablet (500 mg total) by mouth 2 (two) times daily for 7 days.    Dispense:  14 tablet    Refill:  0    Order Specific Question:   Supervising Provider    Answer:   Lynnea Ferrier T [[id number]]    Return if symptoms worsen or fail to improve.  Park Meo, FNP

## 2023-04-14 ENCOUNTER — Ambulatory Visit: Admission: RE | Admit: 2023-04-14 | Payer: Medicare PPO | Source: Ambulatory Visit

## 2023-04-15 ENCOUNTER — Encounter: Payer: Self-pay | Admitting: Family Medicine

## 2023-04-15 ENCOUNTER — Ambulatory Visit: Payer: Medicare PPO | Admitting: Family Medicine

## 2023-04-15 ENCOUNTER — Other Ambulatory Visit: Payer: Self-pay | Admitting: Family Medicine

## 2023-04-15 VITALS — BP 172/76 | HR 96 | Temp 98.0°F | Resp 18 | Ht <= 58 in | Wt 126.0 lb

## 2023-04-15 DIAGNOSIS — N3001 Acute cystitis with hematuria: Secondary | ICD-10-CM

## 2023-04-15 DIAGNOSIS — T50905A Adverse effect of unspecified drugs, medicaments and biological substances, initial encounter: Secondary | ICD-10-CM

## 2023-04-15 LAB — URINE CULTURE
MICRO NUMBER:: 14984580
SPECIMEN QUALITY:: ADEQUATE

## 2023-04-15 MED ORDER — PREDNISONE 20 MG PO TABS: ORAL_TABLET | ORAL | 0 refills | Status: DC

## 2023-04-15 MED ORDER — METHYLPREDNISOLONE ACETATE 40 MG/ML IJ SUSP
60.0000 mg | Freq: Once | INTRAMUSCULAR | Status: DC
Start: 2023-04-15 — End: 2023-09-30

## 2023-04-15 MED ORDER — NITROFURANTOIN MONOHYD MACRO 100 MG PO CAPS: 100.0000 mg | ORAL_CAPSULE | Freq: Two times a day (BID) | ORAL | 0 refills | Status: DC

## 2023-04-15 NOTE — Progress Notes (Signed)
Acute Office Visit  Subjective:     Patient ID: Felicia [name], female    DOB: 1965-11-29, 57 y.o.   MRN: [id number]  Chief Complaint  Patient presents with   Acute Visit    Possible allergic reaction to abx. Rash/itching since starting abx this am. Advised to take Benadryl.    HPI Patient is in today for rash, dizziness, vomiting, and shortness of breath after taking her first dose of Ciprofloxacin this AM. She took the Cipro at around 10 am and broke out in a rash, she also reports her throat felt "thick" and her lips were swelling, she vomited soon after and those symptoms have subsided. She is currently experiencing ongoing shortness of breath and dizziness.   Review of Systems  All other systems reviewed and are negative.       Objective:    BP (!) 172/76 (BP Location: Left Arm, Patient Position: Sitting, Cuff Size: Normal)   Pulse 96   Temp 98 F (36.7 C)   Resp 18   Ht 4\' 9"  (1.448 m)   Wt 126 lb (57.2 kg)   LMP 12/26/2012   SpO2 96%   BMI 27.27 kg/m    Physical Exam Vitals and nursing note reviewed.  Constitutional:      Appearance: Normal appearance. She is normal weight.  HENT:     Head: Normocephalic and atraumatic.  Cardiovascular:     Rate and Rhythm: Normal rate and regular rhythm.     Pulses: Normal pulses.     Heart sounds: Normal heart sounds.  Pulmonary:     Effort: Pulmonary effort is normal. No tachypnea or respiratory distress.     Breath sounds: Decreased breath sounds present.  Skin:    General: Skin is warm and dry.  Neurological:     General: No focal deficit present.     Mental Status: She is alert and oriented to person, place, and time. Mental status is at baseline.  Psychiatric:        Mood and Affect: Mood normal.        Behavior: Behavior normal.        Thought Content: Thought content normal.        Judgment: Judgment normal.     No results found for any visits on 04/15/23.      Assessment & Plan:   Problem List  Items Addressed This Visit     Acute cystitis with hematuria - Primary    Reaction to Cipro, will switch to Macrobid 100mg  BID for 5 days. CrCl 69.       Drug reaction    No other new medications, food, or allergen exposure. Cipro added to allergy list. Administered albuterol nebulizer in office for c/o shortness of breath. 60mg  IM depo-medrol administered in office. Will start Prednisone taper PO tomorrow.      Relevant Medications   methylPREDNISolone acetate (DEPO-MEDROL) injection 60 mg    Meds ordered this encounter  Medications   nitrofurantoin, macrocrystal-monohydrate, (MACROBID) 100 MG capsule    Sig: Take 1 capsule (100 mg total) by mouth 2 (two) times daily.    Dispense:  10 capsule    Refill:  0    Order Specific Question:   Supervising Provider    Answer:   Lynnea Ferrier T [[id number]]   predniSONE (DELTASONE) 20 MG tablet    Sig: 3 tabs poqday 1-2, 2 tabs poqday 3-4, 1 tab poqday 5-6    Dispense:  12 tablet    Refill:  0    Order Specific Question:   Supervising Provider    Answer:   Lynnea Ferrier T [[id number]]   methylPREDNISolone acetate (DEPO-MEDROL) injection 60 mg    Return if symptoms worsen or fail to improve.  Park Meo, FNP

## 2023-04-15 NOTE — Assessment & Plan Note (Addendum)
No other new medications, food, or allergen exposure. Cipro added to allergy list. Administered albuterol nebulizer in office for c/o shortness of breath. 60mg  IM depo-medrol administered in office. Will start Prednisone taper PO tomorrow.

## 2023-04-15 NOTE — Assessment & Plan Note (Signed)
Reaction to Cipro, will switch to Macrobid 100mg  BID for 5 days. CrCl 69.

## 2023-04-28 ENCOUNTER — Other Ambulatory Visit: Payer: Self-pay | Admitting: Family Medicine

## 2023-05-04 ENCOUNTER — Telehealth: Payer: Self-pay

## 2023-05-04 NOTE — Telephone Encounter (Signed)
Pt called in wanting to ask nurse a question about taking antibiotics. Pt had an OV with NP on 04/13/23. Pt states that she is still having symptoms of a bladder infection and wanted to know if she should get some more antibiotics sent in: Please advise.  Cb#: [phone]

## 2023-05-30 ENCOUNTER — Other Ambulatory Visit: Payer: Self-pay | Admitting: Family Medicine

## 2023-05-31 NOTE — Telephone Encounter (Signed)
Requested medications are due for refill today.  yes  Requested medications are on the active medications list.  yes  Last refill. 06/11/2022 #90 3 rf  Future visit scheduled.   no  Notes to clinic.  Labs are about to expire.    Requested Prescriptions  Pending Prescriptions Disp Refills   meloxicam (MOBIC) 15 MG tablet [Pharmacy Med Name: MELOXICAM 15 MG TABLET] 90 tablet 3    Sig: Take 1 tablet (15 mg total) by mouth daily.     Analgesics:  COX2 Inhibitors Failed - 05/30/2023  3:05 PM      Failed - Manual Review: Labs are only required if the patient has taken medication for more than 8 weeks.      Failed - Valid encounter within last 12 months    Recent Outpatient Visits           1 year ago Acute cystitis with hematuria   [facility] Valentino Nose, NP   1 year ago General medical exam   [facility] Donita Brooks, MD   1 year ago Acute midline low back pain with right-sided sciatica   [facility] Donita Brooks, MD   2 years ago Ventral hernia without obstruction or gangrene   [facility] Donita Brooks, MD   2 years ago Rib pain on left side   [facility] Pickard, Priscille Heidelberg, MD              Passed - HGB in normal range and within 360 days    Hemoglobin  Date Value Ref Range Status  06/09/2022 13.4 11.7 - 15.5 g/dL Final         Passed - Cr in normal range and within 360 days    Creat  Date Value Ref Range Status  06/09/2022 0.87 0.50 - 1.03 mg/dL Final         Passed - HCT in normal range and within 360 days    HCT  Date Value Ref Range Status  06/09/2022 39.8 35.0 - 45.0 % Final         Passed - AST in normal range and within 360 days    AST  Date Value Ref Range Status  06/09/2022 16 10 - 35 U/L Final         Passed - ALT in normal range and within 360 days    ALT  Date Value Ref Range Status  06/09/2022 16 6 - 29 U/L Final         Passed - eGFR  is 30 or above and within 360 days    GFR, Est African American  Date Value Ref Range Status  12/20/2020 73 > OR = 60 mL/min/1.2m2 Final   GFR, Est Non African American  Date Value Ref Range Status  12/20/2020 63 > OR = 60 mL/min/1.68m2 Final   GFR, Estimated  Date Value Ref Range Status  07/31/2021 53 (L) >60 mL/min Final    Comment:    (NOTE) Calculated using the CKD-EPI Creatinine Equation (2021)    eGFR  Date Value Ref Range Status  06/09/2022 77 > OR = 60 mL/min/1.47m2 Final    Comment:    The eGFR is based on the CKD-EPI 2021 equation. To calculate  the new eGFR from a previous Creatinine or Cystatin C result, go to https://www.kidney.org/professionals/ kdoqi/gfr%5Fcalculator          Passed - Patient is not pregnant

## 2023-06-03 ENCOUNTER — Ambulatory Visit
Admission: EM | Admit: 2023-06-03 | Discharge: 2023-06-03 | Disposition: A | Discharge status: Home / Self Care | Payer: Medicare PPO | Attending: Nurse Practitioner | Admitting: Nurse Practitioner

## 2023-06-03 DIAGNOSIS — N3001 Acute cystitis with hematuria: Secondary | ICD-10-CM | POA: Diagnosis not present

## 2023-06-03 DIAGNOSIS — R051 Acute cough: Secondary | ICD-10-CM | POA: Insufficient documentation

## 2023-06-03 DIAGNOSIS — F17200 Nicotine dependence, unspecified, uncomplicated: Secondary | ICD-10-CM | POA: Insufficient documentation

## 2023-06-03 LAB — POCT URINALYSIS DIP (MANUAL ENTRY)
Bilirubin, UA: NEGATIVE
Glucose, UA: NEGATIVE mg/dL
Ketones, POC UA: NEGATIVE mg/dL
Leukocytes, UA: NEGATIVE
Nitrite, UA: POSITIVE — AB
Protein Ur, POC: >=300 mg/dL — AB
Spec Grav, UA: >=1.03 — AB (ref 1.010–1.025)
Urobilinogen, UA: 0.2 E.U./dL
pH, UA: 6 (ref 5.0–8.0)

## 2023-06-03 MED ORDER — BENZONATATE 100 MG PO CAPS: 100.0000 mg | ORAL_CAPSULE | Freq: Three times a day (TID) | ORAL | 0 refills | Status: DC | PRN

## 2023-06-03 MED ORDER — AZITHROMYCIN 250 MG PO TABS: 250.0000 mg | ORAL_TABLET | Freq: Every day | ORAL | 0 refills | Status: DC

## 2023-06-03 MED ORDER — NITROFURANTOIN MONOHYD MACRO 100 MG PO CAPS: 100.0000 mg | ORAL_CAPSULE | Freq: Two times a day (BID) | ORAL | 0 refills | Status: AC

## 2023-06-03 MED ORDER — PHENAZOPYRIDINE HCL 100 MG PO TABS: 100.0000 mg | ORAL_TABLET | Freq: Three times a day (TID) | ORAL | 0 refills | Status: DC | PRN

## 2023-06-03 NOTE — ED Triage Notes (Signed)
Pt reports she has lost her voice, nasal congestion, headache and hot flashes x  1 week.   Pt reports frequent urination, dark colored urine with foul odor, and bladder pain x 2 weeks.   Pcp told her to wait a week and drink cranberry juice.

## 2023-06-03 NOTE — ED Provider Notes (Signed)
[facility]    CSN: [id number] Arrival date & time: 06/03/23  1613      History   Chief Complaint No chief complaint on file.   HPI Felicia [name] is a 57 y.o. female.   Patient presents today for 10-day history of dysuria, urinary frequency and urinary urgency, voiding smaller amounts, and foul urinary odor.  She also endorses suprapubic pain and low back pain across her entire low back.  No hematuria, new urinary incontinence, fever, nausea/vomiting, or vaginal discharge.  She has been taking Aleve, increasing water intake, and has tried cranberry juice without much improvement.  Reports last UTI was approximately 2 months ago when she was treated with Cipro which gave her an allergic reaction and was changed to [facility] which she tolerated well.  Patient also concerned about 1 week history of cough, shortness of breath and wheezing, runny and stuffy nose, sore throat, sinus pressure, headache, decreased appetite, and fatigue.  She denies fever, body aches or chills, chest tightness, ear pain, abdominal pain, nausea/vomiting, and diarrhea.  Has not taken anything for upper respiratory symptoms so far.  Reports she is currently working on quitting smoking and has only been smoking 1 to 2 cigarettes/day.  She does have a history of asthma and has been using her rescue inhaler more frequently which does seem to help temporarily.     Past Medical History:  Diagnosis Date   Allergy    year around   Anemia    Anxiety    Arthritis    Asthma    Atrial fibrillation (HCC)    Bipolar 1 disorder (HCC)    Borderline diabetes    Depression    Early cataract    right   Ectopic pregnancy    Essential hypertension 04/13/2023   Fatty liver    Headache    Heart murmur    Hyperlipidemia    Insomnia    Peripheral arterial disease (HCC)    Pneumonia    PUD (peptic ulcer disease)    Right knee pain 12-05-2015   per pt.  she fell out of her husband's truck and landed on her  right knee   Shortness of breath dyspnea    Wears glasses    Wears partial dentures     Patient Active Problem List   Diagnosis Date Noted   Drug reaction 04/15/2023   Acute cystitis with hematuria 04/13/2023   Essential hypertension 04/13/2023   Incisional hernia, without obstruction or gangrene 01/23/2021   Chronic left shoulder pain 09/13/2019   Partial tear of left subscapularis tendon 08/01/2019   Low back strain 05/08/2015   Asthma, chronic 05/08/2015   Atherosclerosis of native artery of both lower extremities with intermittent claudication (HCC) 10/23/2014   PVD (peripheral vascular disease) with claudication (HCC) 09/26/2014   Atherosclerosis of native arteries of extremity with intermittent claudication (HCC) 09/14/2012   PUD (peptic ulcer disease)    Hyperlipidemia     Past Surgical History:  Procedure Laterality Date   ABDOMINAL AORTAGRAM N/A 09/12/2012   Procedure: ABDOMINAL Ronny Flurry;  Surgeon: Fransisco Hertz, MD;  Location: [facility] CATH LAB;  Service: Cardiovascular;  Laterality: N/A;   AORTA - BILATERAL FEMORAL ARTERY BYPASS GRAFT N/A 10/23/2014   Procedure: AORTOBIFEMORAL BYPASS GRAFT;  Surgeon: Chuck Hint, MD;  Location[facility];  Service: Vascular;  Laterality: N/A;   BACK SURGERY     CESAREAN SECTION     COLONOSCOPY  01-14-2004   > 11 yr ago Dr Loreta Ave -  normal - report in epic    DILATATION & CURETTAGE/HYSTEROSCOPY WITH MYOSURE N/A 10/15/2015   Procedure: DILATATION & CURETTAGE/HYSTEROSCOPY WITH MYOSURE;  Surgeon: Ok Edwards, MD;  Location: [facility] ORS;  Service: Gynecology;  Laterality: N/A;   ESOPHAGOGASTRODUODENOSCOPY  01-14-2004   gastritis- dr Loreta Ave- in epic already    [facility] HERNIA REPAIR N/A 08/01/2021   Procedure: INCISIONAL HERNIA REPAIR TIMES TWO, ONE WITH VENTRALEX ST HERNIA MESH, THE SECOND BY PRIMARY CLOSURE;  Surgeon: Lucretia Roers, MD;  Location: [facility] ORS;  Service: General;  Laterality: N/A;   MULTIPLE TOOTH EXTRACTIONS     NOSE SURGERY      NOSE SURGERY     SHOULDER ARTHROSCOPY WITH SUBACROMIAL DECOMPRESSION, ROTATOR CUFF REPAIR AND BICEP TENDON REPAIR Left 09/26/2019   Procedure: left shoulder arthroscopy, biceps release and tenodesis, mini open rotator cuff tear repair subscapularis and supraspinatus;  Surgeon: Cammy Copa, MD;  Location: [facility] OR;  Service: Orthopedics;  Laterality: Left;   SHOULDER SURGERY     rotator cuff repair    TONSILLECTOMY     TUBAL LIGATION     UPPER GASTROINTESTINAL ENDOSCOPY  01-14-2004   in epic- dr Loreta Ave- gastritis     OB History     Gravida  3   Para  2   Term      Preterm      AB  1   Living  2      SAB      IAB      Ectopic  1   Multiple      Live Births               Home Medications    Prior to Admission medications   Medication Sig Start Date End Date Taking? Authorizing Provider  azithromycin (ZITHROMAX) 250 MG tablet Take 1 tablet (250 mg total) by mouth daily. Take first 2 tablets together, then 1 every day until finished. 06/03/23  Yes Valentino Nose, NP  benzonatate (TESSALON) 100 MG capsule Take 1 capsule (100 mg total) by mouth 3 (three) times daily as needed for cough. Do not take with alcohol or while driving or operating heavy machinery.  May cause drowsiness. 06/03/23  Yes Valentino Nose, NP  nitrofurantoin, macrocrystal-monohydrate, (MACROBID) 100 MG capsule Take 1 capsule (100 mg total) by mouth 2 (two) times daily for 5 days. 06/03/23 06/08/23 Yes Valentino Nose, NP  phenazopyridine (PYRIDIUM) 100 MG tablet Take 1 tablet (100 mg total) by mouth 3 (three) times daily as needed for pain (bladder pain). 06/03/23  Yes Valentino Nose, NP  albuterol (VENTOLIN HFA) 108 (90 Base) MCG/ACT inhaler INHALE 2 PUFFS INTO THE LUNGS EVERY 6 HOURS AS NEEDED FOR WHEEZE/SHORTNES OF BREATH 10/22/22   Donita Brooks, MD  aspirin EC 325 MG tablet Take 325 mg by mouth daily.    [provider]  atorvastatin (LIPITOR) 80 MG tablet Take 1  tablet (80 mg total) by mouth daily. 06/09/22   Donita Brooks, MD  clonazePAM (KLONOPIN) 0.5 MG tablet Take 2 tablets (1 mg total) by mouth 2 (two) times daily. 03/02/23   Donita Brooks, MD  cyanocobalamin (VITAMIN B12) 1000 MCG tablet Take 1 tablet (1,000 mcg total) by mouth daily. 03/01/23   Donita Brooks, MD  cyclobenzaprine (FLEXERIL) 5 MG tablet TAKE 1 TABLET BY MOUTH TWICE A DAY 04/28/23   Park Meo, FNP  diphenhydrAMINE (BENADRYL) 25 MG tablet Take 50 mg by mouth every 6 (six)  hours as needed for allergies.    [provider]  donepezil (ARICEPT) 5 MG tablet Take 1 tablet (5 mg total) by mouth at bedtime. 03/01/23   Donita Brooks, MD  EPIPEN 2-PAK 0.3 MG/0.3ML SOAJ injection USE AS DIRECTED Patient taking differently: Inject 0.3 mg into the muscle as needed for anaphylaxis. 04/29/16   Donita Brooks, MD  ezetimibe (ZETIA) 10 MG tablet Take 1 tablet (10 mg total) by mouth daily. 06/09/22   Donita Brooks, MD  hydrochlorothiazide (HYDRODIURIL) 25 MG tablet Take 1 tablet (25 mg total) by mouth daily. 06/09/22   Donita Brooks, MD  KLOR-CON M20 20 MEQ tablet TAKE 2 TABLETS BY MOUTH DAILY 10/22/22   Donita Brooks, MD  meloxicam (MOBIC) 15 MG tablet TAKE 1 TABLET (15 MG TOTAL) BY MOUTH DAILY. 05/31/23   Donita Brooks, MD  metoprolol succinate (TOPROL-XL) 50 MG 24 hr tablet Take 1 tablet (50 mg total) by mouth daily. Take with or immediately following a meal. 06/09/22   Donita Brooks, MD  predniSONE (DELTASONE) 20 MG tablet 3 tabs poqday 1-2, 2 tabs poqday 3-4, 1 tab poqday 5-6 04/16/23   Park Meo, FNP    Family History Family History  Adopted: Yes  Problem Relation Age of Onset   Heart disease Mother    Diabetes Mother    Hyperlipidemia Mother    Hypertension Mother    Heart attack Mother    Heart disease Father    Diabetes Father    Heart attack Father    Hypertension Father    Bladder Cancer Father    Diabetes Brother    Lung cancer Paternal  Grandfather    Colon cancer Neg Hx    Colon polyps Neg Hx    Rectal cancer Neg Hx    Stomach cancer Neg Hx     Social History Social History   Tobacco Use   Smoking status: Every Day    Current packs/day: 0.50    Average packs/day: 0.5 packs/day for 42.3 years (21.2 ttl pk-yrs)    Types: Cigarettes    Start date: 01/29/1981   Smokeless tobacco: Never  Vaping Use   Vaping status: Never Used  Substance Use Topics   Alcohol use: Yes    Alcohol/week: 1.0 standard drink of alcohol    Types: 1 Shots of liquor per week    Comment: very occassionally on social occassions   Drug use: No     Allergies   Bee pollen, Ciprofloxacin, Bactrim [sulfamethoxazole-trimethoprim], Cephalosporins, Doxycycline, Tetracyclines & related, Other, Augmentin [amoxicillin-pot clavulanate], Codeine, and Reflex pain relief [menthol]   Review of Systems Review of Systems Per HPI  Physical Exam Triage Vital Signs ED Triage Vitals  Encounter Vitals Group     BP 06/03/23 1616 (!) 185/80     Systolic BP Percentile --      Diastolic BP Percentile --      Pulse Rate 06/03/23 1616 95     Resp 06/03/23 1616 20     Temp 06/03/23 1616 97.8 F (36.6 C)     Temp Source 06/03/23 1616 Oral     SpO2 06/03/23 1616 96 %     Weight --      Height --      Head Circumference --      Peak Flow --      Pain Score 06/03/23 1619 6     Pain Loc --      Pain Education --  Exclude from Growth Chart --    No data found.  Updated Vital Signs BP (!) 185/80 (BP Location: Right Arm)   Pulse 95   Temp 97.8 F (36.6 C) (Oral)   Resp 20   LMP 12/26/2012   SpO2 96%   Visual Acuity Right Eye Distance:   Left Eye Distance:   Bilateral Distance:    Right Eye Near:   Left Eye Near:    Bilateral Near:     Physical Exam Vitals and nursing note reviewed.  Constitutional:      General: She is not in acute distress.    Appearance: Normal appearance. She is not ill-appearing or toxic-appearing.  HENT:      Head: Normocephalic and atraumatic.     Right Ear: Tympanic membrane, ear canal and external ear normal.     Left Ear: Tympanic membrane, ear canal and external ear normal.     Nose: No congestion or rhinorrhea.     Mouth/Throat:     Mouth: Mucous membranes are moist.     Pharynx: Oropharynx is clear. No oropharyngeal exudate or posterior oropharyngeal erythema.  Eyes:     General: No scleral icterus.    Extraocular Movements: Extraocular movements intact.  Cardiovascular:     Rate and Rhythm: Normal rate and regular rhythm.  Pulmonary:     Effort: Pulmonary effort is normal. No respiratory distress.     Breath sounds: Wheezing (faint wheezing) present. No rhonchi or rales.  Abdominal:     General: Abdomen is flat. Bowel sounds are normal. There is no distension.     Palpations: Abdomen is soft.     Tenderness: There is no abdominal tenderness. There is no right CVA tenderness, left CVA tenderness, guarding or rebound.  Musculoskeletal:     Cervical back: Normal range of motion and neck supple.  Lymphadenopathy:     Cervical: No cervical adenopathy.  Skin:    General: Skin is warm and dry.     Coloration: Skin is not jaundiced or pale.     Findings: No erythema or rash.  Neurological:     Mental Status: She is alert and oriented to person, place, and time.  Psychiatric:        Behavior: Behavior is cooperative.      UC Treatments / Results  Labs (all labs ordered are listed, but only abnormal results are displayed) Labs Reviewed  POCT URINALYSIS DIP (MANUAL ENTRY) - Abnormal; Notable for the following components:      Result Value   Spec Grav, UA >=1.030 (*)    Blood, UA moderate (*)    Protein Ur, POC >=300 (*)    Nitrite, UA Positive (*)    All other components within normal limits  URINE CULTURE    EKG   Radiology No results found.  Procedures Procedures (including critical care time)  Medications Ordered in UC Medications - No data to display  Initial  Impression / Assessment and Plan / UC Course  I have reviewed the triage vital signs and the nursing notes.  Pertinent labs & imaging results that were available during my care of the patient were reviewed by me and considered in my medical decision making (see chart for details).   Patient is well-appearing, normotensive, afebrile, not tachycardic, not tachypneic, oxygenating well on room air.    1. Acute cystitis with hematuria Urinalysis today with moderate blood, positive nitrites Urine culture is pending Patient has significant antibiotic allergies including cephalosporins, penicillin, Bactrim, and doxycycline Will  treat with Macrobid twice daily for 5 days Start Pyridium as needed for bladder pain Strict ER precautions discussed with patient  2. Acute cough 3. Current every day smoker Given pain expiratory wheezing, increase in use of bronchodilator, suspect some chronic lung disease exacerbation Treat with azithromycin, antitussive, guaifenesin, continue increasing water intake Follow-up with PCP with no improvement in symptoms despite treatment ER precautions reviewed  The patient was given the opportunity to ask questions.  All questions answered to their satisfaction.  The patient is in agreement to this plan.    Final Clinical Impressions(s) / UC Diagnoses   Final diagnoses:  Acute cystitis with hematuria  Acute cough  Current every day smoker     Discharge Instructions      The urinalysis today shows you have a urinary tract infection.  Take the Macrobid as prescribed to treat it.  You can take the Pyridium every 8 hours as needed for bladder pain-this will dye urine bright orange.  The Pyridium will not cure UTI, but it will make your symptoms better.  Continue drinking plenty of water.  For your cough and congestion, start the azithromycin and take it as prescribed.  Also start taking Mucinex 600 mg at least twice daily to help break up the congestion.  Continue  pushing hydration with plenty of fluids, start nasal saline rinses, and running a humidifier.  You can use the [employer] as needed for dry cough.     ED Prescriptions     Medication Sig Dispense Auth. Provider   nitrofurantoin, macrocrystal-monohydrate, (MACROBID) 100 MG capsule Take 1 capsule (100 mg total) by mouth 2 (two) times daily for 5 days. 10 capsule Cathlean Marseilles A, NP   phenazopyridine (PYRIDIUM) 100 MG tablet Take 1 tablet (100 mg total) by mouth 3 (three) times daily as needed for pain (bladder pain). 10 tablet Cathlean Marseilles A, NP   azithromycin (ZITHROMAX) 250 MG tablet Take 1 tablet (250 mg total) by mouth daily. Take first 2 tablets together, then 1 every day until finished. 6 tablet Cathlean Marseilles A, NP   benzonatate (TESSALON) 100 MG capsule Take 1 capsule (100 mg total) by mouth 3 (three) times daily as needed for cough. Do not take with alcohol or while driving or operating heavy machinery.  May cause drowsiness. 21 capsule Valentino Nose, NP      PDMP not reviewed this encounter.   Valentino Nose, NP 06/03/23 [phone]

## 2023-06-03 NOTE — Discharge Instructions (Signed)
The urinalysis today shows you have a urinary tract infection.  Take the Macrobid as prescribed to treat it.  You can take the Pyridium every 8 hours as needed for bladder pain-this will dye urine bright orange.  The Pyridium will not cure UTI, but it will make your symptoms better.  Continue drinking plenty of water.  For your cough and congestion, start the azithromycin and take it as prescribed.  Also start taking Mucinex 600 mg at least twice daily to help break up the congestion.  Continue pushing hydration with plenty of fluids, start nasal saline rinses, and running a humidifier.  You can use the [employer] as needed for dry cough.

## 2023-06-05 LAB — URINE CULTURE

## 2023-06-06 LAB — URINE CULTURE: Culture: >=100000 — AB

## 2023-06-09 ENCOUNTER — Other Ambulatory Visit: Payer: Self-pay | Admitting: Family Medicine

## 2023-06-10 ENCOUNTER — Other Ambulatory Visit: Payer: Self-pay | Admitting: Family Medicine

## 2023-06-10 NOTE — Telephone Encounter (Signed)
Unable to refill per protocol, Rx request is too soon. Last refill 03/01/23 for 90 and 1 refill.E-Prescribing Status: Receipt confirmed by pharmacy (03/01/2023  2:24 PM EDT).  Requested Prescriptions  Pending Prescriptions Disp Refills   donepezil (ARICEPT) 5 MG tablet [Pharmacy Med Name: DONEPEZIL HCL 5 MG TABLET] 90 tablet 1    Sig: TAKE 1 TABLET BY MOUTH EVERYDAY AT BEDTIME     Neurology:  Alzheimer's Agents Failed - 06/09/2023  5:09 PM      Failed - Valid encounter within last 6 months    Recent Outpatient Visits           1 year ago Acute cystitis with hematuria   [facility] Valentino Nose, NP   1 year ago General medical exam   [facility] Donita Brooks, MD   1 year ago Acute midline low back pain with right-sided sciatica   [facility] Donita Brooks, MD   2 years ago Ventral hernia without obstruction or gangrene   [facility] Donita Brooks, MD   2 years ago Rib pain on left side   [facility] Pickard, Priscille Heidelberg, MD

## 2023-06-11 ENCOUNTER — Other Ambulatory Visit: Payer: Self-pay

## 2023-06-11 ENCOUNTER — Other Ambulatory Visit: Payer: Self-pay | Admitting: Family Medicine

## 2023-06-11 DIAGNOSIS — I10 Essential (primary) hypertension: Secondary | ICD-10-CM

## 2023-06-11 MED ORDER — POTASSIUM CHLORIDE CRYS ER 20 MEQ PO TBCR
40.0000 meq | EXTENDED_RELEASE_TABLET | Freq: Every day | ORAL | 1 refills | Status: DC
Start: 2023-06-11 — End: 2023-09-30

## 2023-06-11 NOTE — Telephone Encounter (Signed)
Requested Prescriptions  Refused Prescriptions Disp Refills   potassium chloride SA (KLOR-CON M20) 20 MEQ tablet 180 tablet 1    Sig: Take 2 tablets (40 mEq total) by mouth daily.     Endocrinology:  Minerals - Potassium Supplementation Failed - 06/11/2023 11:43 AM      Failed - K in normal range and within 360 days    Potassium  Date Value Ref Range Status  06/09/2022 4.1 3.5 - 5.3 mmol/L Final         Failed - Cr in normal range and within 360 days    Creat  Date Value Ref Range Status  06/09/2022 0.87 0.50 - 1.03 mg/dL Final         Failed - Valid encounter within last 12 months    Recent Outpatient Visits           1 year ago Acute cystitis with hematuria   [facility] Valentino Nose, NP   1 year ago General medical exam   [facility] Donita Brooks, MD   1 year ago Acute midline low back pain with right-sided sciatica   [facility] Donita Brooks, MD   2 years ago Ventral hernia without obstruction or gangrene   [facility] Donita Brooks, MD   2 years ago Rib pain on left side   [facility] Medicine Pickard, Priscille Heidelberg, MD

## 2023-06-11 NOTE — Telephone Encounter (Signed)
Prescription Request  06/11/2023  LOV: 03/01/2023  What is the name of the medication or equipment?   KLOR-CON M20 20 MEQ tablet   Have you contacted your pharmacy to request a refill? Yes   Which pharmacy would you like this sent to?  [id number] [place], [place] - [address] [place] [address] Phone: [phone] Fax: [phone]    Patient notified that their request is being sent to the clinical staff for review and that they should receive a response within 2 business days.   Please advise pharmacist

## 2023-06-11 NOTE — Telephone Encounter (Signed)
Requested Prescriptions  Pending Prescriptions Disp Refills   atorvastatin (LIPITOR) 80 MG tablet [Pharmacy Med Name: ATORVASTATIN 80 MG TABLET] 90 tablet 3    Sig: TAKE 1 TABLET BY MOUTH EVERY DAY     Cardiovascular:  Antilipid - Statins Failed - 06/10/2023  8:24 PM      Failed - Valid encounter within last 12 months    Recent Outpatient Visits           1 year ago Acute cystitis with hematuria   [facility] Valentino Nose, NP   1 year ago General medical exam   [facility] Donita Brooks, MD   1 year ago Acute midline low back pain with right-sided sciatica   [facility] Medicine Donita Brooks, MD   2 years ago Ventral hernia without obstruction or gangrene   [facility] Donita Brooks, MD   2 years ago Rib pain on left side   [facility] Medicine Pickard, Priscille Heidelberg, MD              Failed - Lipid Panel in normal range within the last 12 months    Cholesterol  Date Value Ref Range Status  06/09/2022 169 <200 mg/dL Final   LDL Cholesterol (Calc)  Date Value Ref Range Status  06/09/2022 101 (H) mg/dL (calc) Final    Comment:    Reference range: <100 . Desirable range <100 mg/dL for primary prevention;   <70 mg/dL for patients with CHD or diabetic patients  with > or = 2 CHD risk factors. Marland Kitchen LDL-C is now calculated using the Martin-Hopkins  calculation, which is a validated novel method providing  better accuracy than the Friedewald equation in the  estimation of LDL-C.  Horald Pollen et al. Lenox Ahr. 1610;960(45): 2061-2068  (http://education.[facility].com/faq/FAQ164)    HDL  Date Value Ref Range Status  06/09/2022 38 (L) > OR = 50 mg/dL Final   Triglycerides  Date Value Ref Range Status  06/09/2022 179 (H) <150 mg/dL Final         Passed - Patient is not pregnant       metoprolol succinate (TOPROL-XL) 50 MG 24 hr tablet [Pharmacy Med Name: METOPROLOL SUCC ER 50 MG TAB] 90  tablet 0    Sig: TAKE 1 TABLET BY MOUTH DAILY. TAKE WITH OR IMMEDIATELY FOLLOWING A MEAL.     Cardiovascular:  Beta Blockers Failed - 06/10/2023  8:24 PM      Failed - Last BP in normal range    BP Readings from Last 1 Encounters:  06/03/23 (!) 185/80         Failed - Valid encounter within last 6 months    Recent Outpatient Visits           1 year ago Acute cystitis with hematuria   [facility] Valentino Nose, NP   1 year ago General medical exam   [facility] Donita Brooks, MD   1 year ago Acute midline low back pain with right-sided sciatica   [facility] Donita Brooks, MD   2 years ago Ventral hernia without obstruction or gangrene   [facility] Medicine Donita Brooks, MD   2 years ago Rib pain on left side   [facility] Pickard, Priscille Heidelberg, MD              Passed - Last Heart Rate in normal range  Pulse Readings from Last 1 Encounters:  06/03/23 95         Refused Prescriptions Disp Refills   donepezil (ARICEPT) 5 MG tablet [Pharmacy Med Name: DONEPEZIL HCL 5 MG TABLET] 90 tablet 1    Sig: TAKE 1 TABLET BY MOUTH EVERYDAY AT BEDTIME     Neurology:  Alzheimer's Agents Failed - 06/10/2023  8:24 PM      Failed - Valid encounter within last 6 months    Recent Outpatient Visits           1 year ago Acute cystitis with hematuria   [facility] Valentino Nose, NP   1 year ago General medical exam   [facility] Medicine Donita Brooks, MD   1 year ago Acute midline low back pain with right-sided sciatica   [facility] Family Medicine Donita Brooks, MD   2 years ago Ventral hernia without obstruction or gangrene   [facility] Medicine Donita Brooks, MD   2 years ago Rib pain on left side   [facility] Medicine Pickard, Priscille Heidelberg, MD

## 2023-06-11 NOTE — Telephone Encounter (Signed)
Requested medications are due for refill today.  yes  Requested medications are on the active medications list.  yes  Last refill. 06/09/2022 #90 3 rf  Future visit scheduled.   no  Notes to clinic.  Labs are expired.    Requested Prescriptions  Pending Prescriptions Disp Refills   atorvastatin (LIPITOR) 80 MG tablet [Pharmacy Med Name: ATORVASTATIN 80 MG TABLET] 90 tablet 3    Sig: TAKE 1 TABLET BY MOUTH EVERY DAY     Cardiovascular:  Antilipid - Statins Failed - 06/10/2023  8:24 PM      Failed - Valid encounter within last 12 months    Recent Outpatient Visits           1 year ago Acute cystitis with hematuria   [facility] Valentino Nose, NP   1 year ago General medical exam   [facility] Donita Brooks, MD   1 year ago Acute midline low back pain with right-sided sciatica   [facility] Medicine Donita Brooks, MD   2 years ago Ventral hernia without obstruction or gangrene   [facility] Medicine Donita Brooks, MD   2 years ago Rib pain on left side   [facility] Medicine Pickard, Priscille Heidelberg, MD              Failed - Lipid Panel in normal range within the last 12 months    Cholesterol  Date Value Ref Range Status  06/09/2022 169 <200 mg/dL Final   LDL Cholesterol (Calc)  Date Value Ref Range Status  06/09/2022 101 (H) mg/dL (calc) Final    Comment:    Reference range: <100 . Desirable range <100 mg/dL for primary prevention;   <70 mg/dL for patients with CHD or diabetic patients  with > or = 2 CHD risk factors. Marland Kitchen LDL-C is now calculated using the Martin-Hopkins  calculation, which is a validated novel method providing  better accuracy than the Friedewald equation in the  estimation of LDL-C.  Horald Pollen et al. Lenox Ahr. 6160;737(10): 2061-2068  (http://education.[facility].com/faq/FAQ164)    HDL  Date Value Ref Range Status  06/09/2022 38 (L) > OR = 50 mg/dL Final   Triglycerides   Date Value Ref Range Status  06/09/2022 179 (H) <150 mg/dL Final         Passed - Patient is not pregnant      Signed Prescriptions Disp Refills   metoprolol succinate (TOPROL-XL) 50 MG 24 hr tablet 90 tablet 0    Sig: TAKE 1 TABLET BY MOUTH DAILY. TAKE WITH OR IMMEDIATELY FOLLOWING A MEAL.     Cardiovascular:  Beta Blockers Failed - 06/10/2023  8:24 PM      Failed - Last BP in normal range    BP Readings from Last 1 Encounters:  06/03/23 (!) 185/80         Failed - Valid encounter within last 6 months    Recent Outpatient Visits           1 year ago Acute cystitis with hematuria   [facility] Medicine Valentino Nose, NP   1 year ago General medical exam   [facility] Donita Brooks, MD   1 year ago Acute midline low back pain with right-sided sciatica   [facility] Family Medicine Donita Brooks, MD   2 years ago Ventral hernia without obstruction or gangrene   [facility] Medicine Pickard, Priscille Heidelberg, MD  2 years ago Rib pain on left side   [facility] Medicine Pickard, Priscille Heidelberg, MD              Passed - Last Heart Rate in normal range    Pulse Readings from Last 1 Encounters:  06/03/23 95         Refused Prescriptions Disp Refills   donepezil (ARICEPT) 5 MG tablet [Pharmacy Med Name: DONEPEZIL HCL 5 MG TABLET] 90 tablet 1    Sig: TAKE 1 TABLET BY MOUTH EVERYDAY AT BEDTIME     Neurology:  Alzheimer's Agents Failed - 06/10/2023  8:24 PM      Failed - Valid encounter within last 6 months    Recent Outpatient Visits           1 year ago Acute cystitis with hematuria   [facility] Medicine Valentino Nose, NP   1 year ago General medical exam   [facility] Medicine Donita Brooks, MD   1 year ago Acute midline low back pain with right-sided sciatica   [facility] Medicine Donita Brooks, MD   2 years ago Ventral hernia without obstruction or gangrene   [facility]  Medicine Donita Brooks, MD   2 years ago Rib pain on left side   [facility] Medicine Pickard, Priscille Heidelberg, MD

## 2023-06-17 ENCOUNTER — Other Ambulatory Visit: Payer: Self-pay | Admitting: Family Medicine

## 2023-06-17 MED ORDER — CYCLOBENZAPRINE HCL 5 MG PO TABS: 5.0000 mg | ORAL_TABLET | Freq: Two times a day (BID) | ORAL | 0 refills | Status: DC

## 2023-06-23 ENCOUNTER — Telehealth: Payer: Self-pay | Admitting: Family Medicine

## 2023-06-23 NOTE — Telephone Encounter (Signed)
Prescription Request  06/23/2023  LOV: 03/01/2023  What is the name of the medication or equipment?   ezetimibe (ZETIA) 10 MG tablet  **90 day script requested  Have you contacted your pharmacy to request a refill? Yes   Which pharmacy would you like this sent to?  [id number] [place], [place] - [address] [place] [address] Phone: [phone] Fax: [phone]    Patient notified that their request is being sent to the clinical staff for review and that they should receive a response within 2 business days.   Please advise pharmacist.

## 2023-07-11 ENCOUNTER — Other Ambulatory Visit: Payer: Self-pay | Admitting: Family Medicine

## 2023-07-13 NOTE — Telephone Encounter (Signed)
Requested Prescriptions  Pending Prescriptions Disp Refills   donepezil (ARICEPT) 5 MG tablet [Pharmacy Med Name: DONEPEZIL HCL 5 MG TABLET] 90 tablet 1    Sig: TAKE 1 TABLET BY MOUTH EVERYDAY AT BEDTIME     Neurology:  Alzheimer's Agents Failed - 07/11/2023  6:41 PM      Failed - Valid encounter within last 6 months    Recent Outpatient Visits           1 year ago Acute cystitis with hematuria   [facility] Medicine Valentino Nose, NP   1 year ago General medical exam   [facility] Medicine Donita Brooks, MD   1 year ago Acute midline low back pain with right-sided sciatica   [facility] Donita Brooks, MD   2 years ago Ventral hernia without obstruction or gangrene   [facility] Medicine Donita Brooks, MD   2 years ago Rib pain on left side   [facility] Medicine Pickard, Priscille Heidelberg, MD

## 2023-07-29 ENCOUNTER — Other Ambulatory Visit: Payer: Self-pay | Admitting: Family Medicine

## 2023-07-29 MED ORDER — CLONAZEPAM 1 MG PO TABS: 1.0000 mg | ORAL_TABLET | Freq: Three times a day (TID) | ORAL | 0 refills | Status: DC | PRN

## 2023-08-05 ENCOUNTER — Other Ambulatory Visit: Payer: Self-pay | Admitting: Family Medicine

## 2023-08-06 NOTE — Telephone Encounter (Signed)
Requested Prescriptions  Pending Prescriptions Disp Refills   donepezil (ARICEPT) 5 MG tablet [Pharmacy Med Name: DONEPEZIL HCL 5 MG TABLET] 90 tablet 0    Sig: TAKE 1 TABLET BY MOUTH EVERYDAY AT BEDTIME     Neurology:  Alzheimer's Agents Failed - 08/05/2023  7:57 PM      Failed - Valid encounter within last 6 months    Recent Outpatient Visits           1 year ago Acute cystitis with hematuria   [facility] Valentino Nose, NP   1 year ago General medical exam   [facility] Donita Brooks, MD   1 year ago Acute midline low back pain with right-sided sciatica   [facility] Medicine Donita Brooks, MD   2 years ago Ventral hernia without obstruction or gangrene   [facility] Medicine Donita Brooks, MD   3 years ago Rib pain on left side   [facility] Medicine Pickard, Priscille Heidelberg, MD

## 2023-09-02 ENCOUNTER — Telehealth: Payer: Self-pay | Admitting: Family Medicine

## 2023-09-02 NOTE — Telephone Encounter (Signed)
Prescription Request  09/02/2023  LOV: 03/01/2023  What is the name of the medication or equipment?   cyclobenzaprine (FLEXERIL) 5 MG tablet   Have you contacted your pharmacy to request a refill? Yes   Which pharmacy would you like this sent to?  [id number] [place], [place] - [address] [place] [address] Phone: [phone] Fax: [phone]    Patient notified that their request is being sent to the clinical staff for review and that they should receive a response within 2 business days.   Please advise pharmacist.

## 2023-09-03 ENCOUNTER — Other Ambulatory Visit: Payer: Self-pay | Admitting: Family Medicine

## 2023-09-03 MED ORDER — CYCLOBENZAPRINE HCL 5 MG PO TABS: 5.0000 mg | ORAL_TABLET | Freq: Two times a day (BID) | ORAL | 0 refills | Status: DC

## 2023-09-14 ENCOUNTER — Other Ambulatory Visit: Payer: Self-pay | Admitting: Family Medicine

## 2023-09-16 NOTE — Telephone Encounter (Signed)
Requested medication (s) are due for refill today: yes  Requested medication (s) are on the active medication list: yes  Last refill:  07/29/23  Future visit scheduled: no  Notes to clinic:  Unable to refill per protocol, cannot delegate.      Requested Prescriptions  Pending Prescriptions Disp Refills   clonazePAM (KLONOPIN) 1 MG tablet [Pharmacy Med Name: CLONAZEPAM 1 MG TABLET] 90 tablet 0    Sig: TAKE 1 TABLET BY MOUTH 3 TIMES DAILY AS NEEDED FOR ANXIETY.     Not Delegated - Psychiatry: Anxiolytics/Hypnotics 2 Failed - 09/14/2023  6:02 PM      Failed - This refill cannot be delegated      Failed - Urine Drug Screen completed in last 360 days      Failed - Valid encounter within last 6 months    Recent Outpatient Visits           1 year ago Acute cystitis with hematuria   [facility] Valentino Nose, NP   1 year ago General medical exam   [facility] Medicine Donita Brooks, MD   2 years ago Acute midline low back pain with right-sided sciatica   [facility] Family Medicine Donita Brooks, MD   2 years ago Ventral hernia without obstruction or gangrene   [facility] Medicine Donita Brooks, MD   3 years ago Rib pain on left side   [facility] Medicine Pickard, Priscille Heidelberg, MD              Passed - Patient is not pregnant

## 2023-09-27 ENCOUNTER — Telehealth: Payer: Self-pay | Admitting: Family Medicine

## 2023-09-27 NOTE — Telephone Encounter (Signed)
Left message to return call; patient needs to schedule medication refill appointment.

## 2023-09-30 ENCOUNTER — Encounter: Payer: Self-pay | Admitting: Family Medicine

## 2023-09-30 ENCOUNTER — Ambulatory Visit: Payer: Medicare PPO | Admitting: Family Medicine

## 2023-09-30 VITALS — BP 124/72 | HR 99 | Temp 98.6°F | Ht <= 58 in | Wt 127.6 lb

## 2023-09-30 DIAGNOSIS — I10 Essential (primary) hypertension: Secondary | ICD-10-CM

## 2023-09-30 DIAGNOSIS — E7800 Pure hypercholesterolemia, unspecified: Secondary | ICD-10-CM | POA: Diagnosis not present

## 2023-09-30 DIAGNOSIS — R413 Other amnesia: Secondary | ICD-10-CM | POA: Diagnosis not present

## 2023-09-30 DIAGNOSIS — R0989 Other specified symptoms and signs involving the circulatory and respiratory systems: Secondary | ICD-10-CM | POA: Diagnosis not present

## 2023-09-30 MED ORDER — LOSARTAN POTASSIUM 50 MG PO TABS: 50.0000 mg | ORAL_TABLET | Freq: Every day | ORAL | 3 refills | Status: AC

## 2023-09-30 NOTE — Progress Notes (Signed)
Subjective:     Patient ID: Felicia [name], female   DOB: [birth year], 57 y.o.   MRN: [id number] 02/15/23 Patient presents today with her husband.  They are both concerned over short-term memory loss.  Husband states that they will have the same conversation repeatedly without the patient being able to remember.  He will have to answer the same question multiple times as though the patient is unable to commit this to her short-term memory.  She is also leaving on appliances such as stove.  She left her dog outside last night.  She took the dog out to use the bathroom but then left it outside and forgot to bring it back in.  She is not getting lost driving however she is isolated at home caring for her elderly father and she very seldom interacts with other people.  Her husband thinks that she may be forgetting and taking her medication inappropriately by accident.  They deny any strokelike symptoms.  I performed a Mini-Mental status exam today.  The patient scored 27.  She got 1 incorrect on serial sevens.  She was unable to remember today's date.  She also could only remember 2 out of 3 objects on recall.  At that time, my plan was:  Patient certainly has short-term memory loss..  First I recommended we reduce her medications which could be contributing.  Discontinue Flexeril.  Reduce tramadol and Klonopin by 50% and recheck the patient in 2 weeks to see if there is any improvement.  Strongly recommended we try to wean her off all centrally acting medication.  Check a B12 level as well as a TSH and also obtain an MRI to evaluate for any vascular causes of dementia.  03/01/23 Patient's husband states that she still taking medication.  She did not stop the Flexeril.  She did reduce Klonopin slightly.  She is still taking tramadol.  I am concerned that she did not change the medication after we discussed that.  However he states that the memory loss seems to be getting worse.  MRI has not been scheduled.  TSH was  normal.  B12 was borderline low.  We called the patient and told her to start taking B12 but she never started the B12.  At that time my plan was: Proceed with MRI to determine if there is vascular dementia.  Begin Aricept 5 mg daily.  Discontinue tramadol.  Reduce Flexeril to 5 mg twice daily.  Reduce Klonopin to 0.5 mg twice daily.  Strongly encouraged the patient to try to cut back on these medications as they can affect her memory.  Recheck in 6 months  09/30/23 Patient is doing much better today.  She states that her memory has improved considerably.  However it seems that this may be more likely due to medication stress than any dementia.  She is not taking Aricept.  Her father has moved out of her house and is now living with her brother.  There was a great deal of conflict between her husband and her father.  This was putting a tremendous amount of stress on the patient.  It was also causing stress in her marriage.  Since her father has moved out and the stress has improved, the patient states that her brief is doing better but she still taking Klonopin twice a day.  She tried to use Flexeril sparingly.  She does get cramps a lot at night for which she takes for current.  She is on Klor-Con because  of the hydrochlorothiazide.   Past Medical History:  Diagnosis Date   Allergy    year around   Anemia    Anxiety    Arthritis    Asthma    Atrial fibrillation (HCC)    Bipolar 1 disorder (HCC)    Borderline diabetes    Depression    Early cataract    right   Ectopic pregnancy    Essential hypertension 04/13/2023   Fatty liver    Headache    Heart murmur    Hyperlipidemia    Insomnia    Peripheral arterial disease (HCC)    Pneumonia    PUD (peptic ulcer disease)    Right knee pain 12-05-2015   per pt.  she fell out of her husband's truck and landed on her right knee   Shortness of breath dyspnea    Wears glasses    Wears partial dentures    Past Surgical History:  Procedure  Laterality Date   ABDOMINAL AORTAGRAM N/A 09/12/2012   Procedure: ABDOMINAL Ronny Flurry;  Surgeon: Fransisco Hertz, MD;  Location: [facility] CATH LAB;  Service: Cardiovascular;  Laterality: N/A;   AORTA - BILATERAL FEMORAL ARTERY BYPASS GRAFT N/A 10/23/2014   Procedure: AORTOBIFEMORAL BYPASS GRAFT;  Surgeon: Chuck Hint, MD;  Location: [facility] OR;  Service: Vascular;  Laterality: N/A;   BACK SURGERY     CESAREAN SECTION     COLONOSCOPY  01-14-2004   > 11 yr ago Dr Loreta Ave - normal - report in epic    DILATATION & CURETTAGE/HYSTEROSCOPY WITH MYOSURE N/A 10/15/2015   Procedure: DILATATION & CURETTAGE/HYSTEROSCOPY WITH MYOSURE;  Surgeon: Ok Edwards, MD;  Location: [facility] ORS;  Service: Gynecology;  Laterality: N/A;   ESOPHAGOGASTRODUODENOSCOPY  01-14-2004   gastritis- dr Loreta Ave- in epic already    [facility] HERNIA REPAIR N/A 08/01/2021   Procedure: INCISIONAL HERNIA REPAIR TIMES TWO, ONE WITH VENTRALEX ST HERNIA MESH, THE SECOND BY PRIMARY CLOSURE;  Surgeon: Lucretia Roers, MD;  Location: [facility] ORS;  Service: General;  Laterality: N/A;   MULTIPLE TOOTH EXTRACTIONS     NOSE SURGERY     NOSE SURGERY     SHOULDER ARTHROSCOPY WITH SUBACROMIAL DECOMPRESSION, ROTATOR CUFF REPAIR AND BICEP TENDON REPAIR Left 09/26/2019   Procedure: left shoulder arthroscopy, biceps release and tenodesis, mini open rotator cuff tear repair subscapularis and supraspinatus;  Surgeon: Cammy Copa, MD;  Location: [facility] OR;  Service: Orthopedics;  Laterality: Left;   SHOULDER SURGERY     rotator cuff repair    TONSILLECTOMY     TUBAL LIGATION     UPPER GASTROINTESTINAL ENDOSCOPY  01-14-2004   in epic- dr Loreta Ave- gastritis    Current Outpatient Medications on File Prior to Visit  Medication Sig Dispense Refill   albuterol (VENTOLIN HFA) 108 (90 Base) MCG/ACT inhaler INHALE 2 PUFFS INTO THE LUNGS EVERY 6 HOURS AS NEEDED FOR WHEEZE/SHORTNES OF BREATH 18 each 3   atorvastatin (LIPITOR) 80 MG tablet TAKE 1 TABLET BY MOUTH EVERY DAY 90  tablet 1   clonazePAM (KLONOPIN) 1 MG tablet Take 1 tablet (1 mg total) by mouth 3 (three) times daily as needed for anxiety. 90 tablet 0   cyanocobalamin (VITAMIN B12) 1000 MCG tablet Take 1 tablet (1,000 mcg total) by mouth daily. 30 tablet 11   cyclobenzaprine (FLEXERIL) 5 MG tablet Take 1 tablet (5 mg total) by mouth 2 (two) times daily. 60 tablet 0   diphenhydrAMINE (BENADRYL) 25 MG tablet Take 50 mg by mouth every 6 (  six) hours as needed for allergies.     EPIPEN 2-PAK 0.3 MG/0.3ML SOAJ injection USE AS DIRECTED (Patient taking differently: Inject 0.3 mg into the muscle as needed for anaphylaxis.) 2 Device 0   ezetimibe (ZETIA) 10 MG tablet Take 1 tablet (10 mg total) by mouth daily. 90 tablet 3   meloxicam (MOBIC) 15 MG tablet TAKE 1 TABLET (15 MG TOTAL) BY MOUTH DAILY. 90 tablet 3   metoprolol succinate (TOPROL-XL) 50 MG 24 hr tablet TAKE 1 TABLET BY MOUTH DAILY. TAKE WITH OR IMMEDIATELY FOLLOWING A MEAL. 90 tablet 0   No current facility-administered medications on file prior to visit.   Allergies  Allergen Reactions   Bee Pollen Swelling and Other (See Comments)    Also ticks    Ciprofloxacin Shortness Of Breath, Swelling and Rash   Bactrim [Sulfamethoxazole-Trimethoprim] Hives   Cephalosporins Hives   Doxycycline Hives   Tetracyclines & Related Hives   Other Itching    Nuts causes itching and hives not sure what type   Augmentin [Amoxicillin-Pot Clavulanate] Diarrhea   Codeine Nausea Only   Reflex Pain Relief [Menthol] Rash    Muscle Rub   Social History   Socioeconomic History   Marital status: Married    Spouse name: Loraine Leriche   Number of children: 2   Years of education: Not on file   Highest education level: Not on file  Occupational History   Not on file  Tobacco Use   Smoking status: Every Day    Current packs/day: 0.50    Average packs/day: 0.5 packs/day for 42.7 years (21.3 ttl pk-yrs)    Types: Cigarettes    Start date: 01/29/1981   Smokeless tobacco: Never   Vaping Use   Vaping status: Never Used  Substance and Sexual Activity   Alcohol use: Yes    Alcohol/week: 1.0 standard drink of alcohol    Types: 1 Shots of liquor per week    Comment: very occassionally on social occassions   Drug use: No   Sexual activity: Yes  Other Topics Concern   Not on file  Social History Narrative   Married 38 years in 2022.   2 daughters   4 grandsons, 1 granddaughter   Social Determinants of Health   Financial Resource Strain: Low Risk  (01/12/2023)   Overall Financial Resource Strain (CARDIA)    Difficulty of Paying Living Expenses: Not hard at all  Food Insecurity: No Food Insecurity (01/12/2023)   Hunger Vital Sign    Worried About Running Out of Food in the Last Year: Never true    Ran Out of Food in the Last Year: Never true  Transportation Needs: No Transportation Needs (01/12/2023)   PRAPARE - Administrator, Civil Service (Medical): No    Lack of Transportation (Non-Medical): No  Physical Activity: Insufficiently Active (01/12/2023)   Exercise Vital Sign    Days of Exercise per Week: 3 days    Minutes of Exercise per Session: 30 min  Stress: No Stress Concern Present (01/12/2023)   [employer] of Occupational Health - Occupational Stress Questionnaire    Feeling of Stress : Not at all  Social Connections: Moderately Integrated (01/12/2023)   Social Connection and Isolation Panel [NHANES]    Frequency of Communication with Friends and Family: More than three times a week    Frequency of Social Gatherings with Friends and Family: Three times a week    Attends Religious Services: 1 to 4 times per year  Active Member of Clubs or Organizations: No    Attends Banker Meetings: Never    Marital Status: Married  Catering manager Violence: Not At Risk (01/12/2023)   Humiliation, Afraid, Rape, and Kick questionnaire    Fear of Current or Ex-Partner: No    Emotionally Abused: No    Physically Abused: No    Sexually  Abused: No    Review of Systems  All other systems reviewed and are negative.      Objective:   Physical Exam Vitals reviewed.  Constitutional:      General: She is not in acute distress.    Appearance: Normal appearance. She is well-developed and normal weight. She is not ill-appearing, toxic-appearing or diaphoretic.  HENT:     Nose: Nose normal.     Mouth/Throat:     Mouth: Mucous membranes are dry.  Eyes:     Conjunctiva/sclera: Conjunctivae normal.  Cardiovascular:     Heart sounds: Normal heart sounds. No murmur heard.    No friction rub. No gallop.  Pulmonary:     Effort: Pulmonary effort is normal. No respiratory distress.     Breath sounds: Decreased air movement present. No stridor. Decreased breath sounds and wheezing present. No rhonchi or rales.  Abdominal:     General: A surgical scar is present. Bowel sounds are normal. There is no distension. There are no signs of injury.     Palpations: Abdomen is soft. There is no shifting dullness, hepatomegaly, splenomegaly or mass.     Tenderness: There is no abdominal tenderness. There is no rebound.     Hernia: No hernia is present.  Skin:    Findings: No rash.  Neurological:     Mental Status: She is alert. Mental status is at baseline.     Sensory: Sensation is intact. No sensory deficit.     Coordination: Coordination is intact. Coordination normal.     Gait: Gait is intact. Gait normal.     Deep Tendon Reflexes: Reflexes normal.        Assessment:   Bruit of right carotid artery - Plan: US Carotid Duplex Bilateral, CBC with Differential/Platelet, COMPLETE METABOLIC PANEL WITH GFR, Lipid panel  Memory loss  Essential hypertension  Pure hypercholesterolemia His blood pressure today is well-controlled.  I recommended that we discontinue hydrochlorothiazide and therefore we can discontinue Klor-Con.  I recommended that we replace that with losartan 50 mg a day.  On her exam today there is a right-sided  carotid bruit.  She has not been taking aspirin.  I recommended that she start aspirin 81 mg daily and I will schedule the patient for carotid Dopplers.  The memory loss has improved.  I believe the memory loss is likely due to stress compounded by medication.  We will continue Klonopin twice a day and I have encouraged the patient to use the Flexeril sparingly.  However I feel the patient can discontinue Aricept.

## 2023-10-01 LAB — CBC WITH DIFFERENTIAL/PLATELET
Absolute Lymphocytes: 1836 {cells}/uL (ref 850–3900)
Absolute Monocytes: 503 {cells}/uL (ref 200–950)
Basophils Absolute: 61 {cells}/uL (ref 0–200)
Basophils Relative: 0.9 %
Eosinophils Absolute: 88 {cells}/uL (ref 15–500)
Eosinophils Relative: 1.3 %
HCT: 41.3 % (ref 35.0–45.0)
Hemoglobin: 13.9 g/dL (ref 11.7–15.5)
MCH: 30.5 pg (ref 27.0–33.0)
MCHC: 33.7 g/dL (ref 32.0–36.0)
MCV: 90.8 fL (ref 80.0–100.0)
MPV: 10.8 fL (ref 7.5–12.5)
Monocytes Relative: 7.4 %
Neutro Abs: 4311 {cells}/uL (ref 1500–7800)
Neutrophils Relative %: 63.4 %
Platelets: 293 10*3/uL (ref 140–400)
RBC: 4.55 10*6/uL (ref 3.80–5.10)
RDW: 12.7 % (ref 11.0–15.0)
Total Lymphocyte: 27 %
WBC: 6.8 10*3/uL (ref 3.8–10.8)

## 2023-10-01 LAB — COMPLETE METABOLIC PANEL WITH GFR
AG Ratio: 1.6 (calc) (ref 1.0–2.5)
ALT: 17 U/L (ref 6–29)
AST: 21 U/L (ref 10–35)
Albumin: 4.1 g/dL (ref 3.6–5.1)
Alkaline phosphatase (APISO): 79 U/L (ref 37–153)
BUN: 13 mg/dL (ref 7–25)
CO2: 29 mmol/L (ref 20–32)
Calcium: 9.3 mg/dL (ref 8.6–10.4)
Chloride: 106 mmol/L (ref 98–110)
Creat: 0.79 mg/dL (ref 0.50–1.03)
Globulin: 2.5 g/dL (ref 1.9–3.7)
Glucose, Bld: 110 mg/dL — ABNORMAL HIGH (ref 65–99)
Potassium: 4 mmol/L (ref 3.5–5.3)
Sodium: 143 mmol/L (ref 135–146)
Total Bilirubin: 0.4 mg/dL (ref 0.2–1.2)
Total Protein: 6.6 g/dL (ref 6.1–8.1)
eGFR: 86 mL/min/{1.73_m2} (ref >=60)

## 2023-10-01 LAB — LIPID PANEL
Cholesterol: 193 mg/dL (ref <200)
HDL: 40 mg/dL — ABNORMAL LOW (ref >=50)
LDL Cholesterol (Calc): 122 mg/dL — ABNORMAL HIGH
Non-HDL Cholesterol (Calc): 153 mg/dL — ABNORMAL HIGH (ref <130)
Total CHOL/HDL Ratio: 4.8 (calc) (ref <5.0)
Triglycerides: 194 mg/dL — ABNORMAL HIGH (ref <150)

## 2023-10-04 ENCOUNTER — Other Ambulatory Visit: Payer: Self-pay | Admitting: Family Medicine

## 2023-10-04 ENCOUNTER — Telehealth: Payer: Self-pay

## 2023-10-04 MED ORDER — CLONAZEPAM 1 MG PO TABS: 1.0000 mg | ORAL_TABLET | Freq: Three times a day (TID) | ORAL | 0 refills | Status: DC | PRN

## 2023-10-04 NOTE — Telephone Encounter (Signed)
Copied from CRM [id number]. Topic: Clinical - Medication Refill >> Oct 04, 2023  3:46 PM Eunice Blase wrote: Most Recent Primary Care Visit:  Provider: Lynnea Ferrier T  Department: [facility]  Visit Type: OFFICE VISIT  Date: 09/30/2023  clonazePAM (KLONOPIN) 1 MG tablet Medication:   Has the patient contacted their pharmacy? Yes (Agent: If no, request that the patient contact the pharmacy for the refill. If patient does not wish to contact the pharmacy document the reason why and proceed with request.) (Agent: If yes, when and what did the pharmacy advise?)  Is this the correct pharmacy for this prescription? Yes If no, delete pharmacy and type the correct one.  This is the patient's preferred pharmacy:  [id number] [place], [place] - [address] [place] [address] Phone: [phone] Fax: [phone]   Has the prescription been filled recently? Yes  Is the patient out of the medication? Yes  Has the patient been seen for an appointment in the last year OR does the patient have an upcoming appointment? Yes  Can we respond through MyChart? No  Agent: Please be advised that Rx refills may take up to 3 business days. We ask that you follow-up with your pharmacy.

## 2023-10-12 ENCOUNTER — Telehealth: Payer: Self-pay

## 2023-10-12 NOTE — Telephone Encounter (Signed)
Prescription Request  10/12/2023  LOV: 09/30/23  What is the name of the medication or equipment? donepezil (ARICEPT) 5 MG tablet [[id number]]  DISCONTINUED   Have you contacted your pharmacy to request a refill? Yes   Which pharmacy would you like this sent to?  [id number] [place], [place] - [address] [place] [address] Phone: [phone] Fax: [phone]    Patient notified that their request is being sent to the clinical staff for review and that they should receive a response within 2 business days.   Please advise at [facility] [phone]

## 2023-10-13 ENCOUNTER — Telehealth: Payer: Self-pay | Admitting: Family Medicine

## 2023-10-13 NOTE — Telephone Encounter (Signed)
Prescription Request  10/13/2023  LOV: 09/30/2023  What is the name of the medication or equipment?   cyclobenzaprine (FLEXERIL) 5 MG tablet  **90 day script requested**  Have you contacted your pharmacy to request a refill? Yes   Which pharmacy would you like this sent to?  [id number] [place], [place] - [address] [place] [address] Phone: [phone] Fax: [phone]    Patient notified that their request is being sent to the clinical staff for review and that they should receive a response within 2 business days.   Please advise pharmacist.

## 2023-10-14 ENCOUNTER — Other Ambulatory Visit: Payer: Self-pay | Admitting: Family Medicine

## 2023-10-14 MED ORDER — CYCLOBENZAPRINE HCL 5 MG PO TABS: 5.0000 mg | ORAL_TABLET | Freq: Two times a day (BID) | ORAL | 0 refills | Status: DC

## 2023-10-18 ENCOUNTER — Inpatient Hospital Stay: Admission: RE | Admit: 2023-10-18 | Payer: Medicare PPO | Source: Ambulatory Visit

## 2023-10-18 ENCOUNTER — Telehealth: Payer: Self-pay

## 2023-10-18 NOTE — Telephone Encounter (Signed)
Prescription Request  10/18/2023  LOV: 04/15/23  What is the name of the medication or equipment? traMADol (ULTRAM) 50 MG tablet [[id number]]  ENDED   Have you contacted your pharmacy to request a refill? Yes   Which pharmacy would you like this sent to?  [id number] [place], [place] - [address] [place] [address] Phone: [phone] Fax: [phone]    Patient notified that their request is being sent to the clinical staff for review and that they should receive a response within 2 business days.   Please advise at [facility] [phone]

## 2023-10-28 ENCOUNTER — Other Ambulatory Visit: Payer: Self-pay | Admitting: Family Medicine

## 2023-10-29 NOTE — Telephone Encounter (Signed)
Requested Prescriptions  Pending Prescriptions Disp Refills   albuterol (VENTOLIN HFA) 108 (90 Base) MCG/ACT inhaler [Pharmacy Med Name: ALBUTEROL HFA (VENTOLIN) INH] 18 each 1    Sig: INHALE 2 PUFFS INTO THE LUNGS EVERY 6 HOURS AS NEEDED FOR WHEEZE/SHORTNES OF BREATH     Pulmonology:  Beta Agonists 2 Failed - 10/28/2023 10:00 PM      Failed - Valid encounter within last 12 months    Recent Outpatient Visits           1 year ago Acute cystitis with hematuria   [facility] Valentino Nose, NP   2 years ago General medical exam   [facility] Donita Brooks, MD   2 years ago Acute midline low back pain with right-sided sciatica   [facility] Donita Brooks, MD   2 years ago Ventral hernia without obstruction or gangrene   [facility] Donita Brooks, MD   3 years ago Rib pain on left side   [facility] Pickard, Priscille Heidelberg, MD       Future Appointments             In 5 months Pickard, Priscille Heidelberg, MD [facility], [facility]            Passed - Last BP in normal range    BP Readings from Last 1 Encounters:  09/30/23 124/72         Passed - Last Heart Rate in normal range    Pulse Readings from Last 1 Encounters:  09/30/23 99

## 2023-11-02 ENCOUNTER — Telehealth: Payer: Self-pay

## 2023-11-02 NOTE — Telephone Encounter (Signed)
Prescription Request  11/02/2023  LOV: 09/30/23 What is the name of the medication or equipment? donepezil (ARICEPT) 5 MG tablet [[id number]]  DISCONTINUED   Have you contacted your pharmacy to request a refill? Yes   Which pharmacy would you like this sent to?  [id number] [place], [place] - [address] [place] [address] Phone: [phone] Fax: [phone]    Patient notified that their request is being sent to the clinical staff for review and that they should receive a response within 2 business days.   Please advise at [facility] [phone]

## 2023-11-11 ENCOUNTER — Other Ambulatory Visit: Payer: Self-pay | Admitting: Family Medicine

## 2023-11-11 ENCOUNTER — Other Ambulatory Visit: Payer: Self-pay

## 2023-11-11 NOTE — Telephone Encounter (Signed)
Requested medication (s) are due for refill today- yes  Requested medication (s) are on the active medication list -yes  Future visit scheduled -yes  Last refill: 10/14/23 #60  Notes to clinic: non delegated Rx  Requested Prescriptions  Pending Prescriptions Disp Refills   cyclobenzaprine (FLEXERIL) 5 MG tablet 60 tablet 0    Sig: Take 1 tablet (5 mg total) by mouth 2 (two) times daily.     Not Delegated - Analgesics:  Muscle Relaxants Failed - 11/11/2023 12:42 PM      Failed - This refill cannot be delegated      Failed - Valid encounter within last 6 months    Recent Outpatient Visits           1 year ago Acute cystitis with hematuria   [facility] Valentino Nose, NP   2 years ago General medical exam   [facility] Donita Brooks, MD   2 years ago Acute midline low back pain with right-sided sciatica   [facility] Donita Brooks, MD   2 years ago Ventral hernia without obstruction or gangrene   [facility] Donita Brooks, MD   3 years ago Rib pain on left side   [facility] Pickard, Priscille Heidelberg, MD       Future Appointments             In 4 months Pickard, Priscille Heidelberg, MD [facility], [facility]               Requested Prescriptions  Pending Prescriptions Disp Refills   cyclobenzaprine (FLEXERIL) 5 MG tablet 60 tablet 0    Sig: Take 1 tablet (5 mg total) by mouth 2 (two) times daily.     Not Delegated - Analgesics:  Muscle Relaxants Failed - 11/11/2023 12:42 PM      Failed - This refill cannot be delegated      Failed - Valid encounter within last 6 months    Recent Outpatient Visits           1 year ago Acute cystitis with hematuria   [facility] Valentino Nose, NP   2 years ago General medical exam   [facility] Donita Brooks, MD   2 years ago Acute midline low back pain with right-sided  sciatica   [facility] Donita Brooks, MD   2 years ago Ventral hernia without obstruction or gangrene   [facility] Donita Brooks, MD   3 years ago Rib pain on left side   [facility] Pickard, Priscille Heidelberg, MD       Future Appointments             In 4 months Pickard, Priscille Heidelberg, MD [facility], [facility]

## 2023-11-11 NOTE — Telephone Encounter (Signed)
Prescription Request  11/11/2023  LOV: 09/30/23  What is the name of the medication or equipment? cyclobenzaprine (FLEXERIL) 5 MG tablet [[id number]]   Have you contacted your pharmacy to request a refill? Yes   Which pharmacy would you like this sent to?  [id number] [place], [place] - [address] [place] [address] Phone: [phone] Fax: [phone]    Patient notified that their request is being sent to the clinical staff for review and that they should receive a response within 2 business days.   Please advise at [facility] [phone]

## 2023-11-11 NOTE — Telephone Encounter (Signed)
Requested medication (s) are due for refill today: Yes  Requested medication (s) are on the active medication list: Yes  Last refill:  10/04/23  Future visit scheduled: Yes  Notes to clinic:  Unable to refill per protocol, cannot delegate.      Requested Prescriptions  Pending Prescriptions Disp Refills   clonazePAM (KLONOPIN) 1 MG tablet [Pharmacy Med Name: CLONAZEPAM 1 MG TABLET] 90 tablet 0    Sig: TAKE 1 TABLET BY MOUTH 3 TIMES DAILY AS NEEDED FOR ANXIETY.     Not Delegated - Psychiatry: Anxiolytics/Hypnotics 2 Failed - 11/11/2023  1:57 PM      Failed - This refill cannot be delegated      Failed - Urine Drug Screen completed in last 360 days      Failed - Valid encounter within last 6 months    Recent Outpatient Visits           1 year ago Acute cystitis with hematuria   [facility] Valentino Nose, NP   2 years ago General medical exam   [facility] Donita Brooks, MD   2 years ago Acute midline low back pain with right-sided sciatica   [facility] Donita Brooks, MD   2 years ago Ventral hernia without obstruction or gangrene   [facility] Donita Brooks, MD   3 years ago Rib pain on left side   [facility] Pickard, Priscille Heidelberg, MD       Future Appointments             In 4 months Pickard, Priscille Heidelberg, MD [facility], [facility]            Passed - Patient is not pregnant

## 2023-11-26 MED ORDER — CLONAZEPAM 1 MG PO TABS: 1.0000 mg | ORAL_TABLET | Freq: Three times a day (TID) | ORAL | 0 refills | Status: DC | PRN

## 2023-11-26 NOTE — Telephone Encounter (Signed)
 Copied from CRM [id number]. Topic: Clinical - Medication Refill >> Nov 26, 2023  3:32 PM Montie POUR wrote: Most Recent Primary Care Visit:  Provider: DUANNE LOWERS T  Department: [facility]  Visit Type: OFFICE VISIT  Date: 09/30/2023  Medication: clonazePAM  (KLONOPIN ) 1 MG tablet [[id number]]  Has the patient contacted their pharmacy? No (Agent: If no, request that the patient contact the pharmacy for the refill. If patient does not wish to contact the pharmacy document the reason why and proceed with request.) (Agent: If yes, when and what did the pharmacy advise?)  Is this the correct pharmacy for this prescription? Yes [facility] If no, delete pharmacy and type the correct one.  This is the patient's preferred pharmacy:  [id number] [place],  - [address] [facility] [address] [place] [address] Phone: [phone] Fax: [phone]   Has the prescription been filled recently? No  Is the patient out of the medication? Yes - her father passed away and that is why she is late calling in for med.   Has the patient been seen for an appointment in the last year OR does the patient have an upcoming appointment? Yes  Can we respond through MyChart? Yes   Agent: Please be advised that Rx refills may take up to 3 business days. We ask that you follow-up with your pharmacy.

## 2023-12-13 ENCOUNTER — Telehealth: Payer: Self-pay

## 2023-12-13 NOTE — Telephone Encounter (Signed)
Prescription Request  12/13/2023  LOV: 09/30/23 What is the name of the medication or equipment? cyclobenzaprine (FLEXERIL) 5 MG tablet [[id number]]   Have you contacted your pharmacy to request a refill? Yes   Which pharmacy would you like this sent to?  [id number] [place], [place] - [address] [facility] [address] [facility] [address] [place] [address] Phone: [phone] Fax: [phone]    Patient notified that their request is being sent to the clinical staff for review and that they should receive a response within 2 business days.   Please advise at [facility] [phone]

## 2023-12-14 ENCOUNTER — Other Ambulatory Visit: Payer: Self-pay | Admitting: Family Medicine

## 2023-12-14 MED ORDER — CYCLOBENZAPRINE HCL 5 MG PO TABS: 5.0000 mg | ORAL_TABLET | Freq: Two times a day (BID) | ORAL | 0 refills | Status: DC

## 2023-12-16 ENCOUNTER — Telehealth: Payer: Self-pay | Admitting: Family Medicine

## 2023-12-16 NOTE — Telephone Encounter (Signed)
Prescription Request  12/16/2023  LOV: 09/30/2023  What is the name of the medication or equipment?  cyclobenzaprine (FLEXERIL) 5 MG tablet   donepezil (ARICEPT) 5 MG tablet [[id number]]  DISCONTINUED  **90 day scripts requested**  Have you contacted your pharmacy to request a refill? Yes   Which pharmacy would you like this sent to?  [id number] [place], [place] - [address] [facility] [address] [facility] [address] [place] [address] Phone: [phone] Fax: [phone]    Patient notified that their request is being sent to the clinical staff for review and that they should receive a response within 2 business days.   Please advise pharmacist.

## 2023-12-20 ENCOUNTER — Other Ambulatory Visit: Payer: Self-pay | Admitting: Family Medicine

## 2023-12-20 MED ORDER — CYCLOBENZAPRINE HCL 5 MG PO TABS: 5.0000 mg | ORAL_TABLET | Freq: Two times a day (BID) | ORAL | 0 refills | Status: DC

## 2023-12-20 NOTE — Telephone Encounter (Signed)
Received new script to request refill of   cyclobenzaprine (FLEXERIL) 10 MG tablet [[id number]]  DISCONTINUED   Pharmacy:   [id number] [place], [place] - [address] [facility] [address] [facility] [address] [place] [address] Phone: [phone]  Fax: [phone] DEA #: [id number]   Please advise pharmacist.

## 2023-12-28 ENCOUNTER — Telehealth: Payer: Self-pay

## 2023-12-28 ENCOUNTER — Other Ambulatory Visit: Payer: Self-pay | Admitting: Family Medicine

## 2023-12-28 MED ORDER — CLONAZEPAM 1 MG PO TABS: 1.0000 mg | ORAL_TABLET | Freq: Three times a day (TID) | ORAL | 0 refills | Status: DC | PRN

## 2023-12-28 NOTE — Telephone Encounter (Signed)
 Copied from CRM [id number]. Topic: Clinical - Prescription Issue >> Dec 28, 2023  4:29 PM Graeme ORN wrote: Reason for CRM: Patient called states she was trying to open bottle to take her clonazePAM  (KLONOPIN ) 1 MG tablet and dropped the all in the sink and they got wet and destroyed. States she lost the hold bottle due to bad shoulders. Would like to know if anything can be done. Thank You

## 2024-01-14 ENCOUNTER — Telehealth: Payer: Self-pay | Admitting: Family Medicine

## 2024-01-14 ENCOUNTER — Other Ambulatory Visit: Payer: Self-pay

## 2024-01-14 ENCOUNTER — Other Ambulatory Visit: Payer: Self-pay | Admitting: Family Medicine

## 2024-01-14 DIAGNOSIS — G8929 Other chronic pain: Secondary | ICD-10-CM

## 2024-01-14 DIAGNOSIS — M5431 Sciatica, right side: Secondary | ICD-10-CM

## 2024-01-14 DIAGNOSIS — R413 Other amnesia: Secondary | ICD-10-CM

## 2024-01-14 MED ORDER — DONEPEZIL HCL 5 MG PO TABS: 5.0000 mg | ORAL_TABLET | Freq: Every day | ORAL | 1 refills | Status: AC

## 2024-01-14 MED ORDER — CLONAZEPAM 1 MG PO TABS: 1.0000 mg | ORAL_TABLET | Freq: Three times a day (TID) | ORAL | 0 refills | Status: DC | PRN

## 2024-01-14 MED ORDER — CYCLOBENZAPRINE HCL 5 MG PO TABS: 5.0000 mg | ORAL_TABLET | Freq: Two times a day (BID) | ORAL | 0 refills | Status: DC

## 2024-01-14 NOTE — Telephone Encounter (Signed)
 Prescription Request  01/14/2024  LOV: 09/30/2023  What is the name of the medication or equipment?   donepezil (ARICEPT) 5 MG tablet [[id number]]  DISCONTINUED   cyclobenzaprine (FLEXERIL) 10 MG tablet [[id number]]  DISCONTINUED   Have you contacted your pharmacy to request a refill? Yes   Which pharmacy would you like this sent to?  [id number] [place], [place] - [address] [facility] [address] [facility] [address] [place] [address] Phone: [phone] Fax: [phone]    Patient notified that their request is being sent to the clinical staff for review and that they should receive a response within 2 business days.   Please advise pharmacist.

## 2024-02-03 ENCOUNTER — Telehealth: Payer: Self-pay | Admitting: Family Medicine

## 2024-02-03 NOTE — Telephone Encounter (Signed)
 Prescription Request  02/03/2024  LOV: 09/30/2023  What is the name of the medication or equipment?   cyclobenzaprine (FLEXERIL) 5 MG tablet   Have you contacted your pharmacy to request a refill? Yes   Which pharmacy would you like this sent to?  [id number] [place], [place] - [address] [facility] [address] [facility] [address] [place] [address] Phone: [phone] Fax: [phone]    Patient notified that their request is being sent to the clinical staff for review and that they should receive a response within 2 business days.   Please advise pharmacist.

## 2024-02-04 ENCOUNTER — Other Ambulatory Visit: Payer: Self-pay | Admitting: Family Medicine

## 2024-02-04 DIAGNOSIS — G8929 Other chronic pain: Secondary | ICD-10-CM

## 2024-02-04 DIAGNOSIS — M5431 Sciatica, right side: Secondary | ICD-10-CM

## 2024-02-04 MED ORDER — CYCLOBENZAPRINE HCL 5 MG PO TABS: 5.0000 mg | ORAL_TABLET | Freq: Two times a day (BID) | ORAL | 0 refills | Status: DC

## 2024-02-09 ENCOUNTER — Encounter: Payer: Self-pay | Admitting: Cardiology

## 2024-02-18 ENCOUNTER — Ambulatory Visit: Payer: Self-pay

## 2024-02-18 NOTE — Telephone Encounter (Signed)
 Chief Complaint: tick bite Symptoms: headache, redness, knot Frequency: Monday Pertinent Negatives: Patient denies fever Disposition: [] ED /[x] Urgent Care (no appt availability in office) / [x] Appointment(In office/virtual)/ []  [facility] [facility]/ [] Home Care/ [] Refused Recommended Disposition /[] [place] [facility]/ []  Follow-up with PCP Additional Notes: Patient States that Monday she removed a tick from her left upper thigh area and feels its getting worse. States that she now has severe itching, redness that has spread and a knot in the bit, states it was warm to touch and she now has a headache. States she taken tylenol for headache 5/10. Patient offered appt but stated she didn't want to drive over and they urgent care was closer to her.  States she will go to urgent care.    Copied from CRM [id number]. Topic: Clinical - Red Word Triage >> Feb 18, 2024 12:43 PM Carlatta H wrote: [facility] that prompted transfer to Nurse Triage: Tick bite on left leg upper thigh//Redness is traveling down her leg//Lump Reason for Disposition  [1] Red streak or red line AND [2] length > 2 inches (5 cm)  Answer Assessment - Initial Assessment Questions 1. ATTACHED:  "Is the tick still on the skin?"  (e.g., yes, no, unsure)     No removed     3. ONSET - TICK NOT STILL ATTACHED: "If the tick has been removed, how long do you think the tick was attached before you removed it?" (e.g., 5 hours, 2 days). "When was this?"     Monday 4. LOCATION: "Where is the tick bite located?" (e.g., arm, leg)     Upper thigh of left leg 5. TYPE of TICK: "Is it a wood tick or a deer tick?" (e.g., deer tick, wood tick; unsure)     Unsure, brown tick 6. SIZE of TICK: "How big is the tick?" (e.g., size of poppy seed, apple seed, watermelon seed; unsure) Note: Deer ticks can be the size of a poppy seed (nymph) or an apple seed (adult).       Watermelon seed size 7. ENGORGED: "Did the tick look flat or engorged (full,  swollen)?" (e.g., flat, engorged; unsure)     flat 8. OTHER SYMPTOMS: "Do you have any other symptoms?" (e.g., fever, rash, redness at bite area, red ring around bite)     Itching, redness, and a knot, was warm to touch, headache  Protocols used: Tick Bite-A-AH

## 2024-03-27 ENCOUNTER — Other Ambulatory Visit: Payer: Self-pay | Admitting: Family Medicine

## 2024-03-28 ENCOUNTER — Other Ambulatory Visit: Payer: Self-pay

## 2024-03-28 DIAGNOSIS — M5431 Sciatica, right side: Secondary | ICD-10-CM

## 2024-03-28 DIAGNOSIS — G8929 Other chronic pain: Secondary | ICD-10-CM

## 2024-03-28 MED ORDER — CYCLOBENZAPRINE HCL 5 MG PO TABS: 5.0000 mg | ORAL_TABLET | Freq: Two times a day (BID) | ORAL | 0 refills | Status: DC

## 2024-03-28 NOTE — Telephone Encounter (Signed)
 Prescription Request  03/28/2024  LOV: 09/30/23  What is the name of the medication or equipment? cyclobenzaprine  (FLEXERIL ) 5 MG tablet [[id number]]   Have you contacted your pharmacy to request a refill? Yes   Which pharmacy would you like this sent to?  [id number] [place], [place] - [address] [facility] [address] [place] [address] Phone: [phone] Fax: [phone]    Patient notified that their request is being sent to the clinical staff for review and that they should receive a response within 2 business days.   Please advise at [facility] [phone]

## 2024-03-28 NOTE — Telephone Encounter (Signed)
 Requested medication (s) are due for refill today: Yes  Requested medication (s) are on the active medication list: Yes  Last refill:  02/04/24  Future visit scheduled: Yes  Notes to clinic:  Unable to refill per protocol, cannot delegate.      Requested Prescriptions  Pending Prescriptions Disp Refills   cyclobenzaprine  (FLEXERIL ) 5 MG tablet 60 tablet 0    Sig: Take 1 tablet (5 mg total) by mouth 2 (two) times daily.     Not Delegated - Analgesics:  Muscle Relaxants Failed - 03/28/2024  1:52 PM      Failed - This refill cannot be delegated      Passed - Valid encounter within last 6 months    Recent Outpatient Visits           6 months ago Bruit of right carotid artery   [place] [facility] Austine Lefort, MD   11 months ago Acute cystitis with hematuria   [place] [facility] Jenelle Mis, FNP   11 months ago Acute cystitis with hematuria   [place] [facility] Jenelle Mis, FNP   1 year ago Memory loss   [place] [facility] Austine Lefort, MD   1 year ago Memory loss   [place] [facility] Pickard, Cisco Crest, MD       Future Appointments             In 2 days Pickard, Cisco Crest, MD [facility], [facility]

## 2024-03-29 NOTE — Telephone Encounter (Signed)
 Requested medication (s) are due for refill today: yes  Requested medication (s) are on the active medication list: yes  Last refill:  01/14/24 #90   Future visit scheduled: yes  Notes to clinic:  med not delegated to NT to RF   Requested Prescriptions  Pending Prescriptions Disp Refills   clonazePAM  (KLONOPIN ) 1 MG tablet [Pharmacy Med Name: CLONAZEPAM  1 MG TABLET] 90 tablet 0    Sig: TAKE 1 TABLET BY MOUTH 3 TIMES DAILY AS NEEDED FOR ANXIETY.     Not Delegated - Psychiatry: Anxiolytics/Hypnotics 2 Failed - 03/29/2024 11:28 AM      Failed - This refill cannot be delegated      Failed - Urine Drug Screen completed in last 360 days      Failed - Valid encounter within last 6 months    Recent Outpatient Visits           6 months ago Bruit of right carotid artery   [place] [facility] Austine Lefort, MD   11 months ago Acute cystitis with hematuria   [place] [facility] Jenelle Mis, FNP   11 months ago Acute cystitis with hematuria   [place] [facility] Jenelle Mis, FNP   1 year ago Memory loss   [place] [facility] Pickard, Cisco Crest, MD   1 year ago Memory loss   [place] [facility] Pickard, Cisco Crest, MD       Future Appointments             Tomorrow Pickard, Cisco Crest, MD [place] [facility], [facility]            Passed - Patient is not pregnant

## 2024-03-30 ENCOUNTER — Ambulatory Visit: Payer: Medicare PPO | Admitting: Family Medicine

## 2024-04-05 ENCOUNTER — Other Ambulatory Visit: Payer: Self-pay | Admitting: Family Medicine

## 2024-04-05 DIAGNOSIS — G8929 Other chronic pain: Secondary | ICD-10-CM

## 2024-04-05 DIAGNOSIS — M5431 Sciatica, right side: Secondary | ICD-10-CM

## 2024-04-05 NOTE — Telephone Encounter (Signed)
 Copied from CRM [id number]. Topic: Clinical - Medication Refill >> Apr 05, 2024  3:42 PM Rosaria Common wrote: Medication: cyclobenzaprine  (FLEXERIL ) 5 MG tablet  Has the patient contacted their pharmacy? Yes (Agent: If no, request that the patient contact the pharmacy for the refill. If patient does not wish to contact the pharmacy document the reason why and proceed with request.) (Agent: If yes, when and what did the pharmacy advise?)  This is the patient's preferred pharmacy:  [id number] [place], [place] - [address] [facility] [address] [place] [address] Phone: [phone] Fax: [phone]  Is this the correct pharmacy for this prescription? Yes If no, delete pharmacy and type the correct one.   Has the prescription been filled recently? No  Is the patient out of the medication? Yes  Has the patient been seen for an appointment in the last year OR does the patient have an upcoming appointment? Yes  Can we respond through MyChart? No  Agent: Please be advised that Rx refills may take up to 3 business days. We ask that you follow-up with your pharmacy.

## 2024-04-06 ENCOUNTER — Encounter: Payer: Self-pay | Admitting: Family Medicine

## 2024-04-06 ENCOUNTER — Inpatient Hospital Stay: Admission: RE | Admit: 2024-04-06 | Source: Ambulatory Visit

## 2024-04-06 NOTE — Telephone Encounter (Signed)
 Requested medications are due for refill today.  no  Requested medications are on the active medications list.  yes  Last refill. 03/28/2024 #60 0 rf  Future visit scheduled.   no  Notes to clinic.  Refill refusal not delegated. Medication was recently refilled. Called pt to see if she had picked up recent refill - no answer.    Requested Prescriptions  Pending Prescriptions Disp Refills   cyclobenzaprine  (FLEXERIL ) 5 MG tablet 60 tablet 0    Sig: Take 1 tablet (5 mg total) by mouth 2 (two) times daily.     Not Delegated - Analgesics:  Muscle Relaxants Failed - 04/06/2024  5:39 PM      Failed - This refill cannot be delegated      Failed - Valid encounter within last 6 months    Recent Outpatient Visits           6 months ago Bruit of right carotid artery   [place] [facility] Medicine Austine Lefort, MD   11 months ago Acute cystitis with hematuria   [place] [facility] Jenelle Mis, FNP   11 months ago Acute cystitis with hematuria   [place] [facility] Jenelle Mis, FNP   1 year ago Memory loss   [place] [facility] Austine Lefort, MD   1 year ago Memory loss   [place] [facility] Pickard, Cisco Crest, MD

## 2024-04-06 NOTE — Telephone Encounter (Signed)
 Called pt. Rx was refilled 03/28/2024. I wanted to see if pt had picked up that refill. No answer.

## 2024-04-07 ENCOUNTER — Telehealth: Payer: Self-pay

## 2024-04-07 MED ORDER — CYCLOBENZAPRINE HCL 5 MG PO TABS: 5.0000 mg | ORAL_TABLET | Freq: Two times a day (BID) | ORAL | 0 refills | Status: DC

## 2024-04-07 NOTE — Telephone Encounter (Signed)
 Error

## 2024-04-11 ENCOUNTER — Telehealth: Payer: Self-pay | Admitting: Family Medicine

## 2024-04-11 NOTE — Telephone Encounter (Signed)
 Prescription Request  04/11/2024  LOV: 09/30/2023  What is the name of the medication or equipment? Potassium cl er 20 meq tab mcr   Have you contacted your pharmacy to request a refill? Yes   Which pharmacy would you like this sent to?  [id number] [place], [place] - [address] [facility] [address] [place] [address] Phone: [phone] Fax: [phone]    Patient notified that their request is being sent to the clinical staff for review and that they should receive a response within 2 business days.   Please advise at [facility] [phone]

## 2024-04-12 ENCOUNTER — Telehealth: Payer: Self-pay

## 2024-04-12 ENCOUNTER — Other Ambulatory Visit: Payer: Self-pay

## 2024-04-12 NOTE — Telephone Encounter (Signed)
 Copied from CRM [id number]. Topic: Clinical - Prescription Issue >> Apr 11, 2024  5:18 PM Felicia [name] wrote: Reason for CRM: The patient has been directed by their pharmacy to contact their PCP and request approval for refill of the patient's prescription of clonazePAM  (KLONOPIN ) 1 MG tablet [[id number]]   Please contact the patient's pharmacy when possible

## 2024-04-13 ENCOUNTER — Other Ambulatory Visit: Payer: Self-pay | Admitting: Family Medicine

## 2024-05-15 ENCOUNTER — Telehealth: Payer: Self-pay

## 2024-05-15 NOTE — Telephone Encounter (Addendum)
 Prescription Request  05/15/2024  LOV: 09/30/23  What is the name of the medication or equipment? potassium chloride  SA (KLOR-CON  M) 20 MEQ tablet [[id number]]   Have you contacted your pharmacy to request a refill? Yes   Which pharmacy would you like this sent to?  [id number] [place], [place] - [address] [facility] [address] [place] [address] Phone: [phone] Fax: [phone]    Prescription Request  05/15/2024  LOV: 09/30/23  What is the name of the medication or equipment? cyclobenzaprine  (FLEXERIL ) 5 MG tablet [[id number]]   Have you contacted your pharmacy to request a refill? Yes   Which pharmacy would you like this sent to?  [id number] [place], [place] - [address] [facility] [address] [place] [address] Phone: [phone] Fax: [phone]    Patient notified that their request is being sent to the clinical staff for review and that they should receive a response within 2 business days.   Please advise at [facility] [phone]   Patient notified that their request is being sent to the clinical staff for review and that they should receive a response within 2 business days.   Please advise at [facility] [phone]

## 2024-05-17 ENCOUNTER — Telehealth: Payer: Self-pay | Admitting: Family Medicine

## 2024-05-17 NOTE — Telephone Encounter (Signed)
 Med refill appointment scheduled for 06/02/24 (first available in provider's schedule). Advised patient she's been placed on the wait list for a sooner appointment.

## 2024-05-17 NOTE — Telephone Encounter (Signed)
 Appt scheduled for 06/02/24.

## 2024-05-17 NOTE — Telephone Encounter (Signed)
 Prescription Request  05/17/2024  LOV: 09/30/2023  What is the name of the medication or equipment?   cyclobenzaprine  (FLEXERIL ) 5 MG tablet [[id number]]   potassium chloride  SA (KLOR-CON  M) 20 MEQ tablet [[id number]]   **90 day supplies requested** Have you contacted your pharmacy to request a refill? Yes   Which pharmacy would you like this sent to?  [id number] [place], [place] - [address] [facility] [address] [place] [address] Phone: [phone] Fax: [phone]    Patient notified that their request is being sent to the clinical staff for review and that they should receive a response within 2 business days.   Please advise pharmacist.

## 2024-06-02 ENCOUNTER — Ambulatory Visit: Admitting: Family Medicine

## 2024-06-02 ENCOUNTER — Encounter: Payer: Self-pay | Admitting: Family Medicine

## 2024-06-02 VITALS — BP 120/80 | HR 84 | Temp 98.1°F | Ht <= 58 in | Wt 129.6 lb

## 2024-06-02 DIAGNOSIS — R413 Other amnesia: Secondary | ICD-10-CM | POA: Diagnosis not present

## 2024-06-02 DIAGNOSIS — E7800 Pure hypercholesterolemia, unspecified: Secondary | ICD-10-CM | POA: Diagnosis not present

## 2024-06-02 DIAGNOSIS — F411 Generalized anxiety disorder: Secondary | ICD-10-CM | POA: Diagnosis not present

## 2024-06-02 DIAGNOSIS — I10 Essential (primary) hypertension: Secondary | ICD-10-CM | POA: Diagnosis not present

## 2024-06-02 MED ORDER — CLONAZEPAM 1 MG PO TABS: 1.0000 mg | ORAL_TABLET | Freq: Two times a day (BID) | ORAL | 3 refills | Status: DC | PRN

## 2024-06-02 MED ORDER — METOPROLOL SUCCINATE ER 50 MG PO TB24: 50.0000 mg | ORAL_TABLET | Freq: Every day | ORAL | 0 refills | Status: DC

## 2024-06-02 MED ORDER — MELOXICAM 15 MG PO TABS: 15.0000 mg | ORAL_TABLET | Freq: Every day | ORAL | 3 refills | Status: AC

## 2024-06-02 NOTE — Progress Notes (Signed)
 Subjective:     Patient ID: Felicia [name], female   DOB: 12-19-1965, 58 y.o.   MRN: [id number] 02/15/23 Patient presents today with her husband.  They are both concerned over short-term memory loss.  Husband states that they will have the same conversation repeatedly without the patient being able to remember.  He will have to answer the same question multiple times as though the patient is unable to commit this to her short-term memory.  She is also leaving on appliances such as stove.  She left her dog outside last night.  She took the dog out to use the bathroom but then left it outside and forgot to bring it back in.  She is not getting lost driving however she is isolated at home caring for her elderly father and she very seldom interacts with other people.  Her husband thinks that she may be forgetting and taking her medication inappropriately by accident.  They deny any strokelike symptoms.  I performed a Mini-Mental status exam today.  The patient scored 27.  She got 1 incorrect on serial sevens.  She was unable to remember today's date.  She also could only remember 2 out of 3 objects on recall.  At that time, my plan was:  Patient certainly has short-term memory loss..  First I recommended we reduce her medications which could be contributing.  Discontinue Flexeril .  Reduce tramadol  and Klonopin  by 50% and recheck the patient in 2 weeks to see if there is any improvement.  Strongly recommended we try to wean her off all centrally acting medication.  Check a B12 level as well as a TSH and also obtain an MRI to evaluate for any vascular causes of dementia.  03/01/23 Patient's husband states that she still taking medication.  She did not stop the Flexeril .  She did reduce Klonopin  slightly.  She is still taking tramadol .  I am concerned that she did not change the medication after we discussed that.  However he states that the memory loss seems to be getting worse.  MRI has not been scheduled.  TSH was  normal.  B12 was borderline low.  We called the patient and told her to start taking B12 but she never started the B12.  At that time my plan was: Proceed with MRI to determine if there is vascular dementia.  Begin Aricept  5 mg daily.  Discontinue tramadol .  Reduce Flexeril  to 5 mg twice daily.  Reduce Klonopin  to 0.5 mg twice daily.  Strongly encouraged the patient to try to cut back on these medications as they can affect her memory.  Recheck in 6 months  09/30/23 Patient is doing much better today.  She states that her memory has improved considerably.  However it seems that this may be more likely due to medication stress than any dementia.  She is not taking Aricept .  Her father has moved out of her house and is now living with her brother.  There was a great deal of conflict between her husband and her father.  This was putting a tremendous amount of stress on the patient.  It was also causing stress in her marriage.  Since her father has moved out and the stress has improved, the patient states that her brief is doing better but she still taking Klonopin  twice a day.  She tried to use Flexeril  sparingly.  She does get cramps a lot at night for which she takes for current.  She is on Klor-Con  because  of the hydrochlorothiazide .  06/02/24  Patient is very tearful today.  She is battling depression since the death of her father.  She does relatively well in the morning.  Her husband goes to work around 2:00 in the afternoon.  However after he goes to work, she will typically go back to bed and sleep another 6 hours.  She reports feeling depressed.  She reports anhedonia.  She reports no desire to do anything.  She is not cleaning the house.  She has not been doing normal daily activities.  She feels extremely sad and continues to grieve over the loss of her father.  I have been concerned about memory loss.  He stated her memory and awaiting.  She denies any progression of her memory loss.  She is still  taking Klonopin  on a daily basis twice a day.  She is also taking Flexeril .  I again explained to the patient that I am concerned about polypharmacy affecting her memory.  I recommended that we discontinue Flexeril .  The patient states that she really needs the Klonopin  right now due to her increased stress level and anxiety.  Past Medical History:  Diagnosis Date   Allergy    year around   Anemia    Anxiety    Arthritis    Asthma    Atrial fibrillation (HCC)    Bipolar 1 disorder (HCC)    Borderline diabetes    Depression    Early cataract    right   Ectopic pregnancy    Essential hypertension 04/13/2023   Fatty liver    Headache    Heart murmur    Hyperlipidemia    Insomnia    Peripheral arterial disease (HCC)    Pneumonia    PUD (peptic ulcer disease)    Right knee pain 12-05-2015   per pt.  she fell out of her husband's truck and landed on her right knee   Shortness of breath dyspnea    Wears glasses    Wears partial dentures    Past Surgical History:  Procedure Laterality Date   ABDOMINAL AORTAGRAM N/A 09/12/2012   Procedure: ABDOMINAL EZELLA;  Surgeon: Redell LITTIE Door, MD;  Location[facility] CATH LAB;  Service: Cardiovascular;  Laterality: N/A;   AORTA - BILATERAL FEMORAL ARTERY BYPASS GRAFT N/A 10/23/2014   Procedure: AORTOBIFEMORAL BYPASS GRAFT;  Surgeon: Lonni GORMAN Blade, MD;  Location: [facility] OR;  Service: Vascular;  Laterality: N/A;   BACK SURGERY     CESAREAN SECTION     COLONOSCOPY  01-14-2004   > 11 yr ago Dr Kristie - normal - report in epic    DILATATION & CURETTAGE/HYSTEROSCOPY WITH MYOSURE N/A 10/15/2015   Procedure: DILATATION & CURETTAGE/HYSTEROSCOPY WITH MYOSURE;  Surgeon: Curlee VEAR Guan, MD;  Location: [facility] ORS;  Service: Gynecology;  Laterality: N/A;   ESOPHAGOGASTRODUODENOSCOPY  01-14-2004   gastritis- dr kristie- in epic already    [facility] HERNIA REPAIR N/A 08/01/2021   Procedure: INCISIONAL HERNIA REPAIR TIMES TWO, ONE WITH VENTRAL[address] ST HERNIA MESH, THE  SECOND BY PRIMARY CLOSURE;  Surgeon: Kallie Manuelita BROCKS, MD;  Location: [facility] ORS;  Service: General;  Laterality: N/A;   MULTIPLE TOOTH EXTRACTIONS     NOSE SURGERY     NOSE SURGERY     SHOULDER ARTHROSCOPY WITH SUBACROMIAL DECOMPRESSION, ROTATOR CUFF REPAIR AND BICEP TENDON REPAIR Left 09/26/2019   Procedure: left shoulder arthroscopy, biceps release and tenodesis, mini open rotator cuff tear repair subscapularis and supraspinatus;  Surgeon: Addie Cordella Hamilton, MD;  Location: [facility] OR;  Service: Orthopedics;  Laterality: Left;   SHOULDER SURGERY     rotator cuff repair    TONSILLECTOMY     TUBAL LIGATION     UPPER GASTROINTESTINAL ENDOSCOPY  01-14-2004   in epic- dr kristie- gastritis    Current Outpatient Medications on File Prior to Visit  Medication Sig Dispense Refill   albuterol  (VENTOLIN  HFA) 108 (90 Base) MCG/ACT inhaler INHALE 2 PUFFS INTO THE LUNGS EVERY 6 HOURS AS NEEDED FOR WHEEZE/SHORTNES OF BREATH 18 each 1   atorvastatin  (LIPITOR) 80 MG tablet TAKE 1 TABLET BY MOUTH EVERY DAY 90 tablet 1   clonazePAM  (KLONOPIN ) 1 MG tablet Take 1 tablet (1 mg total) by mouth 3 (three) times daily as needed for anxiety. 90 tablet 0   clonazePAM  (KLONOPIN ) 1 MG tablet TAKE 1 TABLET BY MOUTH 3 TIMES DAILY AS NEEDED FOR ANXIETY. 90 tablet 0   cyanocobalamin (VITAMIN B12) 1000 MCG tablet Take 1 tablet (1,000 mcg total) by mouth daily. 30 tablet 11   cyclobenzaprine  (FLEXERIL ) 5 MG tablet Take 1 tablet (5 mg total) by mouth 2 (two) times daily. 60 tablet 0   diphenhydrAMINE  (BENADRYL ) 25 MG tablet Take 50 mg by mouth every 6 (six) hours as needed for allergies.     donepezil  (ARICEPT ) 5 MG tablet Take 1 tablet (5 mg total) by mouth at bedtime. 90 tablet 1   EPIPEN  2-PAK 0.3 MG/0.3ML SOAJ injection USE AS DIRECTED (Patient taking differently: Inject 0.3 mg into the muscle as needed for anaphylaxis.) 2 Device 0   ezetimibe  (ZETIA ) 10 MG tablet Take 1 tablet (10 mg total) by mouth daily. 90 tablet 3   losartan   (COZAAR ) 50 MG tablet Take 1 tablet (50 mg total) by mouth daily. Replaces hydrochlorothiazide  90 tablet 3   meloxicam  (MOBIC ) 15 MG tablet TAKE 1 TABLET (15 MG TOTAL) BY MOUTH DAILY. 90 tablet 3   metoprolol  succinate (TOPROL -XL) 50 MG 24 hr tablet TAKE 1 TABLET BY MOUTH DAILY. TAKE WITH OR IMMEDIATELY FOLLOWING A MEAL. 90 tablet 0   potassium chloride  SA (KLOR-CON  M) 20 MEQ tablet Take 40 mEq by mouth daily.     No current facility-administered medications on file prior to visit.   Allergies  Allergen Reactions   Bee Pollen Swelling and Other (See Comments)    Also ticks    Ciprofloxacin  Shortness Of Breath, Swelling and Rash   Bactrim  [Sulfamethoxazole -Trimethoprim ] Hives   Cephalosporins Hives   Doxycycline Hives   Tetracyclines & Related Hives   Other Itching    Nuts causes itching and hives not sure what type   Augmentin  [Amoxicillin -Pot Clavulanate] Diarrhea   Codeine  Nausea Only   Reflex Pain Relief [Menthol] Rash    Muscle Rub   Social History   Socioeconomic History   Marital status: Married    Spouse name: Oneil   Number of children: 2   Years of education: Not on file   Highest education level: Not on file  Occupational History   Not on file  Tobacco Use   Smoking status: Every Day    Current packs/day: 0.50    Average packs/day: 0.5 packs/day for 43.3 years (21.7 ttl pk-yrs)    Types: Cigarettes    Start date: 01/29/1981   Smokeless tobacco: Never  Vaping Use   Vaping status: Never Used  Substance and Sexual Activity   Alcohol use: Yes    Alcohol/week: 1.0 standard drink of alcohol    Types: 1 Shots of liquor per week  Comment: very occassionally on social occassions   Drug use: No   Sexual activity: Yes  Other Topics Concern   Not on file  Social History Narrative   Married 38 years in 2022.   2 daughters   4 grandsons, 1 granddaughter   Social Drivers of Corporate investment banker Strain: Low Risk  (01/12/2023)   Overall Financial Resource  Strain (CARDIA)    Difficulty of Paying Living Expenses: Not hard at all  Food Insecurity: No Food Insecurity (01/12/2023)   Hunger Vital Sign    Worried About Running Out of Food in the Last Year: Never true    Ran Out of Food in the Last Year: Never true  Transportation Needs: No Transportation Needs (01/12/2023)   PRAPARE - Administrator, Civil Service (Medical): No    Lack of Transportation (Non-Medical): No  Physical Activity: Insufficiently Active (01/12/2023)   Exercise Vital Sign    Days of Exercise per Week: 3 days    Minutes of Exercise per Session: 30 min  Stress: No Stress Concern Present (01/12/2023)   [employer] of Occupational Health - Occupational Stress Questionnaire    Feeling of Stress : Not at all  Social Connections: Moderately Integrated (01/12/2023)   Social Connection and Isolation Panel    Frequency of Communication with Friends and Family: More than three times a week    Frequency of Social Gatherings with Friends and Family: Three times a week    Attends Religious Services: 1 to 4 times per year    Active Member of Clubs or Organizations: No    Attends Banker Meetings: Never    Marital Status: Married  Catering manager Violence: Not At Risk (01/12/2023)   Humiliation, Afraid, Rape, and Kick questionnaire    Fear of Current or Ex-Partner: No    Emotionally Abused: No    Physically Abused: No    Sexually Abused: No    Review of Systems  All other systems reviewed and are negative.      Objective:   Physical Exam Vitals reviewed.  Constitutional:      General: She is not in acute distress.    Appearance: Normal appearance. She is well-developed and normal weight. She is not ill-appearing, toxic-appearing or diaphoretic.  HENT:     Nose: Nose normal.     Mouth/Throat:     Mouth: Mucous membranes are dry.  Eyes:     Conjunctiva/sclera: Conjunctivae normal.  Cardiovascular:     Heart sounds: Normal heart sounds. No  murmur heard.    No friction rub. No gallop.  Pulmonary:     Effort: Pulmonary effort is normal. No respiratory distress.     Breath sounds: Decreased air movement present. No stridor. Decreased breath sounds and wheezing present. No rhonchi or rales.  Abdominal:     General: A surgical scar is present. Bowel sounds are normal. There is no distension. There are no signs of injury.     Palpations: Abdomen is soft. There is no shifting dullness, hepatomegaly, splenomegaly or mass.     Tenderness: There is no abdominal tenderness. There is no rebound.     Hernia: No hernia is present.  Skin:    Findings: No rash.  Neurological:     Mental Status: She is alert. Mental status is at baseline.     Sensory: Sensation is intact. No sensory deficit.     Coordination: Coordination is intact. Coordination normal.     Gait: Gait is  intact. Gait normal.     Deep Tendon Reflexes: Reflexes normal.        Assessment:   Essential hypertension - Plan: CBC with Differential/Platelet, Comprehensive metabolic panel with GFR, Lipid panel  Pure hypercholesterolemia - Plan: CBC with Differential/Platelet, Comprehensive metabolic panel with GFR, Lipid panel  GAD (generalized anxiety disorder)  Memory loss Patient's blood pressure today is well-controlled.  I will check a CBC CMP and a lipid panel.  Given her history of peripheral artery disease and asymptomatic plaque of the carotid arteries, would likely proceed with cholesterol less than 70.  I will continue to write her Klonopin  that she uses for anxiety.  However due to concerns over memory loss I recommended discontinuation of Flexeril .  We discussed the new lab test to detect p-tau 217 as a screening method to determine if there is possible development of Alzheimer's disease.  The patient tries attest to the present time.

## 2024-06-03 LAB — LIPID PANEL
Cholesterol: 251 mg/dL — ABNORMAL HIGH (ref <200)
HDL: 43 mg/dL — ABNORMAL LOW (ref >=50)
LDL Cholesterol (Calc): 171 mg/dL — ABNORMAL HIGH
Non-HDL Cholesterol (Calc): 208 mg/dL — ABNORMAL HIGH (ref <130)
Total CHOL/HDL Ratio: 5.8 (calc) — ABNORMAL HIGH (ref <5.0)
Triglycerides: 201 mg/dL — ABNORMAL HIGH (ref <150)

## 2024-06-03 LAB — CBC WITH DIFFERENTIAL/PLATELET
Absolute Lymphocytes: 2647 {cells}/uL (ref 850–3900)
Absolute Monocytes: 476 {cells}/uL (ref 200–950)
Basophils Absolute: 47 {cells}/uL (ref 0–200)
Basophils Relative: 0.7 %
Eosinophils Absolute: 107 {cells}/uL (ref 15–500)
Eosinophils Relative: 1.6 %
HCT: 40.5 % (ref 35.0–45.0)
Hemoglobin: 13.2 g/dL (ref 11.7–15.5)
MCH: 29.9 pg (ref 27.0–33.0)
MCHC: 32.6 g/dL (ref 32.0–36.0)
MCV: 91.6 fL (ref 80.0–100.0)
MPV: 10.2 fL (ref 7.5–12.5)
Monocytes Relative: 7.1 %
Neutro Abs: 3424 {cells}/uL (ref 1500–7800)
Neutrophils Relative %: 51.1 %
Platelets: 277 Thousand/uL (ref 140–400)
RBC: 4.42 Million/uL (ref 3.80–5.10)
RDW: 13.1 % (ref 11.0–15.0)
Total Lymphocyte: 39.5 %
WBC: 6.7 Thousand/uL (ref 3.8–10.8)

## 2024-06-03 LAB — COMPREHENSIVE METABOLIC PANEL WITH GFR
AG Ratio: 1.6 (calc) (ref 1.0–2.5)
ALT: 13 U/L (ref 6–29)
AST: 15 U/L (ref 10–35)
Albumin: 4.1 g/dL (ref 3.6–5.1)
Alkaline phosphatase (APISO): 80 U/L (ref 37–153)
BUN: 19 mg/dL (ref 7–25)
CO2: 27 mmol/L (ref 20–32)
Calcium: 8.9 mg/dL (ref 8.6–10.4)
Chloride: 106 mmol/L (ref 98–110)
Creat: 0.93 mg/dL (ref 0.50–1.05)
Globulin: 2.5 g/dL (ref 1.9–3.7)
Glucose, Bld: 66 mg/dL (ref 65–99)
Potassium: 4.5 mmol/L (ref 3.5–5.3)
Sodium: 143 mmol/L (ref 135–146)
Total Bilirubin: 0.3 mg/dL (ref 0.2–1.2)
Total Protein: 6.6 g/dL (ref 6.1–8.1)
eGFR: 70 mL/min/1.73m2 (ref >=60)

## 2024-06-05 ENCOUNTER — Ambulatory Visit: Payer: Self-pay | Admitting: Family Medicine

## 2024-06-05 NOTE — Progress Notes (Signed)
 Pt. Notified by result letter sent to home address on file.

## 2024-06-16 ENCOUNTER — Telehealth: Payer: Self-pay | Admitting: Family Medicine

## 2024-06-16 ENCOUNTER — Other Ambulatory Visit: Payer: Self-pay

## 2024-06-16 DIAGNOSIS — G8929 Other chronic pain: Secondary | ICD-10-CM

## 2024-06-16 DIAGNOSIS — M5431 Sciatica, right side: Secondary | ICD-10-CM

## 2024-06-16 MED ORDER — POTASSIUM CHLORIDE CRYS ER 20 MEQ PO TBCR: 40.0000 meq | EXTENDED_RELEASE_TABLET | Freq: Every day | ORAL | 1 refills | Status: AC

## 2024-06-16 MED ORDER — CYCLOBENZAPRINE HCL 5 MG PO TABS: 5.0000 mg | ORAL_TABLET | Freq: Two times a day (BID) | ORAL | 0 refills | Status: DC

## 2024-06-16 MED ORDER — ATORVASTATIN CALCIUM 80 MG PO TABS
80.0000 mg | ORAL_TABLET | Freq: Every day | ORAL | 1 refills | Status: AC
Start: 2024-06-16 — End: ?

## 2024-06-16 NOTE — Telephone Encounter (Signed)
 Prescription Request  06/16/2024  LOV: 06/02/2024  What is the name of the medication or equipment?   cyclobenzaprine  (FLEXERIL ) 5 MG tablet   potassium chloride  SA (KLOR-CON  M) 20 MEQ tablet [[id number]]   atorvastatin  (LIPITOR) 80 MG tablet [[id number]]  **90 day scripts requested**  Have you contacted your pharmacy to request a refill? Yes   Which pharmacy would you like this sent to?  [id number] [address], [place] - [address] [place] [address] Phone: [phone] Fax: [phone]    Patient notified that their request is being sent to the clinical staff for review and that they should receive a response within 2 business days.   Please advise pharmacist.

## 2024-06-21 ENCOUNTER — Other Ambulatory Visit: Payer: Self-pay | Admitting: Family Medicine

## 2024-07-18 ENCOUNTER — Telehealth: Payer: Self-pay | Admitting: Family Medicine

## 2024-07-18 ENCOUNTER — Other Ambulatory Visit: Payer: Self-pay

## 2024-07-18 DIAGNOSIS — J441 Chronic obstructive pulmonary disease with (acute) exacerbation: Secondary | ICD-10-CM

## 2024-07-18 MED ORDER — ALBUTEROL SULFATE HFA 108 (90 BASE) MCG/ACT IN AERS: INHALATION_SPRAY | RESPIRATORY_TRACT | 2 refills | Status: AC

## 2024-07-18 NOTE — Telephone Encounter (Signed)
 Prescription Request  07/18/2024  LOV: 06/02/2024  What is the name of the medication or equipment?   albuterol  (VENTOLIN  HFA) 108 (90 Base) MCG/ACT inhaler   Have you contacted your pharmacy to request a refill? Yes   Which pharmacy would you like this sent to?  [id number] [address], [place] - [address] [place] [address] Phone: [phone] Fax: [phone]    Patient notified that their request is being sent to the clinical staff for review and that they should receive a response within 2 business days.   Please advise pharmacist.

## 2024-09-18 ENCOUNTER — Other Ambulatory Visit: Payer: Self-pay

## 2024-09-18 NOTE — Telephone Encounter (Signed)
 Prescription Request  09/18/2024  LOV: 06/02/24  What is the name of the medication or equipment? metoprolol  succinate (TOPROL -XL) 50 MG 24 hr tablet [[id number]]   Have you contacted your pharmacy to request a refill? Yes   Which pharmacy would you like this sent to?  [id number] [address], [place] - [address] [place] [address] Phone: [phone] Fax: [phone]    Patient notified that their request is being sent to the clinical staff for review and that they should receive a response within 2 business days.   Please advise at [facility] [phone]

## 2024-09-20 MED ORDER — METOPROLOL SUCCINATE ER 50 MG PO TB24: 50.0000 mg | ORAL_TABLET | Freq: Every day | ORAL | 0 refills | Status: AC

## 2024-09-20 NOTE — Telephone Encounter (Signed)
 Requested Prescriptions  Pending Prescriptions Disp Refills   metoprolol  succinate (TOPROL -XL) 50 MG 24 hr tablet 90 tablet 0    Sig: Take 1 tablet (50 mg total) by mouth daily. TAKE WITH OR IMMEDIATELY FOLLOWING A MEAL.     Cardiovascular:  Beta Blockers Passed - 09/20/2024 10:40 AM      Passed - Last BP in normal range    BP Readings from Last 1 Encounters:  06/02/24 120/80         Passed - Last Heart Rate in normal range    Pulse Readings from Last 1 Encounters:  06/02/24 84         Passed - Valid encounter within last 6 months    Recent Outpatient Visits           3 months ago Essential hypertension   [place] [facility] Duanne Butler DASEN, MD   11 months ago Bruit of right carotid artery   [place] [facility] Duanne Butler DASEN, MD   1 year ago Acute cystitis with hematuria   [place] [facility] Kayla Jeoffrey RAMAN, FNP   1 year ago Acute cystitis with hematuria   [place] [facility] Kayla Jeoffrey RAMAN, FNP   1 year ago Memory loss   [place] [facility] Pickard, Butler DASEN, MD

## 2024-09-26 ENCOUNTER — Other Ambulatory Visit: Payer: Self-pay | Admitting: Family Medicine

## 2024-09-26 DIAGNOSIS — G8929 Other chronic pain: Secondary | ICD-10-CM

## 2024-09-26 DIAGNOSIS — M5431 Sciatica, right side: Secondary | ICD-10-CM

## 2024-09-26 NOTE — Telephone Encounter (Unsigned)
 Copied from CRM #[id number]. Topic: Clinical - Medication Refill >> Sep 26, 2024  2:47 PM Darshell M wrote: Medication: cyclobenzaprine  (FLEXERIL ) 5 MG tablet   Has the patient contacted their pharmacy? Yes (Agent: If no, request that the patient contact the pharmacy for the refill. If patient does not wish to contact the pharmacy document the reason why and proceed with request.) (Agent: If yes, when and what did the pharmacy advise?)  This is the patient's preferred pharmacy:  [id number] [place], [place] - [address] [place] [address] Phone: [phone] Fax: [phone]  Is this the correct pharmacy for this prescription? Yes If no, delete pharmacy and type the correct one.   Has the prescription been filled recently? No  Is the patient out of the medication? Yes  Has the patient been seen for an appointment in the last year OR does the patient have an upcoming appointment? Yes  Can we respond through MyChart? No  Agent: Please be advised that Rx refills may take up to 3 business days. We ask that you follow-up with your pharmacy.

## 2024-09-28 ENCOUNTER — Ambulatory Visit: Payer: Self-pay

## 2024-09-28 NOTE — Telephone Encounter (Signed)
 Requested medication (s) are due for refill today: yes  Requested medication (s) are on the active medication list: yes  Last refill:  06/16/24  Future visit scheduled: no  Notes to clinic:  Unable to refill per protocol, cannot delegate.      Requested Prescriptions  Pending Prescriptions Disp Refills   cyclobenzaprine  (FLEXERIL ) 5 MG tablet 60 tablet 0    Sig: Take 1 tablet (5 mg total) by mouth 2 (two) times daily.     Not Delegated - Analgesics:  Muscle Relaxants Failed - 09/28/2024  1:29 PM      Failed - This refill cannot be delegated      Passed - Valid encounter within last 6 months    Recent Outpatient Visits           3 months ago Essential hypertension   [place] [facility] Duanne, Butler DASEN, MD   12 months ago Bruit of right carotid artery   [place] [facility] Duanne Butler DASEN, MD   1 year ago Acute cystitis with hematuria   [place] [facility] Kayla Jeoffrey RAMAN, FNP   1 year ago Acute cystitis with hematuria   [place] [facility] Kayla Jeoffrey RAMAN, FNP   1 year ago Memory loss    [facility] Pickard, Butler DASEN, MD

## 2024-09-28 NOTE — Telephone Encounter (Signed)
 FYI Only or Action Required?: FYI only for provider: appointment scheduled on 09/29/24.  Patient was last seen in primary care on 06/02/2024 by Duanne Butler DASEN, MD.  Called Nurse Triage reporting Pain.  Symptoms began several days ago.  Interventions attempted: Rest, hydration, or home remedies.  Symptoms are: stable.  Triage Disposition: Information or Advice Only Call  Patient/caregiver understands and will follow disposition?: Yes Reason for Disposition  Health information question, no triage required and triager able to answer question  Answer Assessment - Initial Assessment Questions Advised patient per protocol patient needs appointment as noted in chart, patient stated she spoke to someone yesterday who stated she does not need an appointment. Patient stated she is happy to come in for an appointment.   1. REASON FOR CALL: What is the main reason for your call? or How can I best help you?     Patient calling regards to refill on cyclobenzaprine  (FLEXERIL ) 5 MG tablet  2. SYMPTOMS : Do you have any symptoms?      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  Protocols used: Information Only Call - No Triage-A-AH  Copied from CRM [id number]. Topic: Clinical - Red Word Triage >> Sep 28, 2024  4:40 PM Alfonso ORN wrote: Red Word that prompted transfer to Nurse Triage: pt is waiting on muscle relaxant refill ( rx issue) and having pain unable to move around

## 2024-09-29 ENCOUNTER — Telehealth: Payer: Self-pay

## 2024-09-29 ENCOUNTER — Ambulatory Visit: Admitting: Family Medicine

## 2024-09-29 MED ORDER — CYCLOBENZAPRINE HCL 5 MG PO TABS: 5.0000 mg | ORAL_TABLET | Freq: Two times a day (BID) | ORAL | 0 refills | Status: AC

## 2024-09-29 NOTE — Telephone Encounter (Signed)
 Copied from CRM #[id number]. Topic: Clinical - Prescription Issue >> Sep 28, 2024  4:37 PM Alfonso ORN wrote: Reason for CRM: pt stated pharmacy needs response from pcp for muscle relaxant rx and wants to know why it wasn't being refilled. Advised pt per note that rx cannot be delegated . Please advise pt on next steps for getting refill

## 2024-10-05 ENCOUNTER — Ambulatory Visit: Admitting: Family Medicine

## 2024-12-13 ENCOUNTER — Telehealth: Payer: Self-pay | Admitting: Family Medicine

## 2024-12-13 NOTE — Telephone Encounter (Signed)
 Prescription Request  12/13/2024  LOV: 06/02/2024  What is the name of the medication or equipment?   clonazePAM  (KLONOPIN ) 1 MG tablet   Have you contacted your pharmacy to request a refill? Yes   Which pharmacy would you like this sent to?  [id number] [place], [place] - [address] [place] [address] Phone: [phone] Fax: [phone]    Patient notified that their request is being sent to the clinical staff for review and that they should receive a response within 2 business days.   Please advise pharmacist.

## 2024-12-14 ENCOUNTER — Other Ambulatory Visit: Payer: Self-pay | Admitting: Family Medicine

## 2024-12-14 MED ORDER — CLONAZEPAM 1 MG PO TABS: 1.0000 mg | ORAL_TABLET | Freq: Two times a day (BID) | ORAL | 3 refills | Status: AC | PRN
